# Patient Record
Sex: Male | Born: 1975
Health system: Southern US, Community
[De-identification: ages and names within clinical notes are randomized; demographics above are authoritative.]

## PROBLEM LIST (undated history)

## (undated) DIAGNOSIS — I2699 Other pulmonary embolism without acute cor pulmonale: Secondary | ICD-10-CM

## (undated) DIAGNOSIS — I82409 Acute embolism and thrombosis of unspecified deep veins of unspecified lower extremity: Secondary | ICD-10-CM

## (undated) DIAGNOSIS — M1990 Unspecified osteoarthritis, unspecified site: Secondary | ICD-10-CM

## (undated) HISTORY — PX: VENA CAVA FILTER PLACEMENT: SUR1032

## (undated) HISTORY — PX: FRACTURE SURGERY: SHX138

---

## 2013-08-06 ENCOUNTER — Emergency Department (HOSPITAL_BASED_OUTPATIENT_CLINIC_OR_DEPARTMENT_OTHER)
Admission: EM | Admit: 2013-08-06 | Discharge: 2013-08-06 | Disposition: A | Discharge status: Home / Self Care | Payer: Medicare HMO | Attending: Emergency Medicine | Admitting: Emergency Medicine

## 2013-08-06 ENCOUNTER — Emergency Department (HOSPITAL_BASED_OUTPATIENT_CLINIC_OR_DEPARTMENT_OTHER): Payer: Medicare HMO

## 2013-08-06 ENCOUNTER — Encounter (HOSPITAL_BASED_OUTPATIENT_CLINIC_OR_DEPARTMENT_OTHER): Payer: Self-pay | Admitting: Emergency Medicine

## 2013-08-06 DIAGNOSIS — W230XXA Caught, crushed, jammed, or pinched between moving objects, initial encounter: Secondary | ICD-10-CM | POA: Insufficient documentation

## 2013-08-06 DIAGNOSIS — Z86718 Personal history of other venous thrombosis and embolism: Secondary | ICD-10-CM | POA: Insufficient documentation

## 2013-08-06 DIAGNOSIS — Z7901 Long term (current) use of anticoagulants: Secondary | ICD-10-CM | POA: Insufficient documentation

## 2013-08-06 DIAGNOSIS — Z86711 Personal history of pulmonary embolism: Secondary | ICD-10-CM | POA: Insufficient documentation

## 2013-08-06 DIAGNOSIS — Y9389 Activity, other specified: Secondary | ICD-10-CM | POA: Insufficient documentation

## 2013-08-06 DIAGNOSIS — S8392XA Sprain of unspecified site of left knee, initial encounter: Secondary | ICD-10-CM

## 2013-08-06 DIAGNOSIS — F17200 Nicotine dependence, unspecified, uncomplicated: Secondary | ICD-10-CM | POA: Insufficient documentation

## 2013-08-06 DIAGNOSIS — Y929 Unspecified place or not applicable: Secondary | ICD-10-CM | POA: Insufficient documentation

## 2013-08-06 DIAGNOSIS — IMO0002 Reserved for concepts with insufficient information to code with codable children: Secondary | ICD-10-CM | POA: Insufficient documentation

## 2013-08-06 DIAGNOSIS — Z79899 Other long term (current) drug therapy: Secondary | ICD-10-CM | POA: Insufficient documentation

## 2013-08-06 HISTORY — DX: Acute embolism and thrombosis of unspecified deep veins of unspecified lower extremity: I82.409

## 2013-08-06 HISTORY — DX: Other pulmonary embolism without acute cor pulmonale: I26.99

## 2013-08-06 MED ORDER — TRAMADOL HCL 50 MG PO TABS: 50.0000 mg | ORAL_TABLET | Freq: Four times a day (QID) | ORAL | Status: DC | PRN

## 2013-08-06 NOTE — ED Notes (Signed)
Pt report getting "knee caught between a trailer and a car" "last Saturday". Here today due to "new pain"

## 2013-08-06 NOTE — ED Notes (Signed)
MD at bedside. 

## 2013-08-06 NOTE — ED Provider Notes (Signed)
CSN: [id number]     Arrival date & time 08/06/13  1825 History  This chart was scribed for [name], * by Leone Payor, ED Scribe. This patient was seen in room MH09/MH09 and the patient's care was started 7:09 PM.    Chief Complaint  Patient presents with  . Knee Injury    Left    The history is provided by the patient. No language interpreter was used.    HPI Comments: [name] is a 37 y.o. male who presents to the Emergency Department complaining of a left knee injury that occurred about 10 days ago. Pt states his knee was caught between a trailer and a car. He reports hearing a "popping" sound when the injury initially occurred. He had associated swelling to the area that subsided 3-4 days after the injury. Pt states he is able to walk, but now hears a clicking sound when he does so. He denies numbness or weakness.   Past Medical History  Diagnosis Date  . DVT (deep venous thrombosis)   . PE (pulmonary embolism)    Past Surgical History  Procedure Laterality Date  . Fracture surgery      plate on R forearm with external fixation  . Vena cava filter placement     History reviewed. No pertinent family history. History  Substance Use Topics  . Smoking status: Current Every Day Smoker -- 1.00 packs/day    Types: Cigarettes  . Smokeless tobacco: Not on file  . Alcohol Use: No    Review of Systems  Musculoskeletal: Positive for arthralgias (left lateral knee pain). Negative for joint swelling.  All other systems reviewed and are negative.    Allergies  Review of patient's allergies indicates no known allergies.  Home Medications   Current Outpatient Rx  Name  Route  Sig  Dispense  Refill  . gabapentin (NEURONTIN) 600 MG tablet   Oral   Take 600 mg by mouth 3 (three) times daily.         . methadone (DOLOPHINE) 10 MG tablet   Oral   Take 10 mg by mouth 3 (three) times daily.         Marland Kitchen warfarin (COUMADIN) 10 MG tablet   Oral   Take 10 mg by  mouth daily.          BP 145/91  Temp(Src) 99.1 F (37.3 C) (Oral)  Resp 18  Ht 6\' 3"  (1.905 m)  Wt 275 lb (124.739 kg)  BMI 34.37 kg/m2  SpO2 97% Physical Exam  Nursing note and vitals reviewed. Constitutional: He is oriented to person, place, and time. He appears well-developed and well-nourished. No distress.  HENT:  Head: Normocephalic and atraumatic.  Right Ear: Hearing normal.  Left Ear: Hearing normal.  Nose: Nose normal.  Mouth/Throat: Oropharynx is clear and moist and mucous membranes are normal.  Eyes: Conjunctivae and EOM are normal. Pupils are equal, round, and reactive to light.  Neck: Normal range of motion. Neck supple.  Cardiovascular: Regular rhythm, S1 normal and S2 normal.  Exam reveals no gallop and no friction rub.   No murmur heard. Pulmonary/Chest: Effort normal and breath sounds normal. No respiratory distress. He exhibits no tenderness.  Abdominal: Soft. Normal appearance and bowel sounds are normal. There is no hepatosplenomegaly. There is no tenderness. There is no rebound, no guarding, no tenderness at [name]'s point and negative Murphy's sign. No hernia.  Musculoskeletal: Normal range of motion. He exhibits tenderness (to the lateral aspect of the  left knee). He exhibits no edema.  No effusion, swelling, bruising, or ligamentous instability.   Neurological: He is alert and oriented to person, place, and time. He has normal strength. No cranial nerve deficit or sensory deficit. Coordination normal. GCS eye subscore is 4. GCS verbal subscore is 5. GCS motor subscore is 6.  Skin: Skin is warm, dry and intact. No rash noted. No cyanosis.  Psychiatric: He has a normal mood and affect. His speech is normal and behavior is normal. Thought content normal.    ED Course  Procedures   DIAGNOSTIC STUDIES: Oxygen Saturation is 97% on RA, normal by my interpretation.    COORDINATION OF CARE: 7:08 PM Will order XRAY of the left knee. Discussed treatment plan  with pt at bedside and pt agreed to plan.    Labs Review Labs Reviewed - No data to display Imaging Review No results found.  MDM  Diagnosis: Knee sprain, possible ligamentous injury  Patient presents to the year for evaluation of left knee pain. Patient injured his knee 10 days ago trying to push a car. He reports a large amount of swelling initially, but there is no swelling currently. Examination does not show any evidence of effusion there is no ligamentous instability. Patient will require knee immobilizer crutches, followup with orthopedics to rule out internal derangement.  I personally performed the services described in this documentation, which was scribed in my presence. The recorded information has been reviewed and is accurate.    [name] Crease, MD 08/06/13 Jerene Bears

## 2013-09-21 ENCOUNTER — Encounter (HOSPITAL_COMMUNITY): Payer: Self-pay | Admitting: Emergency Medicine

## 2013-09-21 ENCOUNTER — Emergency Department (HOSPITAL_COMMUNITY)
Admission: EM | Admit: 2013-09-21 | Discharge: 2013-09-22 | Disposition: A | Discharge status: Home / Self Care | Payer: Medicare HMO | Attending: Emergency Medicine | Admitting: Emergency Medicine

## 2013-09-21 DIAGNOSIS — Z86718 Personal history of other venous thrombosis and embolism: Secondary | ICD-10-CM | POA: Insufficient documentation

## 2013-09-21 DIAGNOSIS — I809 Phlebitis and thrombophlebitis of unspecified site: Secondary | ICD-10-CM

## 2013-09-21 DIAGNOSIS — I808 Phlebitis and thrombophlebitis of other sites: Secondary | ICD-10-CM | POA: Insufficient documentation

## 2013-09-21 DIAGNOSIS — Z86711 Personal history of pulmonary embolism: Secondary | ICD-10-CM | POA: Insufficient documentation

## 2013-09-21 DIAGNOSIS — Z79899 Other long term (current) drug therapy: Secondary | ICD-10-CM | POA: Insufficient documentation

## 2013-09-21 DIAGNOSIS — Z7901 Long term (current) use of anticoagulants: Secondary | ICD-10-CM | POA: Insufficient documentation

## 2013-09-21 DIAGNOSIS — F17200 Nicotine dependence, unspecified, uncomplicated: Secondary | ICD-10-CM | POA: Insufficient documentation

## 2013-09-21 DIAGNOSIS — Z8739 Personal history of other diseases of the musculoskeletal system and connective tissue: Secondary | ICD-10-CM | POA: Insufficient documentation

## 2013-09-21 HISTORY — DX: Unspecified osteoarthritis, unspecified site: M19.90

## 2013-09-21 MED ORDER — HYDROMORPHONE HCL PF 1 MG/ML IJ SOLN
0.5000 mg | Freq: Once | INTRAMUSCULAR | Status: AC
  Administered 2013-09-21: 0.5 mg via INTRAVENOUS
  Filled 2013-09-21: qty 1

## 2013-09-21 MED ORDER — ONDANSETRON HCL 4 MG/2ML IJ SOLN
4.0000 mg | Freq: Once | INTRAMUSCULAR | Status: AC
  Administered 2013-09-22: 4 mg via INTRAVENOUS
  Filled 2013-09-21: qty 2

## 2013-09-21 NOTE — ED Provider Notes (Signed)
CSN: [id number]     Arrival date & time 09/21/13  2142 History   First MD Initiated Contact with Patient 09/21/13 2244     Chief Complaint  Patient presents with  . Abdominal Pain  . Groin Pain   (Consider location/radiation/quality/duration/timing/severity/associated sxs/prior Treatment) HPI Comments: Mohsin Crum is a 37 y.o. male with a history of IVC (2005) due to DVT/PE who presents to the Emergency Department complaining of superficial veins that are enlarged and tender.  The veins are located in his left lower abdomen and Left groin. He reports he has had swelling to the veins for over 1 year but recently became more tender with pressure and reports erythema. Denies a recent history of trauma. He reports non compliance with his coumadin for 1 month due to insurance issues.  Denies fever or chills, nausea, or vomiting, No scrotal swelling, dysuria, hematuria.  Patient is a 37 y.o. male presenting with abdominal pain and groin pain. The history is provided by the patient. No language interpreter was used.  Abdominal Pain Pain location:  LLQ Pain quality: sharp   Pain radiates to:  Does not radiate Pain severity:  Moderate Onset quality:  Gradual Duration:  7 days Timing:  Constant Progression:  Worsening Chronicity:  Recurrent Worsened by:  Palpation and movement Associated symptoms: no chest pain, no chills, no cough and no shortness of breath   Groin Pain This is a new problem. The current episode started yesterday. The problem occurs constantly. The problem has been gradually worsening. Associated symptoms include abdominal pain. Pertinent negatives include no change in bowel habit, chest pain, chills, congestion, coughing or swollen glands. The symptoms are aggravated by walking and bending.    Past Medical History  Diagnosis Date  . DVT (deep venous thrombosis)   . PE (pulmonary embolism)   . Arthritis    Past Surgical History  Procedure Laterality Date  . Fracture  surgery      plate on R forearm with external fixation  . Vena cava filter placement     History reviewed. No pertinent family history. History  Substance Use Topics  . Smoking status: Current Every Day Smoker -- 1.00 packs/day    Types: Cigarettes  . Smokeless tobacco: Not on file  . Alcohol Use: No    Review of Systems  Constitutional: Negative for chills.  HENT: Negative for congestion.   Respiratory: Negative for cough, chest tightness, shortness of breath and wheezing.   Cardiovascular: Negative for chest pain, palpitations and leg swelling.  Gastrointestinal: Positive for abdominal pain. Negative for change in bowel habit.  Genitourinary: Negative for urgency, discharge, penile swelling, scrotal swelling, genital sores, penile pain and testicular pain.  All other systems reviewed and are negative.    Allergies  Review of patient's allergies indicates no known allergies.  Home Medications   Current Outpatient Rx  Name  Route  Sig  Dispense  Refill  . gabapentin (NEURONTIN) 600 MG tablet   Oral   Take 600 mg by mouth 3 (three) times daily.         . methadone (DOLOPHINE) 10 MG tablet   Oral   Take 20 mg by mouth 3 (three) times daily.          Marland Kitchen warfarin (COUMADIN) 10 MG tablet   Oral   Take 10 mg by mouth daily.          BP 140/79  Temp(Src) 98.3 F (36.8 C) (Oral)  Resp 22  Ht 6\' 3"  (1.905 m)  Wt 275 lb (124.739 kg)  BMI 34.37 kg/m2  SpO2 96% Physical Exam  Nursing note and vitals reviewed. Constitutional: He is oriented to person, place, and time. He appears well-developed and well-nourished. No distress.  Appears uncomfortable  HENT:  Head: Normocephalic and atraumatic.  Eyes: EOM are normal. Pupils are equal, round, and reactive to light. No scleral icterus.  Neck: Neck supple.  Cardiovascular: Normal rate, regular rhythm and normal heart sounds.   No murmur heard. Pulmonary/Chest: Effort normal and breath sounds normal. No respiratory  distress. He has no wheezes. He has no rales.  Abdominal: Soft. Bowel sounds are normal. There is tenderness in the left lower quadrant. There is no rebound and no guarding. No hernia.    Multiple LLQ raised and indurated areas. Overlying erythema.   Musculoskeletal: Normal range of motion. He exhibits no edema.  Neurological: He is alert and oriented to person, place, and time.  Skin: Skin is warm and dry. No rash noted.  Psychiatric: He has a normal mood and affect.    ED Course  Procedures (including critical care time) Labs Review Labs Reviewed - No data to display Imaging Review No results found.  EKG Interpretation    Date/Time:  Sunday September 21 2013 22:39:33 EST Ventricular Rate:  93 PR Interval:  124 QRS Duration: 91 QT Interval:  373 QTC Calculation: 464 R Axis:   -10 Text Interpretation:  Sinus rhythm Normal ECG No previous ECGs available Confirmed by [facility]  MD, JOHN ([id number]) on 09/21/2013 10:43:20 PM            MDM   1. Superficial thrombophlebitis   Pt with a history of IVC, chronic venus statis, and non compliance with anticoagulation presents with LLQ varicosities tender to palpation with overlying erythema.  Appears to be thrombophlebitis. Discussed patient history and condition with Dr. Fonnie Jarvis, after his evaluation of the patient he agrees that this is a superficial thrombophlebitis.  Will give Keflex for possible infection and warfarin. Patient is stable for discharge at this time. Meds given in ED:  Medications  HYDROmorphone (DILAUDID) injection 0.5 mg (0.5 mg Intravenous Given 09/21/13 2359)  ondansetron (ZOFRAN) injection 4 mg (4 mg Intravenous Given 09/22/13 0000)  cephALEXin (KEFLEX) capsule 1,000 mg (1,000 mg Oral Given 09/22/13 0107)  warfarin (COUMADIN) tablet 10 mg (10 mg Oral Given 09/22/13 0107)    Discharge Medication List as of 09/22/2013 12:22 AM    START taking these medications   Details  cephALEXin (KEFLEX) 500 MG capsule  Take 1 capsule (500 mg total) by mouth 4 (four) times daily., Starting 09/22/2013, Until Discontinued, Print    !! warfarin (COUMADIN) 10 MG tablet Take 1 tablet (10 mg total) by mouth daily., Starting 09/22/2013, Until Discontinued, Print     !! - Potential duplicate medications found. Please discuss with provider.          Clabe Seal, PA-C 09/23/13 1118

## 2013-09-21 NOTE — ED Notes (Signed)
Pt reports superficial clots from previous hx to ab area along with hx of DVT and PE. Pt reports pain to the groin area and feeling raised veins to groin area that has been going on for 24hours and ab pain for one week. Denies n/v or SOB.

## 2013-09-22 ENCOUNTER — Emergency Department (HOSPITAL_COMMUNITY): Payer: Medicare HMO

## 2013-09-22 ENCOUNTER — Encounter (HOSPITAL_COMMUNITY): Payer: Self-pay | Admitting: Emergency Medicine

## 2013-09-22 ENCOUNTER — Emergency Department (HOSPITAL_COMMUNITY)
Admission: EM | Admit: 2013-09-22 | Discharge: 2013-09-22 | Disposition: A | Discharge status: Home / Self Care | Payer: Medicare HMO | Attending: Emergency Medicine | Admitting: Emergency Medicine

## 2013-09-22 DIAGNOSIS — Z8739 Personal history of other diseases of the musculoskeletal system and connective tissue: Secondary | ICD-10-CM | POA: Insufficient documentation

## 2013-09-22 DIAGNOSIS — Z86711 Personal history of pulmonary embolism: Secondary | ICD-10-CM | POA: Insufficient documentation

## 2013-09-22 DIAGNOSIS — Z792 Long term (current) use of antibiotics: Secondary | ICD-10-CM | POA: Insufficient documentation

## 2013-09-22 DIAGNOSIS — I82409 Acute embolism and thrombosis of unspecified deep veins of unspecified lower extremity: Secondary | ICD-10-CM | POA: Insufficient documentation

## 2013-09-22 DIAGNOSIS — I82220 Acute embolism and thrombosis of inferior vena cava: Secondary | ICD-10-CM | POA: Insufficient documentation

## 2013-09-22 DIAGNOSIS — Z7901 Long term (current) use of anticoagulants: Secondary | ICD-10-CM | POA: Insufficient documentation

## 2013-09-22 DIAGNOSIS — Z79899 Other long term (current) drug therapy: Secondary | ICD-10-CM | POA: Insufficient documentation

## 2013-09-22 DIAGNOSIS — I82401 Acute embolism and thrombosis of unspecified deep veins of right lower extremity: Secondary | ICD-10-CM

## 2013-09-22 DIAGNOSIS — M79609 Pain in unspecified limb: Secondary | ICD-10-CM

## 2013-09-22 DIAGNOSIS — F17200 Nicotine dependence, unspecified, uncomplicated: Secondary | ICD-10-CM | POA: Insufficient documentation

## 2013-09-22 LAB — CBC
HCT: 42.9 % (ref 39.0–52.0)
Hemoglobin: 15 g/dL (ref 13.0–17.0)
MCH: 29.4 pg (ref 26.0–34.0)
MCHC: 35 g/dL (ref 30.0–36.0)
MCV: 84 fL (ref 78.0–100.0)
RBC: 5.11 MIL/uL (ref 4.22–5.81)

## 2013-09-22 LAB — POCT I-STAT, CHEM 8
BUN: 9 mg/dL (ref 6–23)
Calcium, Ion: 1.22 mmol/L (ref 1.12–1.23)
Chloride: 104 mEq/L (ref 96–112)
Creatinine, Ser: 1.3 mg/dL (ref 0.50–1.35)
Sodium: 142 mEq/L (ref 135–145)
TCO2: 25 mmol/L (ref 0–100)

## 2013-09-22 MED ORDER — IOHEXOL 300 MG/ML  SOLN
100.0000 mL | Freq: Once | INTRAMUSCULAR | Status: AC | PRN
  Administered 2013-09-22: 100 mL via INTRAVENOUS

## 2013-09-22 MED ORDER — OXYCODONE-ACETAMINOPHEN 5-325 MG PO TABS
ORAL_TABLET | ORAL | Status: AC
  Administered 2013-09-22: 11:00:00
  Filled 2013-09-22: qty 2

## 2013-09-22 MED ORDER — OXYCODONE-ACETAMINOPHEN 5-325 MG PO TABS
2.0000 | ORAL_TABLET | Freq: Once | ORAL | Status: AC
  Administered 2013-09-22: 2 via ORAL
  Filled 2013-09-22: qty 2

## 2013-09-22 MED ORDER — CEPHALEXIN 500 MG PO CAPS
1000.0000 mg | ORAL_CAPSULE | Freq: Once | ORAL | Status: AC
  Administered 2013-09-22: 1000 mg via ORAL
  Filled 2013-09-22: qty 2

## 2013-09-22 MED ORDER — WARFARIN SODIUM 10 MG PO TABS: 10.0000 mg | ORAL_TABLET | Freq: Every day | ORAL | Status: DC

## 2013-09-22 MED ORDER — WARFARIN - PHYSICIAN DOSING INPATIENT
Freq: Every day | Status: DC
Start: 2013-09-22 — End: 2013-09-22

## 2013-09-22 MED ORDER — ENOXAPARIN SODIUM 150 MG/ML ~~LOC~~ SOLN
1.0000 mg/kg | Freq: Once | SUBCUTANEOUS | Status: AC
  Administered 2013-09-22: 130 mg via SUBCUTANEOUS
  Filled 2013-09-22: qty 2

## 2013-09-22 MED ORDER — MORPHINE SULFATE 4 MG/ML IJ SOLN
6.0000 mg | Freq: Once | INTRAMUSCULAR | Status: AC
  Administered 2013-09-22: 6 mg via INTRAVENOUS
  Filled 2013-09-22: qty 2

## 2013-09-22 MED ORDER — WARFARIN SODIUM 10 MG PO TABS
10.0000 mg | ORAL_TABLET | Freq: Once | ORAL | Status: AC
  Administered 2013-09-22: 10 mg via ORAL
  Filled 2013-09-22: qty 1

## 2013-09-22 MED ORDER — ENOXAPARIN SODIUM 150 MG/ML ~~LOC~~ SOLN: 130.0000 mg | Freq: Two times a day (BID) | SUBCUTANEOUS | Status: DC

## 2013-09-22 MED ORDER — CEPHALEXIN 500 MG PO CAPS: 500.0000 mg | ORAL_CAPSULE | Freq: Four times a day (QID) | ORAL | Status: DC

## 2013-09-22 NOTE — ED Provider Notes (Addendum)
CSN: [id number]     Arrival date & time 09/22/13  4098 History   First MD Initiated Contact with Patient 09/22/13 1018     Chief Complaint  Patient presents with  . Hip Pain   HPI Patient reports worsening lower extremity swelling of his right lower extremity.  He has a history of multiple DVTs and history of pulmonary embolus.  He has IVC filter in place.  He was seen last night in emergency department for some possible thrombosed varicose veins on the left side of his abdomen.  He was prescribed Keflex and was written a prescription for Coumadin.  He states is not being compliant with his Coumadin for the last several weeks secondary to relocation to [facility] and insurance issues.  He denies chest pain shortness of breath.  No fevers or chills.  He reports no weakness or numbness of his lower extremities.  He denies swelling of his left lower extremity.    Past Medical History  Diagnosis Date  . DVT (deep venous thrombosis)   . PE (pulmonary embolism)   . Arthritis    Past Surgical History  Procedure Laterality Date  . Fracture surgery      plate on R forearm with external fixation  . Vena cava filter placement     Family History  Problem Relation Age of Onset  . Diabetes Mother   . Diabetes Father   . Deep vein thrombosis Father   . Diabetes Sister   . Diabetes Brother   . Deep vein thrombosis Brother    History  Substance Use Topics  . Smoking status: Current Every Day Smoker -- 1.00 packs/day    Types: Cigarettes  . Smokeless tobacco: Not on file  . Alcohol Use: No    Review of Systems  All other systems reviewed and are negative.    Allergies  Review of patient's allergies indicates no known allergies.  Home Medications   Current Outpatient Rx  Name  Route  Sig  Dispense  Refill  . cephALEXin (KEFLEX) 500 MG capsule   Oral   Take 1 capsule (500 mg total) by mouth 4 (four) times daily.   20 capsule   0   . gabapentin (NEURONTIN) 600 MG tablet  Oral   Take 600 mg by mouth 3 (three) times daily.         . methadone (DOLOPHINE) 10 MG tablet   Oral   Take 20 mg by mouth 3 (three) times daily.          Marland Kitchen warfarin (COUMADIN) 10 MG tablet   Oral   Take 10 mg by mouth daily.          BP 127/84  Pulse 82  Temp(Src) 98.5 F (36.9 C) (Oral)  Resp 16  Ht 6\' 3"  (1.905 m)  Wt 285 lb (129.275 kg)  BMI 35.62 kg/m2  SpO2 96% Physical Exam  Nursing note and vitals reviewed. Constitutional: He is oriented to person, place, and time. He appears well-developed and well-nourished.  HENT:  Head: Normocephalic and atraumatic.  Eyes: EOM are normal.  Neck: Normal range of motion.  Cardiovascular: Normal rate, regular rhythm, normal heart sounds and intact distal pulses.   Pulmonary/Chest: Effort normal and breath sounds normal. No respiratory distress.  Abdominal: Soft. He exhibits no distension. There is no tenderness.  Small erythema and tenderness of his left lateral lower abdomen consistent with thrombosed varicose veins.  Significant swelling of his right lower extremity as compared to his left.  Normal PT and DP pulse in his right foot. Compartments of his right lower extremity are full but not tight at all at this time.   Musculoskeletal: Normal range of motion.  Neurological: He is alert and oriented to person, place, and time.  Skin: Skin is warm and dry.  Psychiatric: He has a normal mood and affect. Judgment normal.    ED Course  Procedures (including critical care time) Labs Review Labs Reviewed  CBC - Abnormal; Notable for the following:    Platelets 134 (*)    All other components within normal limits  POCT I-STAT, CHEM 8 - Abnormal; Notable for the following:    Glucose, Bld 125 (*)    All other components within normal limits   Imaging Review Ct Abdomen Pelvis W Contrast  09/22/2013   CLINICAL DATA:  Right thigh pain and swelling. History of DVT. Evaluate for IVC clot.  EXAM: CT ABDOMEN AND PELVIS WITH  CONTRAST  TECHNIQUE: Multidetector CT imaging of the abdomen and pelvis was performed using the standard protocol following bolus administration of intravenous contrast.  CONTRAST:  OMNIPAQUE IOHEXOL 300 MG/ML  SOLN  COMPARISON:  None.  FINDINGS: Imaging through the lung bases show subsegmental atelectasis in the lower lobes bilaterally. No focal abnormality is seen in the liver or spleen. The stomach, duodenum, pancreas, gallbladder, and adrenal glands have normal imaging features. Right kidney is normal in appearance. 8 mm low-density lesion in the lower pole left kidney is likely a cyst.  No abdominal aortic aneurysm. No lymphadenopathy in the abdomen. No abdominal free fluid.  The patient is noted to have vascular collateralization in the subcutaneous soft tissues of the lower chest and abdomen, left greater than right.  Infrarenal IVC filter is identified. The IVC at the level of the filter is markedly attenuated. Both common iliac veins are also markedly attenuated.  Imaging through the pelvis shows no intraperitoneal free fluid. There is no pelvic sidewall lymphadenopathy on the left. Along the right pelvic sidewall, there is edema and inflammation in the extraperitoneal fat. There appears to be a filling defect in the right common femoral vein. There is some nodularity along the right pelvic sidewall, but I think this is related to vascular collateralization Lequita Halt and lymphadenopathy.  No colonic diverticulitis. Terminal ileum is normal. The appendix is normal.  There is some edema and inflammation in the left inguinal region overlying some prominent vascular collaterals. This is of indeterminate significance/etiology and may be apparent clinically.  Bone windows reveal no worrisome lytic or sclerotic osseous lesions.  IMPRESSION: The IVC and both common iliac veins are markedly attenuated caudal to the IVC filter, suggesting chronic thrombosis and lack of flow. The patient has prominent vascular  collateralization in the subcutaneous tissues of the abdomen, left greater than right and also in the retroperitoneal space, features which would suggest IVC chronic occlusion.  On today's study, there is some edema and inflammation in the right pelvic sidewall along what appears to be an enlarged and ill-defined right external iliac and common femoral vein. These imaging features would suggest acute thrombosis of this venous anatomy. There is prominent nodularity along the pelvic sidewalls bilaterally and in the fat of the pelvic floor, but this is felt to related to venous collateralization rather than lymphadenopathy.   Electronically Signed   By: Kennith Center M.D.   On: 09/22/2013 15:00  I personally reviewed the imaging tests through PACS system I reviewed available ER/hospitalization records through the EMR   EKG  Interpretation   None       MDM   1. 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  He has new acute DVT of his right lower extremity.  Patient be given dose of Lovenox.  Interventional radiology was consulted and I appreciate their assistance.  It was determined that at this time the patient would not necessarily benefit from direct catheter lysis.  The patient is not interested in being admitted to the hospital at this time.  He understands to return the emergency department for new or worsening symptoms.  Given his financial difficulties at this time the patient will be given a dose of Lovenox in the emergency department and case management was involved to help him with his Lovenox.  He will fill his prescription for Coumadin and his Lovenox will be filled to the medication assistance foramina of [place].  He knows they need to followup with her primary care physician for followup of his INR.  He will followup with the local urgent cares or in our emergency department until he develops a relationship with her  primary care physician.  Please see interventional radiology consult note    Lyanne Co, MD 09/22/13 1617  Lyanne Co, MD 09/22/13 [phone]

## 2013-09-22 NOTE — Progress Notes (Signed)
                  Dear _________[name] __________________:  You have been approved to have your discharge prescriptions filled through our MATCH (Medication Assistance Through [place]) program. This program allows for a one-time (no refills) 34-day supply of selected medications for a low copay amount.  The copay is $3.00 per prescription. For instance, if you have one prescription, you will pay $3.00; for two prescriptions, you pay $6.00; for three prescriptions, you pay $9.00; and so on.  Only certain pharmacies are participating in this program with [place]. You will need to select one of the pharmacies from the attached list and take your prescriptions, this letter, and your photo ID to one of the participating pharmacies.   We are excited that you are able to use the MATCH program to get your medications. These prescriptions must be filled within 7 days of hospital discharge or they will no longer be valid for the MATCH program. Should you have any problems with your prescriptions please contact your case management team member at [phone].  Thank you,   [name]   Participating MATCH Pharmacies  [facility]   [facility] [address] [place], [place]   [facility] [address] [place], [place]   [facility] [place] [facility] [address] [place], [place]   [facility]   [address], [place], [place]   [address], [place], [place]   [address], [place], [place]   [address], [place], [place]   [address], [place], [place]   [address], [place], [place]   [address], [place], [place]   [address], [place], [place]   [address], [place], [place]   [address], [place], [place]  [facility]   [address], [place], [place]   [address] [place], [place]   [address], [place], [place]   [address], [place], [place]   [address], [place], [place]   [address], [place], [place] [facility]   [address], [place], [place]   [address] [place] [address], [place], [place]   [address], [place], [place]   [address] [place] [address], [place], [place]   [address], [place], [place]   [address], [place], [place]   [address], [place], [place]   [address], [place], [place]   [address], [place], [place]   [address], [place], [place]   [address], [place], [place]   [address], [place], [place]   [address], [place], [place]   [address], [place], [place]  [facility]   [facility] [address] [place], [place]   [facility] [address] [place],    [facility] [address] [place],    For continued medication needs, please contact the [facility] at  [phone].  

## 2013-09-22 NOTE — Progress Notes (Signed)
[facility] provided pt with a list of primary care resources. Patient stated that he was pending Medicaid.  °
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
## 2013-09-22 NOTE — ED Notes (Signed)
Patient reports that he has right hip pain due to clots and now states the pain is radiating into the right groin area. Patient was seen in the ED for the same. Patient reports that his right thigh is swollen now.

## 2013-09-22 NOTE — ED Provider Notes (Signed)
Medical screening examination/treatment/procedure(s) were conducted as a shared visit with non-physician practitioner(s) and myself.  I personally evaluated the patient during the encounter.  EKG Interpretation    Date/Time:  Sunday September 21 2013 22:39:33 EST Ventricular Rate:  93 PR Interval:  124 QRS Duration: 91 QT Interval:  373 QTC Calculation: 464 R Axis:   -10 Text Interpretation:  Sinus rhythm Normal ECG No previous ECGs available Confirmed by [facility]  MD, Jonny Ruiz [phone]) on 09/21/2013 10:43:20 PM           Chronic superficial varicose veins left lower abdominal wall, ran out of Coumadin a month ago, would like to restart 10 mg daily Coumadin, does not want Lovenox, has several days of new redness warmth tenderness with examination suggestive of superficial thrombophlebitis with possible localized mild cellulitis without fluctuance, purulent drainage, or suspected abscess. has IVC filter, is supposed to be on lifetime anticoagulation.  Hurman Horn, MD 09/24/13 [phone]

## 2013-09-22 NOTE — Progress Notes (Signed)
   CARE MANAGEMENT ED NOTE 09/22/2013  Patient:  [name], [name]   Account Number:  [id number]  Date Initiated:  09/22/2013  Documentation initiated by:  Edd Arbour  Subjective/Objective Assessment:   37 yr old self pay pt from [place] recently moved to [facility] with 3 ED visits since 07/2013 Pt states he will receive medicare/medicaid  coverage effective 10/2013 reports having medicaid previously     Subjective/Objective Assessment Detail:     Action/Plan:   cm spoke with EDP, Campos about CM consult for lovenox assistance through [facility] program Reviewed EPIC notes see match letter in EPIc   Action/Plan Detail:   Anticipated DC Date:  09/22/2013     Status Recommendation to Physician:   Result of Recommendation:    Other ED Services  Consult Working Plan    DC Planning Services  Other  PCP issues  GCCN / P4HM (established/new)  CM consult  Other  Outpatient Services - Pt will follow up  Medication Assistance  MATCH Program    Choice offered to / List presented to:            Status of service:  Completed, signed off  ED Comments:  Cristy Friedlander  Signed  Progress Notes Service date: 09/22/2013 4:08 PM [facility] CL provided pt with a list of primary care resources. Patient stated that he was pending Medicaid.   ED Comments Detail:  CM reviewed EPIC notes and chart review information CM spoke with the pt about [facility] MATCH program ($3 co pay for each Rx through [facility] program, does not include refills, 7 day expiration of MATCH letter and choice of pharmacies) Pt agreed to receive assistance from program CM spoke with pt who confirms self pay [facility] resident with no pcp. CM discussed and provided written information for self pay pcps, importance of pcp for f/u care, www.needymeds.org, discounted pharmacies and other [place] resources such as financial assistance, DSS and  health department  Reviewed resources for [employer] self pay pcps like [employer], family medicine at [address], [facility] family practice, general medical clinics, [facility] urgent care plus others, [facility], housing, and other resources in [employer]. Pt voiced understanding and appreciation of resources provided Pt is eligible for [facility] MATCH program (unable to find pt listed in PDMI per cardholder name inquiry) PDMI information entered. MATCH letter completed and provided to pt. CM updated EDP and ED RN

## 2013-09-22 NOTE — Progress Notes (Signed)
*  PRELIMINARY RESULTS* Vascular Ultrasound Lower extremity venous duplex has been completed.  Preliminary findings:  RIGHT= Acute occlusive DVT involving the common femoral, femoral, profunda femoral, popliteal, posterior tibial, and peroneal veins. No venous flow noted.  LEFT = no evidence of DVT. There is a small area of mixed echoes in the popliteal fossa, this is most likely superficial thrombus of varicosities vs atypical baker's cyst.   Farrel Demark, RDMS, RVT  09/22/2013, 11:43 AM

## 2013-09-26 ENCOUNTER — Emergency Department (HOSPITAL_COMMUNITY)
Admission: EM | Admit: 2013-09-26 | Discharge: 2013-09-26 | Disposition: A | Discharge status: Home / Self Care | Payer: Medicare HMO | Attending: Emergency Medicine | Admitting: Emergency Medicine

## 2013-09-26 ENCOUNTER — Encounter (HOSPITAL_COMMUNITY): Payer: Self-pay | Admitting: Emergency Medicine

## 2013-09-26 DIAGNOSIS — I82401 Acute embolism and thrombosis of unspecified deep veins of right lower extremity: Secondary | ICD-10-CM

## 2013-09-26 DIAGNOSIS — I82409 Acute embolism and thrombosis of unspecified deep veins of unspecified lower extremity: Secondary | ICD-10-CM | POA: Insufficient documentation

## 2013-09-26 DIAGNOSIS — R109 Unspecified abdominal pain: Secondary | ICD-10-CM | POA: Insufficient documentation

## 2013-09-26 DIAGNOSIS — Z7901 Long term (current) use of anticoagulants: Secondary | ICD-10-CM | POA: Insufficient documentation

## 2013-09-26 DIAGNOSIS — M129 Arthropathy, unspecified: Secondary | ICD-10-CM | POA: Insufficient documentation

## 2013-09-26 DIAGNOSIS — R079 Chest pain, unspecified: Secondary | ICD-10-CM | POA: Insufficient documentation

## 2013-09-26 DIAGNOSIS — Z86711 Personal history of pulmonary embolism: Secondary | ICD-10-CM | POA: Insufficient documentation

## 2013-09-26 DIAGNOSIS — F17200 Nicotine dependence, unspecified, uncomplicated: Secondary | ICD-10-CM | POA: Insufficient documentation

## 2013-09-26 DIAGNOSIS — Z79899 Other long term (current) drug therapy: Secondary | ICD-10-CM | POA: Insufficient documentation

## 2013-09-26 LAB — COMPREHENSIVE METABOLIC PANEL
ALT: 42 U/L (ref 0–53)
AST: 39 U/L — ABNORMAL HIGH (ref 0–37)
Albumin: 3.8 g/dL (ref 3.5–5.2)
CO2: 25 mEq/L (ref 19–32)
Calcium: 9.3 mg/dL (ref 8.4–10.5)
Chloride: 101 mEq/L (ref 96–112)
Creatinine, Ser: 0.96 mg/dL (ref 0.50–1.35)
GFR calc non Af Amer: >90 mL/min (ref >90)
Glucose, Bld: 143 mg/dL — ABNORMAL HIGH (ref 70–99)
Sodium: 137 mEq/L (ref 135–145)

## 2013-09-26 LAB — CBC WITH DIFFERENTIAL/PLATELET
Basophils Absolute: 0 10*3/uL (ref 0.0–0.1)
Basophils Relative: 1 % (ref 0–1)
Eosinophils Relative: 3 % (ref 0–5)
HCT: 44.9 % (ref 39.0–52.0)
Hemoglobin: 16 g/dL (ref 13.0–17.0)
Lymphocytes Relative: 18 % (ref 12–46)
MCHC: 35.6 g/dL (ref 30.0–36.0)
Monocytes Absolute: 0.4 10*3/uL (ref 0.1–1.0)
Neutro Abs: 4.3 10*3/uL (ref 1.7–7.7)
Platelets: 153 10*3/uL (ref 150–400)
RDW: 12.2 % (ref 11.5–15.5)
WBC: 6.1 10*3/uL (ref 4.0–10.5)

## 2013-09-26 LAB — PROTIME-INR: INR: 1.32 (ref 0.00–1.49)

## 2013-09-26 MED ORDER — MORPHINE SULFATE 4 MG/ML IJ SOLN: 4.0000 mg | Freq: Once | INTRAMUSCULAR | Status: DC

## 2013-09-26 MED ORDER — MORPHINE SULFATE 4 MG/ML IJ SOLN
8.0000 mg | Freq: Once | INTRAMUSCULAR | Status: AC
  Administered 2013-09-26: 8 mg via INTRAVENOUS
  Filled 2013-09-26: qty 2

## 2013-09-26 MED ORDER — OXYCODONE-ACETAMINOPHEN 5-325 MG PO TABS: 1.0000 | ORAL_TABLET | ORAL | Status: DC | PRN

## 2013-09-26 MED ORDER — ENOXAPARIN SODIUM 150 MG/ML ~~LOC~~ SOLN: 130.0000 mg | Freq: Two times a day (BID) | SUBCUTANEOUS | Status: DC

## 2013-09-26 MED ORDER — WARFARIN SODIUM 10 MG PO TABS: 15.0000 mg | ORAL_TABLET | Freq: Every day | ORAL | Status: DC

## 2013-09-26 NOTE — ED Provider Notes (Signed)
CSN: [id number]     Arrival date & time 09/26/13  1129 History   First MD Initiated Contact with Patient 09/26/13 1215     Chief Complaint  Patient presents with  . Chest Pain  . Leg Swelling   (Consider location/radiation/quality/duration/timing/severity/associated sxs/prior Treatment) Patient is a 37 y.o. male presenting with chest pain.  Chest Pain Associated symptoms: abdominal pain   Associated symptoms: no diaphoresis, no dizziness, no fatigue, no fever, no headache, no nausea, no numbness, no palpitations and not vomiting     [name] is a 37 y.o. man who presents with worsening right leg swelling and pain. He has been seen twice in teh last 10 days in the ED. Initially he was seen for phlebitis of the left abdominal wall. Next he was seen on Monday for right leg swelling. He was diagnsoed with an acute right leg DVT. IR evaluated for potential admission for directed lytic therapy. Further imaging revealed a chronically occluded IVC filter. At that point the patient elected to be treated as outpatient with Lovenox and coumadin. He was instructed to return if symptoms worsen. His right leg has increased swelling and pain. The pain has extended up to his hip. He denies SOB and chest pain.    Past Medical History  Diagnosis Date  . DVT (deep venous thrombosis)   . PE (pulmonary embolism)   . Arthritis    Past Surgical History  Procedure Laterality Date  . Fracture surgery      plate on R forearm with external fixation  . Vena cava filter placement     Family History  Problem Relation Age of Onset  . Diabetes Mother   . Diabetes Father   . Deep vein thrombosis Father   . Diabetes Sister   . Diabetes Brother   . Deep vein thrombosis Brother    History  Substance Use Topics  . Smoking status: Current Every Day Smoker -- 1.00 packs/day    Types: Cigarettes  . Smokeless tobacco: Not on file  . Alcohol Use: No    Review of Systems  Constitutional: Negative for  fever, diaphoresis and fatigue.  Cardiovascular: Positive for leg swelling. Negative for chest pain and palpitations.  Gastrointestinal: Positive for abdominal pain. Negative for nausea, vomiting, diarrhea, constipation and abdominal distention.  Genitourinary: Negative for dysuria, frequency, hematuria and flank pain.  Neurological: Negative for dizziness, light-headedness, numbness and headaches.    Allergies  Review of patient's allergies indicates no known allergies.  Home Medications   Current Outpatient Rx  Name  Route  Sig  Dispense  Refill  . cephALEXin (KEFLEX) 500 MG capsule   Oral   Take 1 capsule (500 mg total) by mouth 4 (four) times daily.   20 capsule   0   . enoxaparin (LOVENOX) 150 MG/ML injection   Subcutaneous   Inject 0.87 mLs (130 mg total) into the skin every 12 (twelve) hours.   12 Syringe   0   . gabapentin (NEURONTIN) 600 MG tablet   Oral   Take 600 mg by mouth 3 (three) times daily.         . methadone (DOLOPHINE) 10 MG tablet   Oral   Take 20 mg by mouth 3 (three) times daily.          Marland Kitchen warfarin (COUMADIN) 10 MG tablet   Oral   Take 10 mg by mouth daily.          BP 138/91  Pulse 106  Temp(Src) 98.3  F (36.8 C) (Oral)  Resp 14  SpO2 99% Physical Exam  Constitutional: He is oriented to person, place, and time. He appears well-developed and well-nourished. No distress.  Pulmonary/Chest: No respiratory distress. He has no wheezes. He has no rales. He exhibits no tenderness.  Abdominal: Soft. Bowel sounds are normal. He exhibits no distension. There is no tenderness. There is no rebound and no guarding.  Musculoskeletal: He exhibits edema and tenderness.  Increased swelling of right leg to hip. Intact to sensation in bilateral legs. DP pulse is 2+ in right leg. Cap refill is normal. Major muscles of the LE are 5/5.  Neurological: He is alert and oriented to person, place, and time.  Skin: He is not diaphoretic.  Improved rash on left  side of abdomen.  Psychiatric: He has a normal mood and affect. His behavior is normal.    ED Course  Procedures (including critical care time) Labs Review Labs Reviewed  PROTIME-INR - Abnormal; Notable for the following:    Prothrombin Time 16.1 (*)    All other components within normal limits  COMPREHENSIVE METABOLIC PANEL - Abnormal; Notable for the following:    Glucose, Bld 143 (*)    AST 39 (*)    All other components within normal limits  CBC WITH DIFFERENTIAL   Imaging Review No results found.  EKG Interpretation   None       MDM   1. DVT (deep venous thrombosis), right     1. LE DVT (Acute right)  The patient has continued pain and discomfort in his right LE. It appears stable from previous. Patient responded to IV morphine with good pain relief. The ED team consulted IR regarding possible directed lytics. Due to the presence of a chronically occluded IVC filter, the IR team recommended against admission for directed lytics. The patient was informed of this and agreed with the plan for continued treatment with coumadin. As the patient was sub therapeutic on coumadin, I spoke with the on call pharmacist who recommended increasing his coumadin for 3 days (from 10 mg qd to 15 mg qd). The patient was instructed to have INR checked on Monday Sep 29, 2013.  Pleas Koch, MD 09/26/13 1450  Pleas Koch, MD 09/26/13 1451

## 2013-09-26 NOTE — ED Provider Notes (Signed)
I saw and evaluated the patient, reviewed the resident's note and I agree with the findings and plan.  EKG Interpretation   None      Pt returns as directed by Rads since right leg not improving, known DVT acute right leg, recent left lower abdomen superficial thrombophlebitis erythema resolved, right leg DP pulse intact, CR<2secs all toes, normal LT, good movement, tender calf and thigh, mild edema, IRads notified.  D/w IR who states Pt not candidate for intervention. D/w Pharm and Care Mgmt for med dosing.  Hurman Horn, MD 09/26/13 2011

## 2013-09-26 NOTE — Progress Notes (Signed)
                  Dear _________[name] __________________:  Bonita Quin have been approved to have your discharge prescriptions filled through our [facility] (Medication Assistance Through [facility]) program. This program allows for a one-time (no refills) 34-day supply of selected medications for a low copay amount.  The copay is $3.00 per prescription. For instance, if you have one prescription, you will pay $3.00; for two prescriptions, you pay $6.00; for three prescriptions, you pay $9.00; and so on.  Only certain pharmacies are participating in this program with [facility]. You will need to select one of the pharmacies from the attached list and take your prescriptions, this letter, and your photo ID to one of the participating pharmacies.   We are excited that you are able to use the [facility] program to get your medications. These prescriptions must be filled within 7 days of hospital discharge or they will no longer be valid for the [facility] program. Should you have any problems with your prescriptions please contact your case management team member at [phone].  Thank you,   [employer] [facility]   Participating [facility]  [facility]   [facility] [address] [place], [place]   [facility] [address] [place], [place]   [facility] [address] [place], [place]   [facility]   [address] [place], [place]   [address], [place], [place]   [address], [place], [place]   [address], [place], [place]   [address], [place], [place]   [address], [place], [place]   [address], [place], [place]   [address] [place], [place]   [address], [place], [place]   [address] [place], [place], [place]  [facility]   [address], [place], [place]   [address] [place] [place], [place]   [address] [place], [address], [place]   [address], [place], [place]   [address] [place], [place]   [address] [place] [address] [place], [place], [place] [facility]   [address] [place], [place]   [address], [place], [place]   [address], [place], [place]   [address], [place], [place]   [address], [place], [place]   [address] [place], [place]   [address] [place], [place]   [address], [place], [place]   [address], [place], [place]   [address], [place], [place]   [address], [place], [place]   [address] [place], [place]   [address] [place], [place]   [address] [place], [place], [place]  Independent Pharmacies   [facility] [address] [place], [address] [place], [place]   [facility] [address] [place], [place]   [facility] [address] [place], [place]   For continued medication needs, please contact the [facility] Department at  [phone].

## 2013-09-26 NOTE — ED Notes (Addendum)
Pt presents with right leg swelling. Pt was recently seen in ED for DVT, which the patient reports has worsened since being seen. Pt reports the discharge plan was to monitor symptoms while taking blood thinners. Pt reports he was to report back if the swelling and pain did not improve. Pt has a history of PE and multiple DVT since 2005. Pt also reports shortness of breath and intermittent left sided chest pain. Pt reports having a IVC filter in place since 2005.

## 2013-09-26 NOTE — Progress Notes (Signed)
   CARE MANAGEMENT ED NOTE 09/26/2013  Patient:  [name],[name]   Account Number:  [id number]  Date Initiated:  09/26/2013  Documentation initiated by:  Edd Arbour  Subjective/Objective Assessment:   37 year old self pay [facility] resident with PMH IVC filter Pt with worsening swelling of legs  Pt confirms he still has not obtained a pcp but will be having INR/PT check on "Wednesday" at "an "urgent care"     Subjective/Objective Assessment Detail:   EDP states lovenox dosages to be extended until INR check completed then pt will remain on coumadin if labs appropriate     Action/Plan:   CM spoke with ED resident/EDP-Bednar, CM MATCH staff members x 2 Kennon Rounds, Arline Asp), spoke with pt   Action/Plan Detail:   Anticipated DC Date:  09/26/2013     Status Recommendation to Physician:   Result of Recommendation:    Other ED Services  Consult Working Plan    DC Planning Services  Other  Medication Assistance  MATCH Program  Outpatient Services - Pt will follow up    Choice offered to / List presented to:            Status of service:  Completed, signed off  ED Comments:   ED Comments Detail:  ED CM consulted by EDP, Bednar and resident, Pleas Koch for medication assistance CM reviewed EPIC notes and chart review information CM spoke with the pt about [facility] MATCH program ($3 co pay for each Rx through [facility] program, does not include refills, 7 day expiration of MATCH letter and choice of pharmacies) Pt agreed to receive assistance from program CM reviewed information for self pay pcps, importance of pcp for f/u care, www.needymeds.org, discounted pharmacies and other [employer] such as financial assistance, DSS and  health department  Reviewed resources for [employer] self pay pcps like [facility], family medicine at [address], [facility] family practice, general medical clinics, [facility] urgent care plus others, [facility] out [facility], housing, and other  resources in [employer]. Pt voiced understanding and appreciation of resources provided Pt states he will have coverage in 2015 (pending medicaid change from [place])  PDMI information updated. MATCH letter completed and provided to pt. Encouraged pt to contact CM office number listed on [facility] letter

## 2014-03-06 ENCOUNTER — Emergency Department (HOSPITAL_COMMUNITY)
Admission: EM | Admit: 2014-03-06 | Discharge: 2014-03-06 | Disposition: A | Discharge status: Home / Self Care | Payer: Medicare HMO | Attending: Emergency Medicine | Admitting: Emergency Medicine

## 2014-03-06 ENCOUNTER — Encounter (HOSPITAL_COMMUNITY): Payer: Self-pay | Admitting: Emergency Medicine

## 2014-03-06 DIAGNOSIS — Z792 Long term (current) use of antibiotics: Secondary | ICD-10-CM | POA: Insufficient documentation

## 2014-03-06 DIAGNOSIS — M129 Arthropathy, unspecified: Secondary | ICD-10-CM | POA: Insufficient documentation

## 2014-03-06 DIAGNOSIS — I82401 Acute embolism and thrombosis of unspecified deep veins of right lower extremity: Secondary | ICD-10-CM

## 2014-03-06 DIAGNOSIS — Z7901 Long term (current) use of anticoagulants: Secondary | ICD-10-CM | POA: Insufficient documentation

## 2014-03-06 DIAGNOSIS — F17200 Nicotine dependence, unspecified, uncomplicated: Secondary | ICD-10-CM | POA: Insufficient documentation

## 2014-03-06 DIAGNOSIS — Z86711 Personal history of pulmonary embolism: Secondary | ICD-10-CM | POA: Insufficient documentation

## 2014-03-06 DIAGNOSIS — Z79899 Other long term (current) drug therapy: Secondary | ICD-10-CM | POA: Insufficient documentation

## 2014-03-06 DIAGNOSIS — I824Y9 Acute embolism and thrombosis of unspecified deep veins of unspecified proximal lower extremity: Secondary | ICD-10-CM | POA: Insufficient documentation

## 2014-03-06 DIAGNOSIS — M79609 Pain in unspecified limb: Secondary | ICD-10-CM

## 2014-03-06 DIAGNOSIS — M7989 Other specified soft tissue disorders: Secondary | ICD-10-CM

## 2014-03-06 NOTE — Discharge Instructions (Signed)
1. Medications: usual home medications 2. Treatment: rest, drink plenty of fluids, warm compresses as needed 3. Follow Up: Please followup with Dr. Jacolyn [facility]edyMarston at [facility] this week for further evaluation of your new DVT and discussion of your anticoagulant   Deep Vein Thrombosis A deep vein thrombosis (DVT) is a blood clot that develops in the deep, larger veins of the leg, arm, or pelvis. These are more dangerous than clots that might form in veins near the surface of the body. A DVT can lead to complications if the clot breaks off and travels in the bloodstream to the lungs.  A DVT can damage the valves in your leg veins, so that instead of flowing upward, the blood pools in the lower leg. This is called post-thrombotic syndrome, and it can result in pain, swelling, discoloration, and sores on the leg. CAUSES Usually, several things contribute to blood clots forming. Contributing factors include:  The flow of blood slows down.  The inside of the vein is damaged in some way.  You have a condition that makes blood clot more easily. RISK FACTORS Some people are more likely than others to develop blood clots. Risk factors include:   Older age, especially over 39 years of age.  Having a family history of blood clots or if you have already had a blot clot.  Having major or lengthy surgery. This is especially true for surgery on the hip, knee, or belly (abdomen). Hip surgery is particularly high risk.  Breaking a hip or leg.  Sitting or lying still for a long time. This includes long-distance travel, paralysis, or recovery from an illness or surgery.  Having cancer or cancer treatment.  Having a long, thin tube (catheter) placed inside a vein during a medical procedure.  Being overweight (obese).  Pregnancy and childbirth.  Hormone changes make the blood clot more easily during pregnancy.  The fetus puts pressure on the veins of the pelvis.  There is a risk of injury to veins during  delivery or a caesarean. The risk is highest just after childbirth.  Medicines with the male hormone estrogen. This includes birth control pills and hormone replacement therapy.  Smoking.  Other circulation or heart problems.  SIGNS AND SYMPTOMS When a clot forms, it can either partially or totally block the blood flow in that vein. Symptoms of a DVT can include:  Swelling of the leg or arm, especially if one side is much worse.  Warmth and redness of the leg or arm, especially if one side is much worse.  Pain in an arm or leg. If the clot is in the leg, symptoms may be more noticeable or worse when standing or walking. The symptoms of a DVT that has traveled to the lungs (pulmonary embolism, PE) usually start suddenly and include:  Shortness of breath.  Coughing.  Coughing up blood or blood-tinged phlegm.  Chest pain. The chest pain is often worse with deep breaths.  Rapid heartbeat. Anyone with these symptoms should get emergency medical treatment right away. Call your local emergency services (911 in the [place] if you have these symptoms. DIAGNOSIS If a DVT is suspected, your health care provider will take a full medical history and perform a physical exam. Tests that also may be required include:  Blood tests, including studies of the clotting properties of the blood.  Ultrasonography to see if you have clots in your legs or lungs.  X-rays to show the flow of blood when dye is injected into the veins (  venography).  Studies of your lungs if you have any chest symptoms. PREVENTION  Exercise the legs regularly. Take a brisk 30-minute walk every day.  Maintain a weight that is appropriate for your height.  Avoid sitting or lying in bed for long periods of time without moving your legs.  Women, particularly those over the age of 35 years, should consider the risks and benefits of taking estrogen medicines, including birth control pills.  Do not smoke, especially if you  take estrogen medicines.  Long-distance travel can increase your risk of DVT. You should exercise your legs by walking or pumping the muscles every hour.  In-hospital prevention:  Many of the risk factors above relate to situations that exist with hospitalization, either for illness, injury, or elective surgery.  Your health care provider will assess you for the need for venous thromboembolism prophylaxis when you are admitted to the hospital. If you are having surgery, your surgeon will assess you the day of or day after surgery.  Prevention may include medical and nonmedical measures. TREATMENT Once identified, a DVT can be treated. It can also be prevented in some circumstances. Once you have had a DVT, you may be at increased risk for a DVT in the future. The most common treatment for DVT is blood thinning (anticoagulant) medicine, which reduces the blood's tendency to clot. Anticoagulants can stop new blood clots from forming and stop old ones from growing. They cannot dissolve existing clots. Your body does this by itself over time. Anticoagulants can be given by mouth, by IV access, or by injection. Your health care provider will determine the best program for you. Other medicines or treatments that may be used are:  Heparin or related medicines (low molecular weight heparin) are usually the first treatment for a blood clot. They act quickly. However, they cannot be taken orally.  Heparin can cause a fall in a component of blood that stops bleeding and forms blood clots (platelets). You will be monitored with blood tests to be sure this does not occur.  Warfarin is an anticoagulant that can be swallowed. It takes a few days to start working, so usually heparin or related medicines are used in combination. Once warfarin is working, heparin is usually stopped.  Less commonly, clot dissolving drugs (thrombolytics) are used to dissolve a DVT. They carry a high risk of bleeding, so they are  used mainly in severe cases, where your life or a limb is threatened.  Very rarely, a blood clot in the leg needs to be removed surgically.  If you are unable to take anticoagulants, your health care provider may arrange for you to have a filter placed in a main vein in your abdomen. This filter prevents clots from traveling to your lungs. HOME CARE INSTRUCTIONS  Take all medicines prescribed by your health care provider. Only take over-the-counter or prescription medicines for pain, fever, or discomfort as directed by your health care provider.  Warfarin. Most people will continue taking warfarin after hospital discharge. Your health care provider will advise you on the length of treatment (usually 3 6 months, sometimes lifelong).  Too much and too little warfarin are both dangerous. Too much warfarin increases the risk of bleeding. Too little warfarin continues to allow the risk for blood clots. While taking warfarin, you will need to have regular blood tests to measure your blood clotting time. These blood tests usually include both the prothrombin time (PT) and international normalized ratio (INR) tests. The PT and INR results  allow your health care provider to adjust your dose of warfarin. The dose can change for many reasons. It is critically important that you take warfarin exactly as prescribed, and that you have your PT and INR levels drawn exactly as directed.  Many foods, especially foods high in vitamin K, can interfere with warfarin and affect the PT and INR results. Foods high in vitamin K include spinach, kale, broccoli, cabbage, collard and turnip greens, brussel sprouts, peas, cauliflower, seaweed, and parsley as well as beef and pork liver, green tea, and soybean oil. You should eat a consistent amount of foods high in vitamin K. Avoid major changes in your diet, or notify your health care provider before changing your diet. Arrange a visit with a dietitian to answer your  questions.  Many medicines can interfere with warfarin and affect the PT and INR results. You must tell your health care provider about any and all medicines you take. This includes all vitamins and supplements. Be especially cautious with aspirin and anti-inflammatory medicines. Ask your health care provider before taking these. Do not take or discontinue any prescribed or over-the-counter medicine except on the advice of your health care provider or pharmacist.  Warfarin can have side effects, primarily excessive bruising or bleeding. You will need to hold pressure over cuts for longer than usual. Your health care provider or pharmacist will discuss other potential side effects.  Alcohol can change the body's ability to handle warfarin. It is best to avoid alcoholic drinks or consume only very small amounts while taking warfarin. Notify your health care provider if you change your alcohol intake.  Notify your dentist or other health care providers before procedures.  Activity. Ask your health care provider how soon you can go back to normal activities. It is important to stay active to prevent blood clots. If you are on anticoagulant medicine, avoid contact sports.  Exercise. It is very important to exercise. This is especially important while traveling, sitting, or standing for long periods of time. Exercise your legs by walking or by pumping the muscles frequently. Take frequent walks.  Compression stockings. These are tight elastic stockings that apply pressure to the lower legs. This pressure can help keep the blood in the legs from clotting. You may need to wear compression stockings at home to help prevent a DVT.  Do not smoke. If you smoke, quit. Ask your health care provider for help with quitting smoking.  Learn as much as you can about DVT. Knowing more about the condition should help you keep it from coming back.  Wear a medical alert bracelet or carry a medical alert card. SEEK  MEDICAL CARE IF:  You notice a rapid heartbeat.  You feel weaker or more tired than usual.  You feel faint.  You notice increased bruising.  You feel your symptoms are not getting better in the time expected.  You believe you are having side effects of medicine. SEEK IMMEDIATE MEDICAL CARE IF:  You have chest pain.  You have trouble breathing.  You have new or increased swelling or pain in one leg.  You cough up blood.  You notice blood in vomit, in a bowel movement, or in urine. MAKE SURE YOU:  Understand these instructions.  Will watch your condition.  Will get help right away if you are not doing well or get worse. Document Released: 10/16/2005 Document Revised: 08/06/2013 Document Reviewed: 06/23/2013 [facility] Patient Information 2014 [place], [place].
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
## 2014-03-06 NOTE — ED Notes (Signed)
Venous duplex at bedside.

## 2014-03-06 NOTE — Progress Notes (Signed)
Right lower extremity venous duplex completed.  Right:  DVT noted still noted in the saphenofemoral junction, common femoral, profunda, femoral, and popliteal veins.  The DVT in the posterior tibial and peroneal veins noted 09-22-13 appear resolved.  No evidence of superficial thrombosis.  No Baker's cyst.  Left:  Negative for DVT in the common femoral vein.

## 2014-03-06 NOTE — ED Notes (Signed)
Pt states he has hx of DVT.  Pt states he believes he has DVT in his R hip based on how he feels.  Pt reports R hip, leg pain and swelling.

## 2014-03-06 NOTE — ED Notes (Signed)
Pt states he takes Xarelto currently.

## 2014-03-07 NOTE — ED Provider Notes (Signed)
Medical screening examination/treatment/procedure(s) were performed by non-physician practitioner and as supervising physician I was immediately available for consultation/collaboration.   EKG Interpretation None       Daveah Varone, MD 03/07/14 0051 

## 2014-04-16 ENCOUNTER — Ambulatory Visit
Admission: RE | Admit: 2014-04-16 | Discharge: 2014-04-16 | Disposition: A | Discharge status: Home / Self Care | Payer: Medicare HMO | Source: Ambulatory Visit | Attending: Physician Assistant | Admitting: Physician Assistant

## 2014-04-16 ENCOUNTER — Other Ambulatory Visit: Payer: Self-pay | Admitting: Physician Assistant

## 2014-04-16 DIAGNOSIS — M25521 Pain in right elbow: Secondary | ICD-10-CM

## 2014-04-16 DIAGNOSIS — M25561 Pain in right knee: Secondary | ICD-10-CM

## 2014-04-16 DIAGNOSIS — M25562 Pain in left knee: Principal | ICD-10-CM

## 2014-09-10 ENCOUNTER — Emergency Department (HOSPITAL_COMMUNITY)
Admission: EM | Admit: 2014-09-10 | Discharge: 2014-09-10 | Disposition: A | Discharge status: Home / Self Care | Payer: Medicare HMO | Attending: Emergency Medicine | Admitting: Emergency Medicine

## 2014-09-10 ENCOUNTER — Emergency Department (HOSPITAL_COMMUNITY): Payer: Medicare HMO

## 2014-09-10 ENCOUNTER — Encounter (HOSPITAL_COMMUNITY): Payer: Self-pay | Admitting: Emergency Medicine

## 2014-09-10 DIAGNOSIS — Z86711 Personal history of pulmonary embolism: Secondary | ICD-10-CM | POA: Diagnosis not present

## 2014-09-10 DIAGNOSIS — M1990 Unspecified osteoarthritis, unspecified site: Secondary | ICD-10-CM | POA: Diagnosis not present

## 2014-09-10 DIAGNOSIS — L97929 Non-pressure chronic ulcer of unspecified part of left lower leg with unspecified severity: Secondary | ICD-10-CM

## 2014-09-10 DIAGNOSIS — Z7901 Long term (current) use of anticoagulants: Secondary | ICD-10-CM | POA: Insufficient documentation

## 2014-09-10 DIAGNOSIS — Z79899 Other long term (current) drug therapy: Secondary | ICD-10-CM | POA: Insufficient documentation

## 2014-09-10 DIAGNOSIS — M79662 Pain in left lower leg: Secondary | ICD-10-CM | POA: Diagnosis present

## 2014-09-10 DIAGNOSIS — L97923 Non-pressure chronic ulcer of unspecified part of left lower leg with necrosis of muscle: Secondary | ICD-10-CM | POA: Insufficient documentation

## 2014-09-10 DIAGNOSIS — Z720 Tobacco use: Secondary | ICD-10-CM | POA: Insufficient documentation

## 2014-09-10 DIAGNOSIS — Z86718 Personal history of other venous thrombosis and embolism: Secondary | ICD-10-CM | POA: Diagnosis not present

## 2014-09-10 DIAGNOSIS — Z792 Long term (current) use of antibiotics: Secondary | ICD-10-CM | POA: Insufficient documentation

## 2014-09-10 DIAGNOSIS — M79606 Pain in leg, unspecified: Secondary | ICD-10-CM

## 2014-09-10 LAB — BASIC METABOLIC PANEL
ANION GAP: 11 (ref 5–15)
BUN: 15 mg/dL (ref 6–23)
CALCIUM: 9.3 mg/dL (ref 8.4–10.5)
CO2: 26 mEq/L (ref 19–32)
Chloride: 101 mEq/L (ref 96–112)
Creatinine, Ser: 1.09 mg/dL (ref 0.50–1.35)
GFR, EST NON AFRICAN AMERICAN: 84 mL/min — AB (ref >90)
Glucose, Bld: 131 mg/dL — ABNORMAL HIGH (ref 70–99)
Potassium: 4.2 mEq/L (ref 3.7–5.3)
Sodium: 138 mEq/L (ref 137–147)

## 2014-09-10 LAB — CBC WITH DIFFERENTIAL/PLATELET
BASOS ABS: 0 10*3/uL (ref 0.0–0.1)
BASOS PCT: 0 % (ref 0–1)
Eosinophils Absolute: 0.1 10*3/uL (ref 0.0–0.7)
Eosinophils Relative: 2 % (ref 0–5)
HCT: 43.4 % (ref 39.0–52.0)
Hemoglobin: 15.4 g/dL (ref 13.0–17.0)
Lymphocytes Relative: 15 % (ref 12–46)
Lymphs Abs: 1 10*3/uL (ref 0.7–4.0)
MCH: 29.6 pg (ref 26.0–34.0)
MCHC: 35.5 g/dL (ref 30.0–36.0)
MCV: 83.5 fL (ref 78.0–100.0)
Monocytes Absolute: 0.5 10*3/uL (ref 0.1–1.0)
Monocytes Relative: 7 % (ref 3–12)
NEUTROS ABS: 5.1 10*3/uL (ref 1.7–7.7)
Neutrophils Relative %: 76 % (ref 43–77)
PLATELETS: 135 10*3/uL — AB (ref 150–400)
RBC: 5.2 MIL/uL (ref 4.22–5.81)
RDW: 12.3 % (ref 11.5–15.5)
WBC: 6.7 10*3/uL (ref 4.0–10.5)

## 2014-09-10 MED ORDER — CEPHALEXIN 500 MG PO CAPS: 500.0000 mg | ORAL_CAPSULE | Freq: Three times a day (TID) | ORAL | Status: DC

## 2014-09-10 MED ORDER — OXYCODONE-ACETAMINOPHEN 5-325 MG PO TABS
1.0000 | ORAL_TABLET | Freq: Once | ORAL | Status: AC
  Administered 2014-09-10: 1 via ORAL
  Filled 2014-09-10: qty 1

## 2014-09-10 NOTE — ED Notes (Signed)
Pt transported to xray. Will draw labs and administer pain medication with pt return.

## 2014-09-10 NOTE — Discharge Instructions (Signed)
Please make it to your wound care clinic tomorrow (11/12) at 10:15 AM. Also take Kelfex 500 mg three times a day for the next 10 days.

## 2014-09-10 NOTE — ED Notes (Signed)
Pt reports worsening left lower leg pain over past week. Pt reports ulcer to left lower leg for two months. Left lower leg edematous. Pt moderate pedal pulses bilaterally.

## 2014-09-10 NOTE — ED Provider Notes (Signed)
CSN: [id number]     Arrival date & time 09/10/14  [id number] History   First MD Initiated Contact with Patient 09/10/14 0750     Chief Complaint  Patient presents with  . Leg Pain    HPI [name] is a 38yo man w/ PMHx of DVT/PE with IVC filter who presents to the ED for a left leg ulcer. Patient states he has had the ulcer for 2 months, but has become more red, edematous, and tender over the last 2 weeks. He initiially got the wound after bumping his leg at work. He notes he wears compression stockings for his left leg DVT. He reports the wound has been bleeding through and unable to heal well because every time he takes the stocking off it rips open the wound. He states he has not been wearing the stocking for the last 2 weeks in hopes to give the ulcer a chance to heal. He reports his lower left leg is very tender to touch and he has been having difficulty walking on it due to pain. He has not been seen at a wound clinic or taken antibiotics. He denies fever and chills. He reports this pain is different than his DVT pain and states he is still on Xarelto.   Past Medical History  Diagnosis Date  . DVT (deep venous thrombosis)   . PE (pulmonary embolism)   . Arthritis    Past Surgical History  Procedure Laterality Date  . Fracture surgery      plate on R forearm with external fixation  . Vena cava filter placement     Family History  Problem Relation Age of Onset  . Diabetes Mother   . Diabetes Father   . Deep vein thrombosis Father   . Diabetes Sister   . Diabetes Brother   . Deep vein thrombosis Brother    History  Substance Use Topics  . Smoking status: Current Every Day Smoker -- 1.00 packs/day    Types: Cigarettes  . Smokeless tobacco: Not on file  . Alcohol Use: No    Review of Systems General: Denies night sweats, changes in weight, changes in appetite HEENT: Denies headaches, ear pain, changes in vision, rhinorrhea, sore throat CV: Denies CP, palpitations, SOB,  orthopnea Pulm: Denies SOB, cough, wheezing GI: Denies abdominal pain, nausea, vomiting, diarrhea, constipation, melena, hematochezia GU: Denies dysuria, hematuria, frequency Msk: Denies muscle cramps, joint pains Neuro: Denies weakness, numbness, tingling Skin: See HPI   Allergies  Review of patient's allergies indicates no known allergies.  Home Medications   Prior to Admission medications   Medication Sig Start Date End Date Taking? Authorizing Provider  gabapentin (NEURONTIN) 800 MG tablet Take 800-1,600 mg by mouth 3 (three) times daily. Take 1600mg  in the morning, 800mg  in the afternoon, and 1600mg  at bedtime   Yes Historical Provider, MD  influenza vac recombinant HA trivalent (FLUBLOK) injection Inject 0.5 mLs into the muscle once.   Yes Historical Provider, MD  methadone (DOLOPHINE) 10 MG tablet Take 10 mg by mouth 3 (three) times daily.    Yes Historical Provider, MD  rivaroxaban (XARELTO) 20 MG TABS tablet Take 20 mg by mouth daily.   Yes Historical Provider, MD  cephALEXin (KEFLEX) 500 MG capsule Take 1 capsule (500 mg total) by mouth 4 (four) times daily. 09/22/13   [name] Parker, PA-C  enoxaparin (LOVENOX) 150 MG/ML injection Inject 0.87 mLs (130 mg total) into the skin every 12 (twelve) hours. 09/26/13   [name] Charlestonhristopher B Komanski, MD  gabapentin (NEURONTIN) 600 MG tablet Take 600 mg by mouth 3 (three) times daily.    Historical Provider, MD  oxyCODONE-acetaminophen (PERCOCET/ROXICET) 5-325 MG per tablet Take 1-2 tablets by mouth every 4 (four) hours as needed for severe pain. 09/26/13   [name] Charlestonhristopher B Komanski, MD  warfarin (COUMADIN) 10 MG tablet Take 1.5 tablets (15 mg total) by mouth daily. For three days. INR must be checked Monday December 1st for dose titration. 09/26/13   [name] Charlestonhristopher B Komanski, MD   BP 141/96 mmHg  Pulse 79  Temp(Src) 98.3 F (36.8 C) (Oral)  Resp 16  SpO2 99% Physical Exam General: alert, sitting up in bed, appears uncomfortable HEENT: [name],  EOMI, sclera anicteric, mucus membranes moist CV: RRR, normal S1/S2, no m/g/r Pulm: CTA bilaterally, breaths non-labored Abd: BS+, soft, non-tender Ext: Left anterior leg has an ulcer with a dark, black necrotic-looking center and surrounding erythema that extends to the ankle and to the upper 1/3 of the lower leg. Area is very tender to palpation and warm. Left ankle appears significantly more swollen than right. Distal pulses 1+.  Neuro: alert and oriented x 3, CNs II-XII intact, strength in lower extremities limited on exam due to pain   ED Course  Procedures (including critical care time) Labs Review Labs Reviewed  BASIC METABOLIC PANEL - Abnormal; Notable for the following:    Glucose, Bld 131 (*)    GFR calc non Af Amer 84 (*)    All other components within normal limits  CBC WITH DIFFERENTIAL - Abnormal; Notable for the following:    Platelets 135 (*)    All other components within normal limits    Imaging Review Dg Tibia/fibula Left  09/10/2014   CLINICAL DATA:  Left leg ulcer and pain. Nonhealing wound at the anterior, mid aspect of the tibia/fibula with redness and swelling. Original injury 2 months ago.  EXAM: LEFT TIBIA AND FIBULA - 2 VIEW  COMPARISON:  Left knee radiograph 04/06/2014  FINDINGS: There is evidence mild, diffuse soft tissue swelling involving the lower leg and ankle. No soft tissue emphysema or radiopaque foreign body is seen. The tibia and fibula appear intact without evidence of fracture or erosion. The knee and ankle are located.  IMPRESSION: Soft tissue swelling. No acute osseous abnormality or erosion identified.   Electronically Signed   By: [name]   On: 09/10/2014 08:23     EKG Interpretation None      MDM   Final diagnoses:  Leg pain  Leg ulcer, left   Patient presents with 2 month hx of left anterior leg wound that has been worsening over last 2 weeks. Wound appears to be infected, concern for osteomyelitis. Will get tib-fib x-ray of left  leg and check basic labs.   X-ray shows soft tissue swelling, no evidence of osteomyelitis. WBC count normal. No anion gap. Will refer patient to wound care clinic.   Patient [facility] tomorrow (11/12) at 10:15 AM. Will discharge with Keflex 500 mg TID for 10 days.   [name] Numberarly Rivet, MD 09/10/14 [phone]  [name] Shiobert L Beaton, MD 09/16/14 1300

## 2014-09-11 ENCOUNTER — Encounter (HOSPITAL_BASED_OUTPATIENT_CLINIC_OR_DEPARTMENT_OTHER): Payer: Medicare HMO | Attending: Internal Medicine

## 2014-09-11 DIAGNOSIS — L03116 Cellulitis of left lower limb: Secondary | ICD-10-CM | POA: Insufficient documentation

## 2014-09-11 DIAGNOSIS — I87312 Chronic venous hypertension (idiopathic) with ulcer of left lower extremity: Secondary | ICD-10-CM | POA: Diagnosis present

## 2014-09-11 DIAGNOSIS — L97929 Non-pressure chronic ulcer of unspecified part of left lower leg with unspecified severity: Secondary | ICD-10-CM | POA: Diagnosis not present

## 2014-09-11 NOTE — Progress Notes (Signed)
[facility]  NAMMarline [name]:  [name], [name]                ACCOUNT NO.:  [id number]  MEDICAL RECORD NO.:  [id number]      DATE OF BIRTH:  [birth year]  PHYSICIAN:  Maxwell CaulMichael G. Elisha Cooksey, M.D. VISIT DATE:  09/11/2014                                  OFFICE VISIT   FACILITY[facility]  [facility].  HISTORY OF PRESENT ILLNESS:  [name] is a 38 year old man who arrives for our review of a wound on his left anterior leg.  Have a note from September 10, 2014, at which time, he [facility].  The patient tells me that he traumatized his left leg at work 2 months ago. The wound opened up at that time.  He has a history of left leg DVT and apparently has an IVC filter and wears chronic compression stockings, although he has not been wearing the compression stockings for 2 weeks or so and attempt to allow this to heal.  He is here for our review of this wound.  PAST MEDICAL HISTORY:  DVT, pulmonary embolism, and arthritis.  PAST SURGICAL HISTORY:  Plate on the right forearm with external fixation, vena cava filter placement.  FAMILY HISTORY:  Positive for venous thrombosis apparently in a father and a brother.  CURRENT MEDICATIONS:  Neurontin 300 two tablets in the morning, one at noon and two at night; Xarelto 20 daily; methadone 10 t.i.d.  PHYSICAL EXAMINATION:  Temperature is 98.1, pulse 76, respiration 16, blood pressure 152/100.  Vascular assessment; ABI in this clinic calculated at 1.1.  His peripheral pulses are palpable.  Extremities: He has very significant stasis-mediated inflammation in both legs especially on the left leg anteriorly.  The wound is on the left anterior leg measuring 1.4 x 1.2 x 0.1.  This was covered and a tight surface eschar and blackened material.  The area around the wound has a dark discoloration.  His legs are very tender chronically (questioned the reason for Neurontin).  The area was anesthetized with topical lidocaine  and underwent a light surface debridement with a #15 scalpel.  He tolerated this marginally. This does clean up fairly nicely and appeared to be some granulation showing through, although I was not able to complete as much of a debridement as I might have liked.  IMPRESSION/PLAN:  Trauma in the setting of significant lower extremity chronic venous hypertension, inflammation.  The wound was debrided.  As noted that we applied Santyl with a foam covering and put him in an [employer] boot.  I was not completely happy with the look of this wound or the surrounding tissue.  If this does not progress towards healing, I consider a biopsy.  The patient did tell me that he felt his skin did not have lesion before his trauma that was worrisome, although the entire area looks somewhat suspicious to me.          ______________________________ Maxwell CaulMichael G. Bre Pecina, M.D.     MGR/MEDQ  D:  09/11/2014  T:  09/11/2014  Job:  [id number]

## 2014-09-18 DIAGNOSIS — I87312 Chronic venous hypertension (idiopathic) with ulcer of left lower extremity: Secondary | ICD-10-CM | POA: Diagnosis not present

## 2014-09-18 DIAGNOSIS — L03116 Cellulitis of left lower limb: Secondary | ICD-10-CM | POA: Diagnosis not present

## 2014-09-18 DIAGNOSIS — L97929 Non-pressure chronic ulcer of unspecified part of left lower leg with unspecified severity: Secondary | ICD-10-CM | POA: Diagnosis not present

## 2014-09-29 ENCOUNTER — Encounter (HOSPITAL_BASED_OUTPATIENT_CLINIC_OR_DEPARTMENT_OTHER): Payer: Medicare HMO | Attending: Internal Medicine

## 2014-09-29 DIAGNOSIS — L97921 Non-pressure chronic ulcer of unspecified part of left lower leg limited to breakdown of skin: Secondary | ICD-10-CM | POA: Diagnosis not present

## 2014-09-29 DIAGNOSIS — I87332 Chronic venous hypertension (idiopathic) with ulcer and inflammation of left lower extremity: Secondary | ICD-10-CM | POA: Diagnosis not present

## 2014-10-01 ENCOUNTER — Encounter (HOSPITAL_BASED_OUTPATIENT_CLINIC_OR_DEPARTMENT_OTHER): Payer: Medicare HMO

## 2014-10-08 DIAGNOSIS — L97921 Non-pressure chronic ulcer of unspecified part of left lower leg limited to breakdown of skin: Secondary | ICD-10-CM | POA: Diagnosis not present

## 2014-10-08 DIAGNOSIS — I87332 Chronic venous hypertension (idiopathic) with ulcer and inflammation of left lower extremity: Secondary | ICD-10-CM | POA: Diagnosis not present

## 2014-10-15 ENCOUNTER — Other Ambulatory Visit: Payer: Self-pay | Admitting: Internal Medicine

## 2014-10-15 DIAGNOSIS — L97921 Non-pressure chronic ulcer of unspecified part of left lower leg limited to breakdown of skin: Secondary | ICD-10-CM | POA: Diagnosis not present

## 2014-10-15 DIAGNOSIS — I87332 Chronic venous hypertension (idiopathic) with ulcer and inflammation of left lower extremity: Secondary | ICD-10-CM | POA: Diagnosis not present

## 2014-10-22 DIAGNOSIS — I87332 Chronic venous hypertension (idiopathic) with ulcer and inflammation of left lower extremity: Secondary | ICD-10-CM | POA: Diagnosis not present

## 2014-10-22 DIAGNOSIS — L97921 Non-pressure chronic ulcer of unspecified part of left lower leg limited to breakdown of skin: Secondary | ICD-10-CM | POA: Diagnosis not present

## 2014-11-05 ENCOUNTER — Encounter (HOSPITAL_BASED_OUTPATIENT_CLINIC_OR_DEPARTMENT_OTHER): Payer: Medicare HMO | Attending: Internal Medicine

## 2014-11-05 DIAGNOSIS — I878 Other specified disorders of veins: Secondary | ICD-10-CM | POA: Insufficient documentation

## 2014-11-05 DIAGNOSIS — L97921 Non-pressure chronic ulcer of unspecified part of left lower leg limited to breakdown of skin: Secondary | ICD-10-CM | POA: Insufficient documentation

## 2014-11-26 DIAGNOSIS — L97921 Non-pressure chronic ulcer of unspecified part of left lower leg limited to breakdown of skin: Secondary | ICD-10-CM | POA: Diagnosis not present

## 2014-11-26 DIAGNOSIS — I878 Other specified disorders of veins: Secondary | ICD-10-CM | POA: Diagnosis not present

## 2014-12-10 ENCOUNTER — Encounter (HOSPITAL_BASED_OUTPATIENT_CLINIC_OR_DEPARTMENT_OTHER): Payer: Medicare HMO | Attending: Internal Medicine

## 2014-12-10 DIAGNOSIS — I87332 Chronic venous hypertension (idiopathic) with ulcer and inflammation of left lower extremity: Secondary | ICD-10-CM | POA: Insufficient documentation

## 2014-12-10 DIAGNOSIS — L97822 Non-pressure chronic ulcer of other part of left lower leg with fat layer exposed: Secondary | ICD-10-CM | POA: Diagnosis not present

## 2014-12-24 DIAGNOSIS — L97822 Non-pressure chronic ulcer of other part of left lower leg with fat layer exposed: Secondary | ICD-10-CM | POA: Diagnosis not present

## 2014-12-24 DIAGNOSIS — I87332 Chronic venous hypertension (idiopathic) with ulcer and inflammation of left lower extremity: Secondary | ICD-10-CM | POA: Diagnosis not present

## 2015-01-07 ENCOUNTER — Encounter (HOSPITAL_BASED_OUTPATIENT_CLINIC_OR_DEPARTMENT_OTHER): Payer: Medicare HMO | Attending: Internal Medicine

## 2015-01-07 DIAGNOSIS — Z7901 Long term (current) use of anticoagulants: Secondary | ICD-10-CM | POA: Insufficient documentation

## 2015-01-07 DIAGNOSIS — L03116 Cellulitis of left lower limb: Secondary | ICD-10-CM | POA: Insufficient documentation

## 2015-01-07 DIAGNOSIS — Z86711 Personal history of pulmonary embolism: Secondary | ICD-10-CM | POA: Diagnosis not present

## 2015-01-07 DIAGNOSIS — L97921 Non-pressure chronic ulcer of unspecified part of left lower leg limited to breakdown of skin: Secondary | ICD-10-CM | POA: Insufficient documentation

## 2015-01-07 DIAGNOSIS — I83229 Varicose veins of left lower extremity with both ulcer of unspecified site and inflammation: Secondary | ICD-10-CM | POA: Insufficient documentation

## 2015-01-14 DIAGNOSIS — L97921 Non-pressure chronic ulcer of unspecified part of left lower leg limited to breakdown of skin: Secondary | ICD-10-CM | POA: Diagnosis not present

## 2015-01-14 DIAGNOSIS — Z86711 Personal history of pulmonary embolism: Secondary | ICD-10-CM | POA: Diagnosis not present

## 2015-01-14 DIAGNOSIS — L03116 Cellulitis of left lower limb: Secondary | ICD-10-CM | POA: Diagnosis not present

## 2015-01-14 DIAGNOSIS — I83229 Varicose veins of left lower extremity with both ulcer of unspecified site and inflammation: Secondary | ICD-10-CM | POA: Diagnosis not present

## 2015-01-28 DIAGNOSIS — Z86711 Personal history of pulmonary embolism: Secondary | ICD-10-CM | POA: Diagnosis not present

## 2015-01-28 DIAGNOSIS — L03116 Cellulitis of left lower limb: Secondary | ICD-10-CM | POA: Diagnosis not present

## 2015-01-28 DIAGNOSIS — L97921 Non-pressure chronic ulcer of unspecified part of left lower leg limited to breakdown of skin: Secondary | ICD-10-CM | POA: Diagnosis not present

## 2015-01-28 DIAGNOSIS — I83229 Varicose veins of left lower extremity with both ulcer of unspecified site and inflammation: Secondary | ICD-10-CM | POA: Diagnosis not present

## 2015-08-02 ENCOUNTER — Emergency Department (HOSPITAL_COMMUNITY): Admission: EM | Admit: 2015-08-02 | Discharge: 2015-08-02 | Payer: Self-pay

## 2015-08-02 DIAGNOSIS — M545 Low back pain: Secondary | ICD-10-CM | POA: Diagnosis not present

## 2015-08-03 DIAGNOSIS — M1288 Other specific arthropathies, not elsewhere classified, other specified site: Secondary | ICD-10-CM | POA: Diagnosis not present

## 2015-08-03 DIAGNOSIS — M47817 Spondylosis without myelopathy or radiculopathy, lumbosacral region: Secondary | ICD-10-CM | POA: Diagnosis not present

## 2015-08-03 DIAGNOSIS — E669 Obesity, unspecified: Secondary | ICD-10-CM | POA: Diagnosis not present

## 2015-08-03 DIAGNOSIS — Z79899 Other long term (current) drug therapy: Secondary | ICD-10-CM | POA: Diagnosis not present

## 2015-08-03 DIAGNOSIS — M545 Low back pain: Secondary | ICD-10-CM | POA: Diagnosis not present

## 2015-08-03 DIAGNOSIS — G894 Chronic pain syndrome: Secondary | ICD-10-CM | POA: Diagnosis not present

## 2015-08-04 DIAGNOSIS — M545 Low back pain: Secondary | ICD-10-CM | POA: Diagnosis not present

## 2015-09-17 ENCOUNTER — Other Ambulatory Visit: Payer: Self-pay | Admitting: Pain Medicine

## 2015-09-17 DIAGNOSIS — M47817 Spondylosis without myelopathy or radiculopathy, lumbosacral region: Secondary | ICD-10-CM | POA: Diagnosis not present

## 2015-09-17 DIAGNOSIS — E669 Obesity, unspecified: Secondary | ICD-10-CM | POA: Diagnosis not present

## 2015-09-17 DIAGNOSIS — G894 Chronic pain syndrome: Secondary | ICD-10-CM | POA: Diagnosis not present

## 2015-09-17 DIAGNOSIS — Z79899 Other long term (current) drug therapy: Secondary | ICD-10-CM | POA: Diagnosis not present

## 2015-09-17 DIAGNOSIS — M545 Low back pain: Secondary | ICD-10-CM

## 2015-09-17 DIAGNOSIS — M1288 Other specific arthropathies, not elsewhere classified, other specified site: Secondary | ICD-10-CM | POA: Diagnosis not present

## 2015-10-03 ENCOUNTER — Other Ambulatory Visit: Payer: Medicare HMO

## 2015-10-04 ENCOUNTER — Ambulatory Visit
Admission: RE | Admit: 2015-10-04 | Discharge: 2015-10-04 | Disposition: A | Discharge status: Home / Self Care | Payer: Medicare HMO | Source: Ambulatory Visit | Attending: Anesthesiology | Admitting: Anesthesiology

## 2015-10-04 ENCOUNTER — Ambulatory Visit
Admission: RE | Admit: 2015-10-04 | Discharge: 2015-10-04 | Disposition: A | Discharge status: Home / Self Care | Payer: Medicare HMO | Source: Ambulatory Visit | Attending: Pain Medicine | Admitting: Pain Medicine

## 2015-10-04 ENCOUNTER — Other Ambulatory Visit: Payer: Self-pay | Admitting: Anesthesiology

## 2015-10-04 DIAGNOSIS — M545 Low back pain: Secondary | ICD-10-CM | POA: Diagnosis not present

## 2015-10-04 DIAGNOSIS — M5126 Other intervertebral disc displacement, lumbar region: Secondary | ICD-10-CM | POA: Diagnosis not present

## 2015-10-14 DIAGNOSIS — R200 Anesthesia of skin: Secondary | ICD-10-CM | POA: Diagnosis not present

## 2015-10-14 DIAGNOSIS — M545 Low back pain: Secondary | ICD-10-CM | POA: Diagnosis not present

## 2015-10-15 DIAGNOSIS — Z79899 Other long term (current) drug therapy: Secondary | ICD-10-CM | POA: Diagnosis not present

## 2015-10-15 DIAGNOSIS — M545 Low back pain: Secondary | ICD-10-CM | POA: Diagnosis not present

## 2015-10-15 DIAGNOSIS — M1288 Other specific arthropathies, not elsewhere classified, other specified site: Secondary | ICD-10-CM | POA: Diagnosis not present

## 2015-10-15 DIAGNOSIS — E669 Obesity, unspecified: Secondary | ICD-10-CM | POA: Diagnosis not present

## 2015-10-15 DIAGNOSIS — M47817 Spondylosis without myelopathy or radiculopathy, lumbosacral region: Secondary | ICD-10-CM | POA: Diagnosis not present

## 2015-10-15 DIAGNOSIS — G894 Chronic pain syndrome: Secondary | ICD-10-CM | POA: Diagnosis not present

## 2015-10-28 DIAGNOSIS — R911 Solitary pulmonary nodule: Secondary | ICD-10-CM | POA: Diagnosis not present

## 2015-10-28 DIAGNOSIS — I871 Compression of vein: Secondary | ICD-10-CM | POA: Diagnosis not present

## 2015-10-28 DIAGNOSIS — F17210 Nicotine dependence, cigarettes, uncomplicated: Secondary | ICD-10-CM | POA: Diagnosis not present

## 2015-10-28 DIAGNOSIS — J9811 Atelectasis: Secondary | ICD-10-CM | POA: Diagnosis not present

## 2015-10-28 DIAGNOSIS — R69 Illness, unspecified: Secondary | ICD-10-CM | POA: Diagnosis not present

## 2015-10-28 DIAGNOSIS — R079 Chest pain, unspecified: Secondary | ICD-10-CM | POA: Diagnosis not present

## 2015-10-28 DIAGNOSIS — Z86711 Personal history of pulmonary embolism: Secondary | ICD-10-CM | POA: Diagnosis not present

## 2015-10-28 DIAGNOSIS — R0602 Shortness of breath: Secondary | ICD-10-CM | POA: Diagnosis not present

## 2015-10-28 DIAGNOSIS — Z79899 Other long term (current) drug therapy: Secondary | ICD-10-CM | POA: Diagnosis not present

## 2015-10-28 DIAGNOSIS — I82513 Chronic embolism and thrombosis of femoral vein, bilateral: Secondary | ICD-10-CM | POA: Diagnosis not present

## 2015-10-28 DIAGNOSIS — R161 Splenomegaly, not elsewhere classified: Secondary | ICD-10-CM | POA: Diagnosis not present

## 2015-10-28 DIAGNOSIS — D6859 Other primary thrombophilia: Secondary | ICD-10-CM | POA: Diagnosis not present

## 2015-10-28 DIAGNOSIS — Z7901 Long term (current) use of anticoagulants: Secondary | ICD-10-CM | POA: Diagnosis not present

## 2015-10-28 DIAGNOSIS — Z95820 Peripheral vascular angioplasty status with implants and grafts: Secondary | ICD-10-CM | POA: Diagnosis not present

## 2015-10-28 DIAGNOSIS — I82409 Acute embolism and thrombosis of unspecified deep veins of unspecified lower extremity: Secondary | ICD-10-CM | POA: Diagnosis not present

## 2015-11-12 DIAGNOSIS — M47817 Spondylosis without myelopathy or radiculopathy, lumbosacral region: Secondary | ICD-10-CM | POA: Diagnosis not present

## 2015-11-22 DIAGNOSIS — G8929 Other chronic pain: Secondary | ICD-10-CM | POA: Diagnosis not present

## 2015-11-22 DIAGNOSIS — E669 Obesity, unspecified: Secondary | ICD-10-CM | POA: Diagnosis not present

## 2015-11-22 DIAGNOSIS — I8310 Varicose veins of unspecified lower extremity with inflammation: Secondary | ICD-10-CM | POA: Diagnosis not present

## 2015-12-01 DIAGNOSIS — B078 Other viral warts: Secondary | ICD-10-CM | POA: Diagnosis not present

## 2015-12-01 DIAGNOSIS — L219 Seborrheic dermatitis, unspecified: Secondary | ICD-10-CM | POA: Diagnosis not present

## 2015-12-01 DIAGNOSIS — I890 Lymphedema, not elsewhere classified: Secondary | ICD-10-CM | POA: Diagnosis not present

## 2015-12-01 DIAGNOSIS — L821 Other seborrheic keratosis: Secondary | ICD-10-CM | POA: Diagnosis not present

## 2015-12-01 DIAGNOSIS — I872 Venous insufficiency (chronic) (peripheral): Secondary | ICD-10-CM | POA: Diagnosis not present

## 2015-12-10 DIAGNOSIS — M1288 Other specific arthropathies, not elsewhere classified, other specified site: Secondary | ICD-10-CM | POA: Diagnosis not present

## 2015-12-10 DIAGNOSIS — M47817 Spondylosis without myelopathy or radiculopathy, lumbosacral region: Secondary | ICD-10-CM | POA: Diagnosis not present

## 2015-12-10 DIAGNOSIS — G894 Chronic pain syndrome: Secondary | ICD-10-CM | POA: Diagnosis not present

## 2015-12-10 DIAGNOSIS — Z79899 Other long term (current) drug therapy: Secondary | ICD-10-CM | POA: Diagnosis not present

## 2015-12-14 ENCOUNTER — Other Ambulatory Visit: Payer: Self-pay | Admitting: Family Medicine

## 2015-12-24 DIAGNOSIS — M47817 Spondylosis without myelopathy or radiculopathy, lumbosacral region: Secondary | ICD-10-CM | POA: Diagnosis not present

## 2016-01-07 DIAGNOSIS — M1288 Other specific arthropathies, not elsewhere classified, other specified site: Secondary | ICD-10-CM | POA: Diagnosis not present

## 2016-01-07 DIAGNOSIS — G894 Chronic pain syndrome: Secondary | ICD-10-CM | POA: Diagnosis not present

## 2016-01-07 DIAGNOSIS — E669 Obesity, unspecified: Secondary | ICD-10-CM | POA: Diagnosis not present

## 2016-01-07 DIAGNOSIS — M47817 Spondylosis without myelopathy or radiculopathy, lumbosacral region: Secondary | ICD-10-CM | POA: Diagnosis not present

## 2016-01-07 DIAGNOSIS — M545 Low back pain: Secondary | ICD-10-CM | POA: Diagnosis not present

## 2016-01-11 DIAGNOSIS — G8929 Other chronic pain: Secondary | ICD-10-CM | POA: Diagnosis not present

## 2016-01-11 DIAGNOSIS — B351 Tinea unguium: Secondary | ICD-10-CM | POA: Diagnosis not present

## 2016-01-11 DIAGNOSIS — R69 Illness, unspecified: Secondary | ICD-10-CM | POA: Diagnosis not present

## 2016-01-21 DIAGNOSIS — M47817 Spondylosis without myelopathy or radiculopathy, lumbosacral region: Secondary | ICD-10-CM | POA: Diagnosis not present

## 2016-02-04 DIAGNOSIS — E669 Obesity, unspecified: Secondary | ICD-10-CM | POA: Diagnosis not present

## 2016-02-04 DIAGNOSIS — M47817 Spondylosis without myelopathy or radiculopathy, lumbosacral region: Secondary | ICD-10-CM | POA: Diagnosis not present

## 2016-02-04 DIAGNOSIS — M1288 Other specific arthropathies, not elsewhere classified, other specified site: Secondary | ICD-10-CM | POA: Diagnosis not present

## 2016-02-04 DIAGNOSIS — M545 Low back pain: Secondary | ICD-10-CM | POA: Diagnosis not present

## 2016-02-04 DIAGNOSIS — G894 Chronic pain syndrome: Secondary | ICD-10-CM | POA: Diagnosis not present

## 2016-03-03 DIAGNOSIS — M545 Low back pain: Secondary | ICD-10-CM | POA: Diagnosis not present

## 2016-03-03 DIAGNOSIS — G894 Chronic pain syndrome: Secondary | ICD-10-CM | POA: Diagnosis not present

## 2016-03-03 DIAGNOSIS — E669 Obesity, unspecified: Secondary | ICD-10-CM | POA: Diagnosis not present

## 2016-03-03 DIAGNOSIS — M1288 Other specific arthropathies, not elsewhere classified, other specified site: Secondary | ICD-10-CM | POA: Diagnosis not present

## 2016-03-03 DIAGNOSIS — M47817 Spondylosis without myelopathy or radiculopathy, lumbosacral region: Secondary | ICD-10-CM | POA: Diagnosis not present

## 2016-03-31 DIAGNOSIS — Z79899 Other long term (current) drug therapy: Secondary | ICD-10-CM | POA: Diagnosis not present

## 2016-03-31 DIAGNOSIS — M1288 Other specific arthropathies, not elsewhere classified, other specified site: Secondary | ICD-10-CM | POA: Diagnosis not present

## 2016-03-31 DIAGNOSIS — M47817 Spondylosis without myelopathy or radiculopathy, lumbosacral region: Secondary | ICD-10-CM | POA: Diagnosis not present

## 2016-03-31 DIAGNOSIS — G894 Chronic pain syndrome: Secondary | ICD-10-CM | POA: Diagnosis not present

## 2016-04-28 DIAGNOSIS — M1288 Other specific arthropathies, not elsewhere classified, other specified site: Secondary | ICD-10-CM | POA: Diagnosis not present

## 2016-04-28 DIAGNOSIS — G894 Chronic pain syndrome: Secondary | ICD-10-CM | POA: Diagnosis not present

## 2016-04-28 DIAGNOSIS — I80203 Phlebitis and thrombophlebitis of unspecified deep vessels of lower extremities, bilateral: Secondary | ICD-10-CM | POA: Diagnosis not present

## 2016-04-28 DIAGNOSIS — M47817 Spondylosis without myelopathy or radiculopathy, lumbosacral region: Secondary | ICD-10-CM | POA: Diagnosis not present

## 2016-04-28 DIAGNOSIS — M545 Low back pain: Secondary | ICD-10-CM | POA: Diagnosis not present

## 2016-05-01 ENCOUNTER — Other Ambulatory Visit (HOSPITAL_COMMUNITY): Payer: Self-pay | Admitting: Surgery

## 2016-05-10 ENCOUNTER — Ambulatory Visit (HOSPITAL_COMMUNITY)
Admission: RE | Admit: 2016-05-10 | Discharge: 2016-05-10 | Disposition: A | Discharge status: Home / Self Care | Payer: Medicare HMO | Source: Ambulatory Visit | Attending: Surgery | Admitting: Surgery

## 2016-05-10 ENCOUNTER — Other Ambulatory Visit: Payer: Self-pay

## 2016-05-10 DIAGNOSIS — Z87891 Personal history of nicotine dependence: Secondary | ICD-10-CM | POA: Diagnosis not present

## 2016-05-10 DIAGNOSIS — Z01818 Encounter for other preprocedural examination: Secondary | ICD-10-CM | POA: Diagnosis not present

## 2016-05-10 DIAGNOSIS — R918 Other nonspecific abnormal finding of lung field: Secondary | ICD-10-CM | POA: Insufficient documentation

## 2016-05-20 ENCOUNTER — Emergency Department (HOSPITAL_COMMUNITY)
Admission: EM | Admit: 2016-05-20 | Discharge: 2016-05-20 | Disposition: A | Discharge status: Home / Self Care | Payer: Medicare HMO | Attending: Emergency Medicine | Admitting: Emergency Medicine

## 2016-05-20 ENCOUNTER — Encounter (HOSPITAL_COMMUNITY): Payer: Self-pay | Admitting: Emergency Medicine

## 2016-05-20 ENCOUNTER — Emergency Department (HOSPITAL_COMMUNITY): Payer: Medicare HMO

## 2016-05-20 DIAGNOSIS — Z79899 Other long term (current) drug therapy: Secondary | ICD-10-CM | POA: Diagnosis not present

## 2016-05-20 DIAGNOSIS — F17210 Nicotine dependence, cigarettes, uncomplicated: Secondary | ICD-10-CM | POA: Diagnosis not present

## 2016-05-20 DIAGNOSIS — R0789 Other chest pain: Secondary | ICD-10-CM | POA: Diagnosis not present

## 2016-05-20 DIAGNOSIS — M7989 Other specified soft tissue disorders: Secondary | ICD-10-CM | POA: Diagnosis present

## 2016-05-20 DIAGNOSIS — R079 Chest pain, unspecified: Secondary | ICD-10-CM | POA: Diagnosis not present

## 2016-05-20 DIAGNOSIS — Z79891 Long term (current) use of opiate analgesic: Secondary | ICD-10-CM | POA: Diagnosis not present

## 2016-05-20 DIAGNOSIS — M1990 Unspecified osteoarthritis, unspecified site: Secondary | ICD-10-CM | POA: Diagnosis not present

## 2016-05-20 DIAGNOSIS — R69 Illness, unspecified: Secondary | ICD-10-CM | POA: Diagnosis not present

## 2016-05-20 LAB — COMPREHENSIVE METABOLIC PANEL
ALBUMIN: 4.4 g/dL (ref 3.5–5.0)
ALT: 32 U/L (ref 17–63)
AST: 25 U/L (ref 15–41)
Alkaline Phosphatase: 59 U/L (ref 38–126)
Anion gap: 7 (ref 5–15)
BUN: 16 mg/dL (ref 6–20)
CHLORIDE: 106 mmol/L (ref 101–111)
CO2: 27 mmol/L (ref 22–32)
CREATININE: 1.21 mg/dL (ref 0.61–1.24)
Calcium: 8.8 mg/dL — ABNORMAL LOW (ref 8.9–10.3)
GFR calc non Af Amer: >60 mL/min (ref >60)
GLUCOSE: 127 mg/dL — AB (ref 65–99)
Potassium: 4 mmol/L (ref 3.5–5.1)
SODIUM: 140 mmol/L (ref 135–145)
Total Bilirubin: 0.9 mg/dL (ref 0.3–1.2)
Total Protein: 7.6 g/dL (ref 6.5–8.1)

## 2016-05-20 LAB — CBC WITH DIFFERENTIAL/PLATELET
Basophils Absolute: 0 10*3/uL (ref 0.0–0.1)
Basophils Relative: 1 %
EOS ABS: 0.1 10*3/uL (ref 0.0–0.7)
Eosinophils Relative: 2 %
HEMATOCRIT: 43 % (ref 39.0–52.0)
HEMOGLOBIN: 14.7 g/dL (ref 13.0–17.0)
LYMPHS ABS: 1.2 10*3/uL (ref 0.7–4.0)
Lymphocytes Relative: 26 %
MCH: 29.9 pg (ref 26.0–34.0)
MCHC: 34.2 g/dL (ref 30.0–36.0)
MCV: 87.6 fL (ref 78.0–100.0)
MONOS PCT: 9 %
Monocytes Absolute: 0.4 10*3/uL (ref 0.1–1.0)
NEUTROS PCT: 62 %
Neutro Abs: 2.9 10*3/uL (ref 1.7–7.7)
Platelets: 133 10*3/uL — ABNORMAL LOW (ref 150–400)
RBC: 4.91 MIL/uL (ref 4.22–5.81)
RDW: 12.5 % (ref 11.5–15.5)
WBC: 4.7 10*3/uL (ref 4.0–10.5)

## 2016-05-20 LAB — D-DIMER, QUANTITATIVE: D-Dimer, Quant: <0.27 ug/mL-FEU (ref 0.00–0.50)

## 2016-05-20 LAB — TROPONIN I

## 2016-05-20 NOTE — ED Notes (Signed)
PT co chest pain. EKG have been completed

## 2016-05-20 NOTE — ED Provider Notes (Addendum)
CSN: [id number]     Arrival date & time 05/20/16  7829 History   First MD Initiated Contact with Patient 05/20/16 [phone]     Chief Complaint  Patient presents with  . Leg Swelling     (Consider location/radiation/quality/duration/timing/severity/associated sxs/prior Treatment) HPI   [name] is a 40 y.o. male who presents with dull, intermittent, crampy sensation in the mid to left anterior lower chest region. Discomfort ongoing for 2 weeks, both day and night. No known provocation, or measures that improve the discomfort. No fever, chills, cough, change in shortness of breath, or dizziness. He has intermittent ongoing chronic sensation of trouble breathing, which requires him to take a deep breath at times. He states that sometimes his oxygen level drops at night, when he is in the hospital. He has chronic recurrent venous thromboembolism, and is on xarelto for that. He has had an IVC filter placed and states that he has had it "cleaned out" twice.    Past Medical History  Diagnosis Date  . DVT (deep venous thrombosis) (HCC)   . PE (pulmonary embolism)   . Arthritis    Past Surgical History  Procedure Laterality Date  . Fracture surgery      plate on R forearm with external fixation  . Vena cava filter placement     Family History  Problem Relation Age of Onset  . Diabetes Mother   . Diabetes Father   . Deep vein thrombosis Father   . Diabetes Sister   . Diabetes Brother   . Deep vein thrombosis Brother    Social History  Substance Use Topics  . Smoking status: Current Every Day Smoker -- 1.00 packs/day    Types: Cigarettes  . Smokeless tobacco: None  . Alcohol Use: No    Review of Systems  All other systems reviewed and are negative.     Allergies  Review of patient's allergies indicates no known allergies.  Home Medications   Prior to Admission medications   Medication Sig Start Date End Date Taking? Authorizing Provider  buPROPion (WELLBUTRIN XL) 150 MG  24 hr tablet Take 150 mg by mouth daily. 03/26/16  Yes Historical Provider, MD  gabapentin (NEURONTIN) 800 MG tablet Take 800-1,600 mg by mouth 3 (three) times daily. Take  in the morning,  in the afternoon, and  at bedtime   Yes Historical Provider, MD  methadone (DOLOPHINE) 10 MG tablet Take 10 mg by mouth 3 (three) times daily.    Yes Historical Provider, MD  NUCYNTA 100 MG TABS Take 100 mg by mouth 3 (three) times daily. 04/28/16  Yes Historical Provider, MD  rivaroxaban (XARELTO) 20 MG TABS tablet Take 20 mg by mouth at bedtime.    Yes Historical Provider, MD  enoxaparin (LOVENOX) 150 MG/ML injection Inject 0.87 mLs (130 mg total) into the skin every 12 (twelve) hours. Patient not taking: Reported on 05/20/2016 09/26/13   Bobbye Charleston, MD  warfarin (COUMADIN) 10 MG tablet Take 1.5 tablets (15 mg total) by mouth daily. For three days. INR must be checked Monday December 1st for dose titration. Patient not taking: Reported on 05/20/2016 09/26/13   Bobbye Charleston, MD   BP 139/97 mmHg  Pulse 91  Temp(Src) 98.7 F (37.1 C) (Oral)  Resp 16  Ht  (1.88 m)  Wt 305 lb (138.347 kg)  BMI 39.14 kg/m2  SpO2 100% Physical Exam  Constitutional: He is oriented to person, place, and time. He appears well-developed. No distress.  Obese  HENT:  Head: Normocephalic and atraumatic.  Right Ear: External ear normal.  Left Ear: External ear normal.  Eyes: Conjunctivae and EOM are normal. Pupils are equal, round, and reactive to light.  Neck: Normal range of motion and phonation normal. Neck supple.  Cardiovascular: Normal rate, regular rhythm and normal heart sounds.   Pulmonary/Chest: Effort normal and breath sounds normal. He exhibits no bony tenderness.  Abdominal: Soft. There is no tenderness.  Musculoskeletal: Normal range of motion.  Neurological: He is alert and oriented to person, place, and time. No cranial nerve deficit or sensory deficit. He exhibits normal  muscle tone. Coordination normal.  Skin: Skin is warm, dry and intact.  Psychiatric: He has a normal mood and affect. His behavior is normal. Judgment and thought content normal.  Nursing note and vitals reviewed.   ED Course  Procedures (including critical care time)  Medications - No data to display  Patient Vitals for the past 24 hrs:  BP Temp Temp src Pulse Resp SpO2 Height Weight  05/20/16 0851 - - - - - 100 % - -  05/20/16 0844 139/97 mmHg 98.7 F (37.1 C) Oral 91 16 94 % 6\' 2"  (1.88 m) (!) 305 lb (138.347 kg)    10:52 AM Reevaluation with update and discussion. After initial assessment and treatment, an updated evaluation reveals He remains comfortable. Findings discussed with patient, and all questions were answered. [name]    Labs Review Labs Reviewed  COMPREHENSIVE METABOLIC PANEL - Abnormal; Notable for the following:    Glucose, Bld 127 (*)    Calcium 8.8 (*)    All other components within normal limits  CBC WITH DIFFERENTIAL/PLATELET - Abnormal; Notable for the following:    Platelets 133 (*)    All other components within normal limits  TROPONIN I  D-DIMER, QUANTITATIVE (NOT AT [facility])    Imaging Review Dg Chest 2 View  05/20/2016  CLINICAL DATA:  Chest pain for 2 weeks.  History of PE.  Smoker. EXAM: CHEST  2 VIEW COMPARISON:  Chest x-ray dated 05/10/2016. FINDINGS: Heart size is normal. Overall cardiomediastinal silhouette is stable in size and configuration. Mild scarring noted at the left lung base. Mild interstitial prominence again noted, likely indicating some degree of chronic interstitial lung disease. Lungs otherwise clear. No evidence of pneumonia. No pleural effusion or pneumothorax seen. Osseous structures about the chest are unremarkable. Stable kyphosis of the thoracic spine. IMPRESSION: No active cardiopulmonary disease. Suspect chronic interstitial lung disease. Electronically Signed   By: Bary Richard M.D.   On: 05/20/2016 09:59   I have  personally reviewed and evaluated these images and lab results as part of my medical decision-making.   EKG Interpretation   Date/Time:  Saturday May 20 2016 08:56:38 EDT Ventricular Rate:  85 PR Interval:    QRS Duration: 129 QT Interval:  406 QTC Calculation: 483 R Axis:   2 Text Interpretation:  Sinus rhythm Right bundle branch block since last  tracing no significant change Confirmed by Tyrease Vandeberg  MD, Prinston Kynard ([id number]) on  05/20/2016 9:12:23 AM      MDM   Final diagnoses:  Nonspecific chest pain    Nonspecific chest pain with reassuring evaluation. I doubt that he has a worsening thromboembolism disorder, with normal d-dimer, and normal vital signs. Possible GI disturbance, versus musculoskeletal disorder. Symptoms are subacute, and he is stable for discharge.  Nursing Notes Reviewed/ Care Coordinated Applicable Imaging Reviewed Interpretation of Laboratory Data incorporated into ED treatment  The patient appears  reasonably screened and/or stabilized for discharge and I doubt any other medical condition or other [facility] requiring further screening, evaluation, or treatment in the ED at this time prior to discharge.  Plan: Home Medications- recommended trial of Zantac twice a day, and continue her usual medications.; Home Treatments- rest; return here if the recommended treatment, does not improve the symptoms; Recommended follow up- PCP follow-up on Monday as scheduled     Mancel Bale, MD 05/20/16 1056  Mancel Bale, MD 05/31/16 [phone]

## 2016-05-20 NOTE — Discharge Instructions (Signed)
Nonspecific Chest Pain  °Chest pain can be caused by many different conditions. There is always a chance that your pain could be related to something serious, such as a heart attack or a blood clot in your lungs. Chest pain can also be caused by conditions that are not life-threatening. If you have chest pain, it is very important to follow up with your health care provider. °CAUSES  °Chest pain can be caused by: °· Heartburn. °· Pneumonia or bronchitis. °· Anxiety or stress. °· Inflammation around your heart (pericarditis) or lung (pleuritis or pleurisy). °· A blood clot in your lung. °· A collapsed lung (pneumothorax). It can develop suddenly on its own (spontaneous pneumothorax) or from trauma to the chest. °· Shingles infection (varicella-zoster virus). °· Heart attack. °· Damage to the bones, muscles, and cartilage that make up your chest wall. This can include: °¨ Bruised bones due to injury. °¨ Strained muscles or cartilage due to frequent or repeated coughing or overwork. °¨ Fracture to one or more ribs. °¨ Sore cartilage due to inflammation (costochondritis). °RISK FACTORS  °Risk factors for chest pain may include: °· Activities that increase your risk for trauma or injury to your chest. °· Respiratory infections or conditions that cause frequent coughing. °· Medical conditions or overeating that can cause heartburn. °· Heart disease or family history of heart disease. °· Conditions or health behaviors that increase your risk of developing a blood clot. °· Having had chicken pox (varicella zoster). °SIGNS AND SYMPTOMS °Chest pain can feel like: °· Burning or tingling on the surface of your chest or deep in your chest. °· Crushing, pressure, aching, or squeezing pain. °· Dull or sharp pain that is worse when you move, cough, or take a deep breath. °· Pain that is also felt in your back, neck, shoulder, or arm, or pain that spreads to any of these areas. °Your chest pain may come and go, or it may stay  constant. °DIAGNOSIS °Lab tests or other studies may be needed to find the cause of your pain. Your health care provider may have you take a test called an ambulatory ECG (electrocardiogram). An ECG records your heartbeat patterns at the time the test is performed. You may also have other tests, such as: °· Transthoracic echocardiogram (TTE). During echocardiography, sound waves are used to create a picture of all of the heart structures and to look at how blood flows through your heart. °· Transesophageal echocardiogram (TEE). This is a more advanced imaging test that obtains images from inside your body. It allows your health care provider to see your heart in finer detail. °· Cardiac monitoring. This allows your health care provider to monitor your heart rate and rhythm in real time. °· Holter monitor. This is a portable device that records your heartbeat and can help to diagnose abnormal heartbeats. It allows your health care provider to track your heart activity for several days, if needed. °· Stress tests. These can be done through exercise or by taking medicine that makes your heart beat more quickly. °· Blood tests. °· Imaging tests. °TREATMENT  °Your treatment depends on what is causing your chest pain. Treatment may include: °· Medicines. These may include: °¨ Acid blockers for heartburn. °¨ Anti-inflammatory medicine. °¨ Pain medicine for inflammatory conditions. °¨ Antibiotic medicine, if an infection is present. °¨ Medicines to dissolve blood clots. °¨ Medicines to treat coronary artery disease. °· Supportive care for conditions that do not require medicines. This may include: °¨ Resting. °¨ Applying heat   or cold packs to injured areas. °¨ Limiting activities until pain decreases. °HOME CARE INSTRUCTIONS °· If you were prescribed an antibiotic medicine, finish it all even if you start to feel better. °· Avoid any activities that bring on chest pain. °· Do not use any tobacco products, including  cigarettes, chewing tobacco, or electronic cigarettes. If you need help quitting, ask your health care provider. °· Do not drink alcohol. °· Take medicines only as directed by your health care provider. °· Keep all follow-up visits as directed by your health care provider. This is important. This includes any further testing if your chest pain does not go away. °· If heartburn is the cause for your chest pain, you may be told to keep your head raised (elevated) while sleeping. This reduces the chance that acid will go from your stomach into your esophagus. °· Make lifestyle changes as directed by your health care provider. These may include: °¨ Getting regular exercise. Ask your health care provider to suggest some activities that are safe for you. °¨ Eating a heart-healthy diet. A registered dietitian can help you to learn healthy eating options. °¨ Maintaining a healthy weight. °¨ Managing diabetes, if necessary. °¨ Reducing stress. °SEEK MEDICAL CARE IF: °· Your chest pain does not go away after treatment. °· You have a rash with blisters on your chest. °· You have a fever. °SEEK IMMEDIATE MEDICAL CARE IF:  °· Your chest pain is worse. °· You have an increasing cough, or you cough up blood. °· You have severe abdominal pain. °· You have severe weakness. °· You faint. °· You have chills. °· You have sudden, unexplained chest discomfort. °· You have sudden, unexplained discomfort in your arms, back, neck, or jaw. °· You have shortness of breath at any time. °· You suddenly start to sweat, or your skin gets clammy. °· You feel nauseous or you vomit. °· You suddenly feel light-headed or dizzy. °· Your heart begins to beat quickly, or it feels like it is skipping beats. °These symptoms may represent a serious problem that is an emergency. Do not wait to see if the symptoms will go away. Get medical help right away. Call your local emergency services (911 in the [place] Do not drive yourself to the hospital. °  °This  information is not intended to replace advice given to you by your health care provider. Make sure you discuss any questions you have with your health care provider. °  °Document Released: 07/26/2005 Document Revised: 11/06/2014 Document Reviewed: 05/22/2014 °Elsevier Interactive Patient Education ©2016 [employer] ° °

## 2016-05-20 NOTE — ED Notes (Signed)
Per patient, he has a history of blood clots in both legs.  Last clot was 18 months ago.  In the past 5 days, left leg has become swollen with increased pain.  He states he has chest pain for a couple of weeks.  Denies weakness/dizzyness.  Denies n/v.  Denies fever, chilis, falls or trauma.

## 2016-05-22 DIAGNOSIS — G8929 Other chronic pain: Secondary | ICD-10-CM | POA: Diagnosis not present

## 2016-05-22 DIAGNOSIS — R079 Chest pain, unspecified: Secondary | ICD-10-CM | POA: Diagnosis not present

## 2016-05-22 DIAGNOSIS — R69 Illness, unspecified: Secondary | ICD-10-CM | POA: Diagnosis not present

## 2016-05-24 ENCOUNTER — Ambulatory Visit: Payer: Medicare HMO | Admitting: Dietician

## 2016-05-26 DIAGNOSIS — G894 Chronic pain syndrome: Secondary | ICD-10-CM | POA: Diagnosis not present

## 2016-05-26 DIAGNOSIS — M47817 Spondylosis without myelopathy or radiculopathy, lumbosacral region: Secondary | ICD-10-CM | POA: Diagnosis not present

## 2016-05-26 DIAGNOSIS — M1288 Other specific arthropathies, not elsewhere classified, other specified site: Secondary | ICD-10-CM | POA: Diagnosis not present

## 2016-05-30 ENCOUNTER — Ambulatory Visit (INDEPENDENT_AMBULATORY_CARE_PROVIDER_SITE_OTHER): Payer: Medicare HMO | Admitting: Psychiatry

## 2016-06-08 ENCOUNTER — Ambulatory Visit (INDEPENDENT_AMBULATORY_CARE_PROVIDER_SITE_OTHER): Payer: Medicare HMO | Admitting: Psychiatry

## 2016-06-23 DIAGNOSIS — M545 Low back pain: Secondary | ICD-10-CM | POA: Diagnosis not present

## 2016-06-23 DIAGNOSIS — M47817 Spondylosis without myelopathy or radiculopathy, lumbosacral region: Secondary | ICD-10-CM | POA: Diagnosis not present

## 2016-06-23 DIAGNOSIS — M1288 Other specific arthropathies, not elsewhere classified, other specified site: Secondary | ICD-10-CM | POA: Diagnosis not present

## 2016-06-23 DIAGNOSIS — G894 Chronic pain syndrome: Secondary | ICD-10-CM | POA: Diagnosis not present

## 2016-07-12 DIAGNOSIS — J029 Acute pharyngitis, unspecified: Secondary | ICD-10-CM | POA: Diagnosis not present

## 2016-07-21 DIAGNOSIS — I87009 Postthrombotic syndrome without complications of unspecified extremity: Secondary | ICD-10-CM | POA: Diagnosis not present

## 2016-07-21 DIAGNOSIS — M47817 Spondylosis without myelopathy or radiculopathy, lumbosacral region: Secondary | ICD-10-CM | POA: Diagnosis not present

## 2016-07-21 DIAGNOSIS — M545 Low back pain: Secondary | ICD-10-CM | POA: Diagnosis not present

## 2016-07-21 DIAGNOSIS — G894 Chronic pain syndrome: Secondary | ICD-10-CM | POA: Diagnosis not present

## 2016-08-16 DIAGNOSIS — M47817 Spondylosis without myelopathy or radiculopathy, lumbosacral region: Secondary | ICD-10-CM | POA: Diagnosis not present

## 2016-08-16 DIAGNOSIS — G894 Chronic pain syndrome: Secondary | ICD-10-CM | POA: Diagnosis not present

## 2016-08-16 DIAGNOSIS — I87009 Postthrombotic syndrome without complications of unspecified extremity: Secondary | ICD-10-CM | POA: Diagnosis not present

## 2016-08-16 DIAGNOSIS — M1288 Other specific arthropathies, not elsewhere classified, other specified site: Secondary | ICD-10-CM | POA: Diagnosis not present

## 2016-08-22 DIAGNOSIS — J209 Acute bronchitis, unspecified: Secondary | ICD-10-CM | POA: Diagnosis not present

## 2016-09-15 DIAGNOSIS — G894 Chronic pain syndrome: Secondary | ICD-10-CM | POA: Diagnosis not present

## 2016-09-15 DIAGNOSIS — M47817 Spondylosis without myelopathy or radiculopathy, lumbosacral region: Secondary | ICD-10-CM | POA: Diagnosis not present

## 2016-09-15 DIAGNOSIS — M545 Low back pain: Secondary | ICD-10-CM | POA: Diagnosis not present

## 2016-09-15 DIAGNOSIS — I87009 Postthrombotic syndrome without complications of unspecified extremity: Secondary | ICD-10-CM | POA: Diagnosis not present

## 2016-10-13 DIAGNOSIS — G894 Chronic pain syndrome: Secondary | ICD-10-CM | POA: Diagnosis not present

## 2016-10-13 DIAGNOSIS — I87009 Postthrombotic syndrome without complications of unspecified extremity: Secondary | ICD-10-CM | POA: Diagnosis not present

## 2016-10-13 DIAGNOSIS — Z79899 Other long term (current) drug therapy: Secondary | ICD-10-CM | POA: Diagnosis not present

## 2016-10-13 DIAGNOSIS — M545 Low back pain: Secondary | ICD-10-CM | POA: Diagnosis not present

## 2016-10-13 DIAGNOSIS — Z79891 Long term (current) use of opiate analgesic: Secondary | ICD-10-CM | POA: Diagnosis not present

## 2016-10-13 DIAGNOSIS — M47817 Spondylosis without myelopathy or radiculopathy, lumbosacral region: Secondary | ICD-10-CM | POA: Diagnosis not present

## 2016-10-31 DIAGNOSIS — Z79899 Other long term (current) drug therapy: Secondary | ICD-10-CM | POA: Diagnosis not present

## 2016-10-31 DIAGNOSIS — Z79891 Long term (current) use of opiate analgesic: Secondary | ICD-10-CM | POA: Diagnosis not present

## 2016-10-31 DIAGNOSIS — G894 Chronic pain syndrome: Secondary | ICD-10-CM | POA: Diagnosis not present

## 2016-11-10 DIAGNOSIS — M47817 Spondylosis without myelopathy or radiculopathy, lumbosacral region: Secondary | ICD-10-CM | POA: Diagnosis not present

## 2016-11-10 DIAGNOSIS — I87009 Postthrombotic syndrome without complications of unspecified extremity: Secondary | ICD-10-CM | POA: Diagnosis not present

## 2016-11-10 DIAGNOSIS — Z79899 Other long term (current) drug therapy: Secondary | ICD-10-CM | POA: Diagnosis not present

## 2016-11-10 DIAGNOSIS — Z79891 Long term (current) use of opiate analgesic: Secondary | ICD-10-CM | POA: Diagnosis not present

## 2016-11-10 DIAGNOSIS — M545 Low back pain: Secondary | ICD-10-CM | POA: Diagnosis not present

## 2016-11-10 DIAGNOSIS — G894 Chronic pain syndrome: Secondary | ICD-10-CM | POA: Diagnosis not present

## 2016-12-08 DIAGNOSIS — M545 Low back pain: Secondary | ICD-10-CM | POA: Diagnosis not present

## 2016-12-08 DIAGNOSIS — Z79891 Long term (current) use of opiate analgesic: Secondary | ICD-10-CM | POA: Diagnosis not present

## 2016-12-08 DIAGNOSIS — I87009 Postthrombotic syndrome without complications of unspecified extremity: Secondary | ICD-10-CM | POA: Diagnosis not present

## 2016-12-08 DIAGNOSIS — M47817 Spondylosis without myelopathy or radiculopathy, lumbosacral region: Secondary | ICD-10-CM | POA: Diagnosis not present

## 2016-12-08 DIAGNOSIS — Z79899 Other long term (current) drug therapy: Secondary | ICD-10-CM | POA: Diagnosis not present

## 2016-12-08 DIAGNOSIS — G894 Chronic pain syndrome: Secondary | ICD-10-CM | POA: Diagnosis not present

## 2016-12-18 DIAGNOSIS — J111 Influenza due to unidentified influenza virus with other respiratory manifestations: Secondary | ICD-10-CM | POA: Diagnosis not present

## 2016-12-27 ENCOUNTER — Encounter: Payer: Self-pay | Admitting: Neurology

## 2016-12-27 ENCOUNTER — Ambulatory Visit (INDEPENDENT_AMBULATORY_CARE_PROVIDER_SITE_OTHER): Payer: Medicare HMO | Admitting: Neurology

## 2016-12-27 ENCOUNTER — Institutional Professional Consult (permissible substitution): Payer: Medicare HMO | Admitting: Neurology

## 2016-12-27 VITALS — BP 133/85 | HR 78 | Resp 20 | Ht 74.0 in | Wt 294.0 lb

## 2016-12-27 DIAGNOSIS — G4719 Other hypersomnia: Secondary | ICD-10-CM | POA: Diagnosis not present

## 2016-12-27 DIAGNOSIS — I82413 Acute embolism and thrombosis of femoral vein, bilateral: Secondary | ICD-10-CM | POA: Diagnosis not present

## 2016-12-27 DIAGNOSIS — R0683 Snoring: Secondary | ICD-10-CM | POA: Diagnosis not present

## 2016-12-27 DIAGNOSIS — G4731 Primary central sleep apnea: Secondary | ICD-10-CM | POA: Diagnosis not present

## 2016-12-27 DIAGNOSIS — R0689 Other abnormalities of breathing: Secondary | ICD-10-CM | POA: Diagnosis not present

## 2016-12-27 DIAGNOSIS — I2699 Other pulmonary embolism without acute cor pulmonale: Secondary | ICD-10-CM

## 2016-12-27 NOTE — Patient Instructions (Signed)
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
## 2017-01-05 DIAGNOSIS — G894 Chronic pain syndrome: Secondary | ICD-10-CM | POA: Diagnosis not present

## 2017-01-05 DIAGNOSIS — Z79899 Other long term (current) drug therapy: Secondary | ICD-10-CM | POA: Diagnosis not present

## 2017-01-05 DIAGNOSIS — M545 Low back pain: Secondary | ICD-10-CM | POA: Diagnosis not present

## 2017-01-05 DIAGNOSIS — M47817 Spondylosis without myelopathy or radiculopathy, lumbosacral region: Secondary | ICD-10-CM | POA: Diagnosis not present

## 2017-01-05 DIAGNOSIS — I87009 Postthrombotic syndrome without complications of unspecified extremity: Secondary | ICD-10-CM | POA: Diagnosis not present

## 2017-01-05 DIAGNOSIS — Z79891 Long term (current) use of opiate analgesic: Secondary | ICD-10-CM | POA: Diagnosis not present

## 2017-01-18 ENCOUNTER — Ambulatory Visit (INDEPENDENT_AMBULATORY_CARE_PROVIDER_SITE_OTHER): Payer: Medicare HMO | Admitting: Neurology

## 2017-01-18 DIAGNOSIS — G4719 Other hypersomnia: Secondary | ICD-10-CM

## 2017-01-18 DIAGNOSIS — I82413 Acute embolism and thrombosis of femoral vein, bilateral: Secondary | ICD-10-CM

## 2017-01-18 DIAGNOSIS — G4731 Primary central sleep apnea: Secondary | ICD-10-CM

## 2017-01-18 DIAGNOSIS — R0689 Other abnormalities of breathing: Secondary | ICD-10-CM

## 2017-01-18 DIAGNOSIS — I2699 Other pulmonary embolism without acute cor pulmonale: Secondary | ICD-10-CM

## 2017-01-18 DIAGNOSIS — G4733 Obstructive sleep apnea (adult) (pediatric): Secondary | ICD-10-CM | POA: Diagnosis not present

## 2017-01-18 DIAGNOSIS — R0683 Snoring: Secondary | ICD-10-CM

## 2017-01-23 NOTE — Addendum Note (Signed)
Addended by: Melvyn NovasHMEIER, Bertram Haddix on: 01/23/2017 05:35 PM   Modules accepted: Orders

## 2017-01-23 NOTE — Procedures (Signed)
PATIENT'S NAME:  [name], [name] DOB:      10/02/1976      MR#:    [id number]     DATE OF RECORDING: 01/18/2017 REFERRING M.D.:  Luretha Murphy, MD Study Performed:   Baseline Polysomnogram HISTORY:   $41 year old male [patient of Dr. Ermalene Searing, presenting with history of Arthritis, DVT, pulmonary embolism and  presenting with symptoms of severe daytime sleepiness.  Here for evaluation for possible hypoxemia, apnea and narcolepsy   The patient endorsed the Epworth Sleepiness Scale at 19/ 24 points.   The patient's weight 293 pounds with a height of 74 (inches), resulting in a BMI of 37.6 kg/m2. The patient's neck circumference measured 18 inches.  CURRENT MEDICATIONS: Wellbutrin, Neurontin, Dolophine, Xarelto   PROCEDURE:  This is a multichannel digital polysomnogram utilizing the Somnostar 11.2 system.  Electrodes and sensors were applied and monitored per AASM Specifications.   EEG, EOG, Chin and Limb EMG, were sampled at 200 Hz.  ECG, Snore and Nasal Pressure, Thermal Airflow, Respiratory Effort, CPAP Flow and Pressure, Oximetry was sampled at 50 Hz. Digital video and audio were recorded.      BASELINE STUDY :  Lights Out was at 22:49 and Lights On at 05:12.  Total recording time (TRT) was 382 minutes, with a total sleep time (TST) of 354.5 minutes.   The patient's sleep latency was 4.5 minutes.   REM latency was 60 minutes.  The sleep efficiency was 92.8 %.     SLEEP ARCHITECTURE: WASO (Wake after sleep onset) was 22.5 minutes.  There were 10.5 minutes in Stage N1, 130.5 minutes Stage N2, 157 minutes Stage N3 and 56.5 minutes in Stage REM.  The percentage of Stage N1 was 3.%, Stage N2 was 36.8%, Stage N3 was 44.3% and Stage R (REM sleep) was 15.9%.   RESPIRATORY ANALYSIS:  There were a total of 48 respiratory events:  3 obstructive apneas, 24 central apneas and 0 mixed apneas with a total of 27 apneas and an apnea index (AI) of 4.6 /hour. There were 21 hypopneas with a hypopnea index of 3.6 /hour.  The patient also had 0 respiratory event related arousals (RERAs).     The total APNEA/HYPOPNEA INDEX (AHI) was 8.1/hour and the total RESPIRATORY DISTURBANCE INDEX was 8.1 /hour.  11 events occurred in REM sleep and 49 events in NREM. The REM AHI was 10.6 /hour, versus a non-REM AHI of 7.7. The patient spent 185.5 minutes of total sleep time in the supine position and 169 minutes in non-supine. The supine AHI was 14.2 versus a non-supine AHI of 1.4.  OXYGEN SATURATION & C02:  The Wake baseline 02 saturation was 96%, with the lowest being 81%. Time spent below 89% saturation equaled 48 minutes.   PERIODIC LIMB MOVEMENTS:  The patient had a total of 16 Periodic Limb Movements.  The Periodic Limb Movement (PLM) index was 2.7 and the PLM Arousal index was 0/hour. The arousals were noted as: 14 were spontaneous, 0 were associated with PLMs, and 4 were associated with respiratory events. Audio and video analysis did not show any abnormal or unusual movements, behaviors, phonations or vocalizations.  The patient did not take bathroom breaks. Snoring was noted. EKG was in keeping with normal sinus rhythm (NSR).    IMPRESSION: 1.Mild Obstructive Sleep Apnea (OSA) , but associated with hypoxemia, and strong REM and supine dependence . 2. Excessive daytime sleepiness complaint may be related to this mild apnea.   RECOMMENDATIONS: CPAP titration is recommended. This  type  of sleep apnea that is REM dependent and associated with low oxygen saturation will not respond to a dental device.   Avoid supine sleep.  This patient's excessive daytime sleepiness needs to be re- evaluated after a minimum of 30 days of CPAP therapy. A follow up appointment [facility]. The referring provider will be notified of the results.     I certify that I have reviewed the entire raw data recording prior to the issuance of this report in accordance with the Standards of  Accreditation of the American Academy of Sleep Medicine (AASM)      Melvyn Novasarmen Leaira Fullam, MD   01-23-2017  Diplomat, American Board of Psychiatry and Neurology  Diplomat, American Board of Sleep Medicine Medical Director, [facility]

## 2017-01-25 ENCOUNTER — Telehealth: Payer: Self-pay

## 2017-01-25 NOTE — Telephone Encounter (Signed)
I called pt. I advised him that Dr. Vickey Hugerohmeier reviewed his sleep study and found that pt does have mild sleep apnea with hypoxemia with a strong REM and supine dependence. Dr. Vickey Hugerohmeier recommends a cpap titration study to treat the REM dependent apnea and hypoxemia. I advised pt to avoid supine sleep. Pt is agreeable to returning for a cpap titration study and knows that our sleep lab will call to get that study scheduled. Pt verbalized understanding of results. Pt had no questions at this time but was encouraged to call back if questions arise.

## 2017-01-25 NOTE — Telephone Encounter (Signed)
-----   Message from Melvyn Novasarmen Dohmeier, MD sent at 01/23/2017  5:35 PM EDT ----- 1.Mild Obstructive Sleep Apnea (OSA) , but associated with hypoxemia, and strong REM and supine dependence . 2. Excessive daytime sleepiness complaint may be related to this mild apnea.   RECOMMENDATIONS: CPAP titration is recommended. This  type of sleep apnea that is REM dependent and associated with low oxygen saturation will not respond to a dental device.   Avoid supine sleep.  This patient's excessive daytime sleepiness needs to be re- evaluated after a minimum of 30 days of CPAP therapy.

## 2017-01-30 DIAGNOSIS — K912 Postsurgical malabsorption, not elsewhere classified: Secondary | ICD-10-CM | POA: Diagnosis not present

## 2017-01-30 DIAGNOSIS — E119 Type 2 diabetes mellitus without complications: Secondary | ICD-10-CM | POA: Diagnosis not present

## 2017-02-02 ENCOUNTER — Other Ambulatory Visit: Payer: Self-pay | Admitting: Physician Assistant

## 2017-02-02 ENCOUNTER — Ambulatory Visit
Admission: RE | Admit: 2017-02-02 | Discharge: 2017-02-02 | Disposition: A | Discharge status: Home / Self Care | Payer: Medicare HMO | Source: Ambulatory Visit | Attending: Physician Assistant | Admitting: Physician Assistant

## 2017-02-02 DIAGNOSIS — G894 Chronic pain syndrome: Secondary | ICD-10-CM | POA: Diagnosis not present

## 2017-02-02 DIAGNOSIS — M25521 Pain in right elbow: Secondary | ICD-10-CM | POA: Diagnosis not present

## 2017-02-02 DIAGNOSIS — M545 Low back pain: Secondary | ICD-10-CM | POA: Diagnosis not present

## 2017-02-02 DIAGNOSIS — I87009 Postthrombotic syndrome without complications of unspecified extremity: Secondary | ICD-10-CM | POA: Diagnosis not present

## 2017-02-02 DIAGNOSIS — M47817 Spondylosis without myelopathy or radiculopathy, lumbosacral region: Secondary | ICD-10-CM | POA: Diagnosis not present

## 2017-02-02 DIAGNOSIS — Z79899 Other long term (current) drug therapy: Secondary | ICD-10-CM | POA: Diagnosis not present

## 2017-02-02 DIAGNOSIS — Z79891 Long term (current) use of opiate analgesic: Secondary | ICD-10-CM | POA: Diagnosis not present

## 2017-02-13 ENCOUNTER — Ambulatory Visit (INDEPENDENT_AMBULATORY_CARE_PROVIDER_SITE_OTHER): Payer: Medicare HMO | Admitting: Neurology

## 2017-02-13 DIAGNOSIS — G4719 Other hypersomnia: Secondary | ICD-10-CM

## 2017-02-13 DIAGNOSIS — G4739 Other sleep apnea: Secondary | ICD-10-CM

## 2017-02-13 DIAGNOSIS — I2699 Other pulmonary embolism without acute cor pulmonale: Secondary | ICD-10-CM

## 2017-02-13 DIAGNOSIS — G4731 Primary central sleep apnea: Secondary | ICD-10-CM | POA: Diagnosis not present

## 2017-02-26 DIAGNOSIS — G4731 Primary central sleep apnea: Principal | ICD-10-CM

## 2017-02-26 DIAGNOSIS — G4739 Other sleep apnea: Secondary | ICD-10-CM | POA: Insufficient documentation

## 2017-02-26 DIAGNOSIS — G4719 Other hypersomnia: Secondary | ICD-10-CM | POA: Insufficient documentation

## 2017-02-26 DIAGNOSIS — I2699 Other pulmonary embolism without acute cor pulmonale: Secondary | ICD-10-CM | POA: Insufficient documentation

## 2017-02-26 NOTE — Procedures (Signed)
PATIENT'S NAME:  [name], [name] DOB:      01-20-1976      MR#:    [id number]     DATE OF RECORDING: 02/13/2017 REFERRING M.D.:  Luretha Murphy, MD Study Performed:   CPAP  Titration HISTORY:  [name] had a baseline sleep study on 01/18/17, which showed the APNEA/HYPOPNEA INDEX (AHI) at 8.1/hour and REM AHI was 10.6 /hour, supine AHI was 14.2 .The Wake baseline 02 saturation was 96%, with the lowest being 81%. Time spent below 89% saturation equaled 48 minutes.  41 year old male patient of Dr. Ermalene Searing, presenting with Arthritis, DVT, pulmonary embolism and with symptoms of severe daytime sleepiness. Here for evaluation for possible hypoxemia, apnea and narcolepsy. The patient endorsed the Epworth Sleepiness Scale at 19/ 24 points.  The patient's weight 293 pounds with a height of 74 (inches), resulting in a BMI of 37.6 kg/m2.The patient's neck circumference measured 18 inches.  CURRENT MEDICATIONS: Wellbutrin, Neurontin, Dolophine, Xarelto   PROCEDURE:  This is a multichannel digital polysomnogram utilizing the SomnoStar 11.2 system.  Electrodes and sensors were applied and monitored per AASM Specifications.   EEG, EOG, Chin and Limb EMG, were sampled at 200 Hz.  ECG, Snore and Nasal Pressure, Thermal Airflow, Respiratory Effort, CPAP Flow and Pressure, Oximetry was sampled at 50 Hz. Digital video and audio were recorded.      CPAP was initiated at 5 cmH20 with heated humidity per AASM split night standards and pressure was advanced to 11 cm CPAP, still having a very high AHI of 65/ hr. higher than before treatment. Change to BiPAP at 11/7 cm and finally 12/8 cm water did not improve the AHI . At a PAP pressure of 8 cmH20, there was a reduction of the AHI to 9.8, the lowest AHI result in this treatment attempt.   Lights Out was at 21:38 and Lights On at 05:19. Total recording time (TRT) was 461.5 minutes, with a total sleep time (TST) of 434 minutes. The patient's sleep latency was 8 minutes with 0  minutes of wake time after sleep onset. REM latency was 58.5 minutes.  The sleep efficiency was 94. %.    SLEEP ARCHITECTURE: WASO (Wake after sleep onset) was 19.5 minutes.  There were 9.5 minutes in Stage N1, 225.5 minutes Stage N2, 135 minutes Stage N3 and 64 minutes in Stage REM.  The percentage of Stage N1 was 2.2%, Stage N2 was 52.%, Stage N3 was 31.1% and Stage R (REM sleep) was 14.7%.   RESPIRATORY ANALYSIS:  There was a total of 208 respiratory events: 63 obstructive apneas, 123 central apneas and 0 mixed apneas with a total of 186 apneas and an apnea index (AI) of 25.7 /hour. There were 22 hypopneas with a hypopnea index of 3.0/hr. The patient also had 0 respiratory event related arousals (RERAs).     The total APNEA/HYPOPNEA INDEX  (AHI) was 28.8 /hour and the total RESPIRATORY DISTURBANCE INDEX was 28.8 .hour  9 events occurred in REM sleep and 199 events in NREM. The REM AHI was 8.4 /hour versus a non-REM AHI of 32.3 /hour.  The patient spent 0 minutes of total sleep time in the supine position and 434 minutes in non-supine. The supine AHI was 0.0, versus a non-supine AHI of 28.7.  OXYGEN SATURATION & C02:  The baseline 02 saturation was 96%, with the lowest being 85%. Time spent below 89% saturation equaled 15 minutes.  The patient had a total of 0 Periodic Limb Movements. The arousals were  noted as: 38 were spontaneous, 0 were associated with PLMs and  27 were associated with respiratory events. Audio and video analysis did not show any abnormal or unusual movements, behaviors, phonations or vocalizations.   The patient did not take bathroom breaks. Snoring was noted. EKG was in keeping with normal sinus rhythm (NSR).  The patient was fitted with a ResMed Airfit P10 with medium pillows.  DIAGNOSIS 1. Complex Sleep Apnea , not responding to CPAP, BiPAP or BiPAP ST.  2. Treatment emergent central sleep apnea. 3. Hypoxemia improved under all PAP modalities of apnea treatment.     PLANS/RECOMMENDATIONS: 1. Patient should return for a repeat sleep study with BiPAP / ASV  titration. 2. Driving precautions:  The patient should be instructed by the primary care physician that the [place] prohibits persons from operating a motor vehicle if sleepy, and should also be instructed to avoid driving and to avoid hazardous machinery if sleepy. The patient should be further instructed by the primary care physician that, if the decision is made to continue driving, and that if the patient becomes sleepy while driving, that the patient should pull off the road for a nap, and not drive again until no longer sleepy. 3. Maintain lean body weight. 4. RV after 30 days of BiPAP or ASV use.    DISCUSSION: A follow up appointment will be scheduled in the Sleep Clinic at [facility].   Please call [phone] with any questions.      I certify that I have reviewed the entire raw data recording prior to the issuance of this report in accordance with the Standards of Accreditation of the American Academy of Sleep Medicine (AASM)      Melvyn Novas, M.D.                             26 February 2017  Diplomat, American Board of Psychiatry and Neurology  Diplomat, Biomedical engineer of Sleep Medicine

## 2017-02-26 NOTE — Addendum Note (Signed)
Addended by: Melvyn Novas on: 02/26/2017 09:38 AM   Modules accepted: Orders

## 2017-02-27 ENCOUNTER — Telehealth: Payer: Self-pay

## 2017-02-27 NOTE — Telephone Encounter (Signed)
I called pt. I advised pt that Dr. Vickey Huger reviewed their sleep study results and found that treatment emergent central apneas were noted during the cpap titration. Dr. Vickey Huger recommends that pt return for a repeat sleep study in order to properly titrate a bipap or ASV and ensure a good mask fit. Pt is agreeable to returning for a titration study. I advised pt that our sleep lab will file with pt's insurance and call pt to schedule the sleep study when we hear back from the pt's insurance regarding coverage of this sleep study. Pt is agreeable to arriving one hour earlier if needed, for extra treatment time. Pt verbalized understanding of results. Pt had no questions at this time but was encouraged to call back if questions arise.

## 2017-02-27 NOTE — Telephone Encounter (Signed)
-----   Message from Melvyn Novas, MD sent at 02/26/2017  9:38 AM EDT ----- Treatment emergent central apneas were noted. I will invite [name] to return for BiPAP ST STUDY -AND IF THIS FAILS AN ASV STUDY to be performed the same night.  Early arrival time will give one extra hour of sleep testing. CD

## 2017-03-08 ENCOUNTER — Ambulatory Visit (INDEPENDENT_AMBULATORY_CARE_PROVIDER_SITE_OTHER): Payer: Medicare HMO | Admitting: Neurology

## 2017-03-08 DIAGNOSIS — G4731 Primary central sleep apnea: Secondary | ICD-10-CM

## 2017-03-08 DIAGNOSIS — I2699 Other pulmonary embolism without acute cor pulmonale: Secondary | ICD-10-CM

## 2017-03-08 DIAGNOSIS — G4739 Other sleep apnea: Secondary | ICD-10-CM

## 2017-03-08 DIAGNOSIS — G4719 Other hypersomnia: Secondary | ICD-10-CM

## 2017-03-09 DIAGNOSIS — Z79899 Other long term (current) drug therapy: Secondary | ICD-10-CM | POA: Diagnosis not present

## 2017-03-09 DIAGNOSIS — M25559 Pain in unspecified hip: Secondary | ICD-10-CM | POA: Diagnosis not present

## 2017-03-09 DIAGNOSIS — M47817 Spondylosis without myelopathy or radiculopathy, lumbosacral region: Secondary | ICD-10-CM | POA: Diagnosis not present

## 2017-03-09 DIAGNOSIS — G894 Chronic pain syndrome: Secondary | ICD-10-CM | POA: Diagnosis not present

## 2017-03-09 DIAGNOSIS — I87009 Postthrombotic syndrome without complications of unspecified extremity: Secondary | ICD-10-CM | POA: Diagnosis not present

## 2017-03-09 DIAGNOSIS — Z79891 Long term (current) use of opiate analgesic: Secondary | ICD-10-CM | POA: Diagnosis not present

## 2017-03-16 NOTE — Addendum Note (Signed)
Addended by: Melvyn NovasHMEIER, Jeslie Lowe on: 03/16/2017 02:34 PM   Modules accepted: Orders

## 2017-03-16 NOTE — Procedures (Signed)
PATIENT'S NAME:  [name], [name] DOB:      [birth year]      MR#:    [id number]     DATE OF RECORDING: 03/08/2017 REFERRING M.D.:  [name] Murphy, MD Study Performed:   CPAP  Titration HISTORY:  [name] returned following a failed CPAP/ BiPAP non ST titration attempt ( treatment emergent central apneas ) for treatment with other PAP modalities  .  The patient endorsed the Epworth Sleepiness Scale at 19 points.   The patient's weight 293 pounds with a height of 74 (inches), resulting in a BMI of 37.6 kg/m2. The patient's neck circumference measured 18 inches.  CURRENT MEDICATIONS: Wellbutrin, Neurontin, Dolophine, Xarelto   PROCEDURE:  This is a multichannel digital polysomnogram utilizing the SomnoStar 11.2 system.  Electrodes and sensors were applied and monitored per AASM Specifications.   EEG, EOG, Chin and Limb EMG, were sampled at 200 Hz.  ECG, Snore and Nasal Pressure, Thermal Airflow, Respiratory Effort, CPAP Flow and Pressure, Oximetry was sampled at 50 Hz. Digital video and audio were recorded.       BiPAP was initiated at 9/5 with ST rate as needed - this pressure helped reduce the AHI but could not eliminate central apneas entirely. BiPAP at 10/6 cm water was still associated with an AHI of 40/hr. The technologist changed to ASV and immediately achieved a reduction in AHI.   ASV was used and somewhat better tolerated, but nasal patency issues continued.   At a PAP pressure of 15 maximal inspiratory pressure/ 5 cm water expiratory pressure  with 3 cmH20 pressure support , there was a reduction of the AHI to 0.0 with improvement of the above symptoms of obstructive sleep apnea. The patient slept 124 minutes under this treatment pressure.    Lights Out was at 21:15 and Lights On at 03:17. Total recording time (TRT) was 362.5 minutes, with a total sleep time (TST) of 304.5 minutes. The patient's sleep latency was 15 minutes with 19 minutes of wake time after sleep onset. REM latency was 18  minutes.  The sleep efficiency was 84. %.    SLEEP ARCHITECTURE: WASO (Wake after sleep onset)  was 32 minutes.  There were 16 minutes in Stage N1, 86 minutes Stage N2, 149.5 minutes Stage N3 and 53 minutes in Stage REM.  The percentage of Stage N1 was 5.3%, Stage N2 was 28.2%, Stage N3 was 49.1% and Stage R (REM sleep) was 17.4%.   RESPIRATORY ANALYSIS:  There was a total of 47 respiratory events: 0 obstructive apneas, 3 central apneas and 0 mixed apneas with a total of 3 apneas and an apnea index (AI) of .6 /hour. There were 44 hypopneas with a hypopnea index of 8.7/hour. The patient also had 0 respiratory event related arousals (RERAs).      The total APNEA/HYPOPNEA INDEX  (AHI) was 9.3 /hour and the total RESPIRATORY DISTURBANCE INDEX was 9.3 .hour  6 events occurred in REM sleep and 41 events in NREM. The REM AHI was 6.8 /hour versus a non-REM AHI of 9.8 /hour.  The patient spent 93.5 minutes of total sleep time in the supine position and 211 minutes in non-supine. The supine AHI was 14.8, versus a non-supine AHI of 6.9.  OXYGEN SATURATION & C02:  The baseline 02 saturation was 90%, with the lowest being 86%. Time spent below 89% saturation equaled 6 minutes.  PERIODIC LIMB MOVEMENTS:    The patient had a total of 0 Periodic Limb Movements. The arousals were noted as:  31 were spontaneous, 0 were associated with PLMs, and 8 were associated with respiratory events. Audio and video analysis did not show any abnormal or unusual movements, behaviors, phonations or vocalizations.  The patient took one bathroom break. Snoring was noted. EKG was in keeping with normal sinus rhythm (NSR). The patient indicated at 3;17 AM that the severe nasal congestion made continuing the study difficult for him  sleep was the same as usual.  The patient was fitted with a ResMed AirFit N20 nasal mask, medium size apparatus.  DIAGNOSIS COMPLEX sleep apnea , CPAP and BiPAP treatment let to emergent central apneas.  ASV  at 15 cm water / 5 cm water with 3 cm pressure support was successful in alleviating the apnea, but nasal congestion prevented the continuation of the study.   PLANS/RECOMMENDATIONS: 1. Weight reduction should be aggressively pursued. a. Improvement of nasal patency if indicated. b. Avoidance of driving if or when sleepy. 2. [] AVAP Recommendations:  a. 1.  15 cm over 5 cm and with 3 cm pressure support (cm H20 pressure) using a Airfit N 20 nasal mask in medium size with heated humidity in the BiPAP circuit.  [facility]   Please call [phone] with any questions.      I certify that I have reviewed the entire raw data recording prior to the issuance of this report in accordance with the Standards of Accreditation of the American Academy of Sleep Medicine (AASM)   [name], M.D.  03-16-2017  Diplomat, American Board of Psychiatry and Neurology  Diplomat, American Board of Sleep Medicine Medical Director, [facility]

## 2017-03-21 ENCOUNTER — Telehealth: Payer: Self-pay

## 2017-03-21 NOTE — Telephone Encounter (Signed)
-----   Message from Melvyn Novasarmen Dohmeier, MD sent at 03/16/2017  2:34 PM EDT ----- ASV was successfull !  15/ 5 with 3 cm pressure support , nasal N 20 in medium. See report. CD

## 2017-03-21 NOTE — Telephone Encounter (Signed)
I called pt. I advised pt that Dr. Vickey Hugerohmeier reviewed their sleep study results and found that pt had successful ASV titration. Dr. Vickey Hugerohmeier recommends that pt start an ASV at home. I reviewed PAP compliance expectations with the pt. Pt is agreeable to starting an ASV. I advised pt that an order will be sent to a DME, [facility], and [facility] will call the pt within about one week after they file with the pt's insurance. [facility] will show the pt how to use the machine, fit for masks, and troubleshoot the ASV if needed. A follow up appt was made for insurance purposes with Dr. Vickey Hugerohmeier on Tuesday, 07/10/2017 at 2:30pm. Pt verbalized understanding to arrive 15 minutes early and bring their ASV. Pt was advised to not drive or operate hazardous machinery when sleepy.  A letter with all of this information in it will be mailed to the pt as a reminder. I verified with the pt that the address we have on file is correct. Pt verbalized understanding of results. Pt had no questions at this time but was encouraged to call back if questions arise.

## 2017-03-30 ENCOUNTER — Other Ambulatory Visit: Payer: Self-pay | Admitting: Physician Assistant

## 2017-03-30 ENCOUNTER — Ambulatory Visit
Admission: RE | Admit: 2017-03-30 | Discharge: 2017-03-30 | Disposition: A | Discharge status: Home / Self Care | Payer: Medicare HMO | Source: Ambulatory Visit | Attending: Physician Assistant | Admitting: Physician Assistant

## 2017-03-30 DIAGNOSIS — M25551 Pain in right hip: Secondary | ICD-10-CM

## 2017-04-03 DIAGNOSIS — B351 Tinea unguium: Secondary | ICD-10-CM | POA: Diagnosis not present

## 2017-04-03 DIAGNOSIS — R69 Illness, unspecified: Secondary | ICD-10-CM | POA: Diagnosis not present

## 2017-04-03 DIAGNOSIS — I8310 Varicose veins of unspecified lower extremity with inflammation: Secondary | ICD-10-CM | POA: Diagnosis not present

## 2017-04-03 DIAGNOSIS — Z720 Tobacco use: Secondary | ICD-10-CM | POA: Diagnosis not present

## 2017-04-03 DIAGNOSIS — Z86718 Personal history of other venous thrombosis and embolism: Secondary | ICD-10-CM | POA: Diagnosis not present

## 2017-04-06 DIAGNOSIS — M47817 Spondylosis without myelopathy or radiculopathy, lumbosacral region: Secondary | ICD-10-CM | POA: Diagnosis not present

## 2017-04-06 DIAGNOSIS — G894 Chronic pain syndrome: Secondary | ICD-10-CM | POA: Diagnosis not present

## 2017-04-06 DIAGNOSIS — Z79899 Other long term (current) drug therapy: Secondary | ICD-10-CM | POA: Diagnosis not present

## 2017-04-06 DIAGNOSIS — Z79891 Long term (current) use of opiate analgesic: Secondary | ICD-10-CM | POA: Diagnosis not present

## 2017-04-06 DIAGNOSIS — M25559 Pain in unspecified hip: Secondary | ICD-10-CM | POA: Diagnosis not present

## 2017-04-06 DIAGNOSIS — I87009 Postthrombotic syndrome without complications of unspecified extremity: Secondary | ICD-10-CM | POA: Diagnosis not present

## 2017-04-09 ENCOUNTER — Ambulatory Visit: Payer: Self-pay | Admitting: Registered"

## 2017-04-10 ENCOUNTER — Other Ambulatory Visit: Payer: Self-pay | Admitting: Pain Medicine

## 2017-04-10 DIAGNOSIS — M545 Low back pain: Secondary | ICD-10-CM

## 2017-04-10 DIAGNOSIS — K760 Fatty (change of) liver, not elsewhere classified: Secondary | ICD-10-CM | POA: Diagnosis not present

## 2017-04-10 DIAGNOSIS — R600 Localized edema: Secondary | ICD-10-CM | POA: Diagnosis not present

## 2017-04-10 DIAGNOSIS — R110 Nausea: Secondary | ICD-10-CM | POA: Diagnosis not present

## 2017-04-10 DIAGNOSIS — I749 Embolism and thrombosis of unspecified artery: Secondary | ICD-10-CM | POA: Diagnosis not present

## 2017-04-10 DIAGNOSIS — Z7901 Long term (current) use of anticoagulants: Secondary | ICD-10-CM | POA: Diagnosis not present

## 2017-04-10 DIAGNOSIS — Z95828 Presence of other vascular implants and grafts: Secondary | ICD-10-CM | POA: Diagnosis not present

## 2017-04-10 DIAGNOSIS — Z86718 Personal history of other venous thrombosis and embolism: Secondary | ICD-10-CM | POA: Diagnosis not present

## 2017-04-10 DIAGNOSIS — R102 Pelvic and perineal pain: Secondary | ICD-10-CM | POA: Diagnosis not present

## 2017-04-10 DIAGNOSIS — I871 Compression of vein: Secondary | ICD-10-CM | POA: Diagnosis not present

## 2017-04-10 DIAGNOSIS — Z5321 Procedure and treatment not carried out due to patient leaving prior to being seen by health care provider: Secondary | ICD-10-CM | POA: Diagnosis not present

## 2017-04-10 DIAGNOSIS — M7989 Other specified soft tissue disorders: Secondary | ICD-10-CM | POA: Diagnosis not present

## 2017-04-11 DIAGNOSIS — M79605 Pain in left leg: Secondary | ICD-10-CM | POA: Diagnosis not present

## 2017-04-11 DIAGNOSIS — Z8249 Family history of ischemic heart disease and other diseases of the circulatory system: Secondary | ICD-10-CM | POA: Diagnosis not present

## 2017-04-11 DIAGNOSIS — M79604 Pain in right leg: Secondary | ICD-10-CM | POA: Diagnosis not present

## 2017-04-11 DIAGNOSIS — K760 Fatty (change of) liver, not elsewhere classified: Secondary | ICD-10-CM | POA: Diagnosis not present

## 2017-04-11 DIAGNOSIS — R110 Nausea: Secondary | ICD-10-CM | POA: Diagnosis not present

## 2017-04-11 DIAGNOSIS — I82521 Chronic embolism and thrombosis of right iliac vein: Secondary | ICD-10-CM | POA: Diagnosis not present

## 2017-04-11 DIAGNOSIS — R161 Splenomegaly, not elsewhere classified: Secondary | ICD-10-CM | POA: Diagnosis not present

## 2017-04-11 DIAGNOSIS — I871 Compression of vein: Secondary | ICD-10-CM | POA: Diagnosis not present

## 2017-04-11 DIAGNOSIS — T82867A Thrombosis of cardiac prosthetic devices, implants and grafts, initial encounter: Secondary | ICD-10-CM | POA: Diagnosis not present

## 2017-04-11 DIAGNOSIS — M7989 Other specified soft tissue disorders: Secondary | ICD-10-CM | POA: Diagnosis not present

## 2017-04-11 DIAGNOSIS — Z7901 Long term (current) use of anticoagulants: Secondary | ICD-10-CM | POA: Diagnosis not present

## 2017-04-11 DIAGNOSIS — Z86711 Personal history of pulmonary embolism: Secondary | ICD-10-CM | POA: Diagnosis not present

## 2017-04-11 DIAGNOSIS — Z720 Tobacco use: Secondary | ICD-10-CM | POA: Diagnosis not present

## 2017-04-11 DIAGNOSIS — I749 Embolism and thrombosis of unspecified artery: Secondary | ICD-10-CM | POA: Diagnosis not present

## 2017-04-11 DIAGNOSIS — R102 Pelvic and perineal pain: Secondary | ICD-10-CM | POA: Diagnosis not present

## 2017-04-11 DIAGNOSIS — I878 Other specified disorders of veins: Secondary | ICD-10-CM | POA: Diagnosis not present

## 2017-04-11 DIAGNOSIS — Z95828 Presence of other vascular implants and grafts: Secondary | ICD-10-CM | POA: Diagnosis not present

## 2017-04-11 DIAGNOSIS — T82868A Thrombosis of vascular prosthetic devices, implants and grafts, initial encounter: Secondary | ICD-10-CM | POA: Diagnosis not present

## 2017-04-11 DIAGNOSIS — R69 Illness, unspecified: Secondary | ICD-10-CM | POA: Diagnosis not present

## 2017-04-11 DIAGNOSIS — D6859 Other primary thrombophilia: Secondary | ICD-10-CM | POA: Diagnosis not present

## 2017-04-11 DIAGNOSIS — G8929 Other chronic pain: Secondary | ICD-10-CM | POA: Diagnosis not present

## 2017-04-11 DIAGNOSIS — R600 Localized edema: Secondary | ICD-10-CM | POA: Diagnosis not present

## 2017-04-11 DIAGNOSIS — Y831 Surgical operation with implant of artificial internal device as the cause of abnormal reaction of the patient, or of later complication, without mention of misadventure at the time of the procedure: Secondary | ICD-10-CM | POA: Diagnosis not present

## 2017-04-12 DIAGNOSIS — I82521 Chronic embolism and thrombosis of right iliac vein: Secondary | ICD-10-CM | POA: Diagnosis not present

## 2017-04-12 DIAGNOSIS — R600 Localized edema: Secondary | ICD-10-CM | POA: Diagnosis not present

## 2017-04-12 DIAGNOSIS — Y831 Surgical operation with implant of artificial internal device as the cause of abnormal reaction of the patient, or of later complication, without mention of misadventure at the time of the procedure: Secondary | ICD-10-CM | POA: Diagnosis not present

## 2017-04-12 DIAGNOSIS — G8929 Other chronic pain: Secondary | ICD-10-CM | POA: Diagnosis not present

## 2017-04-12 DIAGNOSIS — Z7901 Long term (current) use of anticoagulants: Secondary | ICD-10-CM | POA: Diagnosis not present

## 2017-04-12 DIAGNOSIS — I878 Other specified disorders of veins: Secondary | ICD-10-CM | POA: Diagnosis not present

## 2017-04-12 DIAGNOSIS — T82868A Thrombosis of vascular prosthetic devices, implants and grafts, initial encounter: Secondary | ICD-10-CM | POA: Diagnosis not present

## 2017-04-12 DIAGNOSIS — R69 Illness, unspecified: Secondary | ICD-10-CM | POA: Diagnosis not present

## 2017-04-12 DIAGNOSIS — Z86711 Personal history of pulmonary embolism: Secondary | ICD-10-CM | POA: Diagnosis not present

## 2017-04-12 DIAGNOSIS — D6859 Other primary thrombophilia: Secondary | ICD-10-CM | POA: Diagnosis not present

## 2017-04-13 DIAGNOSIS — Z7901 Long term (current) use of anticoagulants: Secondary | ICD-10-CM | POA: Diagnosis not present

## 2017-04-13 DIAGNOSIS — Y831 Surgical operation with implant of artificial internal device as the cause of abnormal reaction of the patient, or of later complication, without mention of misadventure at the time of the procedure: Secondary | ICD-10-CM | POA: Diagnosis not present

## 2017-04-13 DIAGNOSIS — I82521 Chronic embolism and thrombosis of right iliac vein: Secondary | ICD-10-CM | POA: Diagnosis not present

## 2017-04-13 DIAGNOSIS — Z86711 Personal history of pulmonary embolism: Secondary | ICD-10-CM | POA: Diagnosis not present

## 2017-04-13 DIAGNOSIS — I878 Other specified disorders of veins: Secondary | ICD-10-CM | POA: Diagnosis not present

## 2017-04-13 DIAGNOSIS — R69 Illness, unspecified: Secondary | ICD-10-CM | POA: Diagnosis not present

## 2017-04-13 DIAGNOSIS — T82868A Thrombosis of vascular prosthetic devices, implants and grafts, initial encounter: Secondary | ICD-10-CM | POA: Diagnosis not present

## 2017-04-13 DIAGNOSIS — G8929 Other chronic pain: Secondary | ICD-10-CM | POA: Diagnosis not present

## 2017-04-13 DIAGNOSIS — D6859 Other primary thrombophilia: Secondary | ICD-10-CM | POA: Diagnosis not present

## 2017-04-13 DIAGNOSIS — R600 Localized edema: Secondary | ICD-10-CM | POA: Diagnosis not present

## 2017-04-18 DIAGNOSIS — R7301 Impaired fasting glucose: Secondary | ICD-10-CM | POA: Diagnosis not present

## 2017-04-18 DIAGNOSIS — Z86718 Personal history of other venous thrombosis and embolism: Secondary | ICD-10-CM | POA: Diagnosis not present

## 2017-04-23 DIAGNOSIS — I871 Compression of vein: Secondary | ICD-10-CM | POA: Diagnosis not present

## 2017-04-23 DIAGNOSIS — E669 Obesity, unspecified: Secondary | ICD-10-CM | POA: Diagnosis not present

## 2017-04-23 DIAGNOSIS — I8290 Acute embolism and thrombosis of unspecified vein: Secondary | ICD-10-CM | POA: Diagnosis not present

## 2017-04-23 DIAGNOSIS — I82513 Chronic embolism and thrombosis of femoral vein, bilateral: Secondary | ICD-10-CM | POA: Diagnosis not present

## 2017-04-23 DIAGNOSIS — Z86718 Personal history of other venous thrombosis and embolism: Secondary | ICD-10-CM | POA: Diagnosis not present

## 2017-04-23 DIAGNOSIS — M7989 Other specified soft tissue disorders: Secondary | ICD-10-CM | POA: Diagnosis not present

## 2017-04-23 DIAGNOSIS — Z7901 Long term (current) use of anticoagulants: Secondary | ICD-10-CM | POA: Diagnosis not present

## 2017-05-04 DIAGNOSIS — G894 Chronic pain syndrome: Secondary | ICD-10-CM | POA: Diagnosis not present

## 2017-05-04 DIAGNOSIS — M47817 Spondylosis without myelopathy or radiculopathy, lumbosacral region: Secondary | ICD-10-CM | POA: Diagnosis not present

## 2017-05-04 DIAGNOSIS — M25559 Pain in unspecified hip: Secondary | ICD-10-CM | POA: Diagnosis not present

## 2017-05-04 DIAGNOSIS — M545 Low back pain: Secondary | ICD-10-CM | POA: Diagnosis not present

## 2017-05-07 ENCOUNTER — Ambulatory Visit
Admission: RE | Admit: 2017-05-07 | Discharge: 2017-05-07 | Disposition: A | Discharge status: Home / Self Care | Payer: Medicare HMO | Source: Ambulatory Visit | Attending: Pain Medicine | Admitting: Pain Medicine

## 2017-05-07 DIAGNOSIS — M48061 Spinal stenosis, lumbar region without neurogenic claudication: Secondary | ICD-10-CM | POA: Diagnosis not present

## 2017-05-07 DIAGNOSIS — M545 Low back pain: Secondary | ICD-10-CM

## 2017-05-09 DIAGNOSIS — G4731 Primary central sleep apnea: Secondary | ICD-10-CM | POA: Diagnosis not present

## 2017-05-20 IMAGING — CR DG ELBOW COMPLETE 3+V*R*
4 series · 4 of 4 positions shown · non-contrast
Comparison: Radiographs April 16, 2014.

CLINICAL DATA: Right elbow pain for month without new injury.

EXAM:
RIGHT ELBOW - COMPLETE 3+ VIEW

[x elbow ap right]
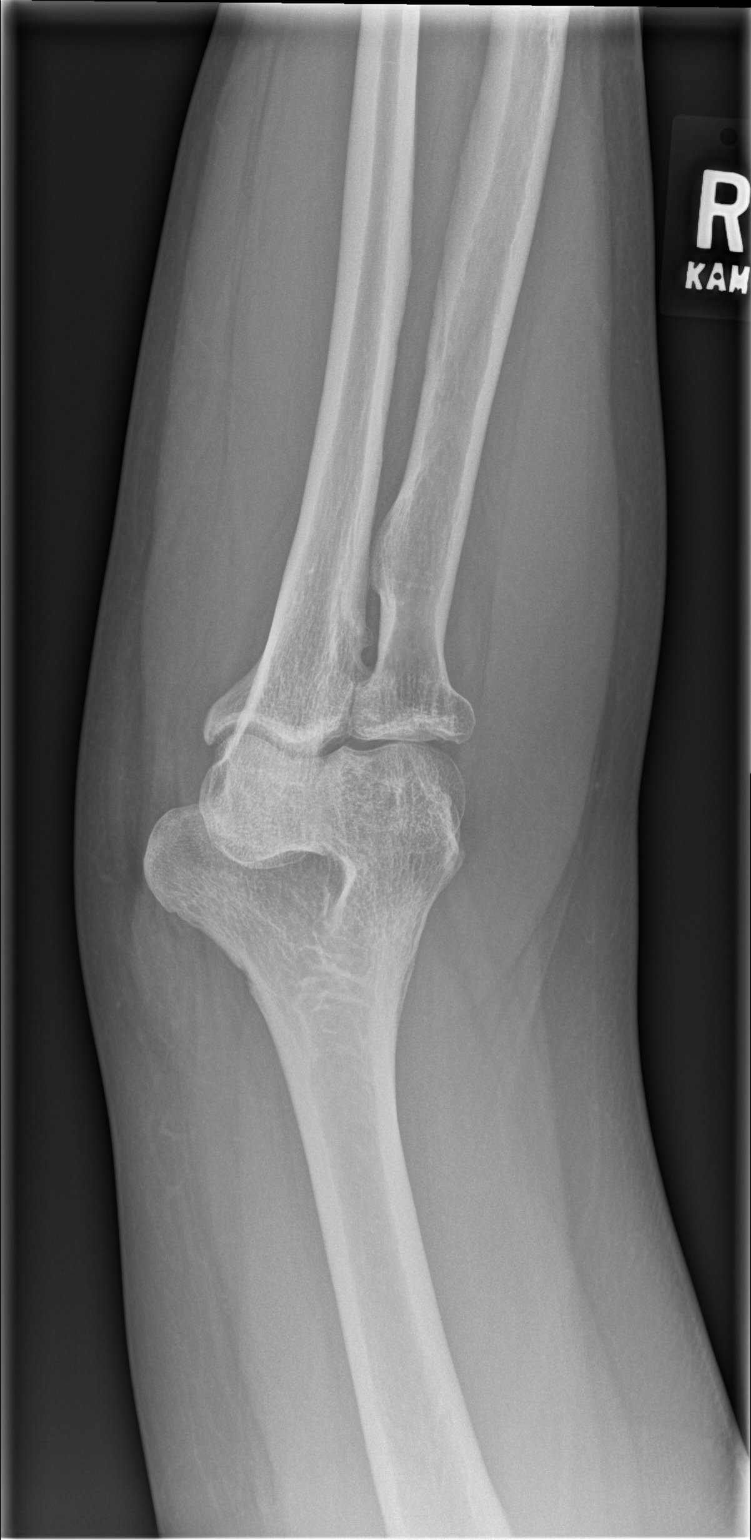

[x elbow obl right (1 of 2)]
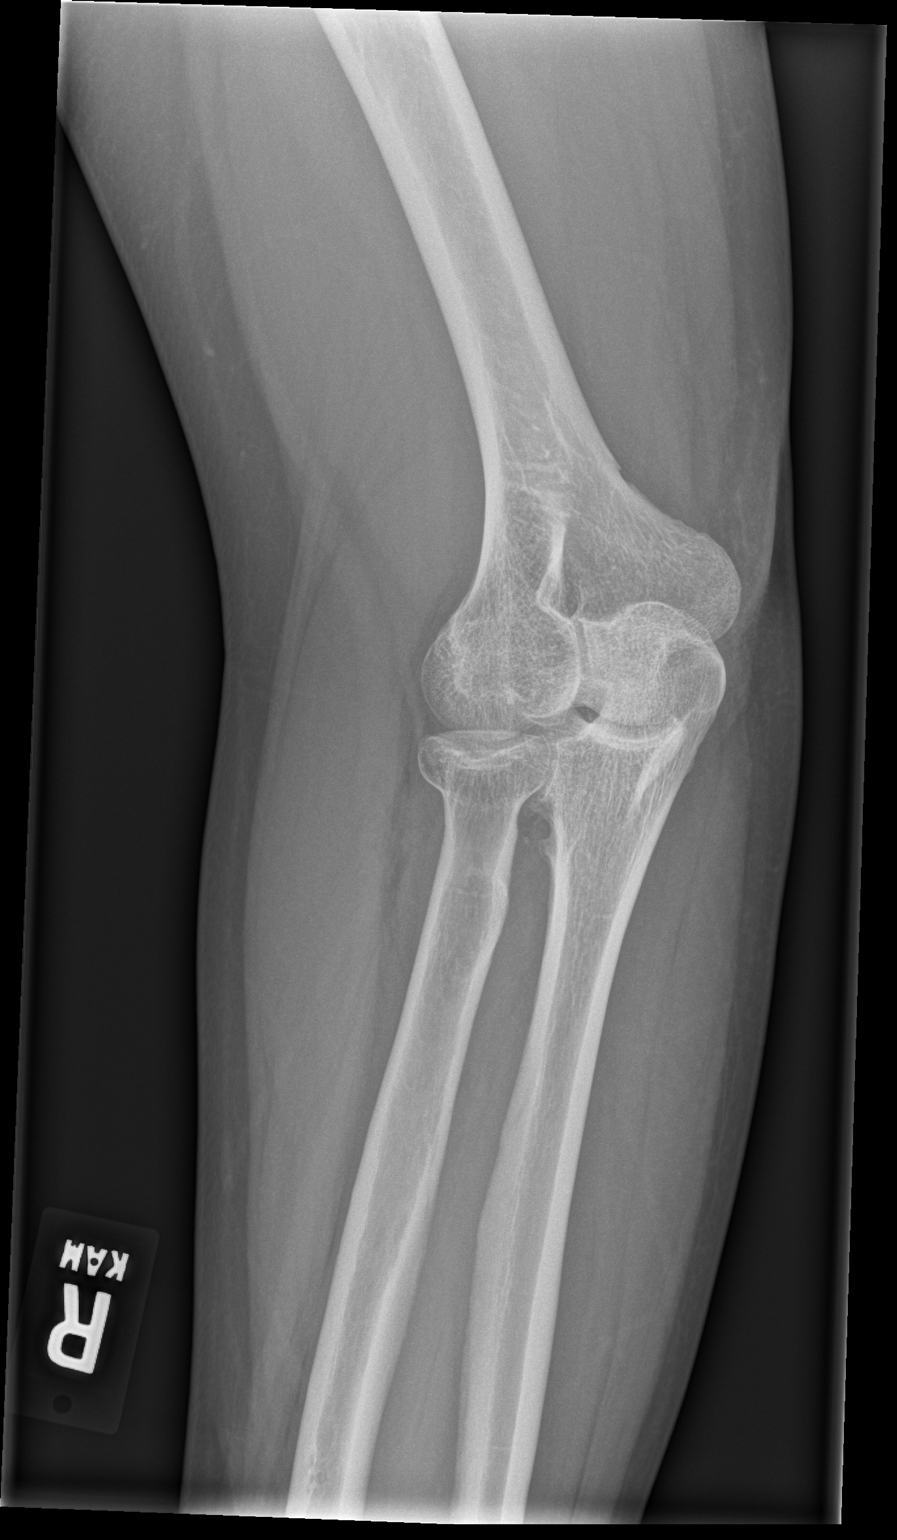

[x elbow obl right (2 of 2)]
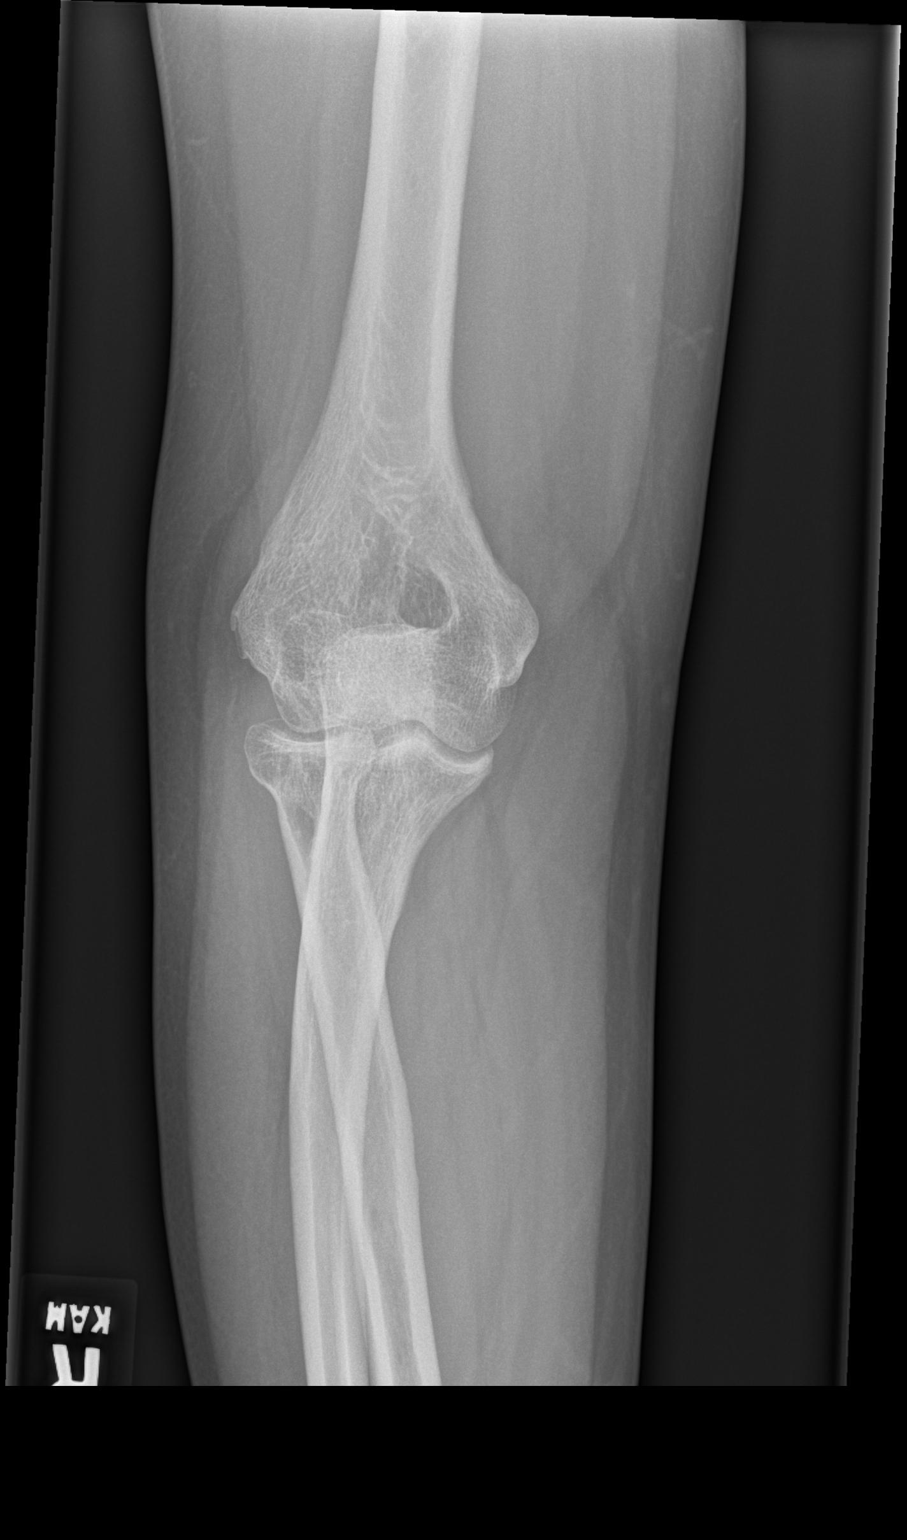

[x elbow lat right]
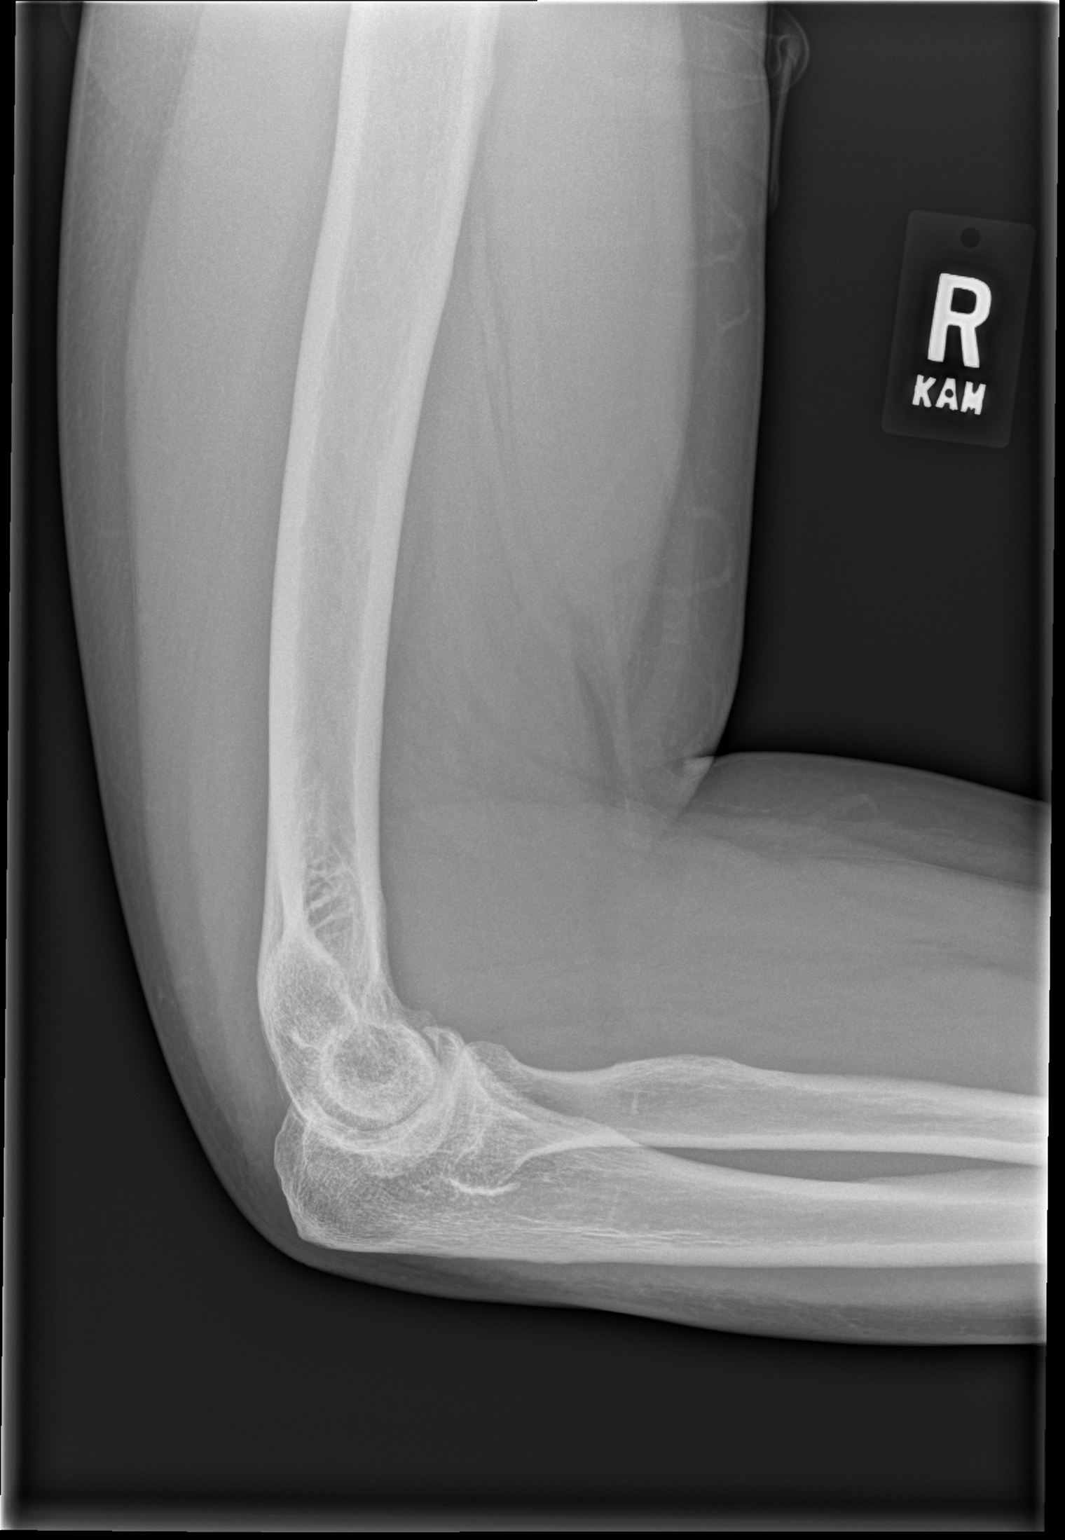

[4 of 4 positions shown; findings below may reference images not displayed]

FINDINGS: Stable deformity of right radial head consistent with old fracture.
No acute fracture or dislocation is noted. No joint effusion is
noted. No soft tissue abnormality is noted.
IMPRESSION: No acute abnormality seen in the right elbow.

## 2017-06-01 DIAGNOSIS — M25559 Pain in unspecified hip: Secondary | ICD-10-CM | POA: Diagnosis not present

## 2017-06-01 DIAGNOSIS — Z79891 Long term (current) use of opiate analgesic: Secondary | ICD-10-CM | POA: Diagnosis not present

## 2017-06-01 DIAGNOSIS — Z79899 Other long term (current) drug therapy: Secondary | ICD-10-CM | POA: Diagnosis not present

## 2017-06-01 DIAGNOSIS — G894 Chronic pain syndrome: Secondary | ICD-10-CM | POA: Diagnosis not present

## 2017-06-01 DIAGNOSIS — M545 Low back pain: Secondary | ICD-10-CM | POA: Diagnosis not present

## 2017-06-01 DIAGNOSIS — M47817 Spondylosis without myelopathy or radiculopathy, lumbosacral region: Secondary | ICD-10-CM | POA: Diagnosis not present

## 2017-06-09 DIAGNOSIS — G4731 Primary central sleep apnea: Secondary | ICD-10-CM | POA: Diagnosis not present

## 2017-07-09 DIAGNOSIS — G894 Chronic pain syndrome: Secondary | ICD-10-CM | POA: Diagnosis not present

## 2017-07-09 DIAGNOSIS — Z79899 Other long term (current) drug therapy: Secondary | ICD-10-CM | POA: Diagnosis not present

## 2017-07-09 DIAGNOSIS — Z79891 Long term (current) use of opiate analgesic: Secondary | ICD-10-CM | POA: Diagnosis not present

## 2017-07-09 DIAGNOSIS — M5136 Other intervertebral disc degeneration, lumbar region: Secondary | ICD-10-CM | POA: Diagnosis not present

## 2017-07-10 ENCOUNTER — Encounter: Payer: Self-pay | Admitting: Neurology

## 2017-07-10 ENCOUNTER — Ambulatory Visit (INDEPENDENT_AMBULATORY_CARE_PROVIDER_SITE_OTHER): Payer: Medicare HMO | Admitting: Neurology

## 2017-07-10 VITALS — BP 133/85 | HR 76 | Ht 75.0 in | Wt 307.0 lb

## 2017-07-10 DIAGNOSIS — G4719 Other hypersomnia: Secondary | ICD-10-CM

## 2017-07-10 DIAGNOSIS — F518 Other sleep disorders not due to a substance or known physiological condition: Secondary | ICD-10-CM | POA: Diagnosis not present

## 2017-07-10 DIAGNOSIS — G4753 Recurrent isolated sleep paralysis: Secondary | ICD-10-CM | POA: Diagnosis not present

## 2017-07-10 DIAGNOSIS — G5711 Meralgia paresthetica, right lower limb: Secondary | ICD-10-CM | POA: Diagnosis not present

## 2017-07-10 DIAGNOSIS — R69 Illness, unspecified: Secondary | ICD-10-CM | POA: Diagnosis not present

## 2017-07-10 DIAGNOSIS — R442 Other hallucinations: Secondary | ICD-10-CM | POA: Insufficient documentation

## 2017-07-10 DIAGNOSIS — G4731 Primary central sleep apnea: Secondary | ICD-10-CM | POA: Diagnosis not present

## 2017-07-10 NOTE — Patient Instructions (Addendum)
I believe you may have a condition called narcolepsy: This means, that you have a sleep disorder that manifests with at times severe excessive sleepiness during the day and often with problems with sleep at night. We may have to try different medications that may help you stay awake during the day. Not everything works with everybody the same way. Wake promoting agents include stimulants and non-stimulant type medications. The most common side effects with stimulants are weight loss, insomnia, nervousness, headaches, palpitations, rise in blood pressure, anxiety. Stimulants can be addictive and subject to abuse. Non-stimulant type wake promoting medications include Provigil and Nuvigil, most common side effects include headaches, nervousness, insomnia, hypertension. In addition there is a medication called Xyrem which has been proven to be very effective in patients with narcolepsy with or without cataplexy. Some patients with narcolepsy report episodes of weakness, such as jaw or facial weakness, legs giving out, feeling wobbly or like "Jell-o", etc. in situations of anxiety, stress, laughter, sudden sadness, surprise, etc., which is called cataplexy. You can also experience episodes of sleep paralysis during which you may feel unable to move upon awakening. Some people experience dreamlike sequences upon awakening or upon drifting off to sleep, called hypnopompic or hypnagogic hallucinations.  We will meet after PSG and MSLT ( narcolepsy test)   I also found your right femoral nerve branch is giving you sensory symptoms, this is the cutaneus lateral branch of the femoral nerve of your right lower extremity. Avoid tight fitting clothes , anything cutting into the groin line. Lower your blood sugar.
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
## 2017-07-10 NOTE — Addendum Note (Signed)
Addended by: Melvyn NovasHMEIER, Cherl Gorney on: 07/10/2017 03:00 PM   Modules accepted: Orders

## 2017-07-15 IMAGING — CR DG HIP (WITH OR WITHOUT PELVIS) 2-3V*R*
2 series · 2 of 2 positions shown · non-contrast
Comparison: None.

CLINICAL DATA: Chronic right hip pain without known injury.

EXAM:
DG HIP (WITH OR WITHOUT PELVIS) 2-3V RIGHT

[w hip ap right]
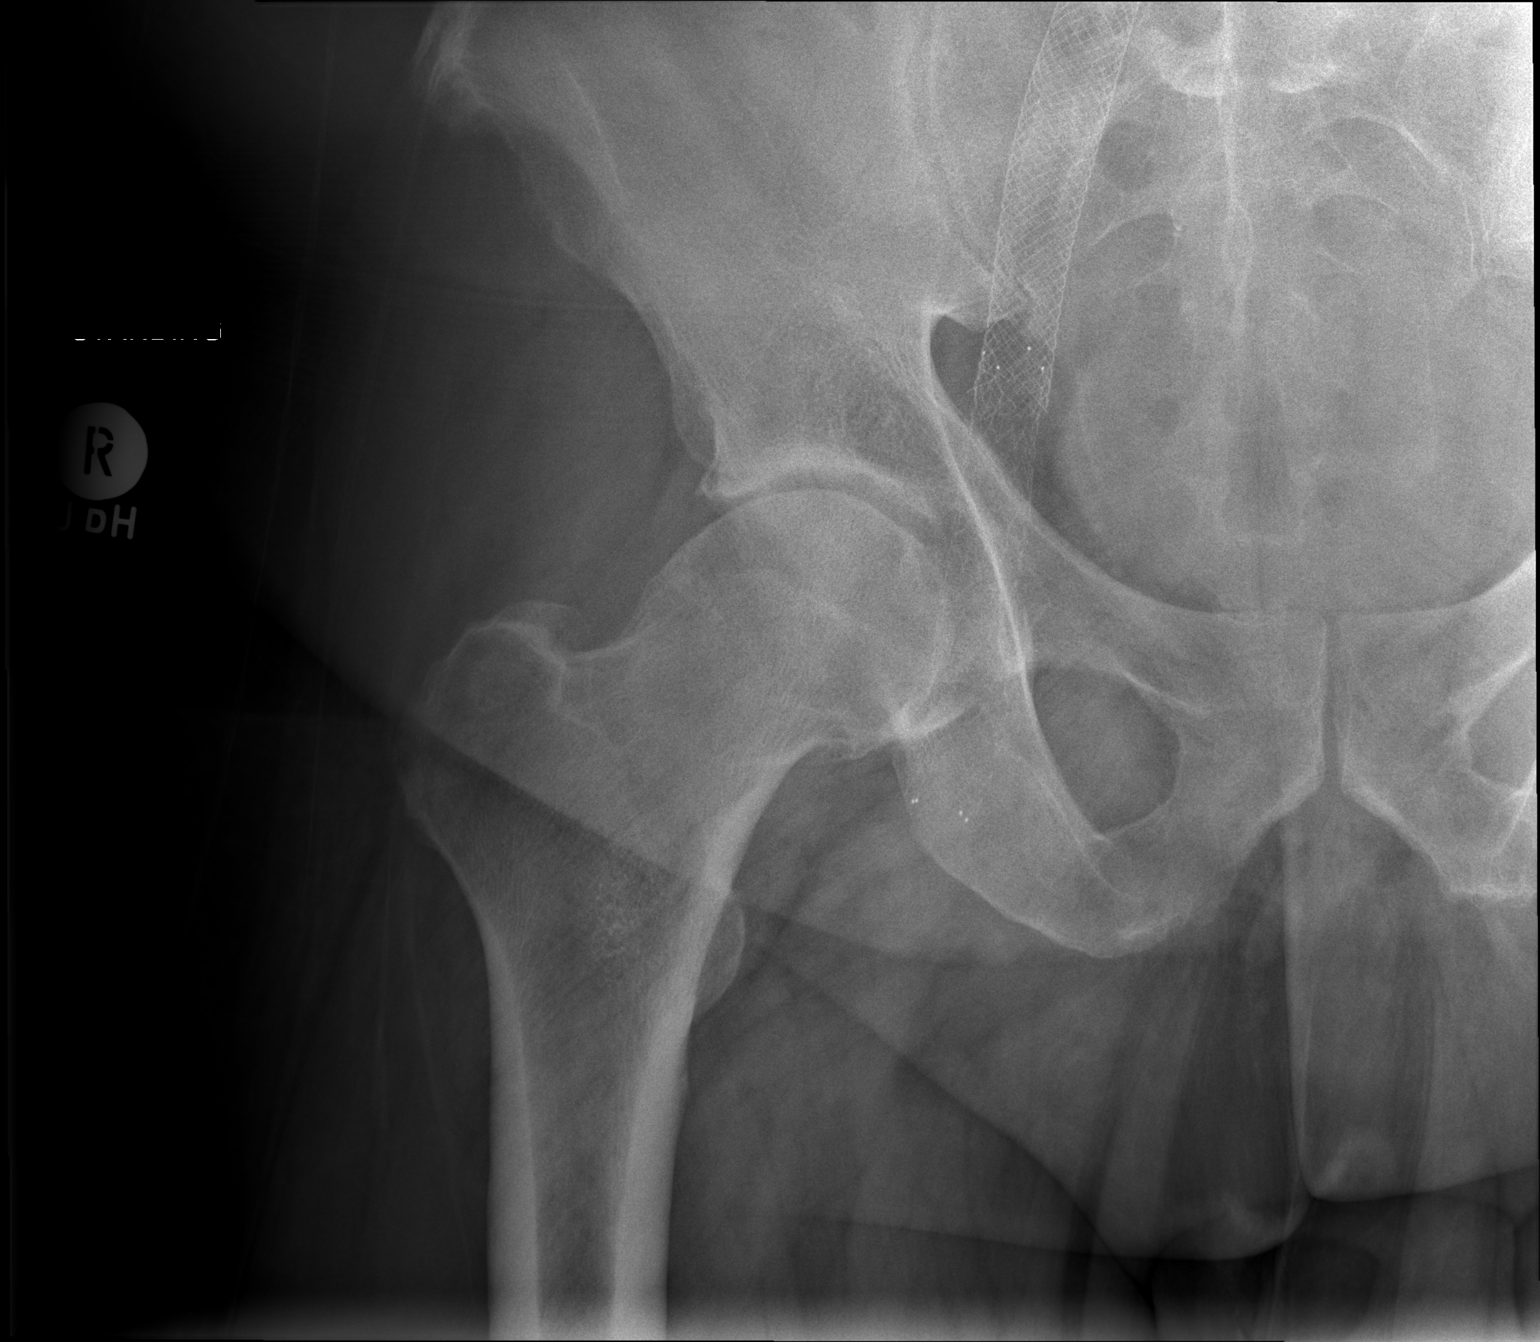

[w hip frog right]
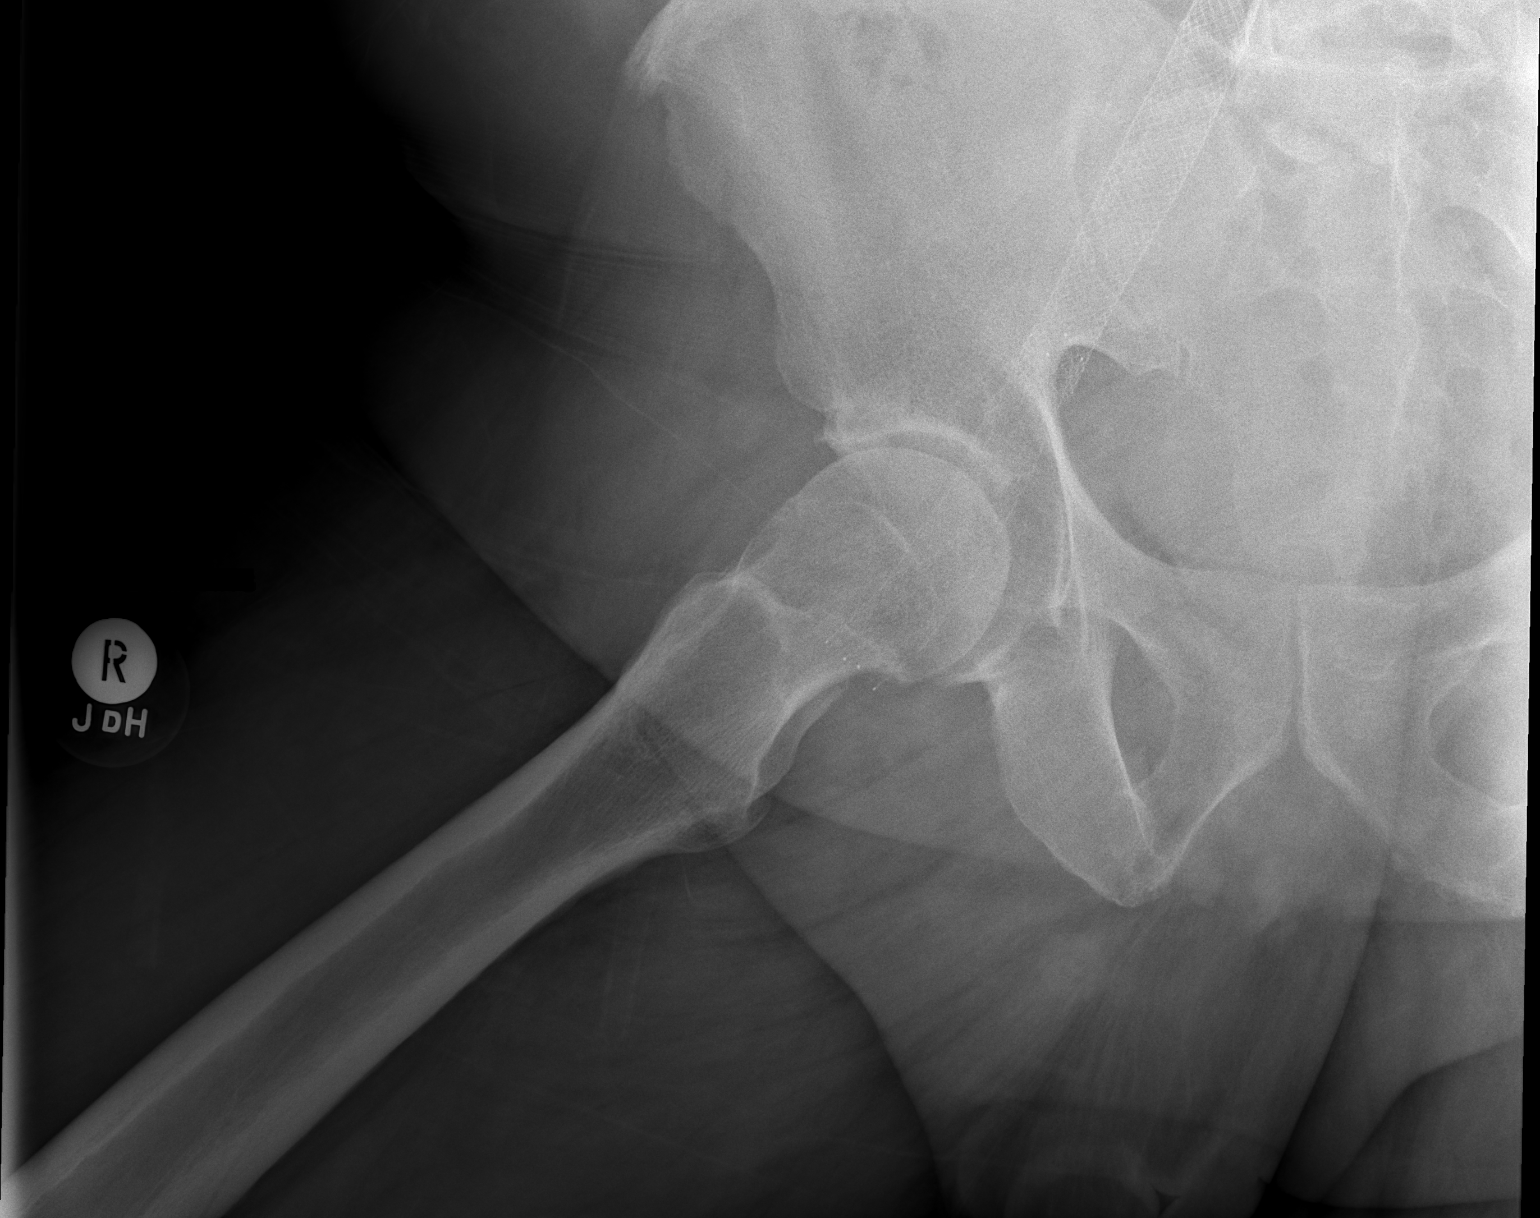

[2 of 2 positions shown; findings below may reference images not displayed]

FINDINGS: There is no evidence of hip fracture or dislocation. There is no
evidence of arthropathy or other focal bone abnormality.
IMPRESSION: Normal right hip.

## 2017-07-16 LAB — NARCOLEPSY EVALUATION
HLA-DQ ALPHA: NEGATIVE
HLA-DQ BETA: NEGATIVE

## 2017-07-17 ENCOUNTER — Telehealth: Payer: Self-pay | Admitting: Neurology

## 2017-07-17 NOTE — Telephone Encounter (Signed)
Called the patient to make him aware that the HLA narcolepsy lab test that we drew was negative. The patient will come in for the overnight and and nap test once the sleep lab reaches out to him to get that set up.

## 2017-07-17 NOTE — Telephone Encounter (Signed)
-----   Message from Melvyn Novas, MD sent at 07/16/2017  4:16 PM EDT ----- Negative- normal test for narcolepsy - CD

## 2017-08-06 DIAGNOSIS — M25559 Pain in unspecified hip: Secondary | ICD-10-CM | POA: Diagnosis not present

## 2017-08-06 DIAGNOSIS — Z79899 Other long term (current) drug therapy: Secondary | ICD-10-CM | POA: Diagnosis not present

## 2017-08-06 DIAGNOSIS — M47817 Spondylosis without myelopathy or radiculopathy, lumbosacral region: Secondary | ICD-10-CM | POA: Diagnosis not present

## 2017-08-06 DIAGNOSIS — G894 Chronic pain syndrome: Secondary | ICD-10-CM | POA: Diagnosis not present

## 2017-08-06 DIAGNOSIS — I87009 Postthrombotic syndrome without complications of unspecified extremity: Secondary | ICD-10-CM | POA: Diagnosis not present

## 2017-08-06 DIAGNOSIS — Z79891 Long term (current) use of opiate analgesic: Secondary | ICD-10-CM | POA: Diagnosis not present

## 2017-08-21 DIAGNOSIS — M25559 Pain in unspecified hip: Secondary | ICD-10-CM | POA: Diagnosis not present

## 2017-08-21 DIAGNOSIS — M545 Low back pain: Secondary | ICD-10-CM | POA: Diagnosis not present

## 2017-08-21 DIAGNOSIS — G894 Chronic pain syndrome: Secondary | ICD-10-CM | POA: Diagnosis not present

## 2017-08-21 DIAGNOSIS — M47817 Spondylosis without myelopathy or radiculopathy, lumbosacral region: Secondary | ICD-10-CM | POA: Diagnosis not present

## 2017-08-21 DIAGNOSIS — Z79891 Long term (current) use of opiate analgesic: Secondary | ICD-10-CM | POA: Diagnosis not present

## 2017-08-21 DIAGNOSIS — Z79899 Other long term (current) drug therapy: Secondary | ICD-10-CM | POA: Diagnosis not present

## 2017-08-23 ENCOUNTER — Telehealth: Payer: Self-pay | Admitting: Neurology

## 2017-08-23 NOTE — Telephone Encounter (Signed)
We have attempted to call the patient 2 times to schedule sleep study. Patient has been unavailable at the phone numbers we have on file and has not returned our calls. At this point we will send a letter asking pt to please contact the sleep lab to schedule their sleep study. If patient calls back we will schedule them for their sleep study. ° °

## 2017-09-03 DIAGNOSIS — M25559 Pain in unspecified hip: Secondary | ICD-10-CM | POA: Diagnosis not present

## 2017-09-03 DIAGNOSIS — M545 Low back pain: Secondary | ICD-10-CM | POA: Diagnosis not present

## 2017-09-03 DIAGNOSIS — G894 Chronic pain syndrome: Secondary | ICD-10-CM | POA: Diagnosis not present

## 2017-09-03 DIAGNOSIS — M47817 Spondylosis without myelopathy or radiculopathy, lumbosacral region: Secondary | ICD-10-CM | POA: Diagnosis not present

## 2017-09-03 DIAGNOSIS — Z79891 Long term (current) use of opiate analgesic: Secondary | ICD-10-CM | POA: Diagnosis not present

## 2017-09-03 DIAGNOSIS — Z79899 Other long term (current) drug therapy: Secondary | ICD-10-CM | POA: Diagnosis not present

## 2017-10-01 DIAGNOSIS — Z79899 Other long term (current) drug therapy: Secondary | ICD-10-CM | POA: Diagnosis not present

## 2017-10-01 DIAGNOSIS — G894 Chronic pain syndrome: Secondary | ICD-10-CM | POA: Diagnosis not present

## 2017-10-01 DIAGNOSIS — M25559 Pain in unspecified hip: Secondary | ICD-10-CM | POA: Diagnosis not present

## 2017-10-01 DIAGNOSIS — M545 Low back pain: Secondary | ICD-10-CM | POA: Diagnosis not present

## 2017-10-01 DIAGNOSIS — Z79891 Long term (current) use of opiate analgesic: Secondary | ICD-10-CM | POA: Diagnosis not present

## 2017-10-01 DIAGNOSIS — M47817 Spondylosis without myelopathy or radiculopathy, lumbosacral region: Secondary | ICD-10-CM | POA: Diagnosis not present

## 2017-10-31 DIAGNOSIS — Z79899 Other long term (current) drug therapy: Secondary | ICD-10-CM | POA: Diagnosis not present

## 2017-10-31 DIAGNOSIS — G894 Chronic pain syndrome: Secondary | ICD-10-CM | POA: Diagnosis not present

## 2017-10-31 DIAGNOSIS — M25559 Pain in unspecified hip: Secondary | ICD-10-CM | POA: Diagnosis not present

## 2017-10-31 DIAGNOSIS — M47817 Spondylosis without myelopathy or radiculopathy, lumbosacral region: Secondary | ICD-10-CM | POA: Diagnosis not present

## 2017-10-31 DIAGNOSIS — I87009 Postthrombotic syndrome without complications of unspecified extremity: Secondary | ICD-10-CM | POA: Diagnosis not present

## 2017-10-31 DIAGNOSIS — Z79891 Long term (current) use of opiate analgesic: Secondary | ICD-10-CM | POA: Diagnosis not present

## 2017-11-28 DIAGNOSIS — M25559 Pain in unspecified hip: Secondary | ICD-10-CM | POA: Diagnosis not present

## 2017-11-28 DIAGNOSIS — Z79891 Long term (current) use of opiate analgesic: Secondary | ICD-10-CM | POA: Diagnosis not present

## 2017-11-28 DIAGNOSIS — Z79899 Other long term (current) drug therapy: Secondary | ICD-10-CM | POA: Diagnosis not present

## 2017-11-28 DIAGNOSIS — M47817 Spondylosis without myelopathy or radiculopathy, lumbosacral region: Secondary | ICD-10-CM | POA: Diagnosis not present

## 2017-11-28 DIAGNOSIS — I87009 Postthrombotic syndrome without complications of unspecified extremity: Secondary | ICD-10-CM | POA: Diagnosis not present

## 2017-11-28 DIAGNOSIS — G894 Chronic pain syndrome: Secondary | ICD-10-CM | POA: Diagnosis not present

## 2017-12-12 ENCOUNTER — Encounter (HOSPITAL_BASED_OUTPATIENT_CLINIC_OR_DEPARTMENT_OTHER): Payer: Medicare HMO | Attending: Internal Medicine

## 2017-12-12 DIAGNOSIS — L97909 Non-pressure chronic ulcer of unspecified part of unspecified lower leg with unspecified severity: Secondary | ICD-10-CM | POA: Diagnosis not present

## 2017-12-12 DIAGNOSIS — I872 Venous insufficiency (chronic) (peripheral): Secondary | ICD-10-CM | POA: Diagnosis not present

## 2017-12-12 DIAGNOSIS — I83228 Varicose veins of left lower extremity with both ulcer of other part of lower extremity and inflammation: Secondary | ICD-10-CM | POA: Diagnosis not present

## 2017-12-12 DIAGNOSIS — L97822 Non-pressure chronic ulcer of other part of left lower leg with fat layer exposed: Secondary | ICD-10-CM | POA: Diagnosis not present

## 2017-12-19 DIAGNOSIS — I83228 Varicose veins of left lower extremity with both ulcer of other part of lower extremity and inflammation: Secondary | ICD-10-CM | POA: Diagnosis not present

## 2017-12-19 DIAGNOSIS — L97822 Non-pressure chronic ulcer of other part of left lower leg with fat layer exposed: Secondary | ICD-10-CM | POA: Diagnosis not present

## 2017-12-19 DIAGNOSIS — I872 Venous insufficiency (chronic) (peripheral): Secondary | ICD-10-CM | POA: Diagnosis not present

## 2017-12-26 DIAGNOSIS — M25559 Pain in unspecified hip: Secondary | ICD-10-CM | POA: Diagnosis not present

## 2017-12-26 DIAGNOSIS — L97909 Non-pressure chronic ulcer of unspecified part of unspecified lower leg with unspecified severity: Secondary | ICD-10-CM | POA: Diagnosis not present

## 2017-12-26 DIAGNOSIS — L97822 Non-pressure chronic ulcer of other part of left lower leg with fat layer exposed: Secondary | ICD-10-CM | POA: Diagnosis not present

## 2017-12-26 DIAGNOSIS — M47817 Spondylosis without myelopathy or radiculopathy, lumbosacral region: Secondary | ICD-10-CM | POA: Diagnosis not present

## 2017-12-26 DIAGNOSIS — I872 Venous insufficiency (chronic) (peripheral): Secondary | ICD-10-CM | POA: Diagnosis not present

## 2017-12-26 DIAGNOSIS — G894 Chronic pain syndrome: Secondary | ICD-10-CM | POA: Diagnosis not present

## 2017-12-26 DIAGNOSIS — I83228 Varicose veins of left lower extremity with both ulcer of other part of lower extremity and inflammation: Secondary | ICD-10-CM | POA: Diagnosis not present

## 2017-12-26 DIAGNOSIS — I87009 Postthrombotic syndrome without complications of unspecified extremity: Secondary | ICD-10-CM | POA: Diagnosis not present

## 2018-01-02 ENCOUNTER — Encounter (HOSPITAL_BASED_OUTPATIENT_CLINIC_OR_DEPARTMENT_OTHER): Payer: Medicare HMO | Attending: Physician Assistant

## 2018-01-02 DIAGNOSIS — I83228 Varicose veins of left lower extremity with both ulcer of other part of lower extremity and inflammation: Secondary | ICD-10-CM | POA: Diagnosis present

## 2018-01-02 DIAGNOSIS — L97822 Non-pressure chronic ulcer of other part of left lower leg with fat layer exposed: Secondary | ICD-10-CM | POA: Diagnosis not present

## 2018-01-02 DIAGNOSIS — Z87891 Personal history of nicotine dependence: Secondary | ICD-10-CM | POA: Diagnosis not present

## 2018-01-02 DIAGNOSIS — I872 Venous insufficiency (chronic) (peripheral): Secondary | ICD-10-CM | POA: Diagnosis not present

## 2018-01-09 DIAGNOSIS — Z87891 Personal history of nicotine dependence: Secondary | ICD-10-CM | POA: Diagnosis not present

## 2018-01-09 DIAGNOSIS — I872 Venous insufficiency (chronic) (peripheral): Secondary | ICD-10-CM | POA: Diagnosis not present

## 2018-01-09 DIAGNOSIS — L97822 Non-pressure chronic ulcer of other part of left lower leg with fat layer exposed: Secondary | ICD-10-CM | POA: Diagnosis not present

## 2018-01-09 DIAGNOSIS — I83228 Varicose veins of left lower extremity with both ulcer of other part of lower extremity and inflammation: Secondary | ICD-10-CM | POA: Diagnosis not present

## 2018-01-16 DIAGNOSIS — Z87891 Personal history of nicotine dependence: Secondary | ICD-10-CM | POA: Diagnosis not present

## 2018-01-16 DIAGNOSIS — I83228 Varicose veins of left lower extremity with both ulcer of other part of lower extremity and inflammation: Secondary | ICD-10-CM | POA: Diagnosis not present

## 2018-01-16 DIAGNOSIS — I872 Venous insufficiency (chronic) (peripheral): Secondary | ICD-10-CM | POA: Diagnosis not present

## 2018-01-16 DIAGNOSIS — L97822 Non-pressure chronic ulcer of other part of left lower leg with fat layer exposed: Secondary | ICD-10-CM | POA: Diagnosis not present

## 2018-01-23 DIAGNOSIS — L97822 Non-pressure chronic ulcer of other part of left lower leg with fat layer exposed: Secondary | ICD-10-CM | POA: Diagnosis not present

## 2018-01-23 DIAGNOSIS — I83228 Varicose veins of left lower extremity with both ulcer of other part of lower extremity and inflammation: Secondary | ICD-10-CM | POA: Diagnosis not present

## 2018-01-23 DIAGNOSIS — G894 Chronic pain syndrome: Secondary | ICD-10-CM | POA: Diagnosis not present

## 2018-01-23 DIAGNOSIS — M25559 Pain in unspecified hip: Secondary | ICD-10-CM | POA: Diagnosis not present

## 2018-01-23 DIAGNOSIS — Z79899 Other long term (current) drug therapy: Secondary | ICD-10-CM | POA: Diagnosis not present

## 2018-01-23 DIAGNOSIS — Z87891 Personal history of nicotine dependence: Secondary | ICD-10-CM | POA: Diagnosis not present

## 2018-01-23 DIAGNOSIS — L97909 Non-pressure chronic ulcer of unspecified part of unspecified lower leg with unspecified severity: Secondary | ICD-10-CM | POA: Diagnosis not present

## 2018-01-23 DIAGNOSIS — I872 Venous insufficiency (chronic) (peripheral): Secondary | ICD-10-CM | POA: Diagnosis not present

## 2018-01-23 DIAGNOSIS — Z79891 Long term (current) use of opiate analgesic: Secondary | ICD-10-CM | POA: Diagnosis not present

## 2018-01-28 DIAGNOSIS — R69 Illness, unspecified: Secondary | ICD-10-CM | POA: Diagnosis not present

## 2018-01-30 ENCOUNTER — Encounter (HOSPITAL_BASED_OUTPATIENT_CLINIC_OR_DEPARTMENT_OTHER): Payer: Medicare HMO | Attending: Physician Assistant

## 2018-01-30 DIAGNOSIS — L97822 Non-pressure chronic ulcer of other part of left lower leg with fat layer exposed: Secondary | ICD-10-CM | POA: Insufficient documentation

## 2018-01-30 DIAGNOSIS — I83228 Varicose veins of left lower extremity with both ulcer of other part of lower extremity and inflammation: Secondary | ICD-10-CM | POA: Diagnosis not present

## 2018-01-30 DIAGNOSIS — I872 Venous insufficiency (chronic) (peripheral): Secondary | ICD-10-CM | POA: Diagnosis not present

## 2018-01-30 DIAGNOSIS — Z87891 Personal history of nicotine dependence: Secondary | ICD-10-CM | POA: Diagnosis not present

## 2018-02-06 DIAGNOSIS — Z87891 Personal history of nicotine dependence: Secondary | ICD-10-CM | POA: Diagnosis not present

## 2018-02-06 DIAGNOSIS — I83228 Varicose veins of left lower extremity with both ulcer of other part of lower extremity and inflammation: Secondary | ICD-10-CM | POA: Diagnosis not present

## 2018-02-06 DIAGNOSIS — L97822 Non-pressure chronic ulcer of other part of left lower leg with fat layer exposed: Secondary | ICD-10-CM | POA: Diagnosis not present

## 2018-02-06 DIAGNOSIS — L97909 Non-pressure chronic ulcer of unspecified part of unspecified lower leg with unspecified severity: Secondary | ICD-10-CM | POA: Diagnosis not present

## 2018-02-08 DIAGNOSIS — R69 Illness, unspecified: Secondary | ICD-10-CM | POA: Diagnosis not present

## 2018-02-13 DIAGNOSIS — I872 Venous insufficiency (chronic) (peripheral): Secondary | ICD-10-CM | POA: Diagnosis not present

## 2018-02-13 DIAGNOSIS — I83228 Varicose veins of left lower extremity with both ulcer of other part of lower extremity and inflammation: Secondary | ICD-10-CM | POA: Diagnosis not present

## 2018-02-13 DIAGNOSIS — Z87891 Personal history of nicotine dependence: Secondary | ICD-10-CM | POA: Diagnosis not present

## 2018-02-13 DIAGNOSIS — L97822 Non-pressure chronic ulcer of other part of left lower leg with fat layer exposed: Secondary | ICD-10-CM | POA: Diagnosis not present

## 2018-02-20 DIAGNOSIS — Z79899 Other long term (current) drug therapy: Secondary | ICD-10-CM | POA: Diagnosis not present

## 2018-02-20 DIAGNOSIS — I83228 Varicose veins of left lower extremity with both ulcer of other part of lower extremity and inflammation: Secondary | ICD-10-CM | POA: Diagnosis not present

## 2018-02-20 DIAGNOSIS — L97822 Non-pressure chronic ulcer of other part of left lower leg with fat layer exposed: Secondary | ICD-10-CM | POA: Diagnosis not present

## 2018-02-20 DIAGNOSIS — L97909 Non-pressure chronic ulcer of unspecified part of unspecified lower leg with unspecified severity: Secondary | ICD-10-CM | POA: Diagnosis not present

## 2018-02-20 DIAGNOSIS — Z87891 Personal history of nicotine dependence: Secondary | ICD-10-CM | POA: Diagnosis not present

## 2018-02-20 DIAGNOSIS — G894 Chronic pain syndrome: Secondary | ICD-10-CM | POA: Diagnosis not present

## 2018-02-20 DIAGNOSIS — Z79891 Long term (current) use of opiate analgesic: Secondary | ICD-10-CM | POA: Diagnosis not present

## 2018-02-20 DIAGNOSIS — M47817 Spondylosis without myelopathy or radiculopathy, lumbosacral region: Secondary | ICD-10-CM | POA: Diagnosis not present

## 2018-02-20 DIAGNOSIS — M545 Low back pain: Secondary | ICD-10-CM | POA: Diagnosis not present

## 2018-02-20 DIAGNOSIS — I87009 Postthrombotic syndrome without complications of unspecified extremity: Secondary | ICD-10-CM | POA: Diagnosis not present

## 2018-02-25 DIAGNOSIS — R69 Illness, unspecified: Secondary | ICD-10-CM | POA: Diagnosis not present

## 2018-02-27 ENCOUNTER — Encounter (HOSPITAL_BASED_OUTPATIENT_CLINIC_OR_DEPARTMENT_OTHER): Payer: Medicare HMO | Attending: Physician Assistant

## 2018-02-27 DIAGNOSIS — Z87891 Personal history of nicotine dependence: Secondary | ICD-10-CM | POA: Diagnosis not present

## 2018-02-27 DIAGNOSIS — L97822 Non-pressure chronic ulcer of other part of left lower leg with fat layer exposed: Secondary | ICD-10-CM | POA: Diagnosis not present

## 2018-02-27 DIAGNOSIS — I83228 Varicose veins of left lower extremity with both ulcer of other part of lower extremity and inflammation: Secondary | ICD-10-CM | POA: Diagnosis not present

## 2018-03-19 DIAGNOSIS — R69 Illness, unspecified: Secondary | ICD-10-CM | POA: Diagnosis not present

## 2018-03-20 DIAGNOSIS — Z79891 Long term (current) use of opiate analgesic: Secondary | ICD-10-CM | POA: Diagnosis not present

## 2018-03-20 DIAGNOSIS — I87009 Postthrombotic syndrome without complications of unspecified extremity: Secondary | ICD-10-CM | POA: Diagnosis not present

## 2018-03-20 DIAGNOSIS — M545 Low back pain: Secondary | ICD-10-CM | POA: Diagnosis not present

## 2018-03-20 DIAGNOSIS — G894 Chronic pain syndrome: Secondary | ICD-10-CM | POA: Diagnosis not present

## 2018-03-20 DIAGNOSIS — M47817 Spondylosis without myelopathy or radiculopathy, lumbosacral region: Secondary | ICD-10-CM | POA: Diagnosis not present

## 2018-03-20 DIAGNOSIS — Z79899 Other long term (current) drug therapy: Secondary | ICD-10-CM | POA: Diagnosis not present

## 2018-04-02 DIAGNOSIS — R69 Illness, unspecified: Secondary | ICD-10-CM | POA: Diagnosis not present

## 2018-04-17 DIAGNOSIS — I87009 Postthrombotic syndrome without complications of unspecified extremity: Secondary | ICD-10-CM | POA: Diagnosis not present

## 2018-04-17 DIAGNOSIS — M47817 Spondylosis without myelopathy or radiculopathy, lumbosacral region: Secondary | ICD-10-CM | POA: Diagnosis not present

## 2018-04-17 DIAGNOSIS — G894 Chronic pain syndrome: Secondary | ICD-10-CM | POA: Diagnosis not present

## 2018-04-17 DIAGNOSIS — M25559 Pain in unspecified hip: Secondary | ICD-10-CM | POA: Diagnosis not present

## 2018-05-15 DIAGNOSIS — M79606 Pain in leg, unspecified: Secondary | ICD-10-CM | POA: Diagnosis not present

## 2018-05-15 DIAGNOSIS — Z79891 Long term (current) use of opiate analgesic: Secondary | ICD-10-CM | POA: Diagnosis not present

## 2018-05-15 DIAGNOSIS — G894 Chronic pain syndrome: Secondary | ICD-10-CM | POA: Diagnosis not present

## 2018-05-15 DIAGNOSIS — Z79899 Other long term (current) drug therapy: Secondary | ICD-10-CM | POA: Diagnosis not present

## 2018-05-21 DIAGNOSIS — D6859 Other primary thrombophilia: Secondary | ICD-10-CM | POA: Diagnosis not present

## 2018-05-21 DIAGNOSIS — S22049A Unspecified fracture of fourth thoracic vertebra, initial encounter for closed fracture: Secondary | ICD-10-CM | POA: Diagnosis not present

## 2018-05-21 DIAGNOSIS — G894 Chronic pain syndrome: Secondary | ICD-10-CM | POA: Diagnosis not present

## 2018-05-21 DIAGNOSIS — J80 Acute respiratory distress syndrome: Secondary | ICD-10-CM | POA: Diagnosis not present

## 2018-05-21 DIAGNOSIS — W19XXXA Unspecified fall, initial encounter: Secondary | ICD-10-CM | POA: Diagnosis not present

## 2018-05-21 DIAGNOSIS — M5489 Other dorsalgia: Secondary | ICD-10-CM | POA: Diagnosis not present

## 2018-05-21 DIAGNOSIS — Z7409 Other reduced mobility: Secondary | ICD-10-CM | POA: Diagnosis not present

## 2018-05-21 DIAGNOSIS — W109XXA Fall (on) (from) unspecified stairs and steps, initial encounter: Secondary | ICD-10-CM | POA: Diagnosis not present

## 2018-05-21 DIAGNOSIS — R269 Unspecified abnormalities of gait and mobility: Secondary | ICD-10-CM | POA: Diagnosis not present

## 2018-05-21 DIAGNOSIS — G4731 Primary central sleep apnea: Secondary | ICD-10-CM | POA: Diagnosis not present

## 2018-05-21 DIAGNOSIS — S32018A Other fracture of first lumbar vertebra, initial encounter for closed fracture: Secondary | ICD-10-CM | POA: Diagnosis not present

## 2018-05-21 DIAGNOSIS — S32038A Other fracture of third lumbar vertebra, initial encounter for closed fracture: Secondary | ICD-10-CM | POA: Diagnosis not present

## 2018-05-21 DIAGNOSIS — Y999 Unspecified external cause status: Secondary | ICD-10-CM | POA: Diagnosis not present

## 2018-05-21 DIAGNOSIS — M791 Myalgia, unspecified site: Secondary | ICD-10-CM | POA: Diagnosis not present

## 2018-05-21 DIAGNOSIS — R2681 Unsteadiness on feet: Secondary | ICD-10-CM | POA: Diagnosis not present

## 2018-05-21 DIAGNOSIS — M545 Low back pain: Secondary | ICD-10-CM | POA: Diagnosis not present

## 2018-05-21 DIAGNOSIS — Z7982 Long term (current) use of aspirin: Secondary | ICD-10-CM | POA: Diagnosis not present

## 2018-05-21 DIAGNOSIS — S32028A Other fracture of second lumbar vertebra, initial encounter for closed fracture: Secondary | ICD-10-CM | POA: Diagnosis not present

## 2018-05-21 DIAGNOSIS — S22088A Other fracture of T11-T12 vertebra, initial encounter for closed fracture: Secondary | ICD-10-CM | POA: Diagnosis not present

## 2018-05-21 DIAGNOSIS — W108XXA Fall (on) (from) other stairs and steps, initial encounter: Secondary | ICD-10-CM | POA: Diagnosis not present

## 2018-05-22 DIAGNOSIS — D6859 Other primary thrombophilia: Secondary | ICD-10-CM | POA: Diagnosis not present

## 2018-05-22 DIAGNOSIS — S22089A Unspecified fracture of T11-T12 vertebra, initial encounter for closed fracture: Secondary | ICD-10-CM | POA: Diagnosis not present

## 2018-05-22 DIAGNOSIS — G894 Chronic pain syndrome: Secondary | ICD-10-CM | POA: Diagnosis not present

## 2018-05-22 DIAGNOSIS — S22040A Wedge compression fracture of fourth thoracic vertebra, initial encounter for closed fracture: Secondary | ICD-10-CM | POA: Diagnosis not present

## 2018-05-22 DIAGNOSIS — S32029A Unspecified fracture of second lumbar vertebra, initial encounter for closed fracture: Secondary | ICD-10-CM | POA: Diagnosis not present

## 2018-05-22 DIAGNOSIS — S32019A Unspecified fracture of first lumbar vertebra, initial encounter for closed fracture: Secondary | ICD-10-CM | POA: Diagnosis not present

## 2018-05-22 DIAGNOSIS — S32039A Unspecified fracture of third lumbar vertebra, initial encounter for closed fracture: Secondary | ICD-10-CM | POA: Diagnosis not present

## 2018-05-22 DIAGNOSIS — S22049A Unspecified fracture of fourth thoracic vertebra, initial encounter for closed fracture: Secondary | ICD-10-CM | POA: Diagnosis not present

## 2018-05-22 DIAGNOSIS — G4731 Primary central sleep apnea: Secondary | ICD-10-CM | POA: Diagnosis not present

## 2018-05-22 DIAGNOSIS — W19XXXA Unspecified fall, initial encounter: Secondary | ICD-10-CM | POA: Diagnosis not present

## 2018-05-23 DIAGNOSIS — G4731 Primary central sleep apnea: Secondary | ICD-10-CM | POA: Diagnosis not present

## 2018-05-23 DIAGNOSIS — G894 Chronic pain syndrome: Secondary | ICD-10-CM | POA: Diagnosis not present

## 2018-05-23 DIAGNOSIS — Z7409 Other reduced mobility: Secondary | ICD-10-CM | POA: Diagnosis not present

## 2018-05-23 DIAGNOSIS — M545 Low back pain: Secondary | ICD-10-CM | POA: Diagnosis not present

## 2018-05-23 DIAGNOSIS — S22040A Wedge compression fracture of fourth thoracic vertebra, initial encounter for closed fracture: Secondary | ICD-10-CM | POA: Diagnosis not present

## 2018-05-23 DIAGNOSIS — S22049A Unspecified fracture of fourth thoracic vertebra, initial encounter for closed fracture: Secondary | ICD-10-CM | POA: Diagnosis not present

## 2018-05-23 DIAGNOSIS — D6859 Other primary thrombophilia: Secondary | ICD-10-CM | POA: Diagnosis not present

## 2018-05-24 DIAGNOSIS — G894 Chronic pain syndrome: Secondary | ICD-10-CM | POA: Diagnosis not present

## 2018-05-24 DIAGNOSIS — D6859 Other primary thrombophilia: Secondary | ICD-10-CM | POA: Diagnosis not present

## 2018-05-24 DIAGNOSIS — S22049A Unspecified fracture of fourth thoracic vertebra, initial encounter for closed fracture: Secondary | ICD-10-CM | POA: Diagnosis not present

## 2018-05-24 DIAGNOSIS — G4731 Primary central sleep apnea: Secondary | ICD-10-CM | POA: Diagnosis not present

## 2018-05-24 DIAGNOSIS — Z7409 Other reduced mobility: Secondary | ICD-10-CM | POA: Diagnosis not present

## 2018-05-25 DIAGNOSIS — Z7409 Other reduced mobility: Secondary | ICD-10-CM | POA: Diagnosis not present

## 2018-05-25 DIAGNOSIS — S22009A Unspecified fracture of unspecified thoracic vertebra, initial encounter for closed fracture: Secondary | ICD-10-CM | POA: Diagnosis not present

## 2018-05-25 DIAGNOSIS — G4731 Primary central sleep apnea: Secondary | ICD-10-CM | POA: Diagnosis not present

## 2018-05-25 DIAGNOSIS — D6859 Other primary thrombophilia: Secondary | ICD-10-CM | POA: Diagnosis not present

## 2018-05-25 DIAGNOSIS — S22049A Unspecified fracture of fourth thoracic vertebra, initial encounter for closed fracture: Secondary | ICD-10-CM | POA: Diagnosis not present

## 2018-05-25 DIAGNOSIS — G894 Chronic pain syndrome: Secondary | ICD-10-CM | POA: Diagnosis not present

## 2018-05-26 DIAGNOSIS — G894 Chronic pain syndrome: Secondary | ICD-10-CM | POA: Diagnosis not present

## 2018-05-26 DIAGNOSIS — D6859 Other primary thrombophilia: Secondary | ICD-10-CM | POA: Diagnosis not present

## 2018-05-26 DIAGNOSIS — S22049A Unspecified fracture of fourth thoracic vertebra, initial encounter for closed fracture: Secondary | ICD-10-CM | POA: Diagnosis not present

## 2018-05-26 DIAGNOSIS — Z7409 Other reduced mobility: Secondary | ICD-10-CM | POA: Diagnosis not present

## 2018-05-26 DIAGNOSIS — G4731 Primary central sleep apnea: Secondary | ICD-10-CM | POA: Diagnosis not present

## 2018-05-27 DIAGNOSIS — W19XXXA Unspecified fall, initial encounter: Secondary | ICD-10-CM | POA: Diagnosis not present

## 2018-05-27 DIAGNOSIS — G894 Chronic pain syndrome: Secondary | ICD-10-CM | POA: Diagnosis not present

## 2018-05-27 DIAGNOSIS — S22049A Unspecified fracture of fourth thoracic vertebra, initial encounter for closed fracture: Secondary | ICD-10-CM | POA: Diagnosis not present

## 2018-05-27 DIAGNOSIS — A009 Cholera, unspecified: Secondary | ICD-10-CM | POA: Diagnosis not present

## 2018-05-27 DIAGNOSIS — Z7409 Other reduced mobility: Secondary | ICD-10-CM | POA: Diagnosis not present

## 2018-05-27 DIAGNOSIS — G4737 Central sleep apnea in conditions classified elsewhere: Secondary | ICD-10-CM | POA: Diagnosis not present

## 2018-05-27 DIAGNOSIS — D6859 Other primary thrombophilia: Secondary | ICD-10-CM | POA: Diagnosis not present

## 2018-05-27 DIAGNOSIS — S22040A Wedge compression fracture of fourth thoracic vertebra, initial encounter for closed fracture: Secondary | ICD-10-CM | POA: Diagnosis not present

## 2018-05-29 DIAGNOSIS — S22040D Wedge compression fracture of fourth thoracic vertebra, subsequent encounter for fracture with routine healing: Secondary | ICD-10-CM | POA: Diagnosis not present

## 2018-05-29 DIAGNOSIS — M8580 Other specified disorders of bone density and structure, unspecified site: Secondary | ICD-10-CM | POA: Diagnosis not present

## 2018-05-29 DIAGNOSIS — M438X4 Other specified deforming dorsopathies, thoracic region: Secondary | ICD-10-CM | POA: Diagnosis not present

## 2018-05-29 DIAGNOSIS — G894 Chronic pain syndrome: Secondary | ICD-10-CM | POA: Diagnosis not present

## 2018-05-29 DIAGNOSIS — M40204 Unspecified kyphosis, thoracic region: Secondary | ICD-10-CM | POA: Diagnosis not present

## 2018-05-29 DIAGNOSIS — S32018D Other fracture of first lumbar vertebra, subsequent encounter for fracture with routine healing: Secondary | ICD-10-CM | POA: Diagnosis not present

## 2018-05-29 DIAGNOSIS — W109XXD Fall (on) (from) unspecified stairs and steps, subsequent encounter: Secondary | ICD-10-CM | POA: Diagnosis not present

## 2018-05-29 DIAGNOSIS — S32038D Other fracture of third lumbar vertebra, subsequent encounter for fracture with routine healing: Secondary | ICD-10-CM | POA: Diagnosis not present

## 2018-05-29 DIAGNOSIS — S22088D Other fracture of T11-T12 vertebra, subsequent encounter for fracture with routine healing: Secondary | ICD-10-CM | POA: Diagnosis not present

## 2018-05-29 DIAGNOSIS — S32028D Other fracture of second lumbar vertebra, subsequent encounter for fracture with routine healing: Secondary | ICD-10-CM | POA: Diagnosis not present

## 2018-06-07 DIAGNOSIS — S22088D Other fracture of T11-T12 vertebra, subsequent encounter for fracture with routine healing: Secondary | ICD-10-CM | POA: Diagnosis not present

## 2018-06-07 DIAGNOSIS — S32038D Other fracture of third lumbar vertebra, subsequent encounter for fracture with routine healing: Secondary | ICD-10-CM | POA: Diagnosis not present

## 2018-06-07 DIAGNOSIS — S32028D Other fracture of second lumbar vertebra, subsequent encounter for fracture with routine healing: Secondary | ICD-10-CM | POA: Diagnosis not present

## 2018-06-07 DIAGNOSIS — G894 Chronic pain syndrome: Secondary | ICD-10-CM | POA: Diagnosis not present

## 2018-06-07 DIAGNOSIS — W109XXD Fall (on) (from) unspecified stairs and steps, subsequent encounter: Secondary | ICD-10-CM | POA: Diagnosis not present

## 2018-06-07 DIAGNOSIS — S22040D Wedge compression fracture of fourth thoracic vertebra, subsequent encounter for fracture with routine healing: Secondary | ICD-10-CM | POA: Diagnosis not present

## 2018-06-07 DIAGNOSIS — M8580 Other specified disorders of bone density and structure, unspecified site: Secondary | ICD-10-CM | POA: Diagnosis not present

## 2018-06-07 DIAGNOSIS — S32018D Other fracture of first lumbar vertebra, subsequent encounter for fracture with routine healing: Secondary | ICD-10-CM | POA: Diagnosis not present

## 2018-06-07 DIAGNOSIS — M438X4 Other specified deforming dorsopathies, thoracic region: Secondary | ICD-10-CM | POA: Diagnosis not present

## 2018-06-07 DIAGNOSIS — M40204 Unspecified kyphosis, thoracic region: Secondary | ICD-10-CM | POA: Diagnosis not present

## 2018-06-11 DIAGNOSIS — W19XXXD Unspecified fall, subsequent encounter: Secondary | ICD-10-CM | POA: Diagnosis not present

## 2018-06-11 DIAGNOSIS — Z6841 Body Mass Index (BMI) 40.0 and over, adult: Secondary | ICD-10-CM | POA: Diagnosis not present

## 2018-06-11 DIAGNOSIS — M8588 Other specified disorders of bone density and structure, other site: Secondary | ICD-10-CM | POA: Diagnosis not present

## 2018-06-11 DIAGNOSIS — S22040D Wedge compression fracture of fourth thoracic vertebra, subsequent encounter for fracture with routine healing: Secondary | ICD-10-CM | POA: Diagnosis not present

## 2018-06-11 DIAGNOSIS — R69 Illness, unspecified: Secondary | ICD-10-CM | POA: Diagnosis not present

## 2018-06-11 DIAGNOSIS — Z716 Tobacco abuse counseling: Secondary | ICD-10-CM | POA: Diagnosis not present

## 2018-06-11 DIAGNOSIS — M5144 Schmorl's nodes, thoracic region: Secondary | ICD-10-CM | POA: Diagnosis not present

## 2018-06-12 DIAGNOSIS — G894 Chronic pain syndrome: Secondary | ICD-10-CM | POA: Diagnosis not present

## 2018-06-12 DIAGNOSIS — M25559 Pain in unspecified hip: Secondary | ICD-10-CM | POA: Diagnosis not present

## 2018-06-12 DIAGNOSIS — M546 Pain in thoracic spine: Secondary | ICD-10-CM | POA: Diagnosis not present

## 2018-06-12 DIAGNOSIS — M47817 Spondylosis without myelopathy or radiculopathy, lumbosacral region: Secondary | ICD-10-CM | POA: Diagnosis not present

## 2018-06-21 DIAGNOSIS — G894 Chronic pain syndrome: Secondary | ICD-10-CM | POA: Diagnosis not present

## 2018-06-21 DIAGNOSIS — M8580 Other specified disorders of bone density and structure, unspecified site: Secondary | ICD-10-CM | POA: Diagnosis not present

## 2018-06-21 DIAGNOSIS — M40204 Unspecified kyphosis, thoracic region: Secondary | ICD-10-CM | POA: Diagnosis not present

## 2018-06-21 DIAGNOSIS — S32038D Other fracture of third lumbar vertebra, subsequent encounter for fracture with routine healing: Secondary | ICD-10-CM | POA: Diagnosis not present

## 2018-06-21 DIAGNOSIS — M438X4 Other specified deforming dorsopathies, thoracic region: Secondary | ICD-10-CM | POA: Diagnosis not present

## 2018-06-21 DIAGNOSIS — W109XXD Fall (on) (from) unspecified stairs and steps, subsequent encounter: Secondary | ICD-10-CM | POA: Diagnosis not present

## 2018-06-21 DIAGNOSIS — S32018D Other fracture of first lumbar vertebra, subsequent encounter for fracture with routine healing: Secondary | ICD-10-CM | POA: Diagnosis not present

## 2018-06-21 DIAGNOSIS — S22040D Wedge compression fracture of fourth thoracic vertebra, subsequent encounter for fracture with routine healing: Secondary | ICD-10-CM | POA: Diagnosis not present

## 2018-06-21 DIAGNOSIS — S22088D Other fracture of T11-T12 vertebra, subsequent encounter for fracture with routine healing: Secondary | ICD-10-CM | POA: Diagnosis not present

## 2018-06-21 DIAGNOSIS — S32028D Other fracture of second lumbar vertebra, subsequent encounter for fracture with routine healing: Secondary | ICD-10-CM | POA: Diagnosis not present

## 2018-06-21 DIAGNOSIS — R69 Illness, unspecified: Secondary | ICD-10-CM | POA: Diagnosis not present

## 2018-07-10 DIAGNOSIS — M25559 Pain in unspecified hip: Secondary | ICD-10-CM | POA: Diagnosis not present

## 2018-07-10 DIAGNOSIS — Z79891 Long term (current) use of opiate analgesic: Secondary | ICD-10-CM | POA: Diagnosis not present

## 2018-07-10 DIAGNOSIS — Z79899 Other long term (current) drug therapy: Secondary | ICD-10-CM | POA: Diagnosis not present

## 2018-07-10 DIAGNOSIS — M546 Pain in thoracic spine: Secondary | ICD-10-CM | POA: Diagnosis not present

## 2018-07-10 DIAGNOSIS — G894 Chronic pain syndrome: Secondary | ICD-10-CM | POA: Diagnosis not present

## 2018-07-10 DIAGNOSIS — I87009 Postthrombotic syndrome without complications of unspecified extremity: Secondary | ICD-10-CM | POA: Diagnosis not present

## 2018-07-12 DIAGNOSIS — M8588 Other specified disorders of bone density and structure, other site: Secondary | ICD-10-CM | POA: Diagnosis not present

## 2018-07-12 DIAGNOSIS — M5144 Schmorl's nodes, thoracic region: Secondary | ICD-10-CM | POA: Diagnosis not present

## 2018-07-12 DIAGNOSIS — X58XXXD Exposure to other specified factors, subsequent encounter: Secondary | ICD-10-CM | POA: Diagnosis not present

## 2018-07-12 DIAGNOSIS — S22040D Wedge compression fracture of fourth thoracic vertebra, subsequent encounter for fracture with routine healing: Secondary | ICD-10-CM | POA: Diagnosis not present

## 2018-07-12 DIAGNOSIS — M47814 Spondylosis without myelopathy or radiculopathy, thoracic region: Secondary | ICD-10-CM | POA: Diagnosis not present

## 2018-08-07 DIAGNOSIS — G894 Chronic pain syndrome: Secondary | ICD-10-CM | POA: Diagnosis not present

## 2018-08-07 DIAGNOSIS — M546 Pain in thoracic spine: Secondary | ICD-10-CM | POA: Diagnosis not present

## 2018-08-09 DIAGNOSIS — M5134 Other intervertebral disc degeneration, thoracic region: Secondary | ICD-10-CM | POA: Diagnosis not present

## 2018-08-09 DIAGNOSIS — S22028D Other fracture of second thoracic vertebra, subsequent encounter for fracture with routine healing: Secondary | ICD-10-CM | POA: Diagnosis not present

## 2018-08-09 DIAGNOSIS — S22040D Wedge compression fracture of fourth thoracic vertebra, subsequent encounter for fracture with routine healing: Secondary | ICD-10-CM | POA: Diagnosis not present

## 2018-08-09 DIAGNOSIS — S22038D Other fracture of third thoracic vertebra, subsequent encounter for fracture with routine healing: Secondary | ICD-10-CM | POA: Diagnosis not present

## 2018-08-09 DIAGNOSIS — S22058D Other fracture of T5-T6 vertebra, subsequent encounter for fracture with routine healing: Secondary | ICD-10-CM | POA: Diagnosis not present

## 2018-08-09 DIAGNOSIS — S22089D Unspecified fracture of T11-T12 vertebra, subsequent encounter for fracture with routine healing: Secondary | ICD-10-CM | POA: Diagnosis not present

## 2018-08-09 DIAGNOSIS — M438X4 Other specified deforming dorsopathies, thoracic region: Secondary | ICD-10-CM | POA: Diagnosis not present

## 2018-08-09 DIAGNOSIS — S22048D Other fracture of fourth thoracic vertebra, subsequent encounter for fracture with routine healing: Secondary | ICD-10-CM | POA: Diagnosis not present

## 2018-08-09 DIAGNOSIS — W19XXXD Unspecified fall, subsequent encounter: Secondary | ICD-10-CM | POA: Diagnosis not present

## 2018-08-09 DIAGNOSIS — M8588 Other specified disorders of bone density and structure, other site: Secondary | ICD-10-CM | POA: Diagnosis not present

## 2018-08-12 DIAGNOSIS — I8310 Varicose veins of unspecified lower extremity with inflammation: Secondary | ICD-10-CM | POA: Diagnosis not present

## 2018-08-12 DIAGNOSIS — Z23 Encounter for immunization: Secondary | ICD-10-CM | POA: Diagnosis not present

## 2018-08-12 DIAGNOSIS — Z131 Encounter for screening for diabetes mellitus: Secondary | ICD-10-CM | POA: Diagnosis not present

## 2018-08-12 DIAGNOSIS — Z13220 Encounter for screening for lipoid disorders: Secondary | ICD-10-CM | POA: Diagnosis not present

## 2018-08-12 DIAGNOSIS — R69 Illness, unspecified: Secondary | ICD-10-CM | POA: Diagnosis not present

## 2018-08-12 DIAGNOSIS — B351 Tinea unguium: Secondary | ICD-10-CM | POA: Diagnosis not present

## 2018-08-27 DIAGNOSIS — B351 Tinea unguium: Secondary | ICD-10-CM | POA: Diagnosis not present

## 2018-09-04 DIAGNOSIS — Z79899 Other long term (current) drug therapy: Secondary | ICD-10-CM | POA: Diagnosis not present

## 2018-09-04 DIAGNOSIS — M25559 Pain in unspecified hip: Secondary | ICD-10-CM | POA: Diagnosis not present

## 2018-09-04 DIAGNOSIS — G894 Chronic pain syndrome: Secondary | ICD-10-CM | POA: Diagnosis not present

## 2018-09-04 DIAGNOSIS — M546 Pain in thoracic spine: Secondary | ICD-10-CM | POA: Diagnosis not present

## 2018-09-04 DIAGNOSIS — Z79891 Long term (current) use of opiate analgesic: Secondary | ICD-10-CM | POA: Diagnosis not present

## 2018-09-04 DIAGNOSIS — M47817 Spondylosis without myelopathy or radiculopathy, lumbosacral region: Secondary | ICD-10-CM | POA: Diagnosis not present

## 2018-10-04 DIAGNOSIS — M79606 Pain in leg, unspecified: Secondary | ICD-10-CM | POA: Diagnosis not present

## 2018-10-04 DIAGNOSIS — M25559 Pain in unspecified hip: Secondary | ICD-10-CM | POA: Diagnosis not present

## 2018-10-04 DIAGNOSIS — M546 Pain in thoracic spine: Secondary | ICD-10-CM | POA: Diagnosis not present

## 2018-10-04 DIAGNOSIS — G894 Chronic pain syndrome: Secondary | ICD-10-CM | POA: Diagnosis not present

## 2018-10-12 ENCOUNTER — Emergency Department (HOSPITAL_COMMUNITY)
Admission: EM | Admit: 2018-10-12 | Discharge: 2018-10-13 | Disposition: A | Discharge status: Home / Self Care | Payer: Medicare HMO | Attending: Emergency Medicine | Admitting: Emergency Medicine

## 2018-10-12 ENCOUNTER — Other Ambulatory Visit: Payer: Self-pay

## 2018-10-12 ENCOUNTER — Emergency Department (HOSPITAL_COMMUNITY): Payer: Medicare HMO

## 2018-10-12 ENCOUNTER — Encounter (HOSPITAL_COMMUNITY): Payer: Self-pay | Admitting: Emergency Medicine

## 2018-10-12 DIAGNOSIS — L97929 Non-pressure chronic ulcer of unspecified part of left lower leg with unspecified severity: Secondary | ICD-10-CM | POA: Insufficient documentation

## 2018-10-12 DIAGNOSIS — R000 Tachycardia, unspecified: Secondary | ICD-10-CM | POA: Insufficient documentation

## 2018-10-12 DIAGNOSIS — F17210 Nicotine dependence, cigarettes, uncomplicated: Secondary | ICD-10-CM | POA: Diagnosis not present

## 2018-10-12 DIAGNOSIS — Z86711 Personal history of pulmonary embolism: Secondary | ICD-10-CM | POA: Insufficient documentation

## 2018-10-12 DIAGNOSIS — L97829 Non-pressure chronic ulcer of other part of left lower leg with unspecified severity: Secondary | ICD-10-CM | POA: Diagnosis not present

## 2018-10-12 DIAGNOSIS — Z79899 Other long term (current) drug therapy: Secondary | ICD-10-CM | POA: Insufficient documentation

## 2018-10-12 DIAGNOSIS — L988 Other specified disorders of the skin and subcutaneous tissue: Secondary | ICD-10-CM | POA: Diagnosis present

## 2018-10-12 DIAGNOSIS — Z7901 Long term (current) use of anticoagulants: Secondary | ICD-10-CM | POA: Insufficient documentation

## 2018-10-12 DIAGNOSIS — R69 Illness, unspecified: Secondary | ICD-10-CM | POA: Diagnosis not present

## 2018-10-12 DIAGNOSIS — R600 Localized edema: Secondary | ICD-10-CM | POA: Diagnosis not present

## 2018-10-12 LAB — BASIC METABOLIC PANEL
ANION GAP: 9 (ref 5–15)
BUN: 13 mg/dL (ref 6–20)
CALCIUM: 8.5 mg/dL — AB (ref 8.9–10.3)
CO2: 27 mmol/L (ref 22–32)
Chloride: 104 mmol/L (ref 98–111)
Creatinine, Ser: 1.14 mg/dL (ref 0.61–1.24)
Glucose, Bld: 188 mg/dL — ABNORMAL HIGH (ref 70–99)
POTASSIUM: 4.2 mmol/L (ref 3.5–5.1)
SODIUM: 140 mmol/L (ref 135–145)

## 2018-10-12 LAB — CBC WITH DIFFERENTIAL/PLATELET
ABS IMMATURE GRANULOCYTES: 0.03 10*3/uL (ref 0.00–0.07)
BASOS ABS: 0 10*3/uL (ref 0.0–0.1)
Basophils Relative: 0 %
Eosinophils Absolute: 0.2 10*3/uL (ref 0.0–0.5)
Eosinophils Relative: 2 %
HCT: 44.7 % (ref 39.0–52.0)
Hemoglobin: 14.8 g/dL (ref 13.0–17.0)
IMMATURE GRANULOCYTES: 0 %
Lymphocytes Relative: 25 %
Lymphs Abs: 1.8 10*3/uL (ref 0.7–4.0)
MCH: 27.8 pg (ref 26.0–34.0)
MCHC: 33.1 g/dL (ref 30.0–36.0)
MCV: 83.9 fL (ref 80.0–100.0)
MONOS PCT: 6 %
Monocytes Absolute: 0.4 10*3/uL (ref 0.1–1.0)
NEUTROS ABS: 4.6 10*3/uL (ref 1.7–7.7)
NEUTROS PCT: 67 %
NRBC: 0 % (ref 0.0–0.2)
PLATELETS: 175 10*3/uL (ref 150–400)
RBC: 5.33 MIL/uL (ref 4.22–5.81)
RDW: 13 % (ref 11.5–15.5)
WBC: 7 10*3/uL (ref 4.0–10.5)

## 2018-10-12 LAB — I-STAT CG4 LACTIC ACID, ED
LACTIC ACID, VENOUS: 2.13 mmol/L — AB (ref 0.5–1.9)
LACTIC ACID, VENOUS: 0.93 mmol/L (ref 0.5–1.9)

## 2018-10-12 MED ORDER — SODIUM CHLORIDE 0.9 % IV SOLN
2.0000 g | INTRAVENOUS | Status: DC
  Administered 2018-10-12: 2 g via INTRAVENOUS
  Filled 2018-10-12: qty 20

## 2018-10-12 MED ORDER — MORPHINE SULFATE (PF) 4 MG/ML IV SOLN
4.0000 mg | Freq: Once | INTRAVENOUS | Status: AC
  Administered 2018-10-12: 4 mg via INTRAVENOUS
  Filled 2018-10-12: qty 1

## 2018-10-12 MED ORDER — HYDROMORPHONE HCL 1 MG/ML IJ SOLN
1.0000 mg | Freq: Once | INTRAMUSCULAR | Status: AC
  Administered 2018-10-12: 1 mg via INTRAVENOUS
  Filled 2018-10-12: qty 1

## 2018-10-12 MED ORDER — DOXYCYCLINE HYCLATE 100 MG PO CAPS: 100.0000 mg | ORAL_CAPSULE | Freq: Two times a day (BID) | ORAL | 0 refills | Status: AC

## 2018-10-12 MED ORDER — VANCOMYCIN HCL 10 G IV SOLR
2000.0000 mg | Freq: Once | INTRAVENOUS | Status: AC
  Administered 2018-10-12: 2000 mg via INTRAVENOUS
  Filled 2018-10-12: qty 2000

## 2018-10-12 MED ORDER — VANCOMYCIN HCL 10 G IV SOLR: 1250.0000 mg | Freq: Two times a day (BID) | INTRAVENOUS | Status: DC

## 2018-10-12 NOTE — ED Provider Notes (Signed)
[facility]-EMERGENCY DEPT Provider Note   CSN: [id number] Arrival date & time: 10/12/18  1859     History   Chief Complaint Chief Complaint  Patient presents with  . leg ulcer    LLE    HPI [name] is a 42 y.o. male.  HPI   Patient is a 42 year old male with a history of DVT, PE, who presents emergency department today for evaluation of multiple left lower extremity ulcers.  He states the ulcers have been present for several months he has been caring for him at home however for the last several days they have become more painful.  He has had some drainage from the wounds.  States that drainage is bloody and clear/white.  Denies any significant purulent drainage.  Has had no fevers at home or other systemic symptoms.  Has been managing his pain with methadone.  He denies a history of IV drug use.  States he used to follow with wound care but has not followed up with them in a very long time.  Past Medical History:  Diagnosis Date  . Arthritis   . DVT (deep venous thrombosis) (HCC)   . PE (pulmonary embolism)     Patient Active Problem List   Diagnosis Date Noted  . Abnormal dreams 07/10/2017  . Hypnagogic hallucinations 07/10/2017  . Sleep paralysis, recurrent isolated 07/10/2017  . Meralgia paresthetica of right side 07/10/2017  . Treatment-emergent central sleep apnea 02/26/2017  . Excessive daytime sleepiness 02/26/2017  . Other pulmonary embolism without acute cor pulmonale (HCC) 02/26/2017    Past Surgical History:  Procedure Laterality Date  . FRACTURE SURGERY     plate on R forearm with external fixation  . VENA CAVA FILTER PLACEMENT          Home Medications    Prior to Admission medications   Medication Sig Start Date End Date Taking? Authorizing Provider  buPROPion (WELLBUTRIN XL) 150 MG 24 hr tablet Take 150 mg by mouth daily. 03/26/16   [provider]  doxycycline (VIBRAMYCIN) 100 MG capsule Take 1 capsule (100 mg  total) by mouth 2 (two) times daily for 7 days. 10/12/18 10/19/18  Chadwick Reiswig S, PA-C  gabapentin (NEURONTIN) 800 MG tablet Take 800-1,600 mg by mouth 3 (three) times daily. Take 1600mg  in the morning, 800mg  in the afternoon, and 1600mg  at bedtime    [provider]  methadone (DOLOPHINE) 10 MG tablet Take 10 mg by mouth 3 (three) times daily.     [provider]  NUCYNTA 50 MG tablet  07/09/17   [provider]  rivaroxaban (XARELTO) 20 MG TABS tablet Take 20 mg by mouth at bedtime.     [provider]    Family History Family History  Problem Relation Age of Onset  . Diabetes Mother   . Diabetes Father   . Deep vein thrombosis Father   . Diabetes Sister   . Diabetes Brother   . Deep vein thrombosis Brother     Social History Social History   Tobacco Use  . Smoking status: Current Every Day Smoker    Packs/day: 1.00    Types: Cigarettes  . Smokeless tobacco: Never Used  Substance Use Topics  . Alcohol use: No  . Drug use: No     Allergies   Patient has no known allergies.   Review of Systems Review of Systems  Constitutional: Negative for fever.  HENT: Negative for congestion.   Eyes: Negative for visual disturbance.  Respiratory: Negative for shortness of breath.   Cardiovascular: Negative for chest pain.  Gastrointestinal: Negative for abdominal pain, constipation, diarrhea, nausea and vomiting.  Endocrine: Negative for polyuria.  Genitourinary: Negative for flank pain.  Musculoskeletal:       LLE pain  Skin: Positive for color change (chronic) and wound.  Neurological: Negative for headaches.     Physical Exam Updated Vital Signs BP 118/76   Pulse 75   Temp 99.2 F (37.3 C) (Rectal)   Resp 18   Ht 6\' 2"  (1.88 m)   Wt (!) 149.7 kg   SpO2 95%   BMI 42.37 kg/m   Physical Exam Vitals signs and nursing note reviewed.  Constitutional:      Appearance: He is well-developed. He is not ill-appearing or  toxic-appearing.  HENT:     Head: Normocephalic and atraumatic.  Eyes:     Conjunctiva/sclera: Conjunctivae normal.  Neck:     Musculoskeletal: Neck supple.  Cardiovascular:     Rate and Rhythm: Regular rhythm. Tachycardia present.     Heart sounds: Normal heart sounds. No murmur.  Pulmonary:     Effort: Pulmonary effort is normal. No respiratory distress.     Breath sounds: Normal breath sounds.  Abdominal:     Palpations: Abdomen is soft.     Tenderness: There is no abdominal tenderness.  Musculoskeletal:     Comments: Left lower extremity edema and skin is darkened consistent with chronic venous stasis changes.  2 large ulcers noted to the left lower extremity that are tender to palpation.  There is no palpable crepitance.  There is some mild warmth.  No obvious large areas of fluctuance.  DP pulses 2+ and symmetric.  Brisk cap refill distally.  No calf tenderness.  Skin:    General: Skin is warm and dry.  Neurological:     Mental Status: He is alert.            ED Treatments / Results  Labs (all labs ordered are listed, but only abnormal results are displayed) Labs Reviewed  BASIC METABOLIC PANEL - Abnormal; Notable for the following components:      Result Value   Glucose, Bld 188 (*)    Calcium 8.5 (*)    All other components within normal limits  I-STAT CG4 LACTIC ACID, ED - Abnormal; Notable for the following components:   Lactic Acid, Venous 2.13 (*)    All other components within normal limits  CULTURE, BLOOD (ROUTINE X 2)  CULTURE, BLOOD (ROUTINE X 2)  CBC WITH DIFFERENTIAL/PLATELET  I-STAT CG4 LACTIC ACID, ED  I-STAT CG4 LACTIC ACID, ED    EKG None  Radiology Dg Tibia/fibula Left  Result Date: 10/12/2018 CLINICAL DATA:  Draining skin ulcerations on the left lower leg. No known injury. EXAM: LEFT TIBIA AND FIBULA - 2 VIEW COMPARISON:  Plain films left lower leg 09/10/2014. FINDINGS: No bony abnormality is identified. No soft tissue gas or  radiopaque foreign body is seen. Subcutaneous tissues appear edematous. IMPRESSION: Lower extremity edema.  Left lower leg edema.  Otherwise negative. Electronically Signed   By: Drusilla Kannerhomas  Dalessio M.D.   On: 10/12/2018 20:31    Procedures Procedures (including critical care time)  Medications Ordered in ED Medications  cefTRIAXone (ROCEPHIN) 2 g in sodium chloride 0.9 % 100 mL IVPB (0 g Intravenous Stopped 10/12/18 2128)  vancomycin (VANCOCIN) 1,250 mg in sodium chloride 0.9 % 250 mL IVPB (has no administration in time range)  vancomycin (VANCOCIN) 2,000 mg in sodium  chloride 0.9 % 500 mL IVPB (0 mg Intravenous Stopped 10/12/18 2300)  morphine 4 MG/ML injection 4 mg (4 mg Intravenous Given 10/12/18 2058)  HYDROmorphone (DILAUDID) injection 1 mg (1 mg Intravenous Given 10/12/18 2346)     Initial Impression / Assessment and Plan / ED Course    I have reviewed the triage vital signs and the nursing notes.  Pertinent labs & imaging results that were available during my care of the patient were reviewed by me and considered in my medical decision making (see chart for details).  Pt seen in conjunction with Dr. Lynelle Doctor who evaluated pt and is in agreement with plan.  Final Clinical Impressions(s) / ED Diagnoses   Final diagnoses:  Ulcer of left lower extremity, unspecified ulcer stage (HCC)   Patient presenting with chronic left lower extremity wounds that have been present for several weeks but pain seem to worsen a few days ago.  No fevers at home.  No fevers in the ED.  He is initially tachycardic.  No hypotension noted.  No purulent drainage noted on exam but does have some warmth and chronic skin color changes to the left lower extremity.  Lab work shows no leukocytosis.  Remainder of labs are grossly reassuring.  He did initially have a slightly elevated lactic acid which normalized after administration of fluids and IV antibiotics.  X-ray of the left lower extremity was obtained and  show any x-ray evidence of osteomyelitis or soft tissue gas.  He also has no crepitus on exam therefore doubt necrotizing skin infection.  Will treat with antibiotics and have him follow-up with wound care.  He states he has an appointment with his wound care specialist on 12/23.  Advised him to call the office to schedule an earlier appointment if possible.  He agrees to do so and agrees to return if new or worsening symptoms develop.  All questions answered and patient stable for discharge.  ED Discharge Orders         Ordered    doxycycline (VIBRAMYCIN) 100 MG capsule  2 times daily     10/12/18 2350           Karrie Meres, PA-C 10/13/18 0004    Linwood Dibbles, MD 10/13/18 [phone]

## 2018-10-12 NOTE — Discharge Instructions (Addendum)
You were given a prescription for antibiotics. Please take the antibiotic prescription fully.   Please call your wound care doctor to make an appointment for follow up. You were also given information for the [facility]. You may call this office to make an appointment for follow up if you are unable to get an earlier appointment with your wound care specialist.   Please return to the emergency room immediately if you experience any new or worsening symptoms or any symptoms that indicate worsening infection such as fevers, increased redness/swelling/pain, warmth, or drainage from the affected area.

## 2018-10-12 NOTE — Progress Notes (Signed)
Pharmacy Antibiotic Note  [name] SellMatthew [name] is a 42 y.o. male admitted on 10/12/2018 with cellulitis.  Pharmacy has been consulted for vancomycin dosing.  Plan: Vancomycin 2000 mg loading dose then 1250 mg iv q12h. AUC 445, IBW/ABW.  F/U renal function, culture results  Height: 6\' 2"  (188 cm) Weight: (!) 330 lb (149.7 kg) IBW/kg (Calculated) : 82.2  Temp (24hrs), Avg:99 F (37.2 C), Min:98.8 F (37.1 C), Max:99.2 F (37.3 C)  Recent Labs  Lab 10/12/18 1955 10/12/18 2011  WBC 7.0  --   LATICACIDVEN  --  2.13*    CrCl cannot be calculated (Patient's most recent lab result is older than the maximum 21 days allowed.).    No Known Allergies  Antimicrobials this admission: 12/14 CTX >>  12/14 vancomycin >>   Dose adjustments this admission:   Microbiology results: 12/14 BCx:   Thank you for allowing pharmacy to be a part of this patient's care.  [name] HartChristy, [name] D 10/12/2018 8:26 PM

## 2018-10-12 NOTE — ED Triage Notes (Signed)
Pt c/o LLE ulcer, states ulcer has become extremely painful and drainage from site. Pt with hx of ulcers to lower extremity. Denies fevers. Pt has been caring for wound at home and cleaning and dressing wound by self.

## 2018-10-13 DIAGNOSIS — L97929 Non-pressure chronic ulcer of unspecified part of left lower leg with unspecified severity: Secondary | ICD-10-CM | POA: Diagnosis not present

## 2018-10-13 DIAGNOSIS — R69 Illness, unspecified: Secondary | ICD-10-CM | POA: Diagnosis not present

## 2018-10-13 DIAGNOSIS — Z86711 Personal history of pulmonary embolism: Secondary | ICD-10-CM | POA: Diagnosis not present

## 2018-10-13 DIAGNOSIS — Z79899 Other long term (current) drug therapy: Secondary | ICD-10-CM | POA: Diagnosis not present

## 2018-10-13 DIAGNOSIS — R000 Tachycardia, unspecified: Secondary | ICD-10-CM | POA: Diagnosis not present

## 2018-10-13 DIAGNOSIS — Z7901 Long term (current) use of anticoagulants: Secondary | ICD-10-CM | POA: Diagnosis not present

## 2018-10-13 MED ORDER — BACITRACIN ZINC 500 UNIT/GM EX OINT
TOPICAL_OINTMENT | CUTANEOUS | Status: AC
  Administered 2018-10-13: 1
  Filled 2018-10-13: qty 2.7

## 2018-10-13 MED ORDER — HYDROGEN PEROXIDE 3 % EX SOLN
CUTANEOUS | Status: AC
  Administered 2018-10-13
  Filled 2018-10-13: qty 473

## 2018-10-17 LAB — CULTURE, BLOOD (ROUTINE X 2)
Culture: NO GROWTH
Culture: NO GROWTH
Special Requests: ADEQUATE

## 2018-10-21 ENCOUNTER — Encounter (HOSPITAL_BASED_OUTPATIENT_CLINIC_OR_DEPARTMENT_OTHER): Payer: Medicare HMO | Attending: Internal Medicine

## 2018-10-21 DIAGNOSIS — Z86711 Personal history of pulmonary embolism: Secondary | ICD-10-CM | POA: Insufficient documentation

## 2018-10-21 DIAGNOSIS — L97822 Non-pressure chronic ulcer of other part of left lower leg with fat layer exposed: Secondary | ICD-10-CM | POA: Diagnosis not present

## 2018-10-21 DIAGNOSIS — F17210 Nicotine dependence, cigarettes, uncomplicated: Secondary | ICD-10-CM | POA: Diagnosis not present

## 2018-10-21 DIAGNOSIS — Z86718 Personal history of other venous thrombosis and embolism: Secondary | ICD-10-CM | POA: Diagnosis not present

## 2018-10-21 DIAGNOSIS — I87332 Chronic venous hypertension (idiopathic) with ulcer and inflammation of left lower extremity: Secondary | ICD-10-CM | POA: Diagnosis not present

## 2018-10-21 DIAGNOSIS — L97222 Non-pressure chronic ulcer of left calf with fat layer exposed: Secondary | ICD-10-CM | POA: Diagnosis not present

## 2018-10-21 DIAGNOSIS — R69 Illness, unspecified: Secondary | ICD-10-CM | POA: Diagnosis not present

## 2018-10-21 DIAGNOSIS — L97221 Non-pressure chronic ulcer of left calf limited to breakdown of skin: Secondary | ICD-10-CM | POA: Diagnosis not present

## 2018-10-28 DIAGNOSIS — L97229 Non-pressure chronic ulcer of left calf with unspecified severity: Secondary | ICD-10-CM | POA: Diagnosis not present

## 2018-10-28 DIAGNOSIS — L97221 Non-pressure chronic ulcer of left calf limited to breakdown of skin: Secondary | ICD-10-CM | POA: Diagnosis not present

## 2018-10-28 DIAGNOSIS — L97829 Non-pressure chronic ulcer of other part of left lower leg with unspecified severity: Secondary | ICD-10-CM | POA: Diagnosis not present

## 2018-10-28 DIAGNOSIS — Z86718 Personal history of other venous thrombosis and embolism: Secondary | ICD-10-CM | POA: Diagnosis not present

## 2018-10-28 DIAGNOSIS — I87332 Chronic venous hypertension (idiopathic) with ulcer and inflammation of left lower extremity: Secondary | ICD-10-CM | POA: Diagnosis not present

## 2018-10-28 DIAGNOSIS — R69 Illness, unspecified: Secondary | ICD-10-CM | POA: Diagnosis not present

## 2018-10-28 DIAGNOSIS — Z86711 Personal history of pulmonary embolism: Secondary | ICD-10-CM | POA: Diagnosis not present

## 2018-11-05 ENCOUNTER — Encounter (HOSPITAL_BASED_OUTPATIENT_CLINIC_OR_DEPARTMENT_OTHER): Payer: Self-pay

## 2018-11-05 ENCOUNTER — Encounter (HOSPITAL_BASED_OUTPATIENT_CLINIC_OR_DEPARTMENT_OTHER): Payer: Medicare HMO | Attending: Internal Medicine

## 2018-11-05 DIAGNOSIS — L97822 Non-pressure chronic ulcer of other part of left lower leg with fat layer exposed: Secondary | ICD-10-CM | POA: Diagnosis not present

## 2018-11-05 DIAGNOSIS — L97222 Non-pressure chronic ulcer of left calf with fat layer exposed: Secondary | ICD-10-CM | POA: Diagnosis not present

## 2018-11-05 DIAGNOSIS — I87332 Chronic venous hypertension (idiopathic) with ulcer and inflammation of left lower extremity: Secondary | ICD-10-CM | POA: Diagnosis not present

## 2018-11-05 DIAGNOSIS — Z86718 Personal history of other venous thrombosis and embolism: Secondary | ICD-10-CM | POA: Insufficient documentation

## 2018-11-05 DIAGNOSIS — G4730 Sleep apnea, unspecified: Secondary | ICD-10-CM | POA: Diagnosis not present

## 2018-11-12 DIAGNOSIS — I87332 Chronic venous hypertension (idiopathic) with ulcer and inflammation of left lower extremity: Secondary | ICD-10-CM | POA: Diagnosis not present

## 2018-11-26 ENCOUNTER — Ambulatory Visit
Admission: RE | Admit: 2018-11-26 | Discharge: 2018-11-26 | Disposition: A | Discharge status: Home / Self Care | Payer: Medicare HMO | Source: Ambulatory Visit | Attending: Physician Assistant | Admitting: Physician Assistant

## 2018-11-26 ENCOUNTER — Other Ambulatory Visit: Payer: Self-pay | Admitting: Physician Assistant

## 2018-11-26 DIAGNOSIS — I87332 Chronic venous hypertension (idiopathic) with ulcer and inflammation of left lower extremity: Secondary | ICD-10-CM | POA: Diagnosis not present

## 2018-11-26 DIAGNOSIS — M25551 Pain in right hip: Secondary | ICD-10-CM

## 2018-12-10 ENCOUNTER — Encounter (HOSPITAL_BASED_OUTPATIENT_CLINIC_OR_DEPARTMENT_OTHER): Payer: Medicare HMO | Attending: Internal Medicine

## 2018-12-10 DIAGNOSIS — Z86718 Personal history of other venous thrombosis and embolism: Secondary | ICD-10-CM | POA: Diagnosis not present

## 2018-12-10 DIAGNOSIS — I87332 Chronic venous hypertension (idiopathic) with ulcer and inflammation of left lower extremity: Secondary | ICD-10-CM | POA: Insufficient documentation

## 2018-12-10 DIAGNOSIS — G4730 Sleep apnea, unspecified: Secondary | ICD-10-CM | POA: Diagnosis not present

## 2018-12-10 DIAGNOSIS — L97822 Non-pressure chronic ulcer of other part of left lower leg with fat layer exposed: Secondary | ICD-10-CM | POA: Diagnosis not present

## 2018-12-12 ENCOUNTER — Ambulatory Visit (INDEPENDENT_AMBULATORY_CARE_PROVIDER_SITE_OTHER): Payer: Medicare HMO | Admitting: Orthopaedic Surgery

## 2018-12-17 DIAGNOSIS — I87332 Chronic venous hypertension (idiopathic) with ulcer and inflammation of left lower extremity: Secondary | ICD-10-CM | POA: Diagnosis not present

## 2018-12-24 DIAGNOSIS — I87332 Chronic venous hypertension (idiopathic) with ulcer and inflammation of left lower extremity: Secondary | ICD-10-CM | POA: Diagnosis not present

## 2018-12-31 ENCOUNTER — Encounter (HOSPITAL_BASED_OUTPATIENT_CLINIC_OR_DEPARTMENT_OTHER): Payer: Medicare HMO | Attending: Internal Medicine

## 2018-12-31 DIAGNOSIS — L97821 Non-pressure chronic ulcer of other part of left lower leg limited to breakdown of skin: Secondary | ICD-10-CM | POA: Insufficient documentation

## 2018-12-31 DIAGNOSIS — G629 Polyneuropathy, unspecified: Secondary | ICD-10-CM | POA: Diagnosis not present

## 2018-12-31 DIAGNOSIS — I87332 Chronic venous hypertension (idiopathic) with ulcer and inflammation of left lower extremity: Secondary | ICD-10-CM | POA: Insufficient documentation

## 2018-12-31 DIAGNOSIS — Z86718 Personal history of other venous thrombosis and embolism: Secondary | ICD-10-CM | POA: Diagnosis not present

## 2018-12-31 DIAGNOSIS — G4730 Sleep apnea, unspecified: Secondary | ICD-10-CM | POA: Insufficient documentation

## 2019-02-03 ENCOUNTER — Encounter (HOSPITAL_BASED_OUTPATIENT_CLINIC_OR_DEPARTMENT_OTHER): Payer: Medicare HMO | Attending: Internal Medicine

## 2019-02-03 DIAGNOSIS — L97521 Non-pressure chronic ulcer of other part of left foot limited to breakdown of skin: Secondary | ICD-10-CM | POA: Insufficient documentation

## 2019-02-03 DIAGNOSIS — G4730 Sleep apnea, unspecified: Secondary | ICD-10-CM | POA: Insufficient documentation

## 2019-02-03 DIAGNOSIS — L97221 Non-pressure chronic ulcer of left calf limited to breakdown of skin: Secondary | ICD-10-CM | POA: Diagnosis not present

## 2019-02-03 DIAGNOSIS — Z7901 Long term (current) use of anticoagulants: Secondary | ICD-10-CM | POA: Diagnosis not present

## 2019-02-03 DIAGNOSIS — F17210 Nicotine dependence, cigarettes, uncomplicated: Secondary | ICD-10-CM | POA: Diagnosis not present

## 2019-02-03 DIAGNOSIS — I87332 Chronic venous hypertension (idiopathic) with ulcer and inflammation of left lower extremity: Secondary | ICD-10-CM | POA: Diagnosis not present

## 2019-02-03 DIAGNOSIS — L03116 Cellulitis of left lower limb: Secondary | ICD-10-CM | POA: Insufficient documentation

## 2019-02-03 DIAGNOSIS — Z86718 Personal history of other venous thrombosis and embolism: Secondary | ICD-10-CM | POA: Insufficient documentation

## 2019-02-03 DIAGNOSIS — G629 Polyneuropathy, unspecified: Secondary | ICD-10-CM | POA: Insufficient documentation

## 2019-02-10 DIAGNOSIS — I87332 Chronic venous hypertension (idiopathic) with ulcer and inflammation of left lower extremity: Secondary | ICD-10-CM | POA: Diagnosis not present

## 2019-02-17 DIAGNOSIS — I87332 Chronic venous hypertension (idiopathic) with ulcer and inflammation of left lower extremity: Secondary | ICD-10-CM | POA: Diagnosis not present

## 2019-02-24 DIAGNOSIS — I87332 Chronic venous hypertension (idiopathic) with ulcer and inflammation of left lower extremity: Secondary | ICD-10-CM | POA: Diagnosis not present

## 2019-02-25 ENCOUNTER — Other Ambulatory Visit: Payer: Self-pay

## 2019-03-03 ENCOUNTER — Encounter (HOSPITAL_BASED_OUTPATIENT_CLINIC_OR_DEPARTMENT_OTHER): Payer: Medicare HMO | Attending: Internal Medicine

## 2019-03-03 ENCOUNTER — Other Ambulatory Visit (HOSPITAL_COMMUNITY)
Admission: RE | Admit: 2019-03-03 | Discharge: 2019-03-03 | Disposition: A | Discharge status: Home / Self Care | Payer: Medicare HMO | Source: Other Acute Inpatient Hospital | Attending: Internal Medicine | Admitting: Internal Medicine

## 2019-03-03 DIAGNOSIS — G4730 Sleep apnea, unspecified: Secondary | ICD-10-CM | POA: Diagnosis not present

## 2019-03-03 DIAGNOSIS — B9561 Methicillin susceptible Staphylococcus aureus infection as the cause of diseases classified elsewhere: Secondary | ICD-10-CM | POA: Insufficient documentation

## 2019-03-03 DIAGNOSIS — L97521 Non-pressure chronic ulcer of other part of left foot limited to breakdown of skin: Secondary | ICD-10-CM | POA: Diagnosis present

## 2019-03-03 DIAGNOSIS — L97822 Non-pressure chronic ulcer of other part of left lower leg with fat layer exposed: Secondary | ICD-10-CM | POA: Diagnosis present

## 2019-03-03 DIAGNOSIS — Z86718 Personal history of other venous thrombosis and embolism: Secondary | ICD-10-CM | POA: Diagnosis not present

## 2019-03-03 DIAGNOSIS — L97522 Non-pressure chronic ulcer of other part of left foot with fat layer exposed: Secondary | ICD-10-CM | POA: Insufficient documentation

## 2019-03-03 DIAGNOSIS — I87332 Chronic venous hypertension (idiopathic) with ulcer and inflammation of left lower extremity: Secondary | ICD-10-CM | POA: Insufficient documentation

## 2019-03-06 LAB — AEROBIC CULTURE W GRAM STAIN (SUPERFICIAL SPECIMEN)

## 2019-03-10 DIAGNOSIS — I87332 Chronic venous hypertension (idiopathic) with ulcer and inflammation of left lower extremity: Secondary | ICD-10-CM | POA: Diagnosis not present

## 2019-03-13 IMAGING — CR DG HIP (WITH OR WITHOUT PELVIS) 2-3V*R*
2 series · 2 of 2 positions shown · non-contrast
Comparison: 03/30/2017

CLINICAL DATA: Chronic right hip pain, remote history of trauma

EXAM:
DG HIP (WITH OR WITHOUT PELVIS) 2-3V RIGHT

[w hip ap right]
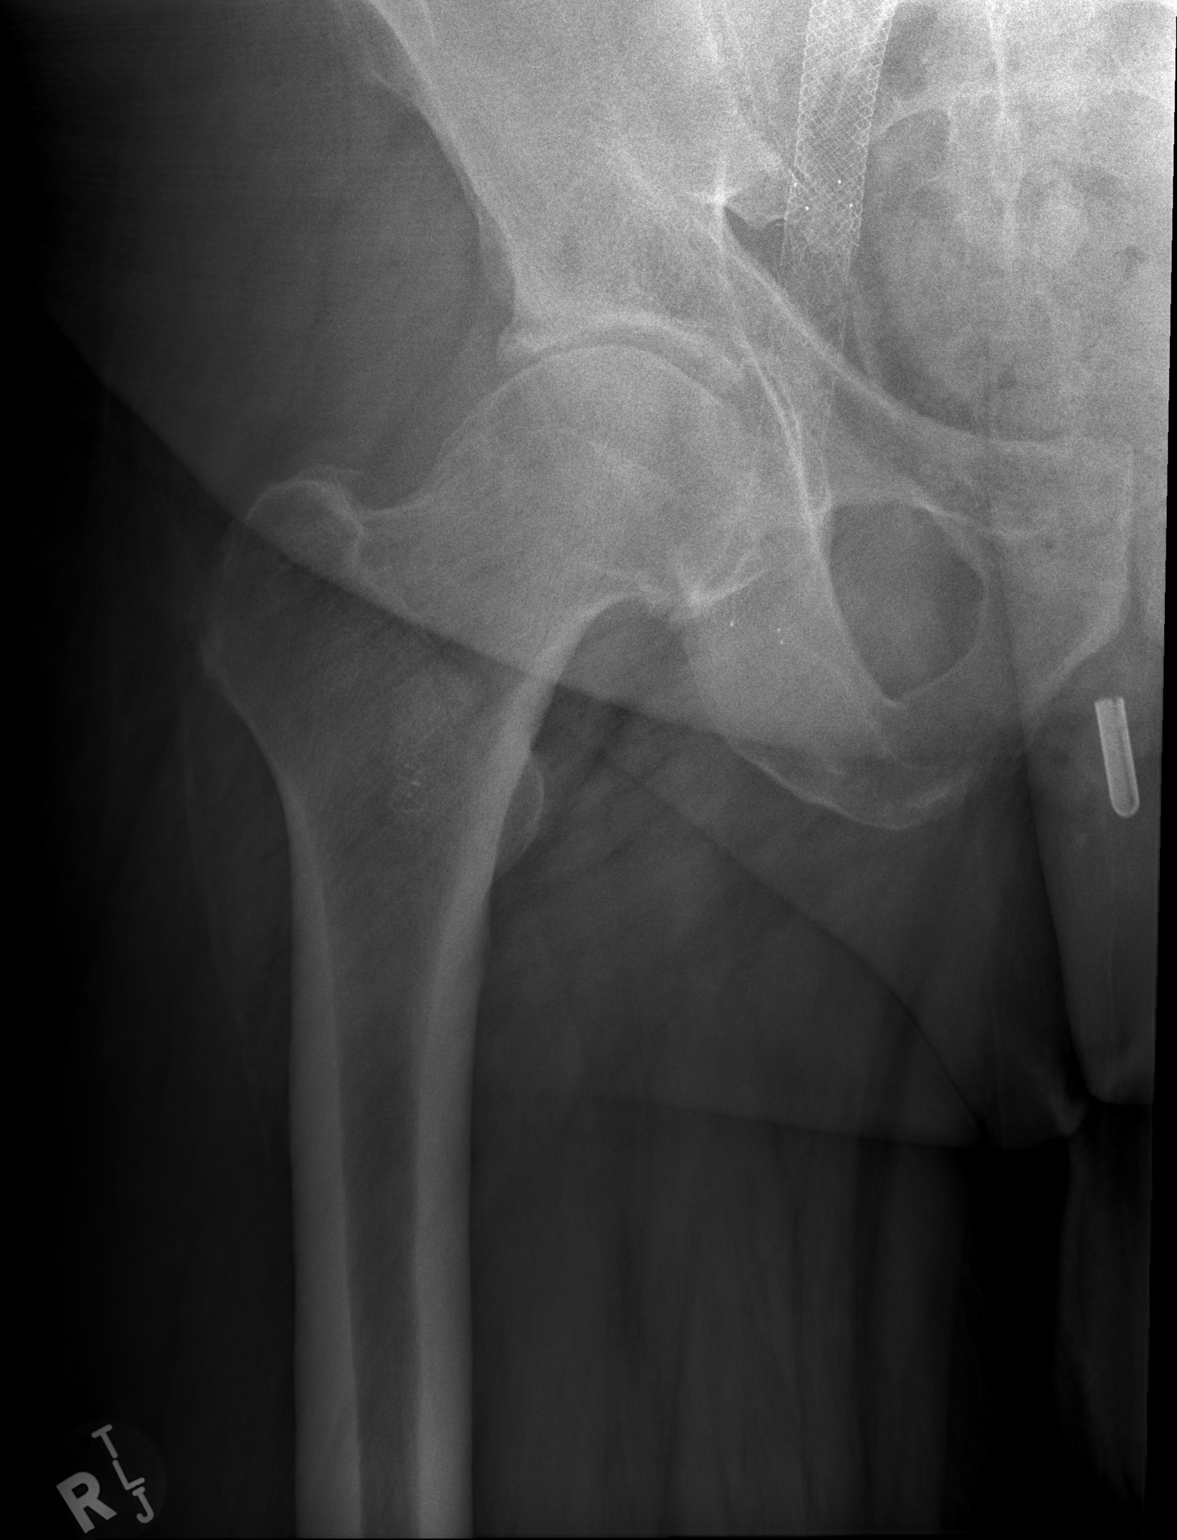

[w hip frog right]
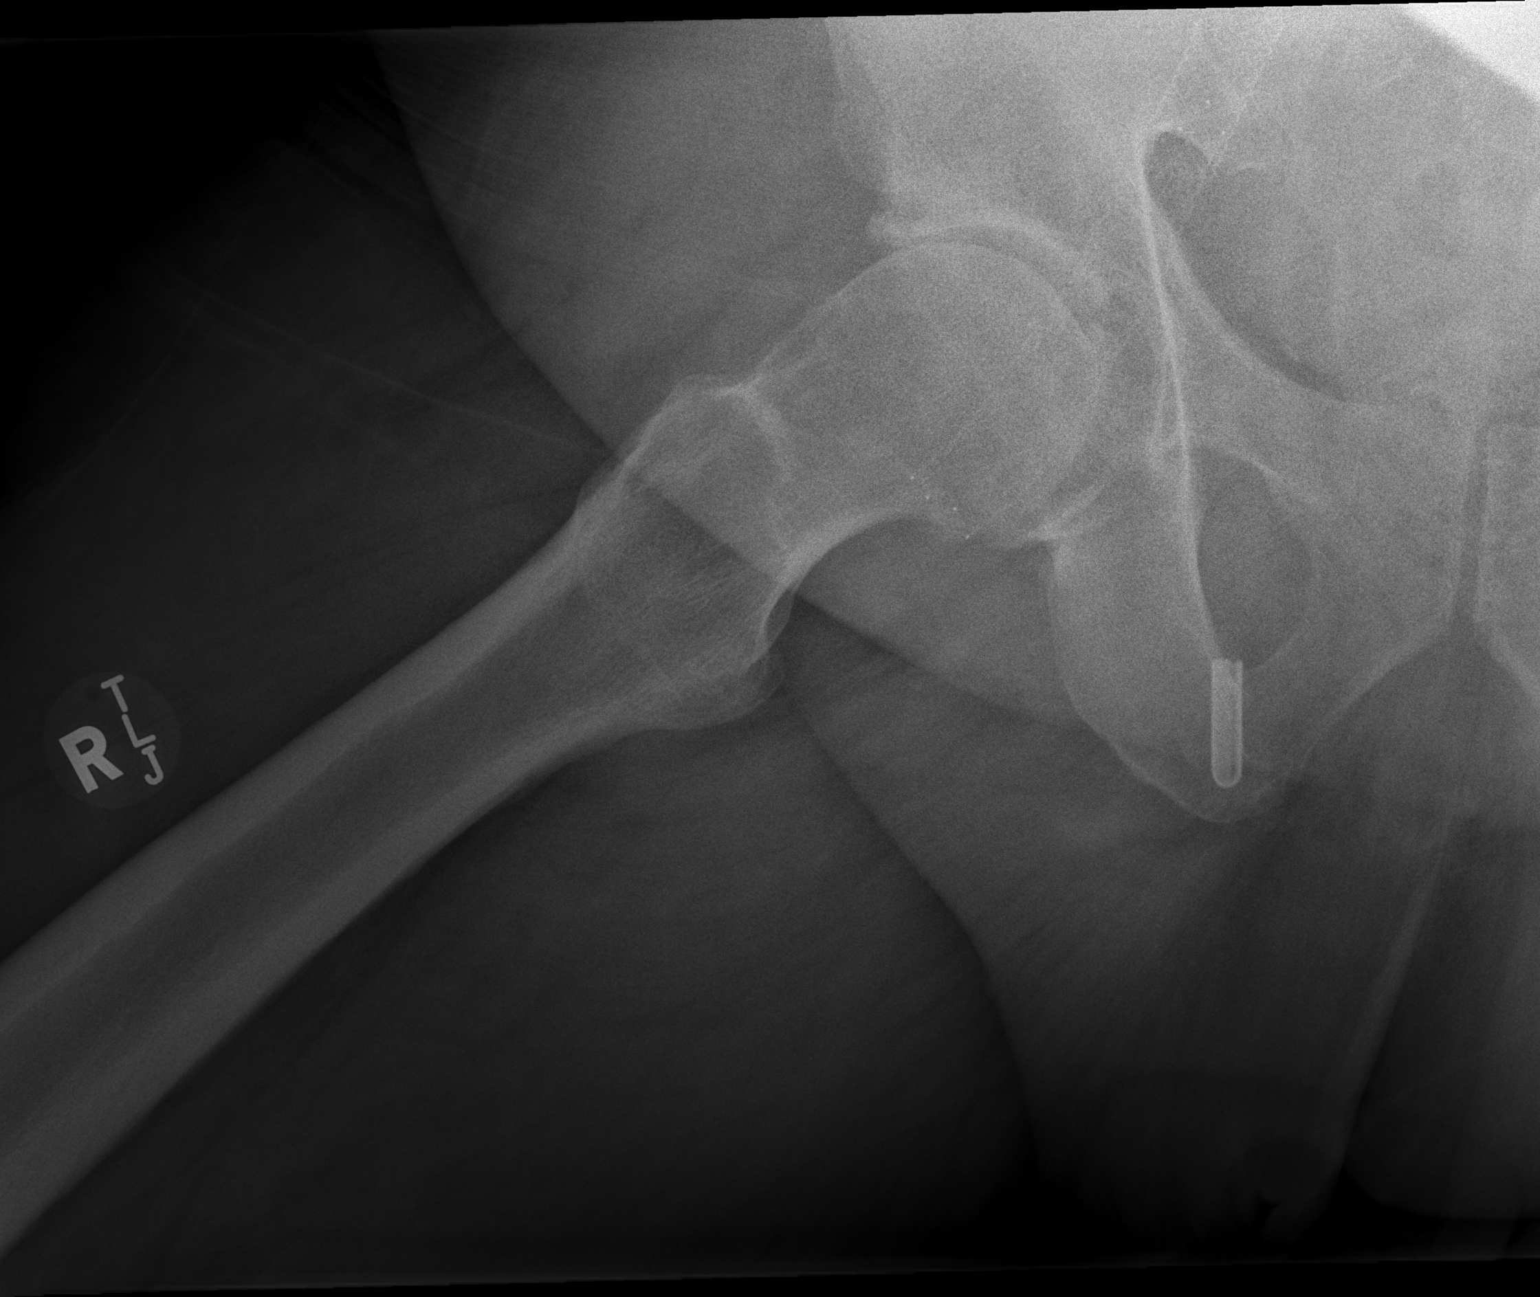

[2 of 2 positions shown; findings below may reference images not displayed]

FINDINGS: There is no evidence of hip fracture or dislocation. Mild superior
joint space height loss and osteophytosis of the right hip joint,
slightly progressed compared to prior examination dated 3405. There
may be asphericity of the right femoral head neck junction. There is
a partially imaged stent or bypass graft over the right iliac
vessels.
IMPRESSION: Mild superior joint space height loss and osteophytosis of the right
hip joint, slightly progressed compared to prior examination dated
3405. There may be asphericity of the right femoral head neck
junction.

## 2019-03-17 DIAGNOSIS — I87332 Chronic venous hypertension (idiopathic) with ulcer and inflammation of left lower extremity: Secondary | ICD-10-CM | POA: Diagnosis not present

## 2019-04-21 ENCOUNTER — Encounter: Payer: Self-pay | Admitting: Physician Assistant

## 2019-04-21 ENCOUNTER — Ambulatory Visit: Payer: Medicare HMO | Admitting: Physician Assistant

## 2019-04-21 ENCOUNTER — Other Ambulatory Visit: Payer: Self-pay

## 2019-04-21 DIAGNOSIS — F4310 Post-traumatic stress disorder, unspecified: Secondary | ICD-10-CM | POA: Diagnosis not present

## 2019-04-21 DIAGNOSIS — F331 Major depressive disorder, recurrent, moderate: Secondary | ICD-10-CM

## 2019-04-21 MED ORDER — SERTRALINE HCL 50 MG PO TABS: ORAL_TABLET | ORAL | 0 refills | Status: DC
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
## 2019-04-24 ENCOUNTER — Encounter (HOSPITAL_BASED_OUTPATIENT_CLINIC_OR_DEPARTMENT_OTHER): Payer: Medicare HMO | Attending: Internal Medicine

## 2019-04-24 DIAGNOSIS — F17210 Nicotine dependence, cigarettes, uncomplicated: Secondary | ICD-10-CM | POA: Insufficient documentation

## 2019-04-24 DIAGNOSIS — I890 Lymphedema, not elsewhere classified: Secondary | ICD-10-CM | POA: Insufficient documentation

## 2019-04-24 DIAGNOSIS — Z86711 Personal history of pulmonary embolism: Secondary | ICD-10-CM | POA: Diagnosis not present

## 2019-04-24 DIAGNOSIS — Z86718 Personal history of other venous thrombosis and embolism: Secondary | ICD-10-CM | POA: Insufficient documentation

## 2019-04-24 DIAGNOSIS — L97822 Non-pressure chronic ulcer of other part of left lower leg with fat layer exposed: Secondary | ICD-10-CM | POA: Diagnosis not present

## 2019-04-24 DIAGNOSIS — L97322 Non-pressure chronic ulcer of left ankle with fat layer exposed: Secondary | ICD-10-CM | POA: Diagnosis not present

## 2019-04-24 DIAGNOSIS — G4730 Sleep apnea, unspecified: Secondary | ICD-10-CM | POA: Insufficient documentation

## 2019-04-24 DIAGNOSIS — I87332 Chronic venous hypertension (idiopathic) with ulcer and inflammation of left lower extremity: Secondary | ICD-10-CM | POA: Insufficient documentation

## 2019-04-24 DIAGNOSIS — I87322 Chronic venous hypertension (idiopathic) with inflammation of left lower extremity: Secondary | ICD-10-CM | POA: Diagnosis present

## 2019-05-01 ENCOUNTER — Encounter (HOSPITAL_BASED_OUTPATIENT_CLINIC_OR_DEPARTMENT_OTHER): Payer: Medicare HMO | Attending: Internal Medicine

## 2019-05-01 DIAGNOSIS — L97822 Non-pressure chronic ulcer of other part of left lower leg with fat layer exposed: Secondary | ICD-10-CM | POA: Insufficient documentation

## 2019-05-01 DIAGNOSIS — Z7901 Long term (current) use of anticoagulants: Secondary | ICD-10-CM | POA: Diagnosis not present

## 2019-05-01 DIAGNOSIS — I890 Lymphedema, not elsewhere classified: Secondary | ICD-10-CM | POA: Insufficient documentation

## 2019-05-01 DIAGNOSIS — Z86718 Personal history of other venous thrombosis and embolism: Secondary | ICD-10-CM | POA: Diagnosis not present

## 2019-05-01 DIAGNOSIS — G629 Polyneuropathy, unspecified: Secondary | ICD-10-CM | POA: Insufficient documentation

## 2019-05-01 DIAGNOSIS — I87331 Chronic venous hypertension (idiopathic) with ulcer and inflammation of right lower extremity: Secondary | ICD-10-CM | POA: Diagnosis not present

## 2019-05-01 DIAGNOSIS — G4730 Sleep apnea, unspecified: Secondary | ICD-10-CM | POA: Diagnosis not present

## 2019-05-08 DIAGNOSIS — I87331 Chronic venous hypertension (idiopathic) with ulcer and inflammation of right lower extremity: Secondary | ICD-10-CM | POA: Diagnosis not present

## 2019-05-15 DIAGNOSIS — I87331 Chronic venous hypertension (idiopathic) with ulcer and inflammation of right lower extremity: Secondary | ICD-10-CM | POA: Diagnosis not present

## 2019-05-20 ENCOUNTER — Other Ambulatory Visit: Payer: Self-pay

## 2019-05-20 ENCOUNTER — Ambulatory Visit: Payer: Medicare HMO | Admitting: Physician Assistant

## 2019-05-22 DIAGNOSIS — I87331 Chronic venous hypertension (idiopathic) with ulcer and inflammation of right lower extremity: Secondary | ICD-10-CM | POA: Diagnosis not present

## 2019-05-28 ENCOUNTER — Telehealth: Payer: Self-pay | Admitting: Physician Assistant

## 2019-05-28 ENCOUNTER — Other Ambulatory Visit: Payer: Self-pay

## 2019-05-28 ENCOUNTER — Ambulatory Visit: Payer: Medicare HMO | Admitting: Physician Assistant

## 2019-05-28 MED ORDER — SERTRALINE HCL 50 MG PO TABS: ORAL_TABLET | ORAL | 1 refills | Status: DC

## 2019-05-28 NOTE — Telephone Encounter (Signed)
Patient need refill on Sertraline to be sent to [facility], had to rs provider out

## 2019-05-28 NOTE — Telephone Encounter (Signed)
Refills submitted until follow up appt

## 2019-05-29 DIAGNOSIS — I87331 Chronic venous hypertension (idiopathic) with ulcer and inflammation of right lower extremity: Secondary | ICD-10-CM | POA: Diagnosis not present

## 2019-06-05 ENCOUNTER — Other Ambulatory Visit: Payer: Self-pay

## 2019-06-05 ENCOUNTER — Encounter (HOSPITAL_BASED_OUTPATIENT_CLINIC_OR_DEPARTMENT_OTHER): Payer: Medicare HMO | Attending: Internal Medicine

## 2019-06-05 DIAGNOSIS — L97822 Non-pressure chronic ulcer of other part of left lower leg with fat layer exposed: Secondary | ICD-10-CM | POA: Insufficient documentation

## 2019-06-05 DIAGNOSIS — I890 Lymphedema, not elsewhere classified: Secondary | ICD-10-CM | POA: Insufficient documentation

## 2019-06-05 DIAGNOSIS — I87332 Chronic venous hypertension (idiopathic) with ulcer and inflammation of left lower extremity: Secondary | ICD-10-CM | POA: Insufficient documentation

## 2019-06-12 DIAGNOSIS — I87332 Chronic venous hypertension (idiopathic) with ulcer and inflammation of left lower extremity: Secondary | ICD-10-CM | POA: Diagnosis not present

## 2019-06-19 DIAGNOSIS — I87332 Chronic venous hypertension (idiopathic) with ulcer and inflammation of left lower extremity: Secondary | ICD-10-CM | POA: Diagnosis not present

## 2019-06-26 DIAGNOSIS — I87332 Chronic venous hypertension (idiopathic) with ulcer and inflammation of left lower extremity: Secondary | ICD-10-CM | POA: Diagnosis not present

## 2019-07-02 ENCOUNTER — Other Ambulatory Visit: Payer: Self-pay

## 2019-07-02 ENCOUNTER — Ambulatory Visit (INDEPENDENT_AMBULATORY_CARE_PROVIDER_SITE_OTHER): Payer: Medicare HMO | Admitting: Physician Assistant

## 2019-07-02 ENCOUNTER — Encounter: Payer: Self-pay | Admitting: Physician Assistant

## 2019-07-02 DIAGNOSIS — F331 Major depressive disorder, recurrent, moderate: Secondary | ICD-10-CM | POA: Diagnosis not present

## 2019-07-02 DIAGNOSIS — F4310 Post-traumatic stress disorder, unspecified: Secondary | ICD-10-CM | POA: Diagnosis not present

## 2019-07-02 MED ORDER — BUPROPION HCL ER (XL) 300 MG PO TB24: 300.0000 mg | ORAL_TABLET | Freq: Every day | ORAL | 1 refills | Status: DC

## 2019-07-02 NOTE — Progress Notes (Signed)
[facility]  Patient ID: [name] SellMatthew [name],  MRN: [id number]  PCP: [name], [name], MD  Date of Evaluation: 07/02/2019 Time spent:15 minutes  Chief Complaint:  Chief Complaint    Follow-up     Virtual Visit via Telephone Note  I connected with patient by a video enabled telemedicine application or telephone, with their informed consent, and verified patient privacy and that I am speaking with the correct person using two identifiers.  I am private, in my home and the patient is in his car.   I discussed the limitations, risks, security and privacy concerns of performing an evaluation and management service by telephone and the availability of in person appointments. I also discussed with the patient that there may be a patient responsible charge related to this service. The patient expressed understanding and agreed to proceed.   I discussed the assessment and treatment plan with the patient. The patient was provided an opportunity to ask questions and all were answered. The patient agreed with the plan and demonstrated an understanding of the instructions.   The patient was advised to call back or seek an in-person evaluation if the symptoms worsen or if the condition fails to improve as anticipated.  I provided 15 minutes of non-face-to-face time during this encounter.  HISTORY/CURRENT STATUS: HPI For routine med check.  We added Zoloft at LOV 6 wks ago, and he hasn't really noticed any improvement.  For couple of weeks after he started it, he felt like a zombie.  That subsided and then the dose was increased and he felt that way again.  He felt better for a week or 2 emotionally and then he feels the same way now as he did prior to starting the Zoloft.  He is also had sexual dysfunction since starting the Zoloft.  He does not feel good about the medicine.  He still has trouble enjoying things.  Complains of low energy and motivation.  All he wants to do is sit or lay on the  couch all day.  He is disabled.  He does report isolating more even on top of the mandatory isolation from coronavirus pandemic.  He would rather stay home then go out and do anything with his wife.  He denies suicidal or homicidal thoughts.  He sleeps okay.  Denies dizziness, syncope, seizures, numbness, tingling, tremor, tics, unsteady gait, slurred speech, confusion.  He does have chronic pain.  Individual Medical History/ Review of Systems: Changes? :No    Past medications for mental health diagnoses include: Cymbalta, Zoloft, Wellbutrin  Allergies: Patient has no known allergies.  Current Medications:  Current Outpatient Medications:  .  gabapentin (NEURONTIN) 800 MG tablet, Take 800 mg by mouth 4 (four) times daily. Take 1600mg  in the morning, 800mg  in the afternoon, and 1600mg  at bedtime , Disp: , Rfl:  .  methadone (DOLOPHINE) 10 MG tablet, Take 10 mg by mouth 3 (three) times daily. , Disp: , Rfl:  .  NUCYNTA 50 MG tablet, , Disp: , Rfl:  .  rivaroxaban (XARELTO) 20 MG TABS tablet, Take 20 mg by mouth at bedtime. , Disp: , Rfl:  .  buPROPion (WELLBUTRIN XL) 300 MG 24 hr tablet, Take 1 tablet (300 mg total) by mouth daily., Disp: 30 tablet, Rfl: 1 Medication Side Effects: none  Family Medical/ Social History: Changes? No  MENTAL HEALTH EXAM:  There were no vitals taken for this visit.There is no height or weight on file to calculate BMI.  General Appearance: unable to  assess  Eye Contact:  unable to assess  Speech:  Clear and Coherent  Volume:  Normal  Mood:  Euthymic  Affect:  unable to assess  Thought Process:  Goal Directed  Orientation:  Full (Time, Place, and Person)  Thought Content: Logical   Suicidal Thoughts:  No  Homicidal Thoughts:  No  Memory:  WNL  Judgement:  Good  Insight:  Good  Psychomotor Activity:  unable to assess  Concentration:  Concentration: Good  Recall:  Good  Fund of Knowledge: Good  Language: Good  Assets:  Desire for Improvement  ADL's:   Intact  Cognition: WNL  Prognosis:  Good    DIAGNOSES:    ICD-10-CM   1. Major depressive disorder, recurrent episode, moderate (HCC)  F33.1   2. PTSD (post-traumatic stress disorder)  F43.10     Receiving Psychotherapy: No   RECOMMENDATIONS:  Wean off Zoloft 50 mg, 1 p.o. daily for 1 week, then one half p.o. daily for 1 week and then stop. Increase Wellbutrin XL to 300 mg daily. Return in 6 weeks.  [name] Moat, PA-C   This record has been created using [employer].  Chart creation errors have been sought, but may not always have been located and corrected. Such creation errors do not reflect on the standard of medical care.

## 2019-07-03 ENCOUNTER — Encounter (HOSPITAL_BASED_OUTPATIENT_CLINIC_OR_DEPARTMENT_OTHER): Payer: Medicare HMO | Attending: Internal Medicine

## 2019-07-03 ENCOUNTER — Other Ambulatory Visit: Payer: Self-pay

## 2019-07-03 DIAGNOSIS — L97822 Non-pressure chronic ulcer of other part of left lower leg with fat layer exposed: Secondary | ICD-10-CM | POA: Diagnosis not present

## 2019-07-03 DIAGNOSIS — I87322 Chronic venous hypertension (idiopathic) with inflammation of left lower extremity: Secondary | ICD-10-CM | POA: Insufficient documentation

## 2019-07-03 DIAGNOSIS — I890 Lymphedema, not elsewhere classified: Secondary | ICD-10-CM | POA: Insufficient documentation

## 2019-07-10 DIAGNOSIS — I87322 Chronic venous hypertension (idiopathic) with inflammation of left lower extremity: Secondary | ICD-10-CM | POA: Diagnosis not present

## 2019-07-21 ENCOUNTER — Encounter (HOSPITAL_COMMUNITY): Payer: Self-pay

## 2019-07-21 ENCOUNTER — Emergency Department (HOSPITAL_COMMUNITY)
Admission: EM | Admit: 2019-07-21 | Discharge: 2019-07-21 | Disposition: A | Discharge status: Home / Self Care | Payer: Medicare HMO | Attending: Emergency Medicine | Admitting: Emergency Medicine

## 2019-07-21 ENCOUNTER — Other Ambulatory Visit: Payer: Self-pay

## 2019-07-21 ENCOUNTER — Emergency Department (HOSPITAL_BASED_OUTPATIENT_CLINIC_OR_DEPARTMENT_OTHER): Payer: Medicare HMO

## 2019-07-21 DIAGNOSIS — M7989 Other specified soft tissue disorders: Secondary | ICD-10-CM

## 2019-07-21 DIAGNOSIS — R2242 Localized swelling, mass and lump, left lower limb: Secondary | ICD-10-CM | POA: Diagnosis present

## 2019-07-21 DIAGNOSIS — L0390 Cellulitis, unspecified: Secondary | ICD-10-CM | POA: Diagnosis not present

## 2019-07-21 DIAGNOSIS — R61 Generalized hyperhidrosis: Secondary | ICD-10-CM | POA: Diagnosis not present

## 2019-07-21 DIAGNOSIS — R000 Tachycardia, unspecified: Secondary | ICD-10-CM | POA: Diagnosis not present

## 2019-07-21 DIAGNOSIS — L538 Other specified erythematous conditions: Secondary | ICD-10-CM | POA: Diagnosis not present

## 2019-07-21 DIAGNOSIS — F17210 Nicotine dependence, cigarettes, uncomplicated: Secondary | ICD-10-CM | POA: Insufficient documentation

## 2019-07-21 DIAGNOSIS — Z7901 Long term (current) use of anticoagulants: Secondary | ICD-10-CM | POA: Diagnosis not present

## 2019-07-21 DIAGNOSIS — Z79899 Other long term (current) drug therapy: Secondary | ICD-10-CM | POA: Diagnosis not present

## 2019-07-21 DIAGNOSIS — M79609 Pain in unspecified limb: Secondary | ICD-10-CM

## 2019-07-21 DIAGNOSIS — L03116 Cellulitis of left lower limb: Secondary | ICD-10-CM | POA: Diagnosis not present

## 2019-07-21 LAB — CBC WITH DIFFERENTIAL/PLATELET
Abs Immature Granulocytes: 0.02 10*3/uL (ref 0.00–0.07)
Basophils Absolute: 0 10*3/uL (ref 0.0–0.1)
Basophils Relative: 0 %
Eosinophils Absolute: 0.2 10*3/uL (ref 0.0–0.5)
Eosinophils Relative: 2 %
HCT: 44.5 % (ref 39.0–52.0)
Hemoglobin: 14.5 g/dL (ref 13.0–17.0)
Immature Granulocytes: 0 %
Lymphocytes Relative: 18 %
Lymphs Abs: 1.3 10*3/uL (ref 0.7–4.0)
MCH: 27.9 pg (ref 26.0–34.0)
MCHC: 32.6 g/dL (ref 30.0–36.0)
MCV: 85.6 fL (ref 80.0–100.0)
Monocytes Absolute: 0.6 10*3/uL (ref 0.1–1.0)
Monocytes Relative: 8 %
Neutro Abs: 5.3 10*3/uL (ref 1.7–7.7)
Neutrophils Relative %: 72 %
Platelets: 158 10*3/uL (ref 150–400)
RBC: 5.2 MIL/uL (ref 4.22–5.81)
RDW: 13.3 % (ref 11.5–15.5)
WBC: 7.4 10*3/uL (ref 4.0–10.5)
nRBC: 0 % (ref 0.0–0.2)

## 2019-07-21 LAB — BASIC METABOLIC PANEL
Anion gap: 8 (ref 5–15)
BUN: 13 mg/dL (ref 6–20)
CO2: 27 mmol/L (ref 22–32)
Calcium: 8.4 mg/dL — ABNORMAL LOW (ref 8.9–10.3)
Chloride: 102 mmol/L (ref 98–111)
Creatinine, Ser: 1.13 mg/dL (ref 0.61–1.24)
GFR calc Af Amer: >60 mL/min (ref >60)
GFR calc non Af Amer: >60 mL/min (ref >60)
Glucose, Bld: 180 mg/dL — ABNORMAL HIGH (ref 70–99)
Potassium: 5.4 mmol/L — ABNORMAL HIGH (ref 3.5–5.1)
Sodium: 137 mmol/L (ref 135–145)

## 2019-07-21 MED ORDER — CLINDAMYCIN PHOSPHATE 900 MG/50ML IV SOLN
900.0000 mg | Freq: Once | INTRAVENOUS | Status: AC
  Administered 2019-07-21: 900 mg via INTRAVENOUS
  Filled 2019-07-21: qty 50

## 2019-07-21 MED ORDER — MORPHINE SULFATE (PF) 4 MG/ML IV SOLN
4.0000 mg | Freq: Once | INTRAVENOUS | Status: AC
  Administered 2019-07-21: 4 mg via INTRAVENOUS
  Filled 2019-07-21: qty 1

## 2019-07-21 MED ORDER — CLINDAMYCIN HCL 150 MG PO CAPS: 300.0000 mg | ORAL_CAPSULE | Freq: Three times a day (TID) | ORAL | 0 refills | Status: AC

## 2019-07-21 MED ORDER — MORPHINE SULFATE (PF) 4 MG/ML IV SOLN
4.0000 mg | Freq: Once | INTRAVENOUS | Status: AC
  Administered 2019-07-21: 13:00:00 4 mg via INTRAVENOUS
  Filled 2019-07-21: qty 1

## 2019-07-21 NOTE — ED Provider Notes (Signed)
[facility]-EMERGENCY DEPT Provider Note   CSN: [id number] Arrival date & time: 07/21/19  7353     History   Chief Complaint Chief Complaint  Patient presents with  . Leg Swelling    HPI [name] is a 43 y.o. male.     HPI Patient with a history of coagulopathy, chronic leg wounds presents with concern of worsening pain, swelling, erythema about his left lower leg. Patient has ongoing therapy with our wound care center. He notes that at his last visit about 1 week ago, his left leg was approximately 80% the size of his right leg. In the interval he has developed increasing left lower leg swelling, new erythema around an anterior open wound. He has had concurrent increase in his baseline pain, not improved with home narcotics. No fever, no nausea, no vomiting, no dyspnea, no chest pain. He notes that he has been compliant with his Xarelto, though he has had prior thrombosis in spite of this medication. Past Medical History:  Diagnosis Date  . Arthritis   . DVT (deep venous thrombosis) (HCC)   . PE (pulmonary embolism)     Patient Active Problem List   Diagnosis Date Noted  . Abnormal dreams 07/10/2017  . Hypnagogic hallucinations 07/10/2017  . Sleep paralysis, recurrent isolated 07/10/2017  . Meralgia paresthetica of right side 07/10/2017  . Treatment-emergent central sleep apnea 02/26/2017  . Excessive daytime sleepiness 02/26/2017  . Other pulmonary embolism without acute cor pulmonale (HCC) 02/26/2017    Past Surgical History:  Procedure Laterality Date  . FRACTURE SURGERY     plate on R forearm with external fixation  . VENA CAVA FILTER PLACEMENT          Home Medications    Prior to Admission medications   Medication Sig Start Date End Date Taking? Authorizing Provider  buPROPion (WELLBUTRIN XL) 300 MG 24 hr tablet Take 1 tablet (300 mg total) by mouth daily. 07/02/19  Yes Hurst, Rosey Bath T, PA-C  gabapentin (NEURONTIN) 800 MG  tablet Take 800-1,600 mg by mouth 3 (three) times daily. Take 800mg  in the morning, 800mg  in the afternoon, and 1600mg  at bedtime   Yes [provider]  methadone (DOLOPHINE) 10 MG tablet Take 10 mg by mouth 3 (three) times daily.    Yes [provider]  NUCYNTA 50 MG tablet Take 50 mg by mouth 3 (three) times daily.  07/09/17  Yes [provider]  rivaroxaban (XARELTO) 20 MG TABS tablet Take 20 mg by mouth at bedtime.    Yes [provider]    Family History Family History  Problem Relation Age of Onset  . Diabetes Mother   . Diabetes Father   . Deep vein thrombosis Father   . Diabetes Sister   . Diabetes Brother   . Deep vein thrombosis Brother     Social History Social History   Tobacco Use  . Smoking status: Current Every Day Smoker    Packs/day: 1.00    Types: Cigarettes  . Smokeless tobacco: Never Used  Substance Use Topics  . Alcohol use: No    Comment: 1-2 per month  . Drug use: No     Allergies   Patient has no known allergies.   Review of Systems Review of Systems  Constitutional:       Per HPI, otherwise negative  HENT:       Per HPI, otherwise negative  Respiratory:       Per HPI, otherwise negative  Cardiovascular:       Per HPI, otherwise negative  Gastrointestinal: Negative for vomiting.  Endocrine:       Negative aside from HPI  Genitourinary:       Neg aside from HPI   Musculoskeletal:       Per HPI, otherwise negative  Skin: Positive for color change and wound.  Neurological: Negative for syncope.  Hematological:       Per HPI, on Xarelto     Physical Exam Updated Vital Signs BP 119/88   Pulse 78   Temp 98.7 F (37.1 C) (Oral)   Resp 15   Ht 6\' 2"  (1.88 m)   Wt (!) 147.4 kg   SpO2 96%   BMI 41.73 kg/m   Physical Exam Vitals signs and nursing note reviewed.  Constitutional:      Appearance: He is well-developed. He is obese. He is diaphoretic.  HENT:     Head: Normocephalic and  atraumatic.  Eyes:     Conjunctiva/sclera: Conjunctivae normal.  Cardiovascular:     Rate and Rhythm: Regular rhythm. Tachycardia present.  Pulmonary:     Effort: Pulmonary effort is normal. No respiratory distress.     Breath sounds: No stridor.  Abdominal:     General: There is no distension.  Musculoskeletal:       Legs:  Skin:    General: Skin is warm.  Neurological:     Mental Status: He is alert and oriented to person, place, and time.      ED Treatments / Results  Labs (all labs ordered are listed, but only abnormal results are displayed) Labs Reviewed  BASIC METABOLIC PANEL - Abnormal; Notable for the following components:      Result Value   Potassium 5.4 (*)    Glucose, Bld 180 (*)    Calcium 8.4 (*)    All other components within normal limits  CBC WITH DIFFERENTIAL/PLATELET    EKG None  Radiology No results found.  Procedures Procedures (including critical care time)  Medications Ordered in ED Medications  morphine 4 MG/ML injection 4 mg (4 mg Intravenous Given 07/21/19 1000)  clindamycin (CLEOCIN) IVPB 900 mg (0 mg Intravenous Stopped 07/21/19 1134)     Initial Impression / Assessment and Plan / ED Course  I have reviewed the triage vital signs and the nursing notes.  Pertinent labs & imaging results that were available during my care of the patient were reviewed by me and considered in my medical decision making (see chart for details).    On repeat exam the patient is substantially better, having received analgesia, via morphine.  Update: I discussed the patient's case with our sonographer, no evidence for new DVT, patient does have persistent evidence for chronic thrombosis.  Update:, Patient in similar condition.    12:19 PM Pain remains well controlled, patient is not hypotensive, febrile, with no leukocytosis, there is no evidence for bacteremia, low suspicion for sepsis. With no evidence for acute new DVT, no respiratory complaints,  little suspicion for new thrombosis in spite of his appropriate therapy. Patient has received initial IV antibiotics for cellulitis, which was tolerated without adverse reaction, and now with appropriate pain management, the patient will be discharged to follow-up with his wound care center tomorrow.  Final Clinical Impressions(s) / ED Diagnoses   Final diagnoses:  Cellulitis of left lower extremity    ED Discharge Orders         Ordered    clindamycin (CLEOCIN) 150 MG capsule  3 times daily     07/21/19 1222           Carmin Muskrat, MD 07/21/19 1222

## 2019-07-21 NOTE — Discharge Instructions (Addendum)
As discussed, it is important that you maintain your follow-up appointment tomorrow with the wound care center. If you develop new, or concerning changes in your condition, return here for further evaluation.

## 2019-07-21 NOTE — ED Notes (Signed)
US at bedside

## 2019-07-21 NOTE — ED Triage Notes (Addendum)
Pt presents with c/o concern for a DVT in his left lower leg. Pt has significant left leg swelling, redness to his left lower leg and the leg is warm to the touch. Pt is in wound care for a wound on his left lower leg. Pt has had both DVT's and PE's in the past.

## 2019-07-22 DIAGNOSIS — I87322 Chronic venous hypertension (idiopathic) with inflammation of left lower extremity: Secondary | ICD-10-CM | POA: Diagnosis not present

## 2019-07-29 DIAGNOSIS — I87322 Chronic venous hypertension (idiopathic) with inflammation of left lower extremity: Secondary | ICD-10-CM | POA: Diagnosis not present

## 2019-08-05 ENCOUNTER — Encounter (HOSPITAL_BASED_OUTPATIENT_CLINIC_OR_DEPARTMENT_OTHER): Payer: Medicare HMO | Attending: Internal Medicine | Admitting: Internal Medicine

## 2019-08-05 ENCOUNTER — Other Ambulatory Visit: Payer: Self-pay

## 2019-08-05 DIAGNOSIS — L97822 Non-pressure chronic ulcer of other part of left lower leg with fat layer exposed: Secondary | ICD-10-CM | POA: Insufficient documentation

## 2019-08-05 DIAGNOSIS — G629 Polyneuropathy, unspecified: Secondary | ICD-10-CM | POA: Insufficient documentation

## 2019-08-05 DIAGNOSIS — Z86718 Personal history of other venous thrombosis and embolism: Secondary | ICD-10-CM | POA: Diagnosis not present

## 2019-08-05 DIAGNOSIS — I87332 Chronic venous hypertension (idiopathic) with ulcer and inflammation of left lower extremity: Secondary | ICD-10-CM | POA: Insufficient documentation

## 2019-08-05 DIAGNOSIS — I890 Lymphedema, not elsewhere classified: Secondary | ICD-10-CM | POA: Insufficient documentation

## 2019-08-05 DIAGNOSIS — D6859 Other primary thrombophilia: Secondary | ICD-10-CM | POA: Insufficient documentation

## 2019-08-05 DIAGNOSIS — G4730 Sleep apnea, unspecified: Secondary | ICD-10-CM | POA: Diagnosis not present
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
## 2019-08-12 ENCOUNTER — Encounter (HOSPITAL_BASED_OUTPATIENT_CLINIC_OR_DEPARTMENT_OTHER): Payer: Medicare HMO | Attending: Internal Medicine | Admitting: Internal Medicine

## 2019-08-12 DIAGNOSIS — Z86718 Personal history of other venous thrombosis and embolism: Secondary | ICD-10-CM | POA: Diagnosis not present

## 2019-08-12 DIAGNOSIS — D6869 Other thrombophilia: Secondary | ICD-10-CM | POA: Diagnosis not present

## 2019-08-12 DIAGNOSIS — G4730 Sleep apnea, unspecified: Secondary | ICD-10-CM | POA: Diagnosis not present

## 2019-08-12 DIAGNOSIS — I890 Lymphedema, not elsewhere classified: Secondary | ICD-10-CM | POA: Insufficient documentation

## 2019-08-12 DIAGNOSIS — I87322 Chronic venous hypertension (idiopathic) with inflammation of left lower extremity: Secondary | ICD-10-CM | POA: Diagnosis not present

## 2019-08-12 DIAGNOSIS — L97822 Non-pressure chronic ulcer of other part of left lower leg with fat layer exposed: Secondary | ICD-10-CM | POA: Diagnosis present

## 2019-08-19 ENCOUNTER — Encounter (HOSPITAL_BASED_OUTPATIENT_CLINIC_OR_DEPARTMENT_OTHER): Payer: Medicare HMO | Admitting: Internal Medicine

## 2019-08-19 ENCOUNTER — Other Ambulatory Visit: Payer: Self-pay

## 2019-08-19 DIAGNOSIS — I87332 Chronic venous hypertension (idiopathic) with ulcer and inflammation of left lower extremity: Secondary | ICD-10-CM | POA: Diagnosis not present

## 2019-08-19 NOTE — Progress Notes (Signed)
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 swelling or drainage he may have to contact Dr. Latanya Presser Electronic Signature(s) Signed: 08/19/2019 6:17:56 PM By: Baltazar Najjar MD Entered By: Baltazar Najjar on 08/19/2019 08:35:59 -------------------------------------------------------------------------------- SuperBill Details Patient Name: Date of Service: [name], [name] 08/19/2019 Medical Record  [id number] Patient Account Number: [id number] Date of Birth/Sex: Treating RN: 05-01-1976 (43 y.o. [name] Primary Care Provider: [name], [name] Other Clinician: Referring Provider: Treating Provider/Extender:Latora Quarry, [name], [name] in Treatment: 16 Diagnosis Coding ICD-10 Codes Code Description I87.322 Chronic venous hypertension (idiopathic) with inflammation of left lower extremity L97.821 Non-pressure chronic ulcer of other part of left lower leg limited to breakdown of skin I89.0 Lymphedema, not elsewhere classified D68.69 Other thrombophilia L03.116 Cellulitis of left lower limb Facility Procedures CPT4 Code Description: [id number] (Facility Use Only) 29581LT - APPLY MULTLAY COMPRS LWR LT LEG Modifier: Quantity: 1 Physician Procedures CPT4 Code Description: 2841324 99213 - WC PHYS LEVEL 3 - EST PT ICD-10 Diagnosis Description L97.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

## 2019-08-26 ENCOUNTER — Other Ambulatory Visit: Payer: Self-pay

## 2019-08-26 ENCOUNTER — Encounter (HOSPITAL_BASED_OUTPATIENT_CLINIC_OR_DEPARTMENT_OTHER): Payer: Medicare HMO | Admitting: Internal Medicine

## 2019-08-26 DIAGNOSIS — I87332 Chronic venous hypertension (idiopathic) with ulcer and inflammation of left lower extremity: Secondary | ICD-10-CM | POA: Diagnosis not present

## 2019-09-02 ENCOUNTER — Other Ambulatory Visit: Payer: Self-pay

## 2019-09-02 ENCOUNTER — Encounter (HOSPITAL_BASED_OUTPATIENT_CLINIC_OR_DEPARTMENT_OTHER): Payer: Medicare HMO | Attending: Internal Medicine | Admitting: Internal Medicine

## 2019-09-02 DIAGNOSIS — L97821 Non-pressure chronic ulcer of other part of left lower leg limited to breakdown of skin: Secondary | ICD-10-CM | POA: Diagnosis not present

## 2019-09-02 DIAGNOSIS — L97528 Non-pressure chronic ulcer of other part of left foot with other specified severity: Secondary | ICD-10-CM | POA: Insufficient documentation

## 2019-09-02 DIAGNOSIS — I890 Lymphedema, not elsewhere classified: Secondary | ICD-10-CM | POA: Insufficient documentation

## 2019-09-02 DIAGNOSIS — I87332 Chronic venous hypertension (idiopathic) with ulcer and inflammation of left lower extremity: Secondary | ICD-10-CM | POA: Insufficient documentation

## 2019-09-02 DIAGNOSIS — D6869 Other thrombophilia: Secondary | ICD-10-CM | POA: Diagnosis not present

## 2019-09-08 ENCOUNTER — Other Ambulatory Visit: Payer: Self-pay | Admitting: Physician Assistant

## 2019-09-09 ENCOUNTER — Other Ambulatory Visit: Payer: Self-pay

## 2019-09-09 ENCOUNTER — Encounter (HOSPITAL_BASED_OUTPATIENT_CLINIC_OR_DEPARTMENT_OTHER): Payer: Medicare HMO | Admitting: Internal Medicine

## 2019-09-09 DIAGNOSIS — I87332 Chronic venous hypertension (idiopathic) with ulcer and inflammation of left lower extremity: Secondary | ICD-10-CM | POA: Diagnosis not present

## 2019-09-16 ENCOUNTER — Encounter (HOSPITAL_BASED_OUTPATIENT_CLINIC_OR_DEPARTMENT_OTHER): Payer: Medicare HMO | Admitting: Internal Medicine

## 2019-09-16 ENCOUNTER — Other Ambulatory Visit: Payer: Self-pay

## 2019-09-16 DIAGNOSIS — I87332 Chronic venous hypertension (idiopathic) with ulcer and inflammation of left lower extremity: Secondary | ICD-10-CM | POA: Diagnosis not present

## 2019-09-23 ENCOUNTER — Other Ambulatory Visit: Payer: Self-pay

## 2019-09-23 ENCOUNTER — Encounter (HOSPITAL_BASED_OUTPATIENT_CLINIC_OR_DEPARTMENT_OTHER): Payer: Medicare HMO

## 2019-09-23 DIAGNOSIS — I87332 Chronic venous hypertension (idiopathic) with ulcer and inflammation of left lower extremity: Secondary | ICD-10-CM | POA: Diagnosis not present

## 2019-09-23 NOTE — Progress Notes (Signed)
BRAEDEN, KENNAN ([id number]) Visit Report for 09/23/2019 SuperBill Details Patient Name: Date of Service: JEANLUC, WEGMAN 09/23/2019 Medical Record [id number] Patient Account Number: [id number] Date of Birth/Sex: Treating RN: 1976-01-18 (43 y.o. [name] Primary Care Provider: [name], [name] Other Clinician: Referring Provider: Treating Provider/Extender:Stone III, Sharen Counter, [name] in Treatment: 21 Diagnosis Coding ICD-10 Codes Code Description [phone] Non-pressure chronic ulcer of other part of left lower leg limited to breakdown of skin I87.322 Chronic venous hypertension (idiopathic) with inflammation of left lower extremity L97.528 Non-pressure chronic ulcer of other part of left foot with other specified severity I89.0 Lymphedema, not elsewhere classified D68.69 Other thrombophilia Facility Procedures CPT4 Code Description Modifier Quantity [id number] (Facility Use Only) [phone] - APPLY MULTLAY COMPRS LWR LT LEG 1 Electronic Signature(s) Signed: 09/23/2019 1:35:50 PM By: Baruch Gouty RN, BSN Signed: 09/23/2019 10:29:27 PM By: Worthy Keeler PA-C Entered By: Baruch Gouty on 09/23/2019 12:16:08

## 2019-09-23 NOTE — Progress Notes (Signed)
BAKARI, NIKOLAI ([id number]) Visit Report for 09/23/2019 Arrival Information Details Patient Name: Date of Service: CLYDE, ZARRELLA 09/23/2019 12:30 PM Medical Record [id number] Patient Account Number: [id number] Date of Birth/Sex: Treating RN: February 21, 1976 (43 y.o. [name] Primary Care Zaylia Riolo: [name], [name] Other Clinician: Referring Jaz Laningham: Treating Alphonsine Minium/Extender:Stone III, Lizabeth Leyden, [name] in Treatment: 21 Visit Information History Since Last Visit Added or deleted any medications: No Patient Arrived: Ambulatory Any new allergies or adverse reactions: No Arrival Time: 11:47 Had a fall or experienced change in No Accompanied By: self activities of daily living that may affect Transfer Assistance: None risk of falls: Patient Identification Verified: Yes Signs or symptoms of abuse/neglect since last No Secondary Verification Process Yes visito Completed: Hospitalized since last visit: No Patient Requires Transmission-Based No Implantable device outside of the clinic excluding No Precautions: cellular tissue based products placed in the center Patient Has Alerts: No since last visit: Has Dressing in Place as Prescribed: Yes Has Compression in Place as Prescribed: Yes Pain Present Now: Yes Electronic Signature(s) Signed: 09/23/2019 1:35:50 PM By: [name] RN, BSN Entered By: [name] on 09/23/2019 11:47:27 -------------------------------------------------------------------------------- Compression Therapy Details Patient Name: Date of Service: DERYK, BOZMAN 09/23/2019 12:30 PM Medical Record [id number] Patient Account Number: [id number] Date of Birth/Sex: Treating RN: 1976/03/12 (43 y.o. [name] Primary Care Bertel Venard: [name], [name] Other Clinician: Referring Divonte Senger: Treating Ariyan Brisendine/Extender:Stone III, Lizabeth Leyden, [name] in Treatment: 21 Compression Therapy Performed for Wound Wound #9RR  Left,Anterior Lower Leg Assessment: Performed By: Clinician [name], RN Compression Type: Four Layer Electronic Signature(s) Signed: 09/23/2019 1:35:50 PM By: [name] RN, BSN Entered By: [name] on 09/23/2019 12:12:47 -------------------------------------------------------------------------------- Encounter Discharge Information Details Patient Name: Date of Service: JEREMAINE, MARAJ 09/23/2019 12:30 PM Medical Record [id number] Patient Account Number: [id number] Date of Birth/Sex: Treating RN: 28-Oct-1976 (43 y.o. [name] Primary Care Zarian Colpitts: [name], [name] Other Clinician: Referring Salvator Seppala: Treating Alechia Lezama/Extender:Stone III, Lizabeth Leyden, [name] in Treatment: 21 Encounter Discharge Information Items Discharge Condition: Stable Ambulatory Status: Ambulatory Discharge Destination: Home Transportation: Private Auto Accompanied By: self Schedule Follow-up Appointment: Yes Clinical Summary of Care: Patient Declined Electronic Signature(s) Signed: 09/23/2019 1:35:50 PM By: [name] RN, BSN Entered By: [name] on 09/23/2019 12:15:57 -------------------------------------------------------------------------------- Patient/Caregiver Education Details Patient Name: [name] 11/24/2020andnbsp12:30 Date of Service: PM Medical Record [id number] Number: Patient Account Number: [id number] Treating RN: Date of Birth/Gender: 1975-12-25 (43 y.o. [name]) Other Clinician: Primary Care Physician:Koirala, [name] Treating Lenda Kelp Referring Physician: Physician/Extender: Darrow Bussing Weeks in Treatment: 21 Education Assessment Education Provided To: Patient Education Topics Provided Venous: Methods: Explain/Verbal Responses: Reinforcements needed, State content correctly Electronic Signature(s) Signed: 09/23/2019 1:35:50 PM By: [name] RN, BSN Entered By: [name] on 09/23/2019  12:15:44 -------------------------------------------------------------------------------- Wound Assessment Details Patient Name: Date of Service: ADMIRAL, MARCUCCI 09/23/2019 12:30 PM Medical Record [id number] Patient Account Number: [id number] Date of Birth/Sex: Treating RN: [birth year] (43 y.o. [name] Primary Care Zniya Cottone: [name], [name] Other Clinician: Referring Jaequan Propes: Treating Jauna Raczynski/Extender:Stone III, Lizabeth Leyden, [name] in Treatment: 21 Wound Status Wound Number: 12 Primary Venous Leg Ulcer Etiology: Wound Location: Left Foot - Lateral Wound Open Wounding Event: Gradually Appeared Status: Date Acquired: 09/09/2019 Comorbid Asthma, Sleep Apnea, Deep Vein Weeks Of Treatment: 2 History: Thrombosis, Phlebitis, Osteoarthritis, Clustered Wound: Yes Neuropathy Wound Measurements Length: (cm) 0.3 % Reduc Width: (cm) 2.2 % Reduc Depth: (cm) 0.1 Epithel Clustered Quantity: 2 Tunneli Area: (cm) 0.518 Underm Volume: (cm) 0.052 Wound Description Classification: Full Thickness Without Exposed  Support Structures Wound Flat and Intact Margin: Exudate Small Amount: Exudate Serosanguineous Type: Exudate red, brown Color: Wound Bed Granulation Amount: Small (1-33%) Granulation Quality: Pink Necrotic Amount: None Present (0%) Foul Odor After Cleansing: No Slough/Fibrino Yes Exposed Structure Fascia Exposed: No Fat Layer (Subcutaneous Tissue) Exposed: Yes Tendon Exposed: No Muscle Exposed: No Joint Exposed: No Bone Exposed: No tion in Area: -1002.1% tion in Volume: -940% ialization: Large (67-100%) ng: No ining: No Treatment Notes Wound #12 (Left, Lateral Foot) 2. Periwound Care Moisturizing lotion TCA Cream 3. Primary Dressing Applied Iodoflex 4. Secondary Dressing Foam 6. Support Layer Applied 4 layer compression Water quality scientist) Signed: 09/23/2019 1:35:50 PM By: Baruch Gouty RN, BSN Entered By: Baruch Gouty on 09/23/2019 12:11:57 -------------------------------------------------------------------------------- Wound Assessment Details Patient Name: Date of Service: KEITA, VALLEY 09/23/2019 12:30 PM Medical Record [id number] Patient Account Number: [id number] Date of Birth/Sex: Treating RN: 1976-08-10 (43 y.o. [name] Primary Care Ismar Yabut: [name], [name] Other Clinician: Referring Ryenn Howeth: Treating Tashia Leiterman/Extender:Stone III, Sharen Counter, [name] in Treatment: 21 Wound Status Wound Number: 9RR Primary Venous Leg Ulcer Etiology: Wound Location: Left Lower Leg - Anterior Wound Open Wounding Event: Gradually Appeared Status: Date Acquired: 03/31/2019 Comorbid Asthma, Sleep Apnea, Deep Vein Weeks Of Treatment: 21 History: Thrombosis, Phlebitis, Osteoarthritis, Clustered Wound: No Neuropathy Wound Measurements Length: (cm) 2.7 Width: (cm) 0.9 Depth: (cm) 0.2 Area: (cm) 1.909 Volume: (cm) 0.382 Wound Description Classification: Full Thickness Without Exposed Suppo Structures Wound Flat and Intact Margin: Exudate Large Amount: Exudate Serosanguineous Type: Exudate red, brown Color: Wound Bed Granulation Amount: Large (67-100%) Granulation Quality: Red Necrotic Amount: None Present (0%) dor After Cleansing: No /Fibrino No Exposed Structure posed: No (Subcutaneous Tissue) Exposed: Yes posed: No posed: No osed: No sed: No % Reduction in Area: 85.5% % Reduction in Volume: 71% Epithelialization: Small (1-33%) Tunneling: No Undermining: No rt Foul O Slough Fascia Ex Fat Layer Tendon Ex Muscle Ex Joint Exp Bone Expo Treatment Notes Wound #9RR (Left, Anterior Lower Leg) 2. Periwound Care Barrier cream Moisturizing lotion TCA Cream 3. Primary Dressing Applied Other primary dressing (specifiy in notes) 4. Secondary Dressing ABD Pad Dry Gauze 6. Support Layer Applied 4 layer compression wrap Notes sorbact swab Electronic  Signature(s) Signed: 09/23/2019 1:35:50 PM By: Baruch Gouty RN, BSN Entered By: Baruch Gouty on 09/23/2019 12:12:27 -------------------------------------------------------------------------------- [place] Details Patient Name: Date of Service: ANASTASIO, WOGAN 09/23/2019 12:30 PM Medical Record [id number] Patient Account Number: [id number] Date of Birth/Sex: Treating RN: 06/08/1976 (43 y.o. [name] Primary Care Kambrie Eddleman: [name], [name] Other Clinician: Referring Zakar Brosch: Treating Breon Rehm/Extender:Stone III, Sharen Counter, [name] in Treatment: 21 Vital Signs Time Taken: 11:47 Temperature (F): 98.4 Height (in): 74 Pulse (bpm): 80 Source: Stated Respiratory Rate (breaths/min): 18 Weight (lbs): 325 Blood Pressure (mmHg): 137/80 Source: Stated Reference Range: 80 - 120 mg / dl Body Mass Index (BMI): 41.7 Electronic Signature(s) Signed: 09/23/2019 1:35:50 PM By: Baruch Gouty RN, BSN Entered By: Baruch Gouty on 09/23/2019 11:47:54

## 2019-09-30 ENCOUNTER — Other Ambulatory Visit: Payer: Self-pay

## 2019-09-30 ENCOUNTER — Encounter (HOSPITAL_BASED_OUTPATIENT_CLINIC_OR_DEPARTMENT_OTHER): Payer: Medicare HMO | Attending: Internal Medicine | Admitting: Internal Medicine

## 2019-09-30 DIAGNOSIS — G629 Polyneuropathy, unspecified: Secondary | ICD-10-CM | POA: Diagnosis not present

## 2019-09-30 DIAGNOSIS — L97528 Non-pressure chronic ulcer of other part of left foot with other specified severity: Secondary | ICD-10-CM | POA: Insufficient documentation

## 2019-09-30 DIAGNOSIS — D6869 Other thrombophilia: Secondary | ICD-10-CM | POA: Insufficient documentation

## 2019-09-30 DIAGNOSIS — M1990 Unspecified osteoarthritis, unspecified site: Secondary | ICD-10-CM | POA: Diagnosis not present

## 2019-09-30 DIAGNOSIS — Z86718 Personal history of other venous thrombosis and embolism: Secondary | ICD-10-CM | POA: Insufficient documentation

## 2019-09-30 DIAGNOSIS — L97821 Non-pressure chronic ulcer of other part of left lower leg limited to breakdown of skin: Secondary | ICD-10-CM | POA: Diagnosis not present

## 2019-09-30 DIAGNOSIS — G4730 Sleep apnea, unspecified: Secondary | ICD-10-CM | POA: Insufficient documentation

## 2019-09-30 DIAGNOSIS — J45909 Unspecified asthma, uncomplicated: Secondary | ICD-10-CM | POA: Insufficient documentation

## 2019-09-30 DIAGNOSIS — I890 Lymphedema, not elsewhere classified: Secondary | ICD-10-CM | POA: Insufficient documentation

## 2019-09-30 DIAGNOSIS — L97521 Non-pressure chronic ulcer of other part of left foot limited to breakdown of skin: Secondary | ICD-10-CM | POA: Insufficient documentation

## 2019-10-07 ENCOUNTER — Encounter (HOSPITAL_BASED_OUTPATIENT_CLINIC_OR_DEPARTMENT_OTHER): Payer: Medicare HMO | Admitting: Internal Medicine

## 2019-10-07 NOTE — Progress Notes (Signed)
RASTUS, BORTON ([id number]) Visit Report for 07/03/2019 Arrival Information Details Patient Name: Date of Service: [name], [name] 07/03/2019 7:30 AM Medical Record [id number] Patient Account Number: [id number] Date of Birth/Sex: Treating RN: December 19, 1975 (43 y.o. [name]) Carlene Coria Primary Care Elohim Brune: [name], [name] Other Clinician: Referring Jerre Diguglielmo: Treating Pablo Mathurin/Extender:[name], [name], [name] in Treatment: 10 Visit Information History Since Last Visit All ordered tests and consults were completed: No Patient Arrived: Ambulatory Added or deleted any medications: No Arrival Time: 08:00 Any new allergies or adverse reactions: No Accompanied By: self Had a fall or experienced change in No Transfer Assistance: None activities of daily living that may affect Patient Identification Verified: Yes risk of falls: Secondary Verification Process Completed: Yes Signs or symptoms of abuse/neglect since last No Patient Requires Transmission-Based No visito Precautions: Hospitalized since last visit: No Patient Has Alerts: Yes Implantable device outside of the clinic excluding No Patient Alerts: ABI L 1.07 cellular tissue based products placed in the center since last visit: Pain Present Now: No Electronic Signature(s) Signed: 10/07/2019 3:04:22 PM By: Carlene Coria RN Entered By: Carlene Coria on 07/03/2019 08:01:26 -------------------------------------------------------------------------------- Compression Therapy Details Patient Name: Date of Service: [name], [name] 07/03/2019 7:30 AM Medical Record [id number] Patient Account Number: [id number] Date of Birth/Sex: Treating RN: [birth year] (43 y.o. [name] Primary Care Waleed Dettman: [name], [name] Other Clinician: Referring Dewayne Severe: Treating Eulan Heyward/Extender:[name], [name], [name] in Treatment: 10 Compression Therapy Performed for Wound Wound #9R Left,Anterior Lower  Leg Assessment: Performed By: Clinician Carlene Coria, RN Compression Type: Four Layer Pre Treatment ABI: 1.1 Post Procedure Diagnosis Same as Pre-procedure Electronic Signature(s) Signed: 07/03/2019 5:44:03 PM By: Deon Pilling Entered By: Deon Pilling on 07/03/2019 08:21:48 -------------------------------------------------------------------------------- Encounter Discharge Information Details Patient Name: Date of Service: [name], [name] 07/03/2019 7:30 AM Medical Record [id number] Patient Account Number: [id number] Date of Birth/Sex: Treating RN: Jan 10, 1976 (43 y.o. [name] Primary Care Elven Laboy: [name], [name] Other Clinician: Referring Shir Bergman: Treating Jerell Demery/Extender:[name], [name], [name] in Treatment: 10 Encounter Discharge Information Items Discharge Condition: Stable Ambulatory Status: Ambulatory Discharge Destination: Home Transportation: Private Auto Accompanied By: alone Schedule Follow-up Appointment: Yes Clinical Summary of Care: Patient Declined Electronic Signature(s) Signed: 07/09/2019 6:55:12 PM By: [name] RN, BSN Entered By: [name] on 07/03/2019 08:41:48 -------------------------------------------------------------------------------- Lower Extremity Assessment Details Patient Name: Date of Service: [name], [name] 07/03/2019 7:30 AM Medical Record [id number] Patient Account Number: [id number] Date of Birth/Sex: Treating RN: 05/24/1976 (43 y.o. [name]) Carlene Coria Primary Care Deundre Thong: [name], [name] Other Clinician: Referring Effrey Davidow: Treating Malini Flemings/Extender:[name], [name], [name] in Treatment: 10 Edema Assessment Assessed: [Left: No] [Right: No] Edema: [Left: Ye] [Right: s] Calf Left: Right: Point of Measurement: 40 cm From Medial Instep 43 cm cm Ankle Left: Right: Point of Measurement: 12 cm From Medial Instep 25 cm cm Electronic Signature(s) Signed: 10/07/2019 3:04:22 PM By: Carlene Coria RN Entered By: Carlene Coria on 07/03/2019 08:05:27 -------------------------------------------------------------------------------- Multi Wound Chart Details Patient Name: Date of Service: [name], [name] 07/03/2019 7:30 AM Medical Record [id number] Patient Account Number: [id number] Date of Birth/Sex: Treating RN: 07-06-1976 (43 y.o. [name] Primary Care Camaryn Lumbert: [name], [name] Other Clinician: Referring Aidynn Polendo: Treating Zyriah Mask/Extender:[name], [name], [name] in Treatment: 10 Vital Signs Height(in): 74 Pulse(bpm): 91 Weight(lbs): 325 Blood Pressure(mmHg): 146/91 Body Mass Index(BMI): 42 Temperature(F): 98.3 Respiratory 18 Rate(breaths/min): Photos: [9R:No Photos] [N/A:N/A] Wound Location: [9R:Left Lower Leg - Anterior] [N/A:N/A] Wounding Event: [9R:Gradually Appeared] [N/A:N/A] Primary Etiology: [9R:Venous Leg Ulcer] [N/A:N/A] Comorbid History: [9R:Asthma, Sleep Apnea, DeepN/A Vein Thrombosis, Phlebitis, Osteoarthritis, Neuropathy]  Date Acquired: [9R:03/31/2019] [N/A:N/A] Weeks of Treatment: [9R:10] [N/A:N/A] Wound Status: [9R:Open] [N/A:N/A] Wound Recurrence: [9R:Yes] [N/A:N/A] Measurements L x W x D 0.9x0.4x0.1 [N/A:N/A] (cm) Area (cm) : [9R:0.283] [N/A:N/A] Volume (cm) : [9R:0.028] [N/A:N/A] % Reduction in Area: [9R:97.90%] [N/A:N/A] % Reduction in Volume: 97.90% [N/A:N/A] Classification: [9R:Full Thickness Without Exposed Support Structures] [N/A:N/A] Exudate Amount: [9R:Small] [N/A:N/A] Exudate Type: [9R:Serous] [N/A:N/A] Exudate Color: [9R:amber] [N/A:N/A] Wound Margin: [9R:Flat and Intact] [N/A:N/A] Granulation Amount: [9R:None Present (0%)] [N/A:N/A] Necrotic Amount: [9R:Large (67-100%)] [N/A:N/A] Necrotic Tissue: [9R:Eschar] [N/A:N/A] Exposed Structures: [9R:Fascia: No Fat Layer (Subcutaneous Tissue) Exposed: No Tendon: No Muscle: No Joint: No Bone: No] [N/A:N/A] Epithelialization: [9R:Large (67-100%) Compression  Therapy] [N/A:N/A N/A] Treatment Notes Electronic Signature(s) Signed: 07/03/2019 5:10:59 PM By: Linton Ham MD Signed: 07/03/2019 5:44:03 PM By: Deon Pilling Entered By: Linton Ham on 07/03/2019 08:39:16 -------------------------------------------------------------------------------- Multi-Disciplinary Care Plan Details Patient Name: Date of Service: [name], [name] 07/03/2019 7:30 AM Medical Record [id number] Patient Account Number: [id number] Date of Birth/Sex: Treating RN: Apr 14, 1976 (43 y.o. [name] Primary Care Amorina Doerr: [name], [name] Other Clinician: Referring Jaydee Conran: Treating Lavontay Kirk/Extender:[name], [name], [name] in Treatment: 10 Active Inactive Wound/Skin Impairment Nursing Diagnoses: Knowledge deficit related to ulceration/compromised skin integrity Goals: Patient/caregiver will verbalize understanding of skin care regimen Date Initiated: 04/24/2019 Date Inactivated: 05/15/2019 Target Resolution Date: 05/23/2019 Goal Status: Met Ulcer/skin breakdown will heal within 14 weeks Date Initiated: 04/24/2019 Target Resolution Date: 08/29/2019 Goal Status: Active Interventions: Assess patient/caregiver ability to obtain necessary supplies Assess patient/caregiver ability to perform ulcer/skin care regimen upon admission and as needed Provide education on ulcer and skin care Treatment Activities: Skin care regimen initiated : 04/24/2019 Topical wound management initiated : 04/24/2019 Notes: Electronic Signature(s) Signed: 07/03/2019 5:44:03 PM By: Deon Pilling Entered By: Deon Pilling on 07/03/2019 07:58:12 -------------------------------------------------------------------------------- Pain Assessment Details Patient Name: Date of Service: [name], [name] 07/03/2019 7:30 AM Medical Record [id number] Patient Account Number: [id number] Date of Birth/Sex: Treating RN: 12/19/1975 (43 y.o. [name]) Carlene Coria Primary Care Amere Iott: [name],  [name] Other Clinician: Referring Anush Wiedeman: Treating Ezma Rehm/Extender:[name], [name], [name] in Treatment: 10 Active Problems Location of Pain Severity and Description of Pain Patient Has Paino No Site Locations Pain Management and Medication Current Pain Management: Electronic Signature(s) Signed: 10/07/2019 3:04:22 PM By: Carlene Coria RN Entered By: Carlene Coria on 07/03/2019 08:02:05 -------------------------------------------------------------------------------- Patient/Caregiver Education Details Patient Name: Date of Service: [name] [name] 9/3/2020andnbsp7:30 AM Medical Record [id number] Patient Account Number: [id number] Date of Birth/Gender: 11-30-1975 (43 y.o. M) Treating RN: Deon Pilling Primary Care Physician: [name], [name] Other Clinician: Referring Physician: Treating Physician/Extender:[name], [name], [name] in Treatment: 10 Education Assessment Education Provided To: Patient Education Topics Provided Wound/Skin Impairment: Handouts: Skin Care Do's and Dont's Methods: Explain/Verbal Responses: Reinforcements needed Electronic Signature(s) Signed: 07/03/2019 5:44:03 PM By: Deon Pilling Entered By: Deon Pilling on 07/03/2019 07:58:23 -------------------------------------------------------------------------------- Wound Assessment Details Patient Name: Date of Service: King, Pinzon 07/03/2019 7:30 AM Medical Record [id number] Patient Account Number: [id number] Date of Birth/Sex: Treating RN: 06/23/1976 (43 y.o. [name]) Carlene Coria Primary Care Alee Gressman: [name], [name] Other Clinician: Referring Santanna Olenik: Treating Jilene Spohr/Extender:[name], [name], [name] in Treatment: 10 Wound Status Wound Number: 9R Primary Venous Leg Ulcer Etiology: Wound Location: Left Lower Leg - Anterior Wound Open Wounding Event: Gradually Appeared Status: Date Acquired: 03/31/2019 Comorbid Asthma, Sleep Apnea, Deep Vein  Thrombosis, Weeks Of Treatment: 10 History: Phlebitis, Osteoarthritis, Neuropathy Clustered Wound: No Photos Wound Measurements Length: (cm) 0.9 % Reduct Width: (cm) 0.4 % Reduct Depth: (cm) 0.1 Epitheli Area: (cm) 0.283 Tunneli Volume: (  cm) 0.028 Undermi Wound Description Classification: Full Thickness Without Exposed Support Foul Odo Structures Slough/F Wound Flat and Intact Margin: Exudate Small Amount: Exudate Serous Type: Exudate amber Color: Wound Bed Granulation Amount: None Present (0%) Necrotic Amount: Large (67-100%) Fascia E Necrotic Quality: Eschar Fat Laye Tendon E Muscle E Joint Ex Bone Exp r After Cleansing: No ibrino Yes Exposed Structure xposed: No r (Subcutaneous Tissue) Exposed: No xposed: No xposed: No posed: No osed: No ion in Area: 97.9% ion in Volume: 97.9% alization: Large (67-100%) ng: No ning: No Electronic Signature(s) Signed: 07/04/2019 4:32:29 PM By: Mikeal Hawthorne EMT/HBOT Signed: 10/07/2019 3:04:22 PM By: Carlene Coria RN Entered By: Mikeal Hawthorne on 07/04/2019 08:34:14 -------------------------------------------------------------------------------- Vitals Details Patient Name: Date of Service: Mesiah, Manzo 07/03/2019 7:30 AM Medical Record [id number] Patient Account Number: [id number] Date of Birth/Sex: Treating RN: [birth year] (43 y.o. Oval Linsey Primary Care Monserat Prestigiacomo: [name], [name] Other Clinician: Referring Kashawna Manzer: Treating Eithen Castiglia/Extender:[name], [name], [name] in Treatment: 10 Vital Signs Time Taken: 08:01 Temperature (F): 98.3 Height (in): 74 Pulse (bpm): 91 Weight (lbs): 325 Respiratory Rate (breaths/min): 18 Body Mass Index (BMI): 41.7 Blood Pressure (mmHg): 146/91 Reference Range: 80 - 120 mg / dl Electronic Signature(s) Signed: 10/07/2019 3:04:22 PM By: Carlene Coria RN Entered By: Carlene Coria on 07/03/2019 08:01:57

## 2019-10-07 NOTE — Progress Notes (Signed)
[name], [name] ([id number]) Visit Report for 07/10/2019 Arrival Information Details Patient Name: Date of Service: [name], [name] 07/10/2019 7:30 AM Medical Record [id number] Patient Account Number: [id number] Date of Birth/Sex: Treating RN: 1976/07/10 (43 y.o. [name] PetitM) [name] PaxEpps, [name] Primary Care [name]: [name] ChuckKoirala, [name] Other Clinician: Referring [name]: Treating [name]/Extender:Madduri, [name] RichardsMurthy [name], [name] Weeks in Treatment: 11 Visit Information History Since Last Visit All ordered tests and consults were completed: No Patient Arrived: Ambulatory Added or deleted any medications: No Arrival Time: 07:52 Any new allergies or adverse reactions: No Accompanied By: self Had a fall or experienced change in No Transfer Assistance: None activities of daily living that may affect Patient Identification Verified: Yes risk of falls: Secondary Verification Process Yes Signs or symptoms of abuse/neglect since last No Completed: visito Patient Requires Transmission-Based No Hospitalized since last visit: No Precautions: Implantable device outside of the clinic excluding No Patient Has Alerts: Yes cellular tissue based products placed in the center Patient Alerts: ABI L 1.07 since last visit: Has Dressing in Place as Prescribed: Yes Pain Present Now: No Electronic Signature(s) Signed: 10/07/2019 3:04:22 PM By: [name] PaxEpps, Carrie RN Entered By: [name] PaxEpps, [name] on 07/10/2019 07:53:35 -------------------------------------------------------------------------------- Clinic Level of Care Assessment Details Patient Name: Date of Service: [name], [name] 07/10/2019 7:30 AM Medical Record [id number] Patient Account Number: [id number] Date of Birth/Sex: Treating RN: 1976/07/10 (43 y.o. [name] SoursM) [name] Primary Care Lailah Marcelli: [name] ChuckKoirala, [name] Other Clinician: Referring Dashawn Golda: Treating [name]/Extender:Madduri, [name] RichardsMurthy [name], [name] Weeks in Treatment: 11 Clinic Level of Care Assessment  Items TOOL 4 Quantity Score X - Use when only an EandM is performed on FOLLOW-UP visit 1 0 ASSESSMENTS - Nursing Assessment / Reassessment X - Reassessment of Co-morbidities (includes updates in patient status) 1 10 X - Reassessment of Adherence to Treatment Plan 1 5 ASSESSMENTS - Wound and Skin Assessment / Reassessment X - Simple Wound Assessment / Reassessment - one wound 1 5 []  - Complex Wound Assessment / Reassessment - multiple wounds 0 X - Dermatologic / Skin Assessment (not related to wound area) 1 10 ASSESSMENTS - Focused Assessment X - Circumferential Edema Measurements - multi extremities 1 5 X - Nutritional Assessment / Counseling / Intervention 1 10 []  - Lower Extremity Assessment (monofilament, tuning fork, pulses) 0 []  - Peripheral Arterial Disease Assessment (using hand held doppler) 0 ASSESSMENTS - Ostomy and/or Continence Assessment and Care []  - Incontinence Assessment and Management 0 []  - Ostomy Care Assessment and Management (repouching, etc.) 0 PROCESS - Coordination of Care X - Simple Patient / Family Education for ongoing care 1 15 []  - Complex (extensive) Patient / Family Education for ongoing care 0 X - Staff obtains ChiropractorConsents, Records, Test Results / Process Orders 1 10 X - Staff telephones HHA, Nursing Homes / Clarify orders / etc 1 10 []  - Routine Transfer to another Facility (non-emergent condition) 0 []  - Routine Hospital Admission (non-emergent condition) 0 []  - New Admissions / Manufacturing engineernsurance Authorizations / Ordering NPWT, Apligraf, etc. 0 []  - Emergency Hospital Admission (emergent condition) 0 X - Simple Discharge Coordination 1 10 []  - Complex (extensive) Discharge Coordination 0 PROCESS - Special Needs []  - Pediatric / Minor Patient Management 0 []  - Isolation Patient Management 0 []  - Hearing / Language / Visual special needs 0 []  - Assessment of Community assistance (transportation, D/C planning, etc.) 0 []  - Additional assistance / Altered  mentation 0 []  - Support Surface(s) Assessment (bed, cushion, seat, etc.) 0 INTERVENTIONS - Wound Cleansing / Measurement X - Simple Wound Cleansing - one wound 1 5 []  -  Complex Wound Cleansing - multiple wounds 0 X - Wound Imaging (photographs - any number of wounds) 1 5 []  - Wound Tracing (instead of photographs) 0 X - Simple Wound Measurement - one wound 1 5 []  - Complex Wound Measurement - multiple wounds 0 INTERVENTIONS - Wound Dressings X - Small Wound Dressing one or multiple wounds 1 10 []  - Medium Wound Dressing one or multiple wounds 0 []  - Large Wound Dressing one or multiple wounds 0 []  - Application of Medications - topical 0 []  - Application of Medications - injection 0 INTERVENTIONS - Miscellaneous []  - External ear exam 0 []  - Specimen Collection (cultures, biopsies, blood, body fluids, etc.) 0 []  - Specimen(s) / Culture(s) sent or taken to Lab for analysis 0 []  - Patient Transfer (multiple staff / / Similar devices) 0 []  - Simple Staple / Suture removal (25 or less) 0 []  - Complex Staple / Suture removal (26 or more) 0 []  - Hypo / Hyperglycemic Management (close monitor of Blood Glucose) 0 []  - Ankle / Brachial Index (ABI) - do not check if billed separately 0 X - Vital Signs 1 5 Has the patient been seen at the hospital within the last three years: Yes Total Score: 120 Level Of Care: New/Established - Level 4 Electronic Signature(s) Signed: 07/10/2019 6:15:18 PM By: Entered By: on 07/10/2019 08:23:43 -------------------------------------------------------------------------------- Encounter Discharge Information Details Patient Name: Date of Service: [name], [name] 07/10/2019 7:30 AM Medical Record Patient Account Number: Date of Birth/Sex: Treating RN: Mar 01, 1976 (43 y.o. Primary Care Katy Brickell: , [name] Other Clinician: Referring Dandre Sisler: Treating  Juliyah Mergen/Extender:Madduri, , [name] Weeks in Treatment: 11 Encounter Discharge Information Items Discharge Condition: Stable Ambulatory Status: Ambulatory Discharge Destination: Home Transportation: Private Auto Accompanied By: self Schedule Follow-up Appointment: Yes Clinical Summary of Care: Notes foam bordered for padding and protection to left anterior lower leg. patient applied new dual layer compression stockings. Electronic Signature(s) Signed: 07/10/2019 6:15:18 PM By: 09/09/2019 Entered By: Shawn Stall on 07/10/2019 09:24:29 -------------------------------------------------------------------------------- Lower Extremity Assessment Details Patient Name: Date of Service: Willman, [name] 07/10/2019 7:30 AM Medical Record 09/09/2019 Patient Account Number: [id number] Date of Birth/Sex: Treating RN: Feb 16, 1976 (43 y.o. 58) [name] Primary Care Dhanvin Szeto: [name], [name] Other Clinician: Referring Daaiel Starlin: Treating Kyelle Urbas/Extender:Madduri, [name] Richards, [name] Weeks in Treatment: 11 Edema Assessment Assessed: [Left: No] [Right: No] Edema: [Left: Ye] [Right: s] Calf Left: Right: Point of Measurement: 40 cm From Medial Instep 43 cm cm Ankle Left: Right: Point of Measurement: 12 cm From Medial Instep 25 cm cm Electronic Signature(s) Signed: 10/07/2019 3:04:22 PM By: Shawn Stall RN Entered By: Shawn Stall on 07/10/2019 07:57:05 -------------------------------------------------------------------------------- Multi-Disciplinary Care Plan Details Patient Name: Date of Service: Chord, [name] 07/10/2019 7:30 AM Medical Record [id number] Patient Account Number: [id number] Date of Birth/Sex: Treating RN: 08-18-1976 (43 y.o. [name] Primary Care Donaven Criswell: [name] Pax, [name] Other Clinician: Referring Erza Mothershead: Treating Lataya Varnell/Extender:Madduri, [name], [name] Weeks in Treatment: 11 Active Inactive Electronic Signature(s) Signed:  07/10/2019 6:15:18 PM By: 14/05/2019 Entered By: [name] Pax on 07/10/2019 08:19:40 -------------------------------------------------------------------------------- Pain Assessment Details Patient Name: Date of Service: Blayden, Conwell 07/10/2019 7:30 AM Medical Record 09/09/2019 Patient Account Number: [id number] Date of Birth/Sex: Treating RN: May 31, 1976 (43 y.o. 58) [name] Primary Care Akshat Minehart: [name], [name] Other Clinician: Referring Keisa Blow: Treating Taimur Fier/Extender:Madduri, [name] Richards, [name] Weeks in Treatment: 11 Active Problems Location of Pain Severity and Description of Pain Patient Has Paino No Site Locations Pain Management and Medication  Current Pain Management: Electronic Signature(s) Signed: 10/07/2019 3:04:22 PM By: Carlene Coria RN Entered By: Carlene Coria on 07/10/2019 07:55:05 -------------------------------------------------------------------------------- Patient/Caregiver Education Details Patient Name: Date of Service: [name], [name] 9/10/2020andnbsp7:30 AM Medical Record [id number] Patient Account Number: [id number] Date of Birth/Gender: May 20, 1976 (43 y.o. M) Treating RN: Deon Pilling Primary Care Physician: [name], [name] Other Clinician: Referring Physician: Treating Physician/Extender:Madduri, Gladis Riffle, [name] Weeks in Treatment: 11 Education Assessment Education Provided To: Patient Education Topics Provided Wound/Skin Impairment: Handouts: Caring for Your Ulcer Methods: Explain/Verbal Responses: Reinforcements needed Electronic Signature(s) Signed: 07/10/2019 6:15:18 PM By: Deon Pilling Entered By: Deon Pilling on 07/10/2019 08:19:59 -------------------------------------------------------------------------------- Wound Assessment Details Patient Name: Date of Service: [place], [name] 07/10/2019 7:30 AM Medical Record [id number] Patient Account Number: [id number] Date of Birth/Sex: Treating RN: [birth year]  (43 y.o. [name]) Carlene Coria Primary Care Halen Mossbarger: [name], [name] Other Clinician: Referring Gracy Ehly: Treating Joseluis Alessio/Extender:Madduri, Gladis Riffle, [name] Weeks in Treatment: 11 Wound Status Wound Number: [id number] Primary Venous Leg Ulcer Etiology: Wound Location: Left Lower Leg - Anterior Wound Open Wounding Event: Gradually Appeared Status: Date Acquired: 03/31/2019 Comorbid Asthma, Sleep Apnea, Deep Vein Weeks Of Treatment: 11 History: Thrombosis, Phlebitis, Osteoarthritis, Clustered Wound: No Neuropathy Wound Measurements Length: (cm) 0 % Reduct Width: (cm) 0 % Reduct Depth: (cm) 0 Epitheli Area: (cm) 0 Tunneli Volume: (cm) 0 Undermi Wound Description Full Thickness Without Exposed Support Foul Odo Classification: Structures Slough/F Wound Flat and Intact Margin: Exudate Small Amount: Exudate Serous Type: Exudate amber Color: Wound Bed Granulation Amount: None Present (0%) Necrotic Amount: None Present (0%) Fascia E Fat Laye Tendon E Muscle E Joint Ex Bone Exp r After Cleansing: No ibrino No Exposed Structure xposed: No r (Subcutaneous Tissue) Exposed: No xposed: No xposed: No posed: No osed: No ion in Area: 100% ion in Volume: 100% alization: Large (67-100%) ng: No ning: No Electronic Signature(s) Signed: 10/07/2019 3:04:22 PM By: Carlene Coria RN Entered By: Carlene Coria on 07/10/2019 07:58:29 -------------------------------------------------------------------------------- Vitals Details Patient Name: Date of Service: Dreux, Mcgroarty 07/10/2019 7:30 AM Medical Record [id number] Patient Account Number: [id number] Date of Birth/Sex: Treating RN: Jan 01, 1976 (43 y.o. [name]) Carlene Coria Primary Care Nixon Kolton: [name], [name] Other Clinician: Referring Horatio Bertz: Treating Erilyn Pearman/Extender:Madduri, Gladis Riffle, [name] Weeks in Treatment: 11 Vital Signs Time Taken: 07:53 Temperature (F): 98.2 Height (in): 74 Pulse (bpm): 80 Weight (lbs):  325 Respiratory Rate (breaths/min): 18 Body Mass Index (BMI): 41.7 Blood Pressure (mmHg): 167/90 Reference Range: 80 - 120 mg / dl Electronic Signature(s) Signed: 10/07/2019 3:04:22 PM By: Carlene Coria RN Entered By: Carlene Coria on 07/10/2019 07:54:43

## 2019-10-07 NOTE — Progress Notes (Signed)
[name], Orlin ([id number]) Visit Report for 08/12/2019 Arrival Information Details Patient Name: Date of Service: [name], [name] 08/12/2019 7:30 AM Medical Record [id number] Patient Account Number: [id number] Date of Birth/Sex: Treating RN: 02-11-1976 (43 y.o. [name] RightM) Dwiggins, Shannon Primary Care Eulamae Greenstein: Docia ChuckKoirala, [name] Other Clinician: Referring Bengie Kaucher: Treating Rayna Brenner/Extender:[name], Effie ShyMichael Koirala, [name] in Treatment: 15 Visit Information History Since Last Visit Added or deleted any medications: No Patient Arrived: Ambulatory Any new allergies or adverse reactions: No Arrival Time: 07:55 Had a fall or experienced change in No Accompanied By: self activities of daily living that may affect Transfer Assistance: None risk of falls: Patient Identification Verified: Yes Signs or symptoms of abuse/neglect since last No Secondary Verification Process Completed: Yes visito Patient Requires Transmission-Based No Hospitalized since last visit: No Precautions: Implantable device outside of the clinic excluding No Patient Has Alerts: No cellular tissue based products placed in the center since last visit: Has Dressing in Place as Prescribed: Yes Has Compression in Place as Prescribed: Yes Pain Present Now: No Electronic Signature(s) Signed: 08/13/2019 5:37:20 PM By: Cherylin Mylarwiggins, Shannon Entered By: Cherylin Mylarwiggins, Shannon on 08/12/2019 07:55:41 -------------------------------------------------------------------------------- Compression Therapy Details Patient Name: Date of Service: [name], [name] 08/12/2019 7:30 AM Medical Record [id number] Patient Account Number: [id number] Date of Birth/Sex: Treating RN: 02-11-1976 (43 y.o. Melonie FloridaM) Epps, Carrie Primary Care Brody Bonneau: Docia ChuckKoirala, [name] Other Clinician: Referring Yoshimi Sarr: Treating Dimitria Ketchum/Extender:[name], Effie ShyMichael Koirala, [name] in Treatment: 15 Compression Therapy Performed for Wound Wound #11R  Left,Proximal,Anterior Lower Leg Assessment: Performed By: Little Ishikawalinician Epps, Carrie, RN Compression Type: Four Layer Post Procedure Diagnosis Same as Pre-procedure Electronic Signature(s) Signed: 10/07/2019 3:01:15 PM By: Yevonne PaxEpps, Carrie RN Entered By: Yevonne PaxEpps, Carrie on 08/12/2019 08:35:45 -------------------------------------------------------------------------------- Compression Therapy Details Patient Name: Date of Service: [name], [name] 08/12/2019 7:30 AM Medical Record [id number] Patient Account Number: [id number] Date of Birth/Sex: Treating RN: 02-11-1976 (43 y.o. Melonie FloridaM) Epps, Carrie Primary Care Guled Gahan: Docia ChuckKoirala, [name] Other Clinician: Referring Richel Millspaugh: Treating Chaunice Obie/Extender:[name], Effie ShyMichael Koirala, [name] in Treatment: 15 Compression Therapy Performed for Wound Wound #9RR Left,Anterior Lower Leg Assessment: Performed By: Little Ishikawalinician Epps, Carrie, RN Compression Type: Four Layer Post Procedure Diagnosis Same as Pre-procedure Electronic Signature(s) Signed: 10/07/2019 3:01:15 PM By: Yevonne PaxEpps, Carrie RN Entered By: Yevonne PaxEpps, Carrie on 08/12/2019 08:35:45 -------------------------------------------------------------------------------- Encounter Discharge Information Details Patient Name: Date of Service: [name], [name] 08/12/2019 7:30 AM Medical Record [id number] Patient Account Number: [id number] Date of Birth/Sex: Treating RN: 02-11-1976 (43 y.o. Elizebeth KollerM) Lynch, Shatara Primary Care Lucius Wise: Docia ChuckKoirala, [name] Other Clinician: Referring Allia Wiltsey: Treating Teanna Elem/Extender:[name], Effie ShyMichael Koirala, [name] in Treatment: 15 Encounter Discharge Information Items Discharge Condition: Stable Ambulatory Status: Ambulatory Discharge Destination: Home Transportation: Private Auto Accompanied By: Self Schedule Follow-up Appointment: Yes Clinical Summary of Care: Electronic Signature(s) Signed: 08/12/2019 5:52:09 PM By: Zandra AbtsLynch, Shatara RN, BSN Entered By: Zandra AbtsLynch, Shatara on  08/12/2019 09:12:02 -------------------------------------------------------------------------------- Lower Extremity Assessment Details Patient Name: Date of Service: [name], [name] 08/12/2019 7:30 AM Medical Record [id number] Patient Account Number: [id number] Date of Birth/Sex: Treating RN: 02-11-1976 (43 y.o. [name] RightM) Dwiggins, Shannon Primary Care Quanisha Drewry: Docia ChuckKoirala, [name] Other Clinician: Referring Aleighya Mcanelly: Treating Damari Hiltz/Extender:[name], Effie ShyMichael Koirala, [name] in Treatment: 15 Edema Assessment Assessed: [Left: No] [Right: No] Edema: [Left: Ye] [Right: s] Calf Left: Right: Point of Measurement: 35 cm From Medial Instep 44 cm cm Ankle Left: Right: Point of Measurement: 11.5 cm From Medial Instep 25 cm cm Vascular Assessment Pulses: Dorsalis Pedis Palpable: [Left:Yes] Electronic Signature(s) Signed: 08/13/2019 5:37:20 PM By: Cherylin Mylarwiggins, Shannon Entered By: Cherylin Mylarwiggins, Shannon on 08/12/2019 08:03:13 -------------------------------------------------------------------------------- Multi Wound Chart Details Patient Name: Date of Service:  [name], [name] 08/12/2019 7:30 AM Medical Record [id number] Patient Account Number: [id number] Date of Birth/Sex: Treating RN: [birth year] (43 y.o. [name]) Carlene Coria Primary Care Aracelly Tencza: [name], [name] Other Clinician: Referring Tylee Newby: Treating Hiawatha Dressel/Extender:[name], [name], [name] in Treatment: 15 Vital Signs Height(in): 74 Pulse(bpm): 86 Weight(lbs): 325 Blood Pressure(mmHg): 173/85 Body Mass Index(BMI): 42 Temperature(F): 98.7 Respiratory 18 Rate(breaths/min): Photos: [11R:No Photos] [9RR:No Photos] [N/A:N/A] Wound Location: [11R:Left Lower Leg - Anterior, Left Lower Leg - Anterior Proximal] [N/A:N/A] Wounding Event: [11R:Gradually Appeared] [9RR:Gradually Appeared] [N/A:N/A] Primary Etiology: [11R:Venous Leg Ulcer] [9RR:Venous Leg Ulcer] [N/A:N/A] Comorbid History: [11R:Asthma, Sleep Apnea,  DeepAsthma, Sleep Apnea, DeepN/A Vein Thrombosis, Phlebitis, Vein Thrombosis, Phlebitis, Osteoarthritis, Neuropathy Osteoarthritis, Neuropathy] Date Acquired: [11R:05/22/2019] [9RR:03/31/2019] [N/A:N/A] Weeks of Treatment: [11R:11] [9RR:15] [N/A:N/A] Wound Status: [11R:Open] [9RR:Open] [N/A:N/A] Wound Recurrence: [11R:Yes] [9RR:Yes] [N/A:N/A] Clustered Wound: [11R:Yes] [9RR:No] [N/A:N/A] Clustered Quantity: [11R:4] [9RR:N/A] [N/A:N/A] Measurements L x W x D 0.5x3x0.1 [9RR:3.3x1.8x0.1] [N/A:N/A] (cm) Area (cm) : [11R:1.178] [2ZH:0.865] [N/A:N/A] Volume (cm) : [11R:0.118] [9RR:0.467] [N/A:N/A] % Reduction in Area: [11R:-501.00%] [9RR:64.60%] [N/A:N/A] % Reduction in Volume: -490.00% [9RR:64.60%] [N/A:N/A] Classification: [11R:Full Thickness Without Exposed Support Structures Exposed Support Structures] [9RR:Full Thickness Without] [N/A:N/A] Exudate Amount: [11R:Medium] [9RR:Medium] [N/A:N/A] Exudate Type: [11R:Serosanguineous] [9RR:Serosanguineous] [N/A:N/A] Exudate Color: [11R:red, brown] [9RR:red, brown] [N/A:N/A] Wound Margin: [11R:Flat and Intact] [9RR:Flat and Intact] [N/A:N/A] Granulation Amount: [11R:None Present (0%)] [9RR:Large (67-100%)] [N/A:N/A] Granulation Quality: [11R:N/A] [9RR:Red, Pink] [N/A:N/A] Necrotic Amount: [11R:Small (1-33%)] [9RR:Small (1-33%)] [N/A:N/A] Necrotic Tissue: [11R:Eschar] [9RR:Adherent Slough] [N/A:N/A] Exposed Structures: [11R:Fascia: No Fat Layer (Subcutaneous Tissue) Exposed: Yes Tissue) Exposed: No Tendon: No Muscle: No Joint: No Bone: No] [9RR:Fat Layer (Subcutaneous N/A Fascia: No Tendon: No Muscle: No Joint: No Bone: No] Epithelialization: [11R:Large (67-100%)] [9RR:Small (1-33%) Compression Therapy] [N/A:N/A N/A] Treatment Notes Electronic Signature(s) Signed: 08/12/2019 5:42:33 PM By: Linton Ham MD Signed: 10/07/2019 3:01:15 PM By: Carlene Coria RN Entered By: Linton Ham on 08/12/2019  08:58:09 -------------------------------------------------------------------------------- Multi-Disciplinary Care Plan Details Patient Name: Date of Service: [name], [name] 08/12/2019 7:30 AM Medical Record [id number] Patient Account Number: [id number] Date of Birth/Sex: Treating RN: 1976-08-10 (43 y.o. Oval Linsey Primary Care Kimyah Frein: [name], [name] Other Clinician: Referring Ambyr Qadri: Treating Helma Argyle/Extender:[name], [name], [name] in Treatment: 15 Active Inactive Wound/Skin Impairment Nursing Diagnoses: Knowledge deficit related to ulceration/compromised skin integrity Goals: Patient/caregiver will verbalize understanding of skin care regimen Date Initiated: 07/22/2019 Target Resolution Date: 08/22/2019 Goal Status: Active Ulcer/skin breakdown will have a volume reduction of 30% by week 4 Date Initiated: 07/22/2019 Target Resolution Date: 08/22/2019 Goal Status: Active Interventions: Assess patient/caregiver ability to obtain necessary supplies Assess patient/caregiver ability to perform ulcer/skin care regimen upon admission and as needed Assess ulceration(s) every visit Notes: Electronic Signature(s) Signed: 10/07/2019 3:01:15 PM By: Carlene Coria RN Entered By: Carlene Coria on 08/12/2019 08:09:28 -------------------------------------------------------------------------------- Pain Assessment Details Patient Name: Date of Service: [name], [name] 08/12/2019 7:30 AM Medical Record [id number] Patient Account Number: [id number] Date of Birth/Sex: Treating RN: 03-Mar-1976 (43 y.o. [name] Primary Care Thaddus Mcdowell: [name], [name] Other Clinician: Referring Zakiyyah Savannah: Treating Mohamadou Maciver/Extender:[name], [name], [name] in Treatment: 15 Active Problems Location of Pain Severity and Description of Pain Patient Has Paino No Site Locations Pain Management and Medication Current Pain Management: Electronic  Signature(s) Signed: 08/13/2019 5:37:20 PM By: Kela Millin Entered By: Kela Millin on 08/12/2019 08:02:44 -------------------------------------------------------------------------------- Patient/Caregiver Education Details Patient Name: Date of Service: [name] 10/13/2020andnbsp7:30 AM Medical Record [id number] Patient Account Number: [id number] Date of Birth/Gender: May 27, 1976 (43 y.o. M) Treating RN: Carlene Coria Primary Care Physician: [name], [name] Other Clinician: Referring  Physician: Treating Physician/Extender:[name], [name], [name] in Treatment: 15 Education Assessment Education Provided To: Patient Education Topics Provided Wound/Skin Impairment: Methods: Explain/Verbal Responses: See progress note Electronic Signature(s) Signed: 10/07/2019 3:01:15 PM By: [name] RN Entered By: [name] on 08/12/2019 08:09:57 -------------------------------------------------------------------------------- Wound Assessment Details Patient Name: Date of Service: [name], [name] 08/12/2019 7:30 AM Medical Record [id number] Patient Account Number: [id number] Date of Birth/Sex: Treating RN: 04-03-1976 (43 y.o. [name] Primary Care Crisanto Nied: [name], [name] Other Clinician: Referring Quanna Wittke: Treating Lekesha Claw/Extender:[name], [name], [name] in Treatment: 15 Wound Status Wound Number: [id number] Primary Venous Leg Ulcer Etiology: Wound Location: Left Lower Leg - Anterior, Proximal Wound Open Wounding Event: Gradually Appeared Status: Date Acquired: 05/22/2019 Comorbid Asthma, Sleep Apnea, Deep Vein Weeks Of Treatment: 11 History: Thrombosis, Phlebitis, Osteoarthritis, Clustered Wound: Yes Neuropathy Photos Wound Measurements Length: (cm) 0.5 % Reduc Width: (cm) 3 % Reduc Depth: (cm) 0.1 Epithel Clustered Quantity: 4 Tunneli Area: (cm) 1.178 Underm Volume: (cm) 0.118 Wound Description Classification:  Full Thickness Without Exposed Support Structures Wound Flat and Intact Margin: Exudate Medium Amount: Exudate Serosanguineous Type: Exudate red, brown Color: Wound Bed Granulation Amount: None Present (0%) Necrotic Amount: Small (1-33%) Necrotic Quality: Eschar Foul Odor After Cleansing: No Slough/Fibrino Yes Exposed Structure Fascia Exposed: No Fat Layer (Subcutaneous Tissue) Exposed: No Tendon Exposed: No Muscle Exposed: No Joint Exposed: No Bone Exposed: No tion in Area: -501% tion in Volume: -490% ialization: Large (67-100%) ng: No ining: No Electronic Signature(s) Signed: 08/13/2019 5:37:20 PM By: [name] Signed: 09/03/2019 4:01:42 PM By: Benjaman Kindler EMT/HBOT Entered By: Benjaman Kindler on 08/13/2019 08:24:38 -------------------------------------------------------------------------------- Wound Assessment Details Patient Name: Date of Service: [name], [name] 08/12/2019 7:30 AM Medical Record [id number] Patient Account Number: [id number] Date of Birth/Sex: Treating RN: 23-Jul-1976 (43 y.o. [name] Primary Care Raylan Hanton: [name], [name] Other Clinician: Referring Crystalle Popwell: Treating Yarissa Reining/Extender:[name], [name], [name] in Treatment: 15 Wound Status Wound Number: [id number] Primary Venous Leg Ulcer Etiology: Wound Location: Left Lower Leg - Anterior Wound Open Wounding Event: Gradually Appeared Status: Date Acquired: 03/31/2019 Comorbid Asthma, Sleep Apnea, Deep Vein Thrombosis, Weeks Of Treatment: 15 History: Phlebitis, Osteoarthritis, Neuropathy Clustered Wound: No Photos Wound Measurements Length: (cm) 3.3 % Reduct Width: (cm) 1.8 % Reduct Depth: (cm) 0.1 Epitheli Area: (cm) 4.665 Tunneli Volume: (cm) 0.467 Undermi Wound Description Classification: Full Thickness Without Exposed Support Foul Od Structures Slough/ Wound Flat and Intact Margin: Exudate Medium Amount: Exudate  Serosanguineous Type: Exudate red, brown Color: Wound Bed Granulation Amount: Large (67-100%) Granulation Quality: Red, Pink Fascia Exp Necrotic Amount: Small (1-33%) Fat Layer Necrotic Quality: Adherent Slough Tendon Exp Muscle Exp Joint Expo Bone Expos or After Cleansing: No Fibrino Yes Exposed Structure osed: No (Subcutaneous Tissue) Exposed: Yes osed: No osed: No sed: No ed: No ion in Area: 64.6% ion in Volume: 64.6% alization: Small (1-33%) ng: No ning: No Electronic Signature(s) Signed: 08/13/2019 5:37:20 PM By: [name] Signed: 09/03/2019 4:01:42 PM By: Benjaman Kindler EMT/HBOT Entered By: Benjaman Kindler on 08/13/2019 08:24:21 -------------------------------------------------------------------------------- Vitals Details Patient Name: Date of Service: [name], [name] 08/12/2019 7:30 AM Medical Record [id number] Patient Account Number: [id number] Date of Birth/Sex: Treating RN: Sep 02, 1976 (43 y.o. [name] Primary Care Prateek Knipple: [name], [name] Other Clinician: Referring Umberto Pavek: Treating Johndavid Geralds/Extender:[name], [name], [name] in Treatment: 15 Vital Signs Time Taken: 07:57 Temperature (F): 98.7 Height (in): 74 Pulse (bpm): 86 Weight (lbs): 325 Respiratory Rate (breaths/min): 18 Body Mass Index (BMI): 41.7 Blood Pressure (mmHg): 173/85 Reference Range: 80 - 120 mg /  dl Electronic Signature(s) Signed: 08/13/2019 5:37:20 PM By: [name] Entered By: [name] on 08/12/2019 07:58:34

## 2019-10-07 NOTE — Progress Notes (Signed)
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 length x 0.2cm width x 0.1cm depth; 0.047cm^2 area and 0.005cm^3 volume. There is Fat Layer (Subcutaneous Tissue) Exposed exposed. There is no tunneling or undermining noted. There is a small amount of serosanguineous drainage noted. The wound margin is distinct with the outline attached to the wound base. There is large (67-100%) pink granulation within the wound bed. There is no necrotic tissue within the wound bed. Wound #9RR status is Open. Original cause of wound was Gradually Appeared. The wound is located  on the Left,Anterior Lower Leg. The wound measures 2.6cm length x 0.7cm width x 0.2cm depth; 1.429cm^2 area and 0.286cm^3 volume. There is Fat Layer (Subcutaneous Tissue) Exposed exposed. There is no tunneling or undermining noted. There is a medium amount of serous drainage noted. The wound margin is flat and intact. There is large (67-100%) red, pink granulation within the wound bed. There is no necrotic tissue within the wound bed. Assessment Active Problems ICD-10 Non-pressure chronic ulcer of other part  of left lower leg limited to breakdown of skin Chronic venous hypertension (idiopathic) with inflammation of left lower extremity Non-pressure chronic ulcer of other part of left foot with other specified severity Lymphedema, not elsewhere classified Other thrombophilia Procedures Wound #9RR Pre-procedure diagnosis of Wound #9RR is a Venous Leg Ulcer located on the Left,Anterior Lower Leg .Severity of Tissue Pre Debridement is: Fat layer exposed. There was a Excisional Skin/Subcutaneous Tissue Debridement with a total area of 1.82 sq cm performed by Maxwell Caul., MD. With the following instrument(s): Curette to remove Viable and Non-Viable tissue/material. Material removed includes Subcutaneous Tissue and Slough and after achieving pain control using Lidocaine 5% topical ointment. No specimens were taken. A time out was conducted at 10:03, prior to the start of the procedure. A Moderate amount of bleeding was controlled with Pressure. The procedure was tolerated well with a pain level of 4 throughout and a pain level of 0 following the procedure. Post Debridement Measurements: 2.6cm length x 0.7cm width x 0.2cm depth; 0.286cm^3 volume. Character of Wound/Ulcer Post Debridement is improved. Severity of Tissue Post Debridement is: Fat layer exposed. Post procedure Diagnosis Wound #9RR: Same as Pre-Procedure Pre-procedure diagnosis of Wound #9RR is a Venous Leg Ulcer located on the  Left,Anterior Lower Leg . There was a Four Layer Compression Therapy Procedure by [name], RN. Post procedure Diagnosis Wound #9RR: Same as Pre-Procedure Wound #12 Pre-procedure diagnosis of Wound #12 is a Venous Leg Ulcer located on the Left,Lateral Foot . There was a Four Layer Compression Therapy Procedure by [name], RN. Post procedure Diagnosis Wound #12: Same as Pre-Procedure Plan Dressing Change Frequency: Wound #9RR Left,Anterior Lower Leg: Do not change entire dressing for one week. Skin Barriers/Peri-Wound Care: TCA Cream or Ointment Wound Cleansing: May shower with protection. Primary Wound Dressing: Wound #12 Left,Lateral Foot: Iodoflex - pad area with foam Wound #9RR Left,Anterior Lower Leg: Other: - sorbact Secondary Dressing: Dry Gauze ABD pad Wound #12 Left,Lateral Foot: Foam Edema Control: 4 layer compression: Left lower extremity Avoid standing for long periods of time Elevate legs to the level of the heart or above for 30 minutes daily and/or when sitting, a frequency of: Support Garment 30-40 mm/Hg pressure to: - right leg : apply compression stockings dual layer in the am and remove at night. Segmental Compressive Device. - once insurance approves use lymphedema pumps an hour each day. 1. I change the primary dressing to the major wound in the mid tibia to Sorbact hydrogel. Again this appears to be a surface issue 2. I put Iodoflex Iodosorb ointment on the small areas on the left lateral foot. These are top tiny small open areas you cannot even see the bottom of 3. I wonder if Dr. Trula Ore will be able to do anything about the venous obstruction. If not I am not optimistic about maintaining skin integrity Electronic Signature(s) Signed: 09/09/2019 5:17:49 PM By: Baltazar Najjar MD Entered By: Baltazar Najjar on 09/09/2019 11:07:02 -------------------------------------------------------------------------------- SuperBill Details Patient  Name: Date of Service: [name], [name] 09/09/2019 Medical Record [id number] Patient Account Number: [id number] Date of Birth/Sex: Treating RN: [birth year] (43 y.o. [name] Primary Care Provider: [name], [name] Other Clinician: Referring Provider: Treating Provider/Extender:[name], [name], [name] in Treatment: 19 Diagnosis Coding ICD-10 Codes Code Description L97.821 Non-pressure chronic ulcer of other part of left lower leg limited to breakdown of skin I87.322 Chronic venous hypertension (idiopathic) with inflammation of left lower extremity L97.528 Non-pressure chronic ulcer of other part of left foot with other specified  severity I89.0 Lymphedema, not elsewhere classified D68.69 Other thrombophilia Facility Procedures CPT4 Code Description: [id number] 11042 - DEB SUBQ TISSUE 20 SQ CM/< ICD-10 Diagnosis Description L97.821 Non-pressure chronic ulcer of other part of left lower leg skin Modifier: limited to breakdo Quantity: 1 wn of Physician Procedures CPT4 Code Description: [id number] 11042 - WC PHYS SUBQ TISS 20 SQ CM ICD-10 Diagnosis Description L97.821 Non-pressure chronic ulcer of other part of left lower leg skin Modifier: limited to breakdo Quantity: 1 wn of Electronic Signature(s) Signed: 09/09/2019 5:17:49 PM By: Baltazar Najjar MD Entered By: Baltazar Najjar on 09/09/2019 11:08:20
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
## 2019-10-07 NOTE — Progress Notes (Signed)
[name], [name] ([id number]) Visit Report for 08/19/2019 Arrival Information Details Patient Name: Date of Service: [name], [name] 08/19/2019 7:30 AM Medical Record [id number] Patient Account Number: [id number] Date of Birth/Sex: Treating RN: 06-14-1976 (43 y.o. [name] Primary Care [name]: [name], [name] Other Clinician: Referring Koya Hunger: Treating Imir Brumbach/Extender:[name], [name], [name] in Treatment: 16 Visit Information History Since Last Visit Added or deleted any medications: No Patient Arrived: Ambulatory Any new allergies or adverse reactions: No Arrival Time: 07:42 Had a fall or experienced change in No Accompanied By: self activities of daily living that may affect Transfer Assistance: None risk of falls: Patient Requires Transmission-Based No Signs or symptoms of abuse/neglect since last No Precautions: visito Patient Has Alerts: No Hospitalized since last visit: No Implantable device outside of the clinic excluding No cellular tissue based products placed in the center since last visit: Has Dressing in Place as Prescribed: Yes Has Compression in Place as Prescribed: Yes Pain Present Now: No Electronic Signature(s) Signed: 08/19/2019 6:16:57 PM By: [name] Entered By: [name] on 08/19/2019 07:44:13 -------------------------------------------------------------------------------- Compression Therapy Details Patient Name: Date of Service: [name], [name] 08/19/2019 7:30 AM Medical Record [id number] Patient Account Number: [id number] Date of Birth/Sex: Treating RN: Jun 10, 1976 (43 y.o. [name]) [name] Primary Care Alizza Sacra: [name], [name] Other Clinician: Referring Trip Cavanagh: Treating Malikah Lakey/Extender:[name], [name], [name] in Treatment: 16 Compression Therapy Performed for Wound Wound #11R Left,Proximal,Anterior Lower Leg Assessment: Performed By: [name] Ishikawa, RN Compression  Type: Four Layer Post Procedure Diagnosis Same as Pre-procedure Electronic Signature(s) Signed: 10/07/2019 3:01:15 PM By: [name] Pax RN Entered By: [name] on 08/19/2019 08:16:12 -------------------------------------------------------------------------------- Compression Therapy Details Patient Name: Date of Service: [name], [name] 08/19/2019 7:30 AM Medical Record [id number] Patient Account Number: [id number] Date of Birth/Sex: Treating RN: May 18, 1976 (43 y.o. [name] Primary Care Artrice Kraker: [name], [name] Other Clinician: Referring Jolayne Branson: Treating Coren Sagan/Extender:[name], [name], [name] in Treatment: 16 Compression Therapy Performed for Wound Wound #9RR Left,Anterior Lower Leg Assessment: Performed By: [name] Ishikawa, RN Compression Type: Four Layer Post Procedure Diagnosis Same as Pre-procedure Electronic Signature(s) Signed: 10/07/2019 3:01:15 PM By: [name] Pax RN Entered By: [name] on 08/19/2019 08:16:12 -------------------------------------------------------------------------------- Encounter Discharge Information Details Patient Name: Date of Service: [name], [name] 08/19/2019 7:30 AM Medical Record [id number] Patient Account Number: [id number] Date of Birth/Sex: Treating RN: [birth year] (42 y.o. [name] Primary Care Keisha Amer: [name], [name] Other Clinician: Referring Haylen Bellotti: Treating Audreana Hancox/Extender:[name], [name], [name] in Treatment: 16 Encounter Discharge Information Items Discharge Condition: Stable Ambulatory Status: Ambulatory Discharge Destination: Home Transportation: Private Auto Accompanied By: alone Schedule Follow-up Appointment: Yes Clinical Summary of Care: Patient Declined Electronic Signature(s) Signed: 08/20/2019 6:50:57 PM By: Zandra Abts RN, BSN Entered By: Zandra Abts on 08/19/2019  12:04:09 -------------------------------------------------------------------------------- Lower Extremity Assessment Details Patient Name: Date of Service: [name], [name] 08/19/2019 7:30 AM Medical Record [id number] Patient Account Number: [id number] Date of Birth/Sex: Treating RN: September 17, 1976 (43 y.o. [name] Primary Care Sol Englert: [name], [name] Other Clinician: Referring Sianne Tejada: Treating Krystian Younglove/Extender:[name], [name], [name] in Treatment: 16 Edema Assessment Assessed: [Left: No] [[name]: No] Edema: [Left: Ye] [[name]: s] Calf Left: [name]: Point of Measurement: 35 cm From Medial Instep 39 cm cm Ankle Left: [name]: Point of Measurement: 11.5 cm From Medial Instep 22 cm cm Vascular Assessment Pulses: Dorsalis Pedis Palpable: [Left:Yes] Electronic Signature(s) Signed: 08/19/2019 6:16:57 PM By: [name] Entered By: [name] on 08/19/2019 07:55:14 -------------------------------------------------------------------------------- Multi Wound Chart Details Patient Name: Date of Service: [name], [name] 08/19/2019 7:30 AM Medical Record  [id number] Patient Account Number: [id number] Date of Birth/Sex: Treating RN: 1976-07-04 (43 y.o. [name]) [name] Primary Care Cyndy Braver: [name], [name] Other Clinician: Referring Jessica Seidman: Treating Chisom Aust/Extender:[name], [name], [name] in Treatment: 16 Vital Signs Height(in): 74 Pulse(bpm): 89 Weight(lbs): 325 Blood Pressure(mmHg): 135/85 Body Mass Index(BMI): 42 Temperature(F): 98.6 Respiratory 18 Rate(breaths/min): Photos: [11R:No Photos] [9RR:No Photos] [N/A:N/A] Wound Location: [11R:Left Lower Leg - Anterior, Left Lower Leg - Anterior Proximal] [N/A:N/A] Wounding Event: [11R:Gradually Appeared] [9RR:Gradually Appeared] [N/A:N/A] Primary Etiology: [11R:Venous Leg Ulcer] [9RR:Venous Leg Ulcer] [N/A:N/A] Comorbid History: [11R:Asthma, Sleep Apnea, DeepAsthma, Sleep  Apnea, DeepN/A Vein Thrombosis, Phlebitis, Vein Thrombosis, Phlebitis, Osteoarthritis, Neuropathy Osteoarthritis, Neuropathy] Date Acquired: [11R:05/22/2019] [9RR:03/31/2019] [N/A:N/A] Weeks of Treatment: [11R:12] [9RR:16] [N/A:N/A] Wound Status: [11R:Open] [9RR:Open] [N/A:N/A] Wound Recurrence: [11R:Yes] [9RR:Yes] [N/A:N/A] Clustered Wound: [11R:Yes] [9RR:No] [N/A:N/A] Clustered Quantity: [11R:4] [9RR:N/A] [N/A:N/A] Measurements L x W x D 0.5x3x0.1 [9RR:3.1x1x0.2] [N/A:N/A] (cm) Area (cm) : [11R:1.178] [9RR:2.435] [N/A:N/A] Volume (cm) : [11R:0.118] [9RR:0.487] [N/A:N/A] % Reduction in Area: [11R:-501.00%] [9RR:81.50%] [N/A:N/A] % Reduction in Volume: -490.00% [9RR:63.10%] [N/A:N/A] Classification: [11R:Full Thickness Without Exposed Support Structures Exposed Support Structures] [9RR:Full Thickness Without] [N/A:N/A] Exudate Amount: [11R:None Present] [9RR:Medium] [N/A:N/A] Exudate Type: [11R:N/A] [9RR:Serosanguineous] [N/A:N/A] Exudate Color: [11R:N/A] [9RR:red, brown] [N/A:N/A] Wound Margin: [11R:Flat and Intact] [9RR:Flat and Intact] [N/A:N/A] Granulation Amount: [11R:None Present (0%)] [9RR:Large (67-100%)] [N/A:N/A] Granulation Quality: [11R:N/A] [9RR:Red, Pink] [N/A:N/A] Necrotic Amount: [11R:Small (1-33%)] [9RR:None Present (0%)] [N/A:N/A] Necrotic Tissue: [11R:Eschar] [9RR:N/A] [N/A:N/A] Exposed Structures: [11R:Fascia: No Fat Layer (Subcutaneous Tissue) Exposed: Yes Tissue) Exposed: No Tendon: No Muscle: No Joint: No Bone: No] [9RR:Fat Layer (Subcutaneous N/A Fascia: No Tendon: No Muscle: No Joint: No Bone: No] Epithelialization: [11R:Large (67-100%)] [9RR:Small (1-33%) Compression Therapy] [N/A:N/A N/A] Treatment Notes Electronic Signature(s) Signed: 08/19/2019 6:17:56 PM By: Baltazar Najjar MD Signed: 10/07/2019 3:01:15 PM By: [name] Pax RN Entered By: Baltazar Najjar on 08/19/2019  08:32:33 -------------------------------------------------------------------------------- Multi-Disciplinary Care Plan Details Patient Name: Date of Service: [name], [name] 08/19/2019 7:30 AM Medical Record [id number] Patient Account Number: [id number] Date of Birth/Sex: Treating RN: September 28, 1976 (43 y.o. [name] Primary Care Rollande Thursby: [name], [name] Other Clinician: Referring Azeneth Carbonell: Treating Khayman Kirsch/Extender:[name], [name], [name] in Treatment: 16 Active Inactive Wound/Skin Impairment Nursing Diagnoses: Knowledge deficit related to ulceration/compromised skin integrity Goals: Patient/caregiver will verbalize understanding of skin care regimen Date Initiated: 07/22/2019 Target Resolution Date: 08/22/2019 Goal Status: Active Ulcer/skin breakdown will have a volume reduction of 30% by week 4 Date Initiated: 07/22/2019 Target Resolution Date: 08/22/2019 Goal Status: Active Interventions: Assess patient/caregiver ability to obtain necessary supplies Assess patient/caregiver ability to perform ulcer/skin care regimen upon admission and as needed Assess ulceration(s) every visit Notes: Electronic Signature(s) Signed: 10/07/2019 3:01:15 PM By: [name] Pax RN Entered By: [name] on 08/19/2019 08:09:27 -------------------------------------------------------------------------------- Pain Assessment Details Patient Name: Date of Service: [name], [name] 08/19/2019 7:30 AM Medical Record [id number] Patient Account Number: [id number] Date of Birth/Sex: Treating RN: [birth year] (43 y.o. [name] Primary Care Phuong Hillary: [name], [name] Other Clinician: Referring Brion Hedges: Treating Paysley Poplar/Extender:[name], [name], [name] in Treatment: 16 Active Problems Location of Pain Severity and Description of Pain Patient Has Paino No Site Locations Pain Management and Medication Current Pain Management: Electronic  Signature(s) Signed: 08/19/2019 6:16:57 PM By: [name] Entered By: [name] on 08/19/2019 07:49:55 -------------------------------------------------------------------------------- Patient/Caregiver Education Details Patient Name: Date of Service: [name] 10/20/2020andnbsp7:30 AM Medical Record [id number] Patient Account Number: [id number] Date of Birth/Gender: [birth year] (43 y.o. M) Treating RN: [name] Primary Care Physician: [name], [name] Other Clinician: Referring Physician: Treating Physician/Extender:[name], [name], [name] Tania Ade  in Treatment: 16 Education Assessment Education Provided To: Patient Education Topics Provided Wound/Skin Impairment: Methods: Explain/Verbal Responses: State content correctly Electronic Signature(s) Signed: 10/07/2019 3:01:15 PM By: Carlene Coria RN Entered By: Carlene Coria on 08/19/2019 08:09:42 -------------------------------------------------------------------------------- Wound Assessment Details Patient Name: Date of Service: [name], [name] 08/19/2019 7:30 AM Medical Record [id number] Patient Account Number: [id number] Date of Birth/Sex: Treating RN: 1976/07/16 (43 y.o. [name] Primary Care Latashia Koch: [name], [name] Other Clinician: Referring Bodhi Moradi: Treating Alaura Schippers/Extender:[name], [name], [name] in Treatment: 16 Wound Status Wound Number: [id number] Primary Venous Leg Ulcer Etiology: Wound Location: Left Lower Leg - Anterior, Proximal Wound Open Wounding Event: Gradually Appeared Status: Date Acquired: 05/22/2019 Comorbid Asthma, Sleep Apnea, Deep Vein Weeks Of Treatment: 12 History: Thrombosis, Phlebitis, Osteoarthritis, Clustered Wound: Yes Neuropathy Photos Wound Measurements Length: (cm) 0.5 % Reduc Width: (cm) 3 % Reduc Depth: (cm) 0.1 Epithel Clustered Quantity: 4 Tunneli Area: (cm) 1.178 Underm Volume: (cm) 0.118 Wound  Description Classification: Full Thickness Without Exposed Support Foul O Structures Slough Wound Flat and Intact Margin: Exudate None Present Amount: Wound Bed Granulation Amount: None Present (0%) Necrotic Amount: Small (1-33%) Fascia Necrotic Quality: Eschar Fat La Tendon Muscle Joint Bone E Electronic Signature(s) Signed: 08/21/2019 4:25:07 PM By: Mikeal Hawthorne EMT/HBOT Signed: 08/25/2019 5:21:50 PM By: Kela Millin Previous Signature: 08/19/2019 6:16:57 PM Version By: Michel Harrow Entered By: Mikeal Hawthorne on 10/21 dor After Cleansing: No Lynita Lombard Yes Exposed Structure Exposed: No yer (Subcutaneous Tissue) Exposed: No Exposed: No Exposed: No Exposed: No xposed: No hannon /2020 08:16:39 tion in Area: -501% tion in Volume: -490% ialization: Large (67-100%) ng: No ining: No -------------------------------------------------------------------------------- Wound Assessment Details Patient Name: Date of Service: [name], [name] 08/19/2019 7:30 AM Medical Record [id number] Patient Account Number: [id number] Date of Birth/Sex: Treating RN: Mar 29, 1976 (43 y.o. [name] Primary Care Gomer France: [name], [name] Other Clinician: Referring Rand Boller: Treating Nancy Arvin/Extender:[name], [name], [name] in Treatment: 16 Wound Status Wound Number: [id number] Primary Venous Leg Ulcer Etiology: Wound Location: Left Lower Leg - Anterior Wound Open Wounding Event: Gradually Appeared Status: Date Acquired: 03/31/2019 Comorbid Asthma, Sleep Apnea, Deep Vein Thrombosis, Weeks Of Treatment: 16 History: Phlebitis, Osteoarthritis, Neuropathy Clustered Wound: No Photos Wound Measurements Length: (cm) 3.1 % Reduct Width: (cm) 1 % Reduct Depth: (cm) 0.2 Epitheli Area: (cm) 2.435 Tunneli Volume: (cm) 0.487 Undermi Wound Description Full Thickness Without Exposed Support Foul Od Classification: Structures Slough/ Wound Flat and  Intact Margin: Exudate Medium Amount: Exudate Serosanguineous Type: Exudate red, brown Color: Wound Bed Granulation Amount: Large (67-100%) Granulation Quality: Red, Pink Fascia Exp Necrotic Amount: None Present (0%) Fat Layer Tendon Exp Muscle Exp Joint Expo Bone Expos or After Cleansing: No Fibrino No Exposed Structure osed: No (Subcutaneous Tissue) Exposed: Yes osed: No osed: No sed: No ed: No ion in Area: 81.5% ion in Volume: 63.1% alization: Small (1-33%) ng: No ning: No Electronic Signature(s) Signed: 08/21/2019 4:25:07 PM By: Mikeal Hawthorne EMT/HBOT Signed: 08/25/2019 5:21:50 PM By: Kela Millin Previous Signature: 08/19/2019 6:16:57 PM Version By: Kela Millin Entered By: Mikeal Hawthorne on 08/20/2019 08:16:59 -------------------------------------------------------------------------------- Vitals Details Patient Name: Date of Service: [name], [name] 08/19/2019 7:30 AM Medical Record [id number] Patient Account Number: [id number] Date of Birth/Sex: Treating RN: 02/05/1976 (43 y.o. [name] Primary Care Tana Trefry: [name], [name] Other Clinician: Referring Cristalle Rohm: Treating Brylei Pedley/Extender:[name], [name], [name] in Treatment: 16 Vital Signs Time Taken: 07:44 Temperature (F): 98.6 Height (in): 74 Pulse (bpm): 89 Weight (lbs): 325 Respiratory Rate (breaths/min): 18 Body Mass Index (BMI): 41.7 Blood Pressure (  mmHg): 135/85 Reference Range: 80 - 120 mg / dl Electronic Signature(s) Signed: 08/19/2019 6:16:57 PM By: Cherylin Mylarwiggins, Shannon Entered By: Cherylin Mylarwiggins, Shannon on 08/19/2019 07:49:48
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
## 2019-10-07 NOTE — Progress Notes (Signed)
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 Pulse regular and within target range for patient.Marland Kitchen Respirations regular, non-labored and within target range.. Temperature is normal and within the target range for the patient.Marland Kitchen Appears in no distress. Vitals Time Taken: 8:38 AM, Height: 74 in, Weight: 325 lbs, BMI: 41.7, Temperature: 98.3 F, Pulse: 87 bpm, Respiratory Rate: 18 breaths/min, Blood Pressure: 147/90 mmHg. Eyes Conjunctivae clear. No discharge.no icterus. Respiratory work of breathing is normal. Cardiovascular Pedal pulses palpable and strong bilaterally.. He have decent edema control. General Notes: Wound exam; superficial in the mid tibia area. I see absolutely no difference in this. Debrided with Anasept and gauze he did not require mechanical debridement. He has 2 areas on  the left lateral foot  which look cleaner and healthier this week I think these may have been secondary to small venule hypertension Integumentary (Hair, Skin) No evidence of infection around the wound. Some irritation at the top of the wrap. We have always been using TCA no evidence of infection. Wound #12 status is Open. Original cause of wound was Gradually Appeared. The wound is located on the Left,Lateral Foot. The wound measures 0.5cm length x 2.2cm width x 0.1cm depth; 0.864cm^2 area and 0.086cm^3 volume. There is Fat Layer (Subcutaneous Tissue) Exposed exposed. There is no tunneling or undermining noted. There is a small amount of serosanguineous drainage noted. The wound margin is distinct with the outline attached to the wound base. There is large (67-100%) pink granulation within the wound bed. There is a small (1-33%) amount of necrotic tissue within the wound bed including Adherent Slough. Wound #9RR status is Open. Original cause of wound was Gradually Appeared. The wound is located on the Left,Anterior Lower Leg. The wound measures 2.7cm length x 0.9cm width x 0.2cm depth; 1.909cm^2 area and 0.382cm^3 volume. There is Fat Layer (Subcutaneous Tissue) Exposed exposed. There is no tunneling or undermining noted. There is a medium amount of serous drainage noted. The wound margin is well defined and not attached to the wound base. There is large (67-100%) red, pink, friable granulation within the wound bed. There is no necrotic tissue within the wound bed. Assessment Active Problems ICD-10 Non-pressure chronic ulcer of other part of left lower leg limited to breakdown of skin Chronic venous hypertension (idiopathic) with inflammation of left lower extremity Non-pressure chronic ulcer of other part of left foot with other specified severity Lymphedema, not elsewhere classified Other thrombophilia Procedures Wound #12 Pre-procedure diagnosis of Wound #12 is a Venous Leg Ulcer  located on the Left,Lateral Foot . There was a Four Layer Compression Therapy Procedure by Carlene Coria, RN. Post procedure Diagnosis Wound #12: Same as Pre-Procedure Wound #9RR Pre-procedure diagnosis of Wound #9RR is a Venous Leg Ulcer located on the Left,Anterior Lower Leg . There was a Four Layer Compression Therapy Procedure by Carlene Coria, RN. Post procedure Diagnosis Wound #9RR: Same as Pre-Procedure Plan Follow-up Appointments: Return Appointment in 2 weeks. Nurse Visit: - next week Dressing Change Frequency: Wound #9RR Left,Anterior Lower Leg: Do not change entire dressing for one week. Skin Barriers/Peri-Wound Care: TCA Cream or Ointment Wound Cleansing: May shower with protection. Primary Wound Dressing: Wound #12 Left,Lateral Foot: Iodoflex - pad area with foam Wound #9RR Left,Anterior Lower Leg: Other: - sorbact moisten with hydrogel Secondary Dressing: Dry Gauze ABD pad Wound #12 Left,Lateral Foot: Foam Edema Control: 4 layer compression: Left lower extremity Avoid standing for long periods of time Elevate legs to the level of the heart or above for 30 minutes daily and/or when sitting, a frequency of: Support Garment 30-40 mm/Hg pressure to: - right leg : apply compression stockings dual layer in the am and remove at night. Segmental Compressive Device. - lymphedema pumps 1 hour 2 times per day. 1. I am continuing with Iodosorb/Iodoflex to the left lateral foot 2. Continue with Sorbac to the left anterior lower leg 3. The patient is trying to make a decision about whether to proceed with a alternative approach to his stenosed pelvic vein 4. Severe venous hypertension. 5. I have encouraged use of pumps over the compression wrap. 6. I would also like to get juxta lite stockings or similar for this patient currently for the right leg Electronic Signature(s) Signed: 09/16/2019 6:07:35 PM By: Linton Ham  MD Entered By: Baltazar Najjar on 09/16/2019  10:03:09 -------------------------------------------------------------------------------- SuperBill Details Patient Name: Date of Service: [name], [name] 09/16/2019 Medical Record [id number] Patient Account Number: [id number] Date of Birth/Sex: Treating RN: February 04, 1976 (43 y.o. [name]) [name] Primary Care Provider: [name], [name] Other Clinician: Referring Provider: Treating Provider/Extender:Klaus Casteneda, [name], [name] in Treatment: 20 Diagnosis Coding ICD-10 Codes Code Description [phone] Non-pressure chronic ulcer of other part of left lower leg limited to breakdown of skin I87.322 Chronic venous hypertension (idiopathic) with inflammation of left lower extremity L97.528 Non-pressure chronic ulcer of other part of left foot with other specified severity I89.0 Lymphedema, not elsewhere classified D68.69 Other thrombophilia Facility Procedures CPT4 Code Description: [id number] (Facility Use Only) [phone] - APPLY MULTLAY COMPRS LWR LT LEG Modifier: Quantity: 1 Physician Procedures CPT4 Code Description: [id number] - WC PHYS LEVEL 3 - EST PT ICD-10 Diagnosis Description L97.821 Non-pressure chronic ulcer of other part of left lower leg li skin L97.528 Non-pressure chronic ulcer of other part of left foot with ot I87.322 Chronic  venous hypertension (idiopathic) with inflammation of I89.0 Lymphedema, not elsewhere classified Modifier: 1 mited to breakdown her specified seve left lower extrem Quantity: of rity Surveyor, minerals) Signed: 09/16/2019 6:07:35 PM By: Baltazar Najjar MD Entered By: Baltazar Najjar on 09/16/2019 10:03:31

## 2019-10-07 NOTE — Progress Notes (Signed)
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 weeks Electronic Signature(s) Signed: 08/26/2019 6:05:35 PM By: Baltazar Najjar MD Entered By: Baltazar Najjar on 08/26/2019 13:46:43 -------------------------------------------------------------------------------- SuperBill Details Patient Name: Date of Service: [name], [name] 08/26/2019 Medical Record [id number] Patient Account Number: [id number] Date of Birth/Sex: Treating  RN: Nov 22, 1975 (43 y.o. [name] Primary Care Provider: [name], [name] Other Clinician: Referring Provider: Treating Provider/Extender:Jarod Bozzo, [name], [name] in Treatment: 17 Diagnosis Coding ICD-10 Codes Code Description L97.821 Non-pressure chronic ulcer of other part of left lower leg limited to breakdown of skin I87.322 Chronic venous hypertension (idiopathic) with inflammation of left lower extremity I89.0 Lymphedema, not elsewhere classified D68.69 Other thrombophilia L03.116 Cellulitis of left lower limb Facility Procedures Physician Procedures CPT4 Code Description: [id number] - WC PHYS LEVEL 3 - EST PT ICD-10 Diagnosis Description L97.821 Non-pressure chronic ulcer of other part of left lower l skin I87.322 Chronic venous hypertension (idiopathic) with inflammati I89.0 Lymphedema, not  elsewhere classified Modifier: eg limited to b on of left  lowe Quantity: 1 reakdown of r extremity Electronic Signature(s) Signed: 08/26/2019 6:05:35 PM By: Baltazar Najjar MD Entered By: Baltazar Najjar on 08/26/2019 13:47:00

## 2019-10-07 NOTE — Progress Notes (Signed)
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 procedure with Dr. Trula Ore was not successful. He thinks that Dr. Trula Ore will try to go at this from the other direction at some point. He sees him in 2-1/2-week 11/3; quite an improvement in wound surface area this week. We are using Hydrofera Blue. No need to change the dressing. Follows up with Dr. Trula Ore in about a week and a half 11/10; wound  surface not as good this week  at all. No changes in dimensions. We have been using Hydrofera Blue. As well he had tells [place] he had discomfort in his foot all week although he admits he was up on this more than usual. He comes in today with 2 small punched out areas on the left lateral foot. He has severe venous hypertension. He sees Dr. Trula Ore again on 11/16 11/17; not much change in the area on the anterior tibia mid aspect. He also has 2 areas on the left lateral foot. We have been using Sorbact on the left anterior and Iodosorb ointment on the left foot. He is under 4-layer compression. The patient went to see Dr. Trula Ore I do not yet have access to this note and care everywhere. However according to the patient there is an option to try and address his venous occlusion I think via a transjugular approach. This is complicated by the fact that the IVC filter is there and would have to be traversed. According to the patient there is about a 33% chance of perforation may be even aortic perforation. However they would be prepared for this. He is thinking about this and discussing it with his wife Thanks to our case manager this week we are able to determine if for some reason the patient is not using his compression [name]. He also is not able to get stockings on the right leg which are 30/40 below-knee. I think we may need to order external compression garments 12/1; patient arrives with the area on the left anterior mid tibia expanded superiorly small rim of skin between the original area and the new area. He has painful small denuded areas on the left medial and left lateral heel. We have been using Iodoflex. His next appointment with the Dr. Trula Ore is January 4. after the holidays to discuss if he would like to pursue this endovascular option. Dr. Sherrin Daisy notes below from last visit Subjective [name] is a 43 y.o. male with PMH of provoked DVT, right iliac vein stent and IVC filter  placement who is s/p balloon angioplasty of L common iliac vein via L SSV/ popliteal vein access on 08/14/2019 for symptomatic chronic venous insuffiencey d/t chronic obstruction of L common iliac vein. Unfortunately during the procedure a stent was not able to be placed d/t narrowing at the IVC. Per the patient the procedure provided 2-3 days of symptomatic relief (decrease in L LE pressure and pain) but after that his symptoms returned with increased pressure, pain in the hips and pain in the legs when standing. The patient does report his L LE wound is stable especially when he is consistently attending his wound care clinic in [place]. The patient denies using compressive LE therapies unless done at his wound clinic due to not being able to reach his legs. He is interested in learning more and possibly pursing further interventional options for his L lower extremity via access through neck. *Objective Physical Exam: BP 171/86  Pulse 80  Temp 37 C (Temporal)  Wt (!) 147.4 kg (325 lb)  BMI 41.73 kg/m General: awake, alert and oriented x 3 Chest: Non labored breathing on room air CVS: Normal rate. Regular rhythm. ABD: soft, NT, ND Ext: bilateral leg edema w/ left leg in compressive dressing from wound clinic. Evidence of skin discoloration on both feet bilaterally. Data Review: Labs: None to review Imaging:Reviewed report of 10/15 L LE venogram. I saw and evaluated the patient, participating in the key portions of the service. I reviewed the residentoos  note. I agree with the residentoos findings and plan. Electronically signed by Derry Skill, MD at 09/16/2019 10:53 AM EST Objective Constitutional Patient is hypertensive.. Pulse regular and within target range for patient.Marland Kitchen Respirations regular, non-labored and within target range.. Temperature is normal and within the target range for the patient.Marland Kitchen Appears in no distress. Vitals Time Taken: 8:22 AM, Height: 74  in, Source: Stated, Weight: 325 lbs, Source: Stated, BMI: 41.7, Temperature: 98.3 F, Pulse: 85 bpm, Respiratory Rate: 18 breaths/min, Blood Pressure: 143/83 mmHg. Respiratory work of breathing is normal. Cardiovascular Pedal pulses are palpable. He appears to have adequate edema control. Psychiatric appears at normal baseline. General Notes: Wound exam; the superficial area in the mid tibia has expanded to an additional wound just superior with a thin limb of skin above it. He continues to have areas on the medial and lateral foot which are small denuded areas with pain. Small dilated veins some of which appear thrombosed or whether this is why these areas breakdown and why there is so tender. Integumentary (Hair, Skin) Severe changes of chronic venous insufficiency. Wound #12 status is Open. Original cause of wound was Gradually Appeared. The wound is located on the Left,Lateral Foot. The wound measures 0.1cm length x 0.1cm width x 0.1cm depth; 0.008cm^2 area and 0.001cm^3 volume. The wound is limited to skin breakdown. There is no tunneling or undermining noted. There is a small amount of serous drainage noted. The wound margin is flat and intact. There is no granulation within the wound bed. There is no necrotic tissue within the wound bed. Wound #13 status is Open. Original cause of wound was Gradually Appeared. The wound is located on the Left,Proximal,Anterior Lower Leg. The wound measures 1.7cm length x 1.5cm width x 0.1cm depth; 2.003cm^2 area and 0.2cm^3 volume. There is Fat Layer (Subcutaneous Tissue) Exposed exposed. There is no tunneling or undermining noted. There is a medium amount of sanguinous drainage noted. The wound margin is flat and intact. There is large (67-100%) red granulation within the wound bed. There is no necrotic tissue within the wound bed. Wound #9RR status is Open. Original cause of wound was Gradually Appeared. The wound is located on the Left,Anterior  Lower Leg. The wound measures 3.1cm length x 1.3cm width x 0.2cm depth; 3.165cm^2 area and 0.633cm^3 volume. There is Fat Layer (Subcutaneous Tissue) Exposed exposed. There is no tunneling or undermining noted. There is a medium amount of sanguinous drainage noted. The wound margin is flat and intact. There is large (67-100%) red granulation within the wound bed. There is no necrotic tissue within the wound bed. Assessment Active Problems ICD-10 Non-pressure chronic ulcer of other part of left lower leg limited to breakdown of skin Chronic venous hypertension (idiopathic) with inflammation of left lower extremity Non-pressure chronic ulcer of other part of left foot with other specified severity [name], not elsewhere classified Other thrombophilia Procedures Wound #12 Pre-procedure diagnosis of Wound #12 is a Venous Leg Ulcer located on the Left,Lateral Foot . There was a Four Layer Compression Therapy Procedure by [name], RN. Post procedure Diagnosis Wound #12: Same as Pre-Procedure Wound #13 Pre-procedure diagnosis of Wound #13 is a Venous Leg Ulcer located on the Left,Proximal,Anterior Lower Leg . There was a Four Layer Compression Therapy Procedure by [name], RN. Post procedure Diagnosis Wound #13: Same as Pre-Procedure Wound #9RR Pre-procedure diagnosis of Wound #9RR is a Venous Leg Ulcer located on the Left,Anterior Lower Leg . There was a Four Layer Compression Therapy Procedure by Epps,  Lyla Son, Charity fundraiser. Post procedure Diagnosis Wound #9RR: Same as Pre-Procedure Plan Follow-up Appointments: Return Appointment in 1 week. Dressing Change Frequency: Wound #9RR Left,Anterior Lower Leg: Do not change entire dressing for one week. Skin Barriers/Peri-Wound Care: TCA Cream or Ointment Wound Cleansing: May shower with protection. Primary Wound Dressing: Wound #12 Left,Lateral Foot: Silver Collagen - moisten with hydrogel Wound #13 Left,Proximal,Anterior Lower  Leg: Silver Collagen - moisten with hydrogel Wound #9RR Left,Anterior Lower Leg: Silver Collagen - moisten with hydrogel Secondary Dressing: Wound #12 Left,Lateral Foot: Dry Gauze ABD pad Wound #13 Left,Proximal,Anterior Lower Leg: Dry Gauze ABD pad Wound #9RR Left,Anterior Lower Leg: Dry Gauze ABD pad Edema Control: 4 layer compression: Left lower extremity Avoid standing for long periods of time Elevate legs to the level of the heart or above for 30 minutes daily and/or when sitting, a frequency of: Support Garment 30-40 mm/Hg pressure to: - right leg : apply compression stockings dual layer in the am and remove at night. Segmental Compressive Device. - [name] 1 hour 2 times per day. 1. I change the primary dressing from Iodoflex to silver collagen to all areas 2. Still under 4-layer compression 3. He is using his compression [name] 3 times a day bilaterally. 4. He has made it clear that he wants to go ahead with any additional procedures suggested by Dr. Trula Ore even though there may be risks attached. Electronic Signature(s) Signed: 09/30/2019 6:45:39 PM By: Baltazar Najjar MD Entered By: Baltazar Najjar on 09/30/2019 09:13:44 -------------------------------------------------------------------------------- SuperBill Details Patient Name: Date of Service: XAINE, SANSOM 09/30/2019 Medical Record [id number] Patient Account Number: [id number] Date of Birth/Sex: Treating RN: [birth year] (43 y.o. [name]) [name] Primary Care Provider: [name], [name] Other Clinician: Referring Provider: Treating Provider/Extender:, [name], [name] in Treatment: 22 Diagnosis Coding ICD-10 Codes Code Description L97.821 Non-pressure chronic ulcer of other part of left lower leg limited to breakdown of skin I87.322 Chronic venous hypertension (idiopathic) with inflammation of left lower extremity L97.528 Non-pressure chronic ulcer of other part of left foot with  other specified severity I89.0 [name], not elsewhere classified D68.69 Other thrombophilia Facility Procedures CPT4 Code Description: [id number] (Facility Use Only) [phone] - APPLY MULTLAY COMPRS LWR LT LEG Modifier: Quantity: 1 Physician Procedures CPT4 Code Description: [id number] - WC PHYS LEVEL 3 - EST PT ICD-10 Diagnosis Description L97.821 Non-pressure chronic ulcer of other part of left lower leg skin I87.322 Chronic venous hypertension (idiopathic) with inflammation Modifier: limited to breakdo of left lower extr Quantity: 1 wn of emity Electronic Signature(s) Signed: 09/30/2019 6:45:39 PM By: Baltazar Najjar MD Entered By: Baltazar Najjar on 09/30/2019 09:14:02

## 2019-10-07 NOTE — Progress Notes (Signed)
YORDIN, RHODA ([id number]) Visit Report for 07/22/2019 Arrival Information Details Patient Name: Date of Service: Khyrin, Trevathan 07/22/2019 7:30 AM Medical Record [id number] Patient Account Number: [id number] Date of Birth/Sex: Treating RN: 1976-01-11 (43 y.o. [name] Primary Care Nichole Keltner: [name], [name] Other Clinician: Referring Niguel Moure: Treating Tenaya Hilyer/Extender:[name], [name], [name] in Treatment: 0 Visit Information History Since Last Visit All ordered tests and consults were completed: Yes Patient Arrived: Ambulatory Added or deleted any medications: Yes Arrival Time: 07:55 Any new allergies or adverse reactions: No Accompanied By: self Had a fall or experienced change in No Transfer Assistance: None activities of daily living that may affect Patient Identification Verified: Yes risk of falls: Secondary Verification Process Completed: Yes Signs or symptoms of abuse/neglect since last No Patient Requires Transmission-Based No visito Precautions: Hospitalized since last visit: Yes Patient Has Alerts: No Implantable device outside of the clinic excluding No cellular tissue based products placed in the center since last visit: Pain Present Now: Yes Notes pt seen at ED yesterday Electronic Signature(s) Signed: 07/22/2019 5:53:31 PM By: Baruch Gouty RN, BSN Entered By: Baruch Gouty on 07/22/2019 08:17:31 -------------------------------------------------------------------------------- Compression Therapy Details Patient Name: Date of Service: Ambrosio, Reuter 07/22/2019 7:30 AM Medical Record [id number] Patient Account Number: [id number] Date of Birth/Sex: Treating RN: May 22, 1976 (43 y.o. [name]) Carlene Coria Primary Care Sreshta Cressler: [name], [name] Other Clinician: Referring Tanaia Hawkey: Treating Jaymar Loeber/Extender:[name], [name], [name] in Treatment: 0 Compression Therapy Performed for Wound Wound #[id number]  Left,Proximal,Anterior Lower Leg Assessment: Performed By: [name] Church, RN Compression Type: Four Layer Post Procedure Diagnosis Same as Pre-procedure Electronic Signature(s) Signed: 10/07/2019 3:04:22 PM By: Carlene Coria RN Entered By: Carlene Coria on 07/22/2019 08:32:29 -------------------------------------------------------------------------------- Compression Therapy Details Patient Name: Date of Service: Jian, Hodgman 07/22/2019 7:30 AM Medical Record [id number] Patient Account Number: [id number] Date of Birth/Sex: Treating RN: 03-22-1976 (43 y.o. Oval Linsey Primary Care Kareli Hossain: [name], [name] Other Clinician: Referring Grover Woodfield: Treating Taralee Marcus/Extender:[name], [name], [name] in Treatment: 0 Compression Therapy Performed for Wound Wound #9RR Left,Anterior Lower Leg Assessment: Performed By: [name] Church, RN Compression Type: Four Layer Post Procedure Diagnosis Same as Pre-procedure Electronic Signature(s) Signed: 10/07/2019 3:04:22 PM By: Carlene Coria RN Entered By: Carlene Coria on 07/22/2019 08:32:29 -------------------------------------------------------------------------------- Encounter Discharge Information Details Patient Name: Date of Service: Adolpho, Meenach 07/22/2019 7:30 AM Medical Record [id number] Patient Account Number: [id number] Date of Birth/Sex: Treating RN: 07-28-1976 (43 y.o. [name] Primary Care Eliyah Mcshea: [name], [name] Other Clinician: Referring Aarushi Hemric: Treating Raylea Adcox/Extender:[name], [name], [name] in Treatment: 0 Encounter Discharge Information Items Discharge Condition: Stable Ambulatory Status: Ambulatory Discharge Destination: Home Transportation: Private Auto Accompanied By: self Schedule Follow-up Appointment: Yes Clinical Summary of Care: Electronic Signature(s) Signed: 07/22/2019 5:45:36 PM By: Deon Pilling Entered By: Deon Pilling on 07/22/2019  08:53:50 -------------------------------------------------------------------------------- Lower Extremity Assessment Details Patient Name: Date of Service: Alcides, Nutting 07/22/2019 7:30 AM Medical Record [id number] Patient Account Number: [id number] Date of Birth/Sex: Treating RN: [birth year] (43 y.o. [name] Primary Care Montrel Donahoe: [name], [name] Other Clinician: Referring Keeley Sussman: Treating Kaytelynn Scripter/Extender:[name], [name], [name] in Treatment: 0 Edema Assessment Assessed: [Left: No] [Right: No] Edema: [Left: Ye] [Right: s] Calf Left: Right: Point of Measurement: 35 cm From Medial Instep 35 cm cm Ankle Left: Right: Point of Measurement: 11.5 cm From Medial Instep 28 cm cm Vascular Assessment Pulses: Dorsalis Pedis Palpable: [Left:Yes] Electronic Signature(s) Signed: 08/07/2019 4:32:31 PM By: Kela Millin Entered By: Kela Millin on 07/22/2019 08:04:30 -------------------------------------------------------------------------------- Multi Wound Chart Details Patient Name: Date of Service:  Najir, Roop 07/22/2019 7:30 AM Medical Record [id number] Patient Account Number: [id number] Date of Birth/Sex: Treating RN: 1976/01/23 (43 y.o. [name] Primary Care Paradise Vensel: [name], [name] Other Clinician: Referring Tyron Manetta: Treating Keontae Levingston/Extender:[name], [name], [name] in Treatment: 0 Vital Signs Height(in): 74 Pulse(bpm): 90 Weight(lbs): 325 Blood Pressure(mmHg): 140/89 Body Mass Index(BMI): 42 Temperature(F): 98.5 Respiratory 18 Rate(breaths/min): Photos: [11R:No Photos] [9RR:No Photos] [N/A:N/A] Wound Location: [11R:Left Lower Leg - Anterior, Left Lower Leg - Anterior N/A Proximal] Wounding Event: [11R:Gradually Appeared] [9RR:Gradually Appeared] [N/A:N/A] Primary Etiology: [11R:Venous Leg Ulcer] [9RR:Venous Leg Ulcer] [N/A:N/A] Date Acquired: [11R:05/22/2019] [9RR:03/31/2019] [N/A:N/A] Weeks of  Treatment: [11R:8] [9RR:12] [N/A:N/A] Wound Status: [11R:Open] [9RR:Open] [N/A:N/A] Wound Recurrence: [11R:Yes] [9RR:Yes] [N/A:N/A] Clustered Wound: [11R:Yes] [9RR:No] [N/A:N/A] Clustered Quantity: [11R:4] [9RR:N/A] [N/A:N/A] Measurements L x W x D 2.6x3.5x0.1 [9RR:6.4x4.5x0.1] [N/A:N/A] (cm) Area (cm) : [11R:7.147] [9RR:22.619] [N/A:N/A] Volume (cm) : [11R:0.715] [9RR:2.262] [N/A:N/A] % Reduction in Area: [11R:-3546.40%] [9RR:-71.40%] [N/A:N/A] % Reduction in Volume: -3475.00% [9RR:-71.50%] [N/A:N/A] Classification: [11R:Full Thickness Without Exposed Support Structures Exposed Support Structures] [9RR:Full Thickness Without] [N/A:N/A] Exudate Amount: [11R:Medium] [9RR:Medium] [N/A:N/A] Exudate Type: [11R:Serosanguineous] [9RR:Serosanguineous] [N/A:N/A] Exudate Color: [11R:red, brown] [9RR:red, brown] [N/A:N/A] Granulation Amount: [11R:Medium (34-66%)] [9RR:Medium (34-66%)] [N/A:N/A] Granulation Quality: [11R:Red, Pink] [9RR:Red, Pink] [N/A:N/A] Necrotic Amount: [11R:Medium (34-66%)] [9RR:Medium (34-66%)] [N/A:N/A] Exposed Structures: [11R:Fat Layer (Subcutaneous Fat Layer (Subcutaneous N/A Tissue) Exposed: Yes Fascia: No Tendon: No Muscle: No Joint: No Bone: No] [9RR:Tissue) Exposed: Yes Fascia: No Tendon: No Muscle: No Joint: No Bone: No] Epithelialization: [11R:None] [9RR:None Compression Therapy] [N/A:N/A N/A] Treatment Notes Electronic Signature(s) Signed: 07/22/2019 5:41:07 PM By: Baltazar Najjar MD Signed: 07/28/2019 6:25:06 PM By: Zandra Abts RN, BSN Entered By: Baltazar Najjar on 07/22/2019 08:41:37 -------------------------------------------------------------------------------- Multi-Disciplinary Care Plan Details Patient Name: Date of Service: Emre, Stock 07/22/2019 7:30 AM Medical Record [id number] Patient Account Number: [id number] Date of Birth/Sex: Treating RN: [birth year] (43 y.o. [name] Primary Care Saveah Bahar: [name], [name] Other  Clinician: Referring Dominic Rhome: Treating Giovan Pinsky/Extender:[name], [name], [name] in Treatment: 0 Active Inactive Wound/Skin Impairment Nursing Diagnoses: Knowledge deficit related to ulceration/compromised skin integrity Goals: Patient/caregiver will verbalize understanding of skin care regimen Date Initiated: 07/22/2019 Target Resolution Date: 08/22/2019 Goal Status: Active Ulcer/skin breakdown will have a volume reduction of 30% by week 4 Date Initiated: 07/22/2019 Target Resolution Date: 08/22/2019 Goal Status: Active Interventions: Assess patient/caregiver ability to obtain necessary supplies Assess patient/caregiver ability to perform ulcer/skin care regimen upon admission and as needed Assess ulceration(s) every visit Notes: Electronic Signature(s) Signed: 10/07/2019 3:04:22 PM By: [name] RN Entered By: [name] on 07/22/2019 08:00:22 -------------------------------------------------------------------------------- Pain Assessment Details Patient Name: Date of Service: Kwabena, Strutz 07/22/2019 7:30 AM Medical Record [id number] Patient Account Number: [id number] Date of Birth/Sex: Treating RN: 1976/01/05 (43 y.o. [name] Primary Care Natosha Bou: [name], [name] Other Clinician: Referring Dorman Calderwood: Treating Gentry Seeber/Extender:[name], [name], [name] in Treatment: 0 Active Problems Location of Pain Severity and Description of Pain Patient Has Paino Yes Site Locations Site Locations Pain Location: Pain in Ulcers With Dressing Change: Yes Duration of the Pain. Constant / Intermittento Constant Rate the pain. Current Pain Level: 6 Worst Pain Level: 9 Least Pain Level: 4 Tolerable Pain Level: 5 Character of Pain Describe the Pain: Burning Pain Management and Medication Current Pain Management: Electronic Signature(s) Signed: 08/07/2019 4:32:31 PM By: [name] Entered By: [name] on 07/22/2019  08:02:21 -------------------------------------------------------------------------------- Patient/Caregiver Education Details Patient Name: Date of Service: [name] 9/22/2020andnbsp7:30 AM Medical Record [id number] Patient Account Number: [id number] Date of Birth/Gender: [birth year] (43 y.o. M) Treating RN:  [name] Primary Care Physician: [name], [name] Other Clinician: Referring Physician: Treating Physician/Extender:[name], [name], [name] in Treatment: 0 Education Assessment Education Provided To: Patient Education Topics Provided Wound/Skin Impairment: Methods: Explain/Verbal Responses: State content correctly Electronic Signature(s) Signed: 10/07/2019 3:04:22 PM By: [name] RN Entered By: [name] on 07/22/2019 08:01:50 -------------------------------------------------------------------------------- Wound Assessment Details Patient Name: Date of Service: Sael, Furches 07/22/2019 7:30 AM Medical Record [id number] Patient Account Number: [id number] Date of Birth/Sex: Treating RN: 04/18/1976 (43 y.o. [name] Primary Care Zayvion Stailey: [name], [name] Other Clinician: Referring Suraj Ramdass: Treating Adesuwa Osgood/Extender:[name], [name], [name] in Treatment: 0 Wound Status Wound Number: [id number] Primary Venous Leg Ulcer Etiology: Wound Location: Left Lower Leg - Anterior, Proximal Wound Open Wounding Event: Gradually Appeared Status: Date Acquired: 05/22/2019 Comorbid Asthma, Sleep Apnea, Deep Vein Weeks Of Treatment: 8 History: Thrombosis, Phlebitis, Osteoarthritis, Clustered Wound: Yes Neuropathy Photos Wound Measurements Length: (cm) 2.6 % Reduc Width: (cm) 3.5 % Reduc Depth: (cm) 0.1 Epithel Clustered Quantity: 4 Undermi Area: (cm) 7.147 Volume: (cm) 0.715 Wound Description Classification: Full Thickness Without Exposed Support Foul Od Structures Slough/ Exudate Medium Amount: Exudate  Serosanguineous Type: Exudate red, brown Color: Wound Bed Granulation Amount: Medium (34-66%) Granulation Quality: Red, Pink Fascia Necrotic Amount: Medium (34-66%) Fat Lay Necrotic Quality: Adherent Slough Tendon Muscle Joint E Bone Ex Electronic Signature(s) Signed: 07/25/2019 8:42:43 AM By: Benjaman Kindler EMT/HBOT Signed: 08/07/2019 4:32:31 PM By: [name] Entered By: Benjaman Kindler on 09/23 or After Cleansing: No Fibrino Yes Exposed Structure Exposed: No er (Subcutaneous Tissue) Exposed: Yes Exposed: No Exposed: No xposed: No posed: No /2020 08:21:24 tion in Area: -3546.4% tion in Volume: -3475% ialization: None ning: No -------------------------------------------------------------------------------- Wound Assessment Details Patient Name: Date of Service: Draeden, Kellman 07/22/2019 7:30 AM Medical Record [id number] Patient Account Number: [id number] Date of Birth/Sex: Treating RN: 27-Dec-1975 (43 y.o. [name] Primary Care Nabeel Gladson: [name], [name] Other Clinician: Referring Adreanne [name]: Treating Vaden Becherer/Extender:[name], [name], [name] in Treatment: 0 Wound Status Wound Number: [id number] Primary Venous Leg Ulcer Etiology: Wound Location: Left Lower Leg - Anterior Wound Open Wounding Event: Gradually Appeared Status: Date Acquired: 03/31/2019 Comorbid Asthma, Sleep Apnea, Deep Vein Thrombosis, Weeks Of Treatment: 12 History: Phlebitis, Osteoarthritis, Neuropathy Clustered Wound: No Photos Wound Measurements Length: (cm) 6.4 % Reduct Width: (cm) 4.5 % Reduct Depth: (cm) 0.1 Epitheli Area: (cm) 22.619 Tunneli Volume: (cm) 2.262 Undermi Wound Description Full Thickness Without Exposed Support Foul Odo Classification: Structures Slough/F Exudate Medium Amount: Exudate Serosanguineous Type: Exudate red, brown Color: Wound Bed Granulation Amount: Medium (34-66%) Granulation Quality: Red, Pink Fascia Expo Necrotic  Amount: Medium (34-66%) Fat Layer ( Necrotic Quality: Adherent Slough Tendon Expo Muscle Expo Joint Expos Bone Expose r After Cleansing: No ibrino Yes Exposed Structure sed: No Subcutaneous Tissue) Exposed: Yes sed: No sed: No ed: No d: No ion in Area: -71.4% ion in Volume: -71.5% alization: None ng: No ning: No Electronic Signature(s) Signed: 07/25/2019 8:42:43 AM By: Benjaman Kindler EMT/HBOT Signed: 08/07/2019 4:32:31 PM By: [name] Entered By: Benjaman Kindler on 07/23/2019 08:21:44 -------------------------------------------------------------------------------- Vitals Details Patient Name: Date of Service: Breven, Guidroz 07/22/2019 7:30 AM Medical Record [id number] Patient Account Number: [id number] Date of Birth/Sex: Treating RN: [birth year] (43 y.o. [name] Primary Care Devlin Brink: [name], [name] Other Clinician: Referring Misao Fackrell: Treating Amaria Mundorf/Extender:[name], [name], [name] in Treatment: 0 Vital Signs Time Taken: 07:55 Temperature (F): 98.5 Height (in): 74 Pulse (bpm): 90 Source: Stated Respiratory Rate (breaths/min): 18 Weight (lbs): 325 Blood Pressure (mmHg): 140/89 Source: Stated Reference  Range: 80 - 120 mg / dl Body Mass Index (BMI): 41.7 Electronic Signature(s) Signed: 08/07/2019 4:32:31 PM By: Cherylin Mylarwiggins, Shannon Entered By: Cherylin Mylarwiggins, Shannon on 07/22/2019 08:00:19

## 2019-10-07 NOTE — Progress Notes (Signed)
[name], [name] ([id number]) Visit Report for 09/16/2019 Arrival Information Details Patient Name: Date of Service: [name], [name] 09/16/2019 8:15 AM Medical Record [id number] Patient Account Number: [id number] Date of Birth/Sex: Treating RN: 03-15-1976 (43 y.o. [name] Primary Care Christin Moline: [name], [name] Other Clinician: Referring Taji Sather: Treating Kilyn Maragh/Extender:[name], [name], [name] in Treatment: 20 Visit Information History Since Last Visit Added or deleted any medications: No Patient Arrived: Ambulatory Any new allergies or adverse reactions: No Arrival Time: 08:37 Had a fall or experienced change in No Accompanied By: self activities of daily living that may affect Transfer Assistance: None risk of falls: Patient Identification Verified: Yes Signs or symptoms of abuse/neglect since last No Secondary Verification Process Completed: Yes visito Patient Requires Transmission-Based No Hospitalized since last visit: No Precautions: Implantable device outside of the clinic excluding No Patient Has Alerts: No cellular tissue based products placed in the center since last visit: Has Dressing in Place as Prescribed: Yes Has Compression in Place as Prescribed: Yes Pain Present Now: No Electronic Signature(s) Signed: 09/18/2019 6:05:31 PM By: Kela Millin Entered By: Kela Millin on 09/16/2019 08:38:25 -------------------------------------------------------------------------------- Compression Therapy Details Patient Name: Date of Service: [name], [name] 09/16/2019 8:15 AM Medical Record [id number] Patient Account Number: [id number] Date of Birth/Sex: Treating RN: 04-Sep-1976 (43 y.o. Oval Linsey Primary Care Sundi Slevin: [name], [name] Other Clinician: Referring Hameed Kolar: Treating Oviya Ammar/Extender:[name], [name], [name] in Treatment: 20 Compression Therapy Performed for Wound Wound #12 Left,Lateral  Foot Assessment: Performed By: Clinician Carlene Coria, RN Compression Type: Four Layer Post Procedure Diagnosis Same as Pre-procedure Electronic Signature(s) Signed: 10/07/2019 2:50:47 PM By: Carlene Coria RN Entered By: Carlene Coria on 09/16/2019 09:14:17 -------------------------------------------------------------------------------- Compression Therapy Details Patient Name: Date of Service: [name], [name] 09/16/2019 8:15 AM Medical Record [id number] Patient Account Number: [id number] Date of Birth/Sex: Treating RN: 1976/09/26 (43 y.o. Oval Linsey Primary Care Addyson Traub: [name], [name] Other Clinician: Referring Cola Highfill: Treating Deveney Bayon/Extender:[name], [name], [name] in Treatment: 20 Compression Therapy Performed for Wound Wound #9RR Left,Anterior Lower Leg Assessment: Performed By: [name] Church, RN Compression Type: Four Layer Post Procedure Diagnosis Same as Pre-procedure Electronic Signature(s) Signed: 10/07/2019 2:50:47 PM By: Carlene Coria RN Entered By: Carlene Coria on 09/16/2019 09:14:17 -------------------------------------------------------------------------------- Encounter Discharge Information Details Patient Name: Date of Service: [name], [name] 09/16/2019 8:15 AM Medical Record [id number] Patient Account Number: [id number] Date of Birth/Sex: Treating RN: April 19, 1976 (43 y.o. [name] Primary Care Romey Mathieson: [name], [name] Other Clinician: Referring Bhavesh Vazquez: Treating Zoi Devine/Extender:[name], [name], [name] in Treatment: 20 Encounter Discharge Information Items Discharge Condition: Stable Ambulatory Status: Ambulatory Discharge Destination: Home Transportation: Private Auto Accompanied By: self Schedule Follow-up Appointment: Yes Clinical Summary of Care: Patient Declined Electronic Signature(s) Signed: 09/18/2019 6:05:31 PM By: Kela Millin Entered By: Kela Millin on 09/16/2019  09:34:46 -------------------------------------------------------------------------------- Lower Extremity Assessment Details Patient Name: Date of Service: [name], [name] 09/16/2019 8:15 AM Medical Record [id number] Patient Account Number: [id number] Date of Birth/Sex: Treating RN: 03-Dec-1975 (43 y.o. [name] Primary Care Glenn Gullickson: [name], [name] Other Clinician: Referring Lendon George: Treating Emillio Ngo/Extender:[name], [name], [name] in Treatment: 20 Edema Assessment Assessed: [Left: No] [Right: No] Edema: [Left: Ye] [Right: s] Calf Left: Right: Point of Measurement: 35 cm From Medial Instep 44.3 cm cm Ankle Left: Right: Point of Measurement: 11.5 cm From Medial Instep 24.5 cm cm Vascular Assessment Pulses: Dorsalis Pedis Palpable: [Left:Yes] Electronic Signature(s) Signed: 09/18/2019 6:05:31 PM By: Kela Millin Entered By: Kela Millin on 09/16/2019 08:42:20 -------------------------------------------------------------------------------- Multi Wound Chart Details Patient Name: Date of Service: [name],  [name] 09/16/2019 8:15 AM Medical Record [id number] Patient Account Number: [id number] Date of Birth/Sex: Treating RN: 12/14/1975 (43 y.o. [name]) Carlene Coria Primary Care Hildur Bayer: [name], [name] Other Clinician: Referring Sherisse Fullilove: Treating Grizelda Piscopo/Extender:[name], [name], [name] in Treatment: 20 Vital Signs Height(in): 74 Pulse(bpm): 33 Weight(lbs): 325 Blood Pressure(mmHg): 147/90 Body Mass Index(BMI): 42 Temperature(F): 98.3 Respiratory 18 Rate(breaths/min): Photos: [12:No Photos] [9RR:No Photos] [N/A:N/A] Wound Location: [12:Left Foot - Lateral] [9RR:Left Lower Leg - Anterior] [N/A:N/A] Wounding Event: [12:Gradually Appeared] [9RR:Gradually Appeared] [N/A:N/A] Primary Etiology: [12:Venous Leg Ulcer] [9RR:Venous Leg Ulcer] [N/A:N/A] Comorbid History: [12:Asthma, Sleep Apnea, DeepAsthma, Sleep Apnea,  DeepN/A Vein Thrombosis, Phlebitis, Vein Thrombosis, Phlebitis, Osteoarthritis, Neuropathy Osteoarthritis, Neuropathy] Date Acquired: [12:09/09/2019] [9RR:03/31/2019] [N/A:N/A] Weeks of Treatment: [12:1] [9RR:20] [N/A:N/A] Wound Status: [12:Open] [9RR:Open] [N/A:N/A] Wound Recurrence: [12:No] [9RR:Yes] [N/A:N/A] Clustered Wound: [12:Yes] [9RR:No] [N/A:N/A] Clustered Quantity: [12:2] [9RR:N/A] [N/A:N/A] Measurements L x W x D 0.5x2.2x0.1 [9RR:2.7x0.9x0.2] [N/A:N/A] (cm) Area (cm) : [12:0.864] [9RR:1.909] [N/A:N/A] Volume (cm) : [12:0.086] [9RR:0.382] [N/A:N/A] % Reduction in Area: [12:-1738.30%] [9RR:85.50%] [N/A:N/A] % Reduction in Volume: -1620.00% [9RR:71.00%] [N/A:N/A] Classification: [12:Full Thickness Without Exposed Support Structures Exposed Support Structures] [9RR:Full Thickness Without] [N/A:N/A] Exudate Amount: [12:Small] [9RR:Medium] [N/A:N/A] Exudate Type: [12:Serosanguineous] [9RR:Serous] [N/A:N/A] Exudate Color: [12:red, brown] [9RR:amber] [N/A:N/A] Wound Margin: [12:Distinct, outline attached Well defined, not attached N/A] Granulation Amount: [12:Large (67-100%)] [9RR:Large (67-100%)] [N/A:N/A] Granulation Quality: [12:Pink] [9RR:Red, Pink, Friable] [N/A:N/A] Necrotic Amount: [12:Small (1-33%)] [9RR:None Present (0%)] [N/A:N/A] Exposed Structures: [12:Fat Layer (Subcutaneous Fat Layer (Subcutaneous N/A Tissue) Exposed: Yes Fascia: No Tendon: No Muscle: No Joint: No Bone: No] [9RR:Tissue) Exposed: Yes Fascia: No Tendon: No Muscle: No Joint: No Bone: No] Epithelialization: [12:None] [9RR:Small (1-33%) Compression Therapy] [N/A:N/A N/A] Treatment Notes Wound #12 (Left, Lateral Foot) 1. Cleanse With Wound Cleanser Soap and water 2. Periwound Care TCA Cream 3. Primary Dressing Applied Other primary dressing (specifiy in notes) Iodoflex 4. Secondary Dressing ABD Pad Dry Gauze Foam 6. Support Layer Applied 4 layer compression wrap Notes sorbact/abd to anterior leg,  iodoflex/foam to lateral foot Wound #9RR (Left, Anterior Lower Leg) 1. Cleanse With Wound Cleanser Soap and water 2. Periwound Care TCA Cream 3. Primary Dressing Applied Other primary dressing (specifiy in notes) Iodoflex 4. Secondary Dressing ABD Pad Dry Gauze Foam 6. Support Layer Applied 4 layer compression wrap Notes sorbact/abd to anterior leg, iodoflex/foam to lateral foot Electronic Signature(s) Signed: 09/16/2019 6:07:35 PM By: Linton Ham MD Signed: 10/07/2019 2:50:47 PM By: Carlene Coria RN Entered By: Linton Ham on 09/16/2019 09:58:30 -------------------------------------------------------------------------------- Multi-Disciplinary Care Plan Details Patient Name: Date of Service: JAEGAR, CROFT 09/16/2019 8:15 AM Medical Record [id number] Patient Account Number: [id number] Date of Birth/Sex: Treating RN: Nov 29, 1975 (43 y.o. Oval Linsey Primary Care Jalesia Loudenslager: [name], [name] Other Clinician: Referring Adeola Dennen: Treating Taimane Stimmel/Extender:[name], [name], [name] in Treatment: 20 Active Inactive Wound/Skin Impairment Nursing Diagnoses: Knowledge deficit related to ulceration/compromised skin integrity Goals: Patient/caregiver will verbalize understanding of skin care regimen Date Initiated: 07/22/2019 Target Resolution Date: 09/19/2019 Goal Status: Active Ulcer/skin breakdown will have a volume reduction of 30% by week 4 Target Resolution Date Initiated: 07/22/2019 Date Inactivated: 08/26/2019 Date: 08/22/2019 Goal Status: Met Ulcer/skin breakdown will have a volume reduction of 50% by week 8 Date Initiated: 08/26/2019 Target Resolution Date: 09/19/2019 Goal Status: Active Interventions: Assess patient/caregiver ability to obtain necessary supplies Assess patient/caregiver ability to perform ulcer/skin care regimen upon admission and as needed Assess ulceration(s) every visit Notes: Electronic Signature(s) Signed: 10/07/2019  2:50:47 PM By: Carlene Coria RN Entered By: Carlene Coria on 09/16/2019 08:46:47 -------------------------------------------------------------------------------- Pain Assessment  Details Patient Name: Date of Service: JASMEET, GEHL 09/16/2019 8:15 AM Medical Record [id number] Patient Account Number: [id number] Date of Birth/Sex: Treating RN: 12-Feb-1976 (43 y.o. [name] Primary Care Jomar Denz: [name], [name] Other Clinician: Referring Oria Klimas: Treating Palak Tercero/Extender:[name], [name], [name] in Treatment: 20 Active Problems Location of Pain Severity and Description of Pain Patient Has Paino No Site Locations Pain Management and Medication Current Pain Management: Electronic Signature(s) Signed: 09/18/2019 6:05:31 PM By: Kela Millin Entered By: Kela Millin on 09/16/2019 08:39:02 -------------------------------------------------------------------------------- Patient/Caregiver Education Details Patient Name: Date of Service: [name] 11/17/2020andnbsp8:15 AM Medical Record [id number] Patient Account Number: [id number] Date of Birth/Gender: [birth year] (43 y.o. M) Treating RN: Carlene Coria Primary Care Physician: [name], [name] Other Clinician: Referring Physician: Treating Physician/Extender:[name], [name], [name] in Treatment: 20 Education Assessment Education Provided To: Patient Education Topics Provided Wound/Skin Impairment: Methods: Explain/Verbal Responses: State content correctly Electronic Signature(s) Signed: 10/07/2019 2:50:47 PM By: Carlene Coria RN Entered By: Carlene Coria on 09/16/2019 08:47:27 -------------------------------------------------------------------------------- Wound Assessment Details Patient Name: Date of Service: CAMEO, SHEWELL 09/16/2019 8:15 AM Medical Record [id number] Patient Account Number: [id number] Date of Birth/Sex: Treating RN: [birth year] (43 y.o. [name] Primary Care Ford Peddie: [name], [name] Other Clinician: Referring Latiya Navia: Treating Opal Mckellips/Extender:[name], [name], [name] in Treatment: 20 Wound Status Wound Number: 12 Primary Venous Leg Ulcer Etiology: Wound Location: Left Foot - Lateral Wound Open Wounding Event: Gradually Appeared Status: Date Acquired: 09/09/2019 Comorbid Asthma, Sleep Apnea, Deep Vein Weeks Of Treatment: 1 History: Thrombosis, Phlebitis, Osteoarthritis, Clustered Wound: Yes Clustered Wound: Yes Neuropathy Photos Wound Measurements Length: (cm) 0.5 % Reduct Width: (cm) 2.2 % Reduct Depth: (cm) 0.1 Epitheli Clustered Quantity: 2 Tunnelin Area: (cm) 0.864 Undermi Volume: (cm) 0.086 Wound Description Full Thickness Without Exposed Support Foul Od Classification: Structures Slough/ Wound Distinct, outline attached Margin: Exudate Small Amount: Exudate Serosanguineous Type: Exudate red, brown Color: Wound Bed Granulation Amount: Large (67-100%) Granulation Quality: Pink Fascia Necrotic Amount: Small (1-33%) Fat Lay Necrotic Quality: Adherent Slough Tendon Muscle Joint E Bone Ex or After Cleansing: No Fibrino Yes Exposed Structure Exposed: No er (Subcutaneous Tissue) Exposed: Yes Exposed: No Exposed: No xposed: No posed: No ion in Area: -1738.3% ion in Volume: -1620% alization: None g: No ning: No Electronic Signature(s) Signed: 09/17/2019 4:21:10 PM By: Mikeal Hawthorne EMT/HBOT Signed: 09/18/2019 6:05:31 PM By: Kela Millin Entered By: Mikeal Hawthorne on 09/17/2019 09:24:20 -------------------------------------------------------------------------------- Wound Assessment Details Patient Name: Date of Service: CHE, RACHAL 09/16/2019 8:15 AM Medical Record [id number] Patient Account Number: [id number] Date of Birth/Sex: Treating RN: 08/15/1976 (43 y.o. [name] Primary Care Jaydin Boniface: [name], [name] Other Clinician: Referring  Derius Ghosh: Treating Harman Ferrin/Extender:[name], [name], [name] in Treatment: 20 Wound Status Wound Number: 9RR Primary Venous Leg Ulcer Etiology: Wound Location: Left Lower Leg - Anterior Wound Open Wounding Event: Gradually Appeared Status: Date Acquired: 03/31/2019 Comorbid Asthma, Sleep Apnea, Deep Vein Thrombosis, Weeks Of Treatment: 20 History: Phlebitis, Osteoarthritis, Neuropathy Clustered Wound: No Photos Wound Measurements Length: (cm) 2.7 % Reduct Width: (cm) 0.9 % Reduct Depth: (cm) 0.2 Epitheli Area: (cm) 1.909 Tunneli Volume: (cm) 0.382 Undermi Wound Description Classification: Full Thickness Without Exposed Support Foul Od Structures Slough/ Wound Well defined, not attached Margin: Exudate Medium Amount: Exudate Serous Type: Exudate amber Color: Wound Bed Granulation Amount: Large (67-100%) Granulation Quality: Red, Pink, Friable Fascia Necrotic Amount: None Present (0%) Fat Lay Tendon Muscle Joint E Bone Ex or After Cleansing: No Fibrino No Exposed Structure Exposed: No er (Subcutaneous Tissue) Exposed: Yes Exposed: No Exposed:  No xposed: No posed: No ion in Area: 85.5% ion in Volume: 71% alization: Small (1-33%) ng: No ning: No Electronic Signature(s) Signed: 09/17/2019 4:21:10 PM By: Mikeal Hawthorne EMT/HBOT Signed: 09/18/2019 6:05:31 PM By: Kela Millin Entered By: Mikeal Hawthorne on 09/17/2019 09:24:44 -------------------------------------------------------------------------------- Vitals Details Patient Name: Date of Service: FINNEAS, MATHE 09/16/2019 8:15 AM Medical Record [id number] Patient Account Number: [id number] Date of Birth/Sex: Treating RN: 12-Mar-1976 (43 y.o. [name] Primary Care Kenyata Guess: [name], [name] Other Clinician: Referring Lillah Standre: Treating Adalis Gatti/Extender:[name], [name], [name] in Treatment: 20 Vital Signs Time Taken: 08:38 Temperature (F): 98.3 Height  (in): 74 Pulse (bpm): 87 Weight (lbs): 325 Respiratory Rate (breaths/min): 18 Body Mass Index (BMI): 41.7 Blood Pressure (mmHg): 147/90 Reference Range: 80 - 120 mg / dl Electronic Signature(s) Signed: 09/18/2019 6:05:31 PM By: Kela Millin Entered By: Kela Millin on 09/16/2019 08:38:56

## 2019-10-07 NOTE — Progress Notes (Signed)
[name], [place] ([id number]) Visit Report for 07/29/2019 Arrival Information Details Patient Name: Date of Service: [name], [name] 07/29/2019 10:30 AM Medical Record [id number] Patient Account Number: [id number] Date of Birth/Sex: Treating RN: 08/21/1976 (43 y.o. [name] RightM) Dwiggins, Shannon Primary Care Seymour Pavlak: Docia ChuckKoirala, [name] Other Clinician: Karl Itoawkins, Destiny Referring Abigaelle Verley: Treating Jamonta Goerner/Extender:[name], Effie ShyMichael Koirala, [name] in Treatment: 13 Visit Information History Since Last Visit Added or deleted any medications: No Patient Arrived: Ambulatory Any new allergies or adverse reactions: No Arrival Time: 09:43 Had a fall or experienced change in No Accompanied By: self activities of daily living that may affect Transfer Assistance: None risk of falls: Patient Identification Verified: Yes Signs or symptoms of abuse/neglect since last No Secondary Verification Process Yes visito Completed: Hospitalized since last visit: No Patient Requires Transmission-Based No Implantable device outside of the clinic excluding No Precautions: cellular tissue based products placed in the center Patient Has Alerts: No since last visit: Has Dressing in Place as Prescribed: Yes Has Compression in Place as Prescribed: Yes Pain Present Now: Yes Electronic Signature(s) Signed: 08/07/2019 4:32:31 PM By: Cherylin Mylarwiggins, Shannon Entered By: Cherylin Mylarwiggins, Shannon on 07/29/2019 09:46:17 -------------------------------------------------------------------------------- Compression Therapy Details Patient Name: Date of Service: [name], [name] 07/29/2019 10:30 AM Medical Record [id number] Patient Account Number: [id number] Date of Birth/Sex: Treating RN: 08/21/1976 (43 y.o. Judie PetitM) Yevonne PaxEpps, Carrie Primary Care Vernisha Bacote: Docia ChuckKoirala, [name] Other Clinician: Karl Itoawkins, Destiny Referring Anuar Walgren: Treating Aadya Kindler/Extender:[name], Effie ShyMichael Koirala, [name] in Treatment: 13 Compression Therapy Performed for  Wound Wound #[id number] Left,Proximal,Anterior Lower Leg Assessment: Performed By: Clinician Yevonne PaxEpps, Carrie, RN Compression Type: Four Layer Post Procedure Diagnosis Same as Pre-procedure Electronic Signature(s) Signed: 10/07/2019 2:58:33 PM By: Yevonne PaxEpps, Carrie RN Entered By: Yevonne PaxEpps, Carrie on 07/29/2019 10:23:54 -------------------------------------------------------------------------------- Compression Therapy Details Patient Name: Date of Service: [name], [name] 07/29/2019 10:30 AM Medical Record [id number] Patient Account Number: [id number] Date of Birth/Sex: Treating RN: 08/21/1976 (43 y.o. Melonie FloridaM) Epps, Carrie Primary Care Shaquanda Graves: Docia ChuckKoirala, [name] Other Clinician: Karl Itoawkins, Destiny Referring Syvanna Ciolino: Treating Siddharth Babington/Extender:[name], Effie ShyMichael Koirala, [name] in Treatment: 13 Compression Therapy Performed for Wound Wound #[id number] Left,Anterior Lower Leg Assessment: Performed By: Little Ishikawalinician Epps, Carrie, RN Compression Type: Four Layer Post Procedure Diagnosis Same as Pre-procedure Electronic Signature(s) Signed: 10/07/2019 2:58:33 PM By: Yevonne PaxEpps, Carrie RN Entered By: Yevonne PaxEpps, Carrie on 07/29/2019 10:23:55 -------------------------------------------------------------------------------- Encounter Discharge Information Details Patient Name: Date of Service: [name], [name] 07/29/2019 10:30 AM Medical Record [id number] Patient Account Number: [id number] Date of Birth/Sex: Treating RN: 08/21/1976 (43 y.o. [name] RightM) Dwiggins, Shannon Primary Care Inigo Lantigua: Docia ChuckKoirala, [name] Other Clinician: Karl Itoawkins, Destiny Referring Evey Mcmahan: Treating Hal Norrington/Extender:[name], Effie ShyMichael Koirala, [name] in Treatment: 13 Encounter Discharge Information Items Post Procedure Vitals Discharge Condition: Stable Temperature (F): 98.7 Ambulatory Status: Ambulatory Pulse (bpm): 91 Discharge Destination: Home Respiratory Rate (breaths/min): 18 Transportation: Private Auto Blood Pressure (mmHg): 172/89 Accompanied By:  self Schedule Follow-up Appointment: Yes Clinical Summary of Care: Patient Declined Electronic Signature(s) Signed: 08/07/2019 4:32:31 PM By: Cherylin Mylarwiggins, Shannon Entered By: Cherylin Mylarwiggins, Shannon on 07/29/2019 10:48:27 -------------------------------------------------------------------------------- Lower Extremity Assessment Details Patient Name: Date of Service: [name], [name] 07/29/2019 10:30 AM Medical Record [id number] Patient Account Number: [id number] Date of Birth/Sex: Treating RN: 08/21/1976 (43 y.o. [name] RightM) Dwiggins, Shannon Primary Care Sho Salguero: Docia ChuckKoirala, [name] Other Clinician: Karl Itoawkins, Destiny Referring Glenda Kunst: Treating Rasha Ibe/Extender:[name], Effie ShyMichael Koirala, [name] in Treatment: 13 Edema Assessment Assessed: [Left: No] [Right: No] Edema: [Left: Ye] [Right: s] Calf Left: Right: Point of Measurement: 35 cm From Medial Instep 45.5 cm cm Ankle Left: Right: Point of Measurement: 11.5 cm From Medial Instep 26.5 cm cm Vascular Assessment Pulses: Dorsalis Pedis Palpable: [  Left:Yes] Electronic Signature(s) Signed: 08/07/2019 4:32:31 PM By: Kela Millin Entered By: Kela Millin on 07/29/2019 09:56:02 -------------------------------------------------------------------------------- Multi-Disciplinary Care Plan Details Patient Name: Date of Service: [name], [name] 07/29/2019 10:30 AM Medical Record [phone] Patient Account Number: [id number] Date of Birth/Sex: Treating RN: [birth year] (43 y.o. [name]) Carlene Coria Primary Care Jamarkus Lisbon: [name], [name] Other Clinician: Sandre Kitty Referring Cerra Eisenhower: Treating Pyper Olexa/Extender:[name], [name], [name] in Treatment: 13 Active Inactive Wound/Skin Impairment Nursing Diagnoses: Knowledge deficit related to ulceration/compromised skin integrity Goals: Patient/caregiver will verbalize understanding of skin care regimen Date Initiated: 07/22/2019 Target Resolution Date: 08/22/2019 Goal Status:  Active Ulcer/skin breakdown will have a volume reduction of 30% by week 4 Date Initiated: 07/22/2019 Target Resolution Date: 08/22/2019 Goal Status: Active Interventions: Assess patient/caregiver ability to obtain necessary supplies Assess patient/caregiver ability to perform ulcer/skin care regimen upon admission and as needed Assess ulceration(s) every visit Notes: Electronic Signature(s) Signed: 10/07/2019 2:58:33 PM By: Carlene Coria RN Entered By: Carlene Coria on 07/29/2019 10:17:09 -------------------------------------------------------------------------------- Pain Assessment Details Patient Name: Date of Service: [name], [name] 07/29/2019 10:30 AM Medical Record [id number] Patient Account Number: [id number] Date of Birth/Sex: Treating RN: Feb 01, 1976 (43 y.o. [name] Primary Care Maverik Foot: [name], [name] Other Clinician: Sandre Kitty Referring Johnrobert Foti: Treating Ranisha Allaire/Extender:[name], [name], [name] in Treatment: 13 Active Problems Location of Pain Severity and Description of Pain Patient Has Paino Yes Site Locations Pain Location: Pain in Ulcers With Dressing Change: Yes Duration of the Pain. Constant / Intermittento Constant Rate the pain. Current Pain Level: 3 Worst Pain Level: 8 Least Pain Level: 3 Tolerable Pain Level: 4 Character of Pain Describe the Pain: Aching, Burning Pain Management and Medication Current Pain Management: Electronic Signature(s) Signed: 08/07/2019 4:32:31 PM By: Kela Millin Entered By: Kela Millin on 07/29/2019 09:46:27 -------------------------------------------------------------------------------- Patient/Caregiver Education Details Patient Name: [name] 9/29/2020andnbsp10:30 Date of Service: AM Medical Record [id number] Number: Patient Account Number: [id number] Treating RN: Date of Birth/Gender: 01-25-1976 (43 y.o. Oval Linsey) Other Clinician: Sandre Kitty Primary  Care Physician: [name], [name] Treating Linton Ham Referring Physician: Physician/Extender: Lujean Amel Weeks in Treatment: 13 Education Assessment Education Provided To: Patient Education Topics Provided Wound/Skin Impairment: Methods: Explain/Verbal Responses: State content correctly Electronic Signature(s) Signed: 10/07/2019 2:58:33 PM By: Carlene Coria RN Entered By: Carlene Coria on 07/29/2019 10:17:30 -------------------------------------------------------------------------------- Wound Assessment Details Patient Name: Date of Service: [name], [name] 07/29/2019 10:30 AM Medical Record [id number] Patient Account Number: [id number] Date of Birth/Sex: Treating RN: 11-07-1975 (43 y.o. [name] Primary Care Yobani Schertzer: [name], [name] Other Clinician: Sandre Kitty Referring Mordecai Tindol: Treating Roslind Michaux/Extender:[name], [name], [name] in Treatment: 13 Wound Status Wound Number: [id number] Primary Venous Leg Ulcer Etiology: Wound Location: Left Lower Leg - Anterior, Proximal Wound Open Wounding Event: Gradually Appeared Status: Date Acquired: 05/22/2019 Comorbid Asthma, Sleep Apnea, Deep Vein Weeks Of Treatment: 9 History: Thrombosis, Phlebitis, Osteoarthritis, Clustered Wound: Yes Neuropathy Photos Wound Measurements Length: (cm) 1 % Reduc Width: (cm) 3 % Reduc Depth: (cm) 0.1 Epithel Clustered Quantity: 4 Tunneli Area: (cm) 2.356 Underm Volume: (cm) 0.236 Wound Description Classification: Full Thickness Without Exposed Support Foul O Structures Slough Wound Flat and Intact Margin: Exudate Medium Amount: Exudate Purulent Type: Exudate yellow, brown, green Color: Wound Bed Granulation Amount: Medium (34-66%) Granulation Quality: Red, Pink Fascia Necrotic Amount: Medium (34-66%) Fat La Necrotic Quality: Eschar, Adherent Slough Tendon Muscle Joint E Bone Ex dor After Cleansing: No /Fibrino Yes Exposed Structure Exposed:  No yer (Subcutaneous Tissue) Exposed: Yes Exposed: No Exposed: No xposed: No posed: No tion in Area: -  1102% tion in Volume: -1080% ialization: None ng: No ining: No Electronic Signature(s) Signed: 07/30/2019 8:44:12 AM By: Benjaman Kindler EMT/HBOT Signed: 08/07/2019 4:32:31 PM By: [name] Entered By: Benjaman Kindler on 07/30/2019 08:42:30 -------------------------------------------------------------------------------- Wound Assessment Details Patient Name: Date of Service: [name], [name] 07/29/2019 10:30 AM Medical Record [id number] Patient Account Number: [id number] Date of Birth/Sex: Treating RN: December 14, 1975 (42 y.o. [name] Primary Care Tarrence Enck: [name], [name] Other Clinician: Karl Ito Referring Yosgart Pavey: Treating Krystyne Tewksbury/Extender:[name], [name], [name] in Treatment: 13 Wound Status Wound Number: [id number] Primary Venous Leg Ulcer Etiology: Wound Location: Left Lower Leg - Anterior Wound Open Wounding Event: Gradually Appeared Status: Date Acquired: 03/31/2019 Comorbid Asthma, Sleep Apnea, Deep Vein Weeks Of Treatment: 13 History: Thrombosis, Phlebitis, Osteoarthritis, Clustered Wound: No Neuropathy Photos Wound Measurements Length: (cm) 4 % Reduct Width: (cm) 2.5 % Reduct Depth: (cm) 0.1 Epitheli Area: (cm) 7.854 Tunneli Volume: (cm) 0.785 Undermi Wound Description Classification: Full Thickness Without Exposed Support Foul Od Structures Slough/ Wound Flat and Intact Margin: Exudate Medium Amount: Exudate Purulent Type: Exudate yellow, brown, green Color: Wound Bed Granulation Amount: Medium (34-66%) Granulation Quality: Red, Pink Fascia Expo Necrotic Amount: Medium (34-66%) Fat Layer ( Necrotic Quality: Eschar, Adherent Slough Tendon Expo Muscle Expo Joint Expos Bone Expose or After Cleansing: No Fibrino Yes Exposed Structure sed: No Subcutaneous Tissue) Exposed: Yes sed: No sed: No ed: No d:  No ion in Area: 40.5% ion in Volume: 40.5% alization: None ng: No ning: No Electronic Signature(s) Signed: 07/30/2019 8:44:12 AM By: Benjaman Kindler EMT/HBOT Signed: 08/07/2019 4:32:31 PM By: [name] Entered By: Benjaman Kindler on 07/30/2019 08:42:02 -------------------------------------------------------------------------------- Vitals Details Patient Name: Date of Service: [name], [name] 07/29/2019 10:30 AM Medical Record [id number] Patient Account Number: [id number] Date of Birth/Sex: Treating RN: 06/08/1976 (43 y.o. [name] Primary Care Angle Dirusso: [name], [name] Other Clinician: Karl Ito Referring Calea Hribar: Treating Skyleigh Windle/Extender:[name], [name], [name] in Treatment: 13 Vital Signs Time Taken: 09:44 Temperature (F): 98.7 Height (in): 74 Pulse (bpm): 91 Weight (lbs): 325 Respiratory Rate (breaths/min): 18 Body Mass Index (BMI): 41.7 Blood Pressure (mmHg): 172/89 Reference Range: 80 - 120 mg / dl Electronic Signature(s) Signed: 08/07/2019 4:32:31 PM By: [name] Entered By: [name] on 07/29/2019 09:46:20

## 2019-10-07 NOTE — Progress Notes (Signed)
[name], [name] ([id number]) Visit Report for 09/09/2019 Arrival Information Details Patient Name: Date of Service: [name], [name] 09/09/2019 9:15 AM Medical Record [id number] Patient Account Number: [id number] Date of Birth/Sex: Treating RN: January 16, 1976 (43 y.o. [name]) Carlene Coria Primary Care Compton Brigance: [name], [name] Other Clinician: Referring Margart Zemanek: Treating Merion Caton/Extender:[name], [name], [name] in Treatment: 29 Visit Information History Since Last Visit Added or deleted any medications: No Patient Arrived: Ambulatory Any new allergies or adverse reactions: No Arrival Time: 09:23 Had a fall or experienced change in No Accompanied By: self activities of daily living that may affect Transfer Assistance: None risk of falls: Patient Identification Verified: Yes Signs or symptoms of abuse/neglect since last No Secondary Verification Process Completed: Yes visito Patient Requires Transmission-Based No Hospitalized since last visit: No Precautions: Implantable device outside of the clinic excluding No Patient Has Alerts: No cellular tissue based products placed in the center since last visit: Has Dressing in Place as Prescribed: Yes Pain Present Now: Yes Electronic Signature(s) Signed: 10/07/2019 3:00:22 PM By: Sandre Kitty Entered By: Sandre Kitty on 09/09/2019 09:23:56 -------------------------------------------------------------------------------- Compression Therapy Details Patient Name: Date of Service: [name], [name] 09/09/2019 9:15 AM Medical Record [id number] Patient Account Number: [id number] Date of Birth/Sex: Treating RN: [birth year] (43 y.o. [name]) Carlene Coria Primary Care Koran Seabrook: [name], [name] Other Clinician: Referring Chavis Tessler: Treating Herb Beltre/Extender:[name], [name], [name] in Treatment: 19 Compression Therapy Performed for Wound Wound #12 Left,Lateral Foot Assessment: Performed By: [name] Church,  RN Compression Type: Four Layer Post Procedure Diagnosis Same as Pre-procedure Electronic Signature(s) Signed: 10/07/2019 2:53:59 PM By: Carlene Coria RN Entered By: Carlene Coria on 09/09/2019 10:19:24 -------------------------------------------------------------------------------- Compression Therapy Details Patient Name: Date of Service: [name], [name] 09/09/2019 9:15 AM Medical Record [id number] Patient Account Number: [id number] Date of Birth/Sex: Treating RN: 1976-08-11 (43 y.o. Oval Linsey Primary Care Koron Godeaux: [name], [name] Other Clinician: Referring Calene Paradiso: Treating Jaiden Wahab/Extender:[name], [name], [name] in Treatment: 19 Compression Therapy Performed for Wound Wound #9RR Left,Anterior Lower Leg Assessment: Performed By: [name] Church, RN Compression Type: Four Layer Post Procedure Diagnosis Same as Pre-procedure Electronic Signature(s) Signed: 10/07/2019 2:53:59 PM By: Carlene Coria RN Entered By: Carlene Coria on 09/09/2019 10:19:24 -------------------------------------------------------------------------------- Encounter Discharge Information Details Patient Name: Date of Service: [name], [name] 09/09/2019 9:15 AM Medical Record [id number] Patient Account Number: [id number] Date of Birth/Sex: Treating RN: 07-27-1976 (43 y.o. [name] Primary Care Jesstin Studstill: [name], [name] Other Clinician: Referring Shauniece Kwan: Treating Jaqulyn Chancellor/Extender:[name], [name], [name] in Treatment: 19 Encounter Discharge Information Items Post Procedure Vitals Discharge Condition: Stable Temperature (F): 98.4 Ambulatory Status: Ambulatory Pulse (bpm): 80 Discharge Destination: Home Respiratory Rate (breaths/min): 18 Transportation: Private Auto Blood Pressure (mmHg): 154/81 Accompanied By: alone Schedule Follow-up Appointment: Yes Clinical Summary of Care: Patient Declined Electronic Signature(s) Signed: 09/15/2019 5:59:11  PM By: [name] RN, BSN Entered By: [name] on 09/09/2019 10:52:36 -------------------------------------------------------------------------------- Lower Extremity Assessment Details Patient Name: Date of Service: [name], [name] 09/09/2019 9:15 AM Medical Record [id number] Patient Account Number: [id number] Date of Birth/Sex: Treating RN: [birth year] (43 y.o. [name] Primary Care Daneshia Tavano: [name], [name] Other Clinician: Referring Danyele Smejkal: Treating Aritzel Krusemark/Extender:[name], [name], [name] in Treatment: 19 Edema Assessment Assessed: [Left: No] [Right: No] Edema: [Left: Ye] [Right: s] Calf Left: Right: Point of Measurement: 35 cm From Medial Instep 42.5 cm cm Ankle Left: Right: Point of Measurement: 11.5 cm From Medial Instep 24.4 cm cm Vascular Assessment Pulses: Dorsalis Pedis Palpable: [Left:Yes] Electronic Signature(s) Signed: 09/12/2019 5:20:59 PM By: Kela Millin Entered By: Kela Millin on 09/09/2019  09:48:34 -------------------------------------------------------------------------------- Multi Wound Chart Details Patient Name: Date of Service: [name], [name] 09/09/2019 9:15 AM Medical Record [id number] Patient Account Number: [id number] Date of Birth/Sex: Treating RN: 09-21-1976 (43 y.o. [name]) Carlene Coria Primary Care Denell Cothern: [name], [name] Other Clinician: Referring Masyn Fullam: Treating Kaliopi Blyden/Extender:[name], [name], [name] in Treatment: 19 Vital Signs Height(in): 74 Pulse(bpm): 80 Weight(lbs): 325 Blood Pressure(mmHg): 154/81 Body Mass Index(BMI): 42 Temperature(F): 98.4 Respiratory 18 Rate(breaths/min): Photos: [12:No Photos] [9RR:No Photos] [N/A:N/A] Wound Location: [12:Left Foot - Lateral] [9RR:Left Lower Leg - Anterior] [N/A:N/A] Wounding Event: [12:Gradually Appeared] [9RR:Gradually Appeared] [N/A:N/A] Primary Etiology: [12:Venous Leg Ulcer] [9RR:Venous Leg Ulcer]  [N/A:N/A] Comorbid History: [12:Asthma, Sleep Apnea, DeepAsthma, Sleep Apnea, DeepN/A Vein Thrombosis, Phlebitis, Vein Thrombosis, Phlebitis, Osteoarthritis, Neuropathy Osteoarthritis, Neuropathy] Date Acquired: [12:09/09/2019] [9RR:03/31/2019] [N/A:N/A] Weeks of Treatment: [12:0] [9RR:19] [N/A:N/A] Wound Status: [12:Open] [9RR:Open] [N/A:N/A] Wound Recurrence: [12:No] [9RR:Yes] [N/A:N/A] Measurements L x W x D 0.3x0.2x0.1 [9RR:2.6x0.7x0.2] [N/A:N/A] (cm) Area (cm) : [12:0.047] [9RR:1.429] [N/A:N/A] Volume (cm) : [12:0.005] [9RR:0.286] [N/A:N/A] % Reduction in Area: [12:0.00%] [9RR:89.20%] [N/A:N/A] % Reduction in Volume: 0.00% [9RR:78.30%] [N/A:N/A] Classification: [12:Full Thickness Without Exposed Support Structures Exposed Support Structures] [9RR:Full Thickness Without] [N/A:N/A] Exudate Amount: [12:Small] [9RR:Medium] [N/A:N/A] Exudate Type: [12:Serosanguineous] [9RR:Serous] [N/A:N/A] Exudate Color: [12:red, brown] [9RR:amber] [N/A:N/A] Wound Margin: [12:Distinct, outline attached Flat and Intact] [N/A:N/A] Granulation Amount: [12:Large (67-100%)] [9RR:Large (67-100%)] [N/A:N/A] Granulation Quality: [12:Pink] [9RR:Red, Pink] [N/A:N/A] Necrotic Amount: [12:None Present (0%)] [9RR:None Present (0%)] [N/A:N/A] Exposed Structures: [12:Fat Layer (Subcutaneous Fat Layer (Subcutaneous N/A Tissue) Exposed: Yes Fascia: No Tendon: No Muscle: No Joint: No Bone: No] [9RR:Tissue) Exposed: Yes Fascia: No Tendon: No Muscle: No Joint: No Bone: No] Epithelialization: [12:None] [9RR:Small (1-33%)] [N/A:N/A] Debridement: [12:N/A] [9RR:Debridement - Excisional N/A] Pre-procedure [12:N/A] [9RR:10:03] [N/A:N/A] Verification/Time Out Taken: Pain Control: [12:N/A] [9RR:Lidocaine 5% topical ointment] [N/A:N/A] Tissue Debrided: [12:N/A] [9RR:Subcutaneous, Slough] [N/A:N/A] Level: [12:N/A] [9RR:Skin/Subcutaneous Tissue N/A] Debridement Area (sq cm):N/A [9RR:1.82] [N/A:N/A] Instrument: [12:N/A]  [9RR:Curette] [N/A:N/A] Bleeding: [12:N/A] [9RR:Moderate] [N/A:N/A] Hemostasis Achieved: [12:N/A] [9RR:Pressure] [N/A:N/A] Procedural Pain: [12:N/A] [9RR:4] [N/A:N/A] Post Procedural Pain: [12:N/A] [9RR:0] [N/A:N/A] Debridement Treatment N/A [9RR:Procedure was tolerated] [N/A:N/A] Response: [9RR:well] Post Debridement [12:N/A] [9RR:2.6x0.7x0.2] [N/A:N/A] Measurements L x W x D (cm) Post Debridement [12:N/A] [9RR:0.286] [N/A:N/A] Volume: (cm) Procedures Performed: Compression Therapy [9RR:Compression Therapy Debridement] [N/A:N/A] Treatment Notes Wound #12 (Left, Lateral Foot) 1. Cleanse With Soap and water 2. Periwound Care TCA Ointment 3. Primary Dressing Applied Iodoflex 4. Secondary Dressing Foam 6. Support Layer Applied 4 layer compression wrap Wound #9RR (Left, Anterior Lower Leg) 1. Cleanse With Soap and water 2. Periwound Care TCA Ointment 3. Primary Dressing Applied Other primary dressing (specifiy in notes) 4. Secondary Dressing ABD Pad Dry Gauze 6. Support Layer Applied 4 layer compression wrap Notes Wellsite geologist) Signed: 09/09/2019 5:17:49 PM By: Linton Ham MD Signed: 10/07/2019 2:53:59 PM By: Carlene Coria RN Entered By: Linton Ham on 09/09/2019 11:01:58 -------------------------------------------------------------------------------- Bryant Details Patient Name: Date of Service: [name], [name] 09/09/2019 9:15 AM Medical Record [id number] Patient Account Number: [id number] Date of Birth/Sex: Treating RN: June 14, 1976 (43 y.o. Oval Linsey Primary Care Janney Priego: [name], [name] Other Clinician: Referring Levie Wages: Treating Allayah Raineri/Extender:[name], [name], [name] in Treatment: 19 Active Inactive Wound/Skin Impairment Nursing Diagnoses: Knowledge deficit related to ulceration/compromised skin integrity Goals: Patient/caregiver will verbalize understanding of skin care regimen Date  Initiated: 07/22/2019 Target Resolution Date: 09/19/2019 Goal Status: Active Ulcer/skin breakdown will have a volume reduction of 30% by week 4 Date Inactivated: 08/26/2019 Target Resolution Date Initiated: 07/22/2019 Date: 08/22/2019 Goal Status: Met Ulcer/skin breakdown will have  a volume reduction of 50% by week 8 Date Initiated: 08/26/2019 Target Resolution Date: 09/19/2019 Goal Status: Active Interventions: Assess patient/caregiver ability to obtain necessary supplies Assess patient/caregiver ability to perform ulcer/skin care regimen upon admission and as needed Assess ulceration(s) every visit Notes: Electronic Signature(s) Signed: 10/07/2019 2:53:59 PM By: Carlene Coria RN Entered By: Carlene Coria on 09/09/2019 09:36:03 -------------------------------------------------------------------------------- Pain Assessment Details Patient Name: Date of Service: [name], [name] 09/09/2019 9:15 AM Medical Record [id number] Patient Account Number: [id number] Date of Birth/Sex: Treating RN: Jun 14, 1976 (43 y.o. Oval Linsey Primary Care Winter Jocelyn: [name], [name] Other Clinician: Referring Carleton Van[place]ken[place]: Treating Sequoya Hogsett/Extender:[name], [name], [name] in Treatment: 19 Active Problems Location of Pain Severity and Description of Pain Patient Has Paino Yes Site Locations Rate the pain. Current Pain Level: 5 Pain Management and Medication Current Pain Management: Electronic Signature(s) Signed: 10/07/2019 2:53:59 PM By: Carlene Coria RN Signed: 10/07/2019 3:00:22 PM By: Sandre Kitty Entered By: Sandre Kitty on 09/09/2019 09:26:48 -------------------------------------------------------------------------------- Patient/Caregiver Education Details Patient Name: Date of Service: [name] 11/10/2020andnbsp9:15 AM Medical Record [id number] Patient Account Number: [id number] Date of Birth/Gender: 03-23-1976 (43 y.o. M) Treating RN: Carlene Coria Primary  Care Physician: [name], [name] Other Clinician: Referring Physician: Treating Physician/Extender:[name], [name], [name] in Treatment: 33 Education Assessment Education Provided To: Patient Education Topics Provided Wound/Skin Impairment: Methods: Explain/Verbal Responses: State content correctly Electronic Signature(s) Signed: 10/07/2019 2:53:59 PM By: Carlene Coria RN Entered By: Carlene Coria on 09/09/2019 09:36:23 -------------------------------------------------------------------------------- Wound Assessment Details Patient Name: Date of Service: [name], [name] 09/09/2019 9:15 AM Medical Record [id number] Patient Account Number: [id number] Date of Birth/Sex: Treating RN: [birth year] (43 y.o. [name]) Carlene Coria Primary Care Keno Caraway: [name], [name] Other Clinician: Referring Ozell Juhasz: Treating Mette Southgate/Extender:[name], [name], [name] in Treatment: 19 Wound Status Wound Number: [id number] Primary Venous Leg Ulcer Etiology: Wound Location: Left Foot - Lateral Wound Open Wounding Event: Gradually Appeared Status: Date Acquired: 09/09/2019 Comorbid Asthma, Sleep Apnea, Deep Vein Weeks Of Treatment: 0 History: Thrombosis, Phlebitis, Osteoarthritis, Clustered Wound: No Neuropathy Photos Wound Measurements Length: (cm) 0.3 % Reduct Width: (cm) 0.2 % Reduct Depth: (cm) 0.1 Epitheli Area: (cm) 0.047 Tunneli Volume: (cm) 0.005 Undermi Wound Description Classification: Full Thickness Without Exposed Support Structures Wound Distinct, outline attached Margin: Exudate Small Amount: Exudate Serosanguineous Type: Exudate red, brown Color: Wound Bed Granulation Amount: Large (67-100%) Granulation Quality: Pink Necrotic Amount: None Present (0%) Foul Odor After Cleansing: No Slough/Fibrino No Exposed Structure Fascia Exposed: No Fat Layer (Subcutaneous Tissue) Exposed: Yes Tendon Exposed: No Muscle Exposed: No Joint Exposed: No Bone  Exposed: No ion in Area: 0% ion in Volume: 0% alization: None ng: No ning: No Electronic Signature(s) Signed: 09/11/2019 3:27:21 PM By: Mikeal Hawthorne EMT/HBOT Signed: 10/07/2019 2:53:59 PM By: Carlene Coria RN Entered By: Mikeal Hawthorne on 09/11/2019 11:13:35 -------------------------------------------------------------------------------- Wound Assessment Details Patient Name: Date of Service: [name], [name] 09/09/2019 9:15 AM Medical Record [id number] Patient Account Number: [id number] Date of Birth/Sex: Treating RN: Jun 12, 1976 (43 y.o. Oval Linsey Primary Care Tashina Credit: [name], [name] Other Clinician: Referring Yumna Ebers: Treating Dhruvi Crenshaw/Extender:[name], [name], [name] in Treatment: 19 Wound Status Wound Number: [id number] Primary Venous Leg Ulcer Etiology: Wound Location: Left Lower Leg - Anterior Wound Open Wounding Event: Gradually Appeared Status: Date Acquired: 03/31/2019 Comorbid Asthma, Sleep Apnea, Deep Vein Thrombosis, Weeks Of Treatment: 19 History: Phlebitis, Osteoarthritis, Neuropathy Clustered Wound: No Photos Wound Measurements Length: (cm) 2.6 % Reduct Width: (cm) 0.7 % Reduct Depth: (cm) 0.2 Epitheli Area: (cm) 1.429 Tunneli Volume: (cm) 0.286 Undermi Wound Description Classification: Full  Thickness Without Exposed Support Foul Od Structures Slough/ Wound Flat and Intact Margin: Exudate Medium Amount: Exudate Serous Type: Exudate Exudate amber Color: Wound Bed Granulation Amount: Large (67-100%) Granulation Quality: Red, Pink Fascia Expo Necrotic Amount: None Present (0%) Fat Layer ( Tendon Expo Muscle Expo Joint Expos Bone Expose or After Cleansing: No Fibrino No Exposed Structure sed: No Subcutaneous Tissue) Exposed: Yes sed: No sed: No ed: No d: No ion in Area: 89.2% ion in Volume: 78.3% alization: Small (1-33%) ng: No ning: No Electronic Signature(s) Signed: 09/11/2019 3:27:21 PM By: Mikeal Hawthorne  EMT/HBOT Signed: 10/07/2019 2:53:59 PM By: Carlene Coria RN Entered By: Mikeal Hawthorne on 09/11/2019 11:13:14 -------------------------------------------------------------------------------- Vitals Details Patient Name: Date of Service: [name], [name] 09/09/2019 9:15 AM Medical Record [id number] Patient Account Number: [id number] Date of Birth/Sex: Treating RN: 09-03-1976 (43 y.o. Oval Linsey Primary Care Viviana Trimble: [name], [name] Other Clinician: Referring Fard Borunda: Treating Ameia Morency/Extender:[name], [name], [name] in Treatment: 19 Vital Signs Time Taken: 09:23 Temperature (F): 98.4 Height (in): 74 Pulse (bpm): 80 Weight (lbs): 325 Respiratory Rate (breaths/min): 18 Body Mass Index (BMI): 41.7 Blood Pressure (mmHg): 154/81 Reference Range: 80 - 120 mg / dl Electronic Signature(s) Signed: 10/07/2019 3:00:22 PM By: Sandre Kitty Entered By: Sandre Kitty on 09/09/2019 09:26:36

## 2019-10-07 NOTE — Progress Notes (Signed)
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 3 sq cm performed by [name]., MD. With the following instrument(s): Curette to remove Viable and Non-Viable tissue/material. Material removed includes Subcutaneous Tissue and Slough and after achieving pain control using Other (benzocaine 20%). No specimens were taken. A time out was conducted at 10:18, prior to the start of the procedure. A Minimum amount of bleeding was controlled with Pressure. The procedure was tolerated well with a  pain level of 0 throughout and a pain level of 0 following the procedure. Post Debridement Measurements: 1cm length x 3cm width x 0.1cm depth; 0.236cm^3 volume. Character of Wound/Ulcer Post Debridement is improved. Severity of Tissue Post Debridement is: Fat layer exposed. Post procedure Diagnosis Wound #11R: Same as Pre-Procedure Pre-procedure diagnosis of Wound #11R is a Venous Leg Ulcer located on the Left,Proximal,Anterior Lower Leg . There was a Four Layer Compression Therapy Procedure by [name] Pax, RN. Post procedure Diagnosis Wound #11R: Same as Pre-Procedure Wound #9RR Pre-procedure diagnosis of Wound #9RR is a Venous Leg Ulcer located on the Left,Anterior Lower Leg .Severity of Tissue Pre Debridement is: Fat layer exposed. There was a Excisional Skin/Subcutaneous Tissue Debridement with a total area of 10 sq cm performed by [name]., MD. With the following instrument(s): Curette to remove Viable and Non-Viable tissue/material. Material removed includes Subcutaneous Tissue and Slough and after achieving pain control using Other (benzocaine 20%). No specimens were taken. A time out was conducted at 10:18, prior to the start of the procedure. A Minimum amount of bleeding was controlled with Pressure. The procedure was tolerated well with a pain level of 0 throughout and a pain level of 0 following the procedure. Post Debridement Measurements: 4cm length x 2.5cm width x 0.1cm depth; 0.785cm^3 volume. Character of Wound/Ulcer Post Debridement is improved. Severity of Tissue Post Debridement is: Fat layer exposed. Post procedure Diagnosis Wound #9RR: Same as Pre-Procedure Pre-procedure diagnosis of Wound #9RR is a Venous Leg Ulcer located on  the Left,Anterior Lower Leg . There was a Four Layer Compression Therapy Procedure by [name] PaxEpps, Carrie, RN. Post procedure Diagnosis Wound #9RR: Same as Pre-Procedure Plan Dressing Change Frequency: Wound #11R Left,Proximal,Anterior Lower  Leg: Do not change entire dressing for one week. Wound #9RR Left,Anterior Lower Leg: Do not change entire dressing for one week. Skin Barriers/Peri-Wound Care: Wound #9RR Left,Anterior Lower Leg: TCA Cream or Ointment Wound Cleansing: May shower with protection. Primary Wound Dressing: Hydrofera Blue - CLASSIC - moisten with normal saline, hydrogel or KY jelly Secondary Dressing: Dry Gauze ABD pad Edema Control: 4 layer compression: Left lower extremity Avoid standing for long periods of time Elevate legs to the level of the heart or above for 30 minutes daily and/or when sitting, a frequency of: Support Garment 30-40 mm/Hg pressure to: - right leg : apply compression stockings dual layer in the am and remove at night. Segmental Compressive Device. - once insurance approves use lymphedema pumps an hour each day. 1. I change the primary dressing to Jackson Southydrofera Blue on this. ABDs and 4 layer compression to continue 2. He is completed the clindamycin the cellulitis seems resolved 3. In talking to the patient to would appear that he has a proximal pelvic obstruction to the pelvic drainage on the left question common iliac vein based on what he is saying. I could not see the CT scan on [place] link he is apparently being planned for an attempt at stenting this area. Reason for guarded optimism to prevent recurrent wounds in his left lower leg. 4. He also has a IVC stent and apparently Dr. Trula OreMarsden is referring his chart to a colleague who might consider removing this Electronic Signature(s) Signed: 07/29/2019 5:43:25 PM By: [name] Najjarobson,  MD Entered By: [name] Najjarobson,  on 07/29/2019 11:11:35 -------------------------------------------------------------------------------- SuperBill Details Patient Name: Date of Service: [name], [name] 07/29/2019 Medical Record [phone]:[id number] Patient Account Number: [id number] Date of Birth/Sex: Treating RN: 1976-08-09 (43 y.o. [name] [place]M) Epps,  Carrie Primary Care Provider: Docia ChuckKoirala, [name] Other Clinician: Karl Itoawkins, Destiny Referring Provider: Treating Provider/Extender:, [name] Koirala, [name] Weeks in Treatment: 13 Diagnosis Coding ICD-10 Codes Code Description I87.322 Chronic venous hypertension (idiopathic) with inflammation of left lower extremity L97.821 Non-pressure chronic ulcer of other part of left lower leg limited to breakdown of skin I89.0 Lymphedema, not elsewhere classified D68.69 Other thrombophilia L03.116 Cellulitis of left lower limb Facility Procedures CPT4 Code Description: [id number] 11042 - DEB SUBQ TISSUE 20 SQ CM/< ICD-10 Diagnosis Description L97.821 Non-pressure chronic ulcer of other part of left lower leg limit skin I87.322 Chronic venous hypertension (idiopathic) with inflammation of le Modifier: ed to breakdown ft lower extremi Quantity: 1 of ty Physician Procedures CPT4 Code Description: [id number] 11042 - WC PHYS SUBQ TISS 20 SQ CM ICD-10 Diagnosis Description L97.821 Non-pressure chronic ulcer of other part of left lower l skin I87.322 Chronic venous hypertension (idiopathic) with inflammati Modifier: eg limited to b on of left lowe Quantity: 1 reakdown of r extremity Electronic Signature(s) Signed: 07/29/2019 5:43:25 PM By: [name] Najjarobson,  MD Entered By: [name] Najjarobson,  on 07/29/2019 11:12:02

## 2019-10-08 NOTE — Progress Notes (Signed)
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

## 2019-10-08 NOTE — Progress Notes (Signed)
OBALOLUWA, DELATTE ([id number]) Visit Report for 09/02/2019 Arrival Information Details Patient Name: Date of Service: TAVIEN, CHESTNUT 09/02/2019 9:00 AM Medical Record [id number] Patient Account Number: [id number] Date of Birth/Sex: Treating RN: 20-Aug-1976 (43 y.o. [name] Primary Care Ozias Dicenzo: [name], [name] Other Clinician: Referring Hanif Radin: Treating Kaiyla Stahly/Extender:[name], [name], [name] in Treatment: 18 Visit Information History Since Last Visit Added or deleted any medications: No Patient Arrived: Ambulatory Any new allergies or adverse reactions: No Arrival Time: 09:11 Had a fall or experienced change in No Accompanied By: self activities of daily living that may affect Transfer Assistance: None risk of falls: Patient Requires Transmission-Based No Signs or symptoms of abuse/neglect since last No Precautions: visito Patient Has Alerts: No Hospitalized since last visit: No Implantable device outside of the clinic excluding No cellular tissue based products placed in the center since last visit: Has Dressing in Place as Prescribed: Yes Has Compression in Place as Prescribed: Yes Pain Present Now: Yes Electronic Signature(s) Signed: 09/02/2019 6:11:25 PM By: Baruch Gouty RN, BSN Entered By: Baruch Gouty on 09/02/2019 09:15:03 -------------------------------------------------------------------------------- Compression Therapy Details Patient Name: Date of Service: VALENTIN, BENNEY 09/02/2019 9:00 AM Medical Record [id number] Patient Account Number: [id number] Date of Birth/Sex: Treating RN: 03/07/1976 (43 y.o. Oval Linsey Primary Care Satina Jerrell: [name], [name] Other Clinician: Referring Lovely Kerins: Treating Eulalio Reamy/Extender:[name], [name], [name] in Treatment: 18 Compression Therapy Performed for Wound Wound #9RR Left,Anterior Lower Leg Assessment: Performed By: [name] Church, RN Compression Type: Four  Layer Post Procedure Diagnosis Same as Pre-procedure Electronic Signature(s) Signed: 10/08/2019 12:05:18 PM By: Carlene Coria RN Entered By: Carlene Coria on 09/02/2019 09:46:16 -------------------------------------------------------------------------------- Encounter Discharge Information Details Patient Name: Date of Service: SI, JACHIM 09/02/2019 9:00 AM Medical Record [id number] Patient Account Number: [id number] Date of Birth/Sex: Treating RN: [birth year] (43 y.o. [name] Primary Care Dorsey Charette: [name], [name] Other Clinician: Referring Shenna Brissette: Treating Jinger Middlesworth/Extender:[name], [name], [name] in Treatment: 18 Encounter Discharge Information Items Discharge Condition: Stable Ambulatory Status: Ambulatory Discharge Destination: Home Transportation: Private Auto Accompanied By: self Schedule Follow-up Appointment: Yes Clinical Summary of Care: Patient Declined Electronic Signature(s) Signed: 09/04/2019 5:16:49 PM By: Kela Millin Entered By: Kela Millin on 09/02/2019 10:02:31 -------------------------------------------------------------------------------- Lower Extremity Assessment Details Patient Name: Date of Service: KASTIN, CERDA 09/02/2019 9:00 AM Medical Record [id number] Patient Account Number: [id number] Date of Birth/Sex: Treating RN: 1976-02-24 (43 y.o. [name] Primary Care Kayley Zeiders: [name], [name] Other Clinician: Referring Rocklyn Mayberry: Treating Yizel Canby/Extender:[name], [name], [name] in Treatment: 18 Edema Assessment Assessed: [Left: No] [Right: No] Edema: [Left: Ye] [Right: s] Calf Left: Right: Point of Measurement: 35 cm From Medial Instep 42.8 cm cm Ankle Left: Right: Point of Measurement: 11.5 cm From Medial Instep 24.5 cm cm Vascular Assessment Pulses: Dorsalis Pedis Palpable: [Left:Yes] Electronic Signature(s) Signed: 09/02/2019 6:11:25 PM By: Baruch Gouty RN, BSN Entered  By: Baruch Gouty on 09/02/2019 09:26:16 -------------------------------------------------------------------------------- Multi Wound Chart Details Patient Name: Date of Service: Hughston, Lui 09/02/2019 9:00 AM Medical Record [id number] Patient Account Number: [id number] Date of Birth/Sex: Treating RN: 12-May-1976 (43 y.o. Oval Linsey Primary Care Dianne Bady: [name], [name] Other Clinician: Referring Kru Allman: Treating Abraham Margulies/Extender:[name], [name], [name] in Treatment: 18 Vital Signs Height(in): 74 Pulse(bpm): 78 Weight(lbs): 325 Blood Pressure(mmHg): 138/85 Body Mass Index(BMI): 42 Temperature(F): 98.3 Respiratory 18 Rate(breaths/min): Photos: [9RR:No Photos] [N/A:N/A] Wound Location: [9RR:Left Lower Leg - Anterior] [N/A:N/A] Wounding Event: [9RR:Gradually Appeared] [N/A:N/A] Primary Etiology: [9RR:Venous Leg Ulcer] [N/A:N/A] Comorbid History: [9RR:Asthma, Sleep Apnea, DeepN/A Vein Thrombosis, Phlebitis, Osteoarthritis, Neuropathy] Date Acquired: [9RR:03/31/2019] [N/A:N/A]  Weeks of Treatment: [9RR:18] [N/A:N/A] Wound Status: [9RR:Open] [N/A:N/A] Wound Recurrence: [9RR:Yes] [N/A:N/A] Measurements L x W x D 3x0.6x0.2 [N/A:N/A] (cm) Area (cm) : [9RR:1.414] [N/A:N/A] Volume (cm) : [9RR:0.283] [N/A:N/A] % Reduction in Area: [9RR:89.30%] [N/A:N/A] % Reduction in Volume: [9RR:78.50%] [N/A:N/A] Classification: [9RR:Full Thickness Without Exposed Support Structures] [N/A:N/A] Exudate Amount: [9RR:Medium] [N/A:N/A] Exudate Type: [9RR:Serous] [N/A:N/A] Exudate Color: [9RR:amber] [N/A:N/A] Wound Margin: [9RR:Flat and Intact] [N/A:N/A] Granulation Amount: [9RR:Large (67-100%)] [N/A:N/A] Granulation Quality: [9RR:Red, Pink] [N/A:N/A] Necrotic Amount: [9RR:None Present (0%)] [N/A:N/A] Exposed Structures: [9RR:Fat Layer (Subcutaneous N/A Tissue) Exposed: Yes Fascia: No Tendon: No Muscle: No Joint: No Bone: No] Epithelialization: [9RR:Small (1-33%)  Compression Therapy] [N/A:N/A N/A] Treatment Notes Wound #9RR (Left, Anterior Lower Leg) 1. Cleanse With Wound Cleanser Soap and water 2. Periwound Care TCA Ointment 3. Primary Dressing Applied Hydrogel or K-Y Jelly Hydrofera Blue 4. Secondary Dressing Dry Gauze 6. Support Layer Applied 4 layer compression Water quality scientist) Signed: 09/02/2019 6:03:12 PM By: Linton Ham MD Signed: 10/08/2019 12:05:18 PM By: Carlene Coria RN Entered By: Linton Ham on 09/02/2019 10:06:01 -------------------------------------------------------------------------------- Multi-Disciplinary Care Plan Details Patient Name: Date of Service: BEULAH, MATUSEK 09/02/2019 9:00 AM Medical Record [id number] Patient Account Number: [id number] Date of Birth/Sex: Treating RN: 20-Feb-1976 (43 y.o. Oval Linsey Primary Care Marlene Beidler: [name], [name] Other Clinician: Referring Lynette Topete: Treating Soley Harriss/Extender:[name], [name], [name] in Treatment: 18 Active Inactive Wound/Skin Impairment Nursing Diagnoses: Knowledge deficit related to ulceration/compromised skin integrity Goals: Patient/caregiver will verbalize understanding of skin care regimen Date Initiated: 07/22/2019 Target Resolution Date: 09/19/2019 Goal Status: Active Ulcer/skin breakdown will have a volume reduction of 30% by week 4 Date Inactivated: 08/26/2019 Target10/23/2020 Resolution Date Initiated: 07/22/2019 Date: Goal Status: Met Ulcer/skin breakdown will have a volume reduction of 50% by week 8 Date Initiated: 08/26/2019 Target Resolution Date: 09/19/2019 Goal Status: Active Interventions: Assess patient/caregiver ability to obtain necessary supplies Assess patient/caregiver ability to perform ulcer/skin care regimen upon admission and as needed Assess ulceration(s) every visit Notes: Electronic Signature(s) Signed: 10/08/2019 12:05:18 PM By: Carlene Coria RN Entered By: Carlene Coria on 09/02/2019  09:28:20 -------------------------------------------------------------------------------- Pain Assessment Details Patient Name: Date of Service: DELEON, PASSE 09/02/2019 9:00 AM Medical Record [id number] Patient Account Number: [id number] Date of Birth/Sex: Treating RN: July 03, 1976 (43 y.o. [name] Primary Care Ashlin Hidalgo: [name], [name] Other Clinician: Referring Shaterica Mcclatchy: Treating Stefana Lodico/Extender:[name], [name], [name] in Treatment: 18 Active Problems Location of Pain Severity and Description of Pain Patient Has Paino Yes Site Locations Pain Location: Generalized Pain, Pain in Ulcers With Dressing Change: Yes Duration of the Pain. Constant / Intermittento Constant Rate the pain. Current Pain Level: 4 Worst Pain Level: 8 Least Pain Level: 3 Character of Pain Describe the Pain: Aching Pain Management and Medication Current Pain Management: Medication: Yes Is the Current Pain Management Adequate: Adequate Rest: Yes How does your wound impact your activities of daily livingo Sleep: No Bathing: No Appetite: No Relationship With Others: No Bladder Continence: No Emotions: No Bowel Continence: No Work: Yes Toileting: No Drive: No Dressing: No Hobbies: No Engineer, maintenance) Signed: 09/02/2019 6:11:25 PM By: Baruch Gouty RN, BSN Entered By: Baruch Gouty on 09/02/2019 09:23:32 -------------------------------------------------------------------------------- Patient/Caregiver Education Details Patient Name: Date of Service: [name] 11/3/2020andnbsp9:00 AM Medical Record [id number] Patient Account Number: [id number] Date of Birth/Gender: 10/16/1976 (43 y.o. M) Treating RN: Carlene Coria Primary Care Physician: [name], [name] Other Clinician: Referring Physician: Treating Physician/Extender:[name], [name], [name] in Treatment: 59 Education Assessment Education Provided To: Patient Education Topics  Provided Wound/Skin Impairment: Methods: Explain/Verbal Responses:  State content correctly Electronic Signature(s) Signed: 10/08/2019 12:05:18 PM By: Carlene Coria RN Entered By: Carlene Coria on 09/02/2019 09:28:33 -------------------------------------------------------------------------------- Wound Assessment Details Patient Name: Date of Service: TAIMUR, FIER 09/02/2019 9:00 AM Medical Record [id number] Patient Account Number: [id number] Date of Birth/Sex: Treating RN: 1976/08/17 (43 y.o. [name]) Carlene Coria Primary Care Dave Mannes: [name], [name] Other Clinician: Referring Cathye Kreiter: Treating Jurell Basista/Extender:[name], [name], [name] in Treatment: 18 Wound Status Wound Number: [id number] Primary Venous Leg Ulcer Etiology: Wound Location: Left Lower Leg - Anterior, Proximal Wound Healed - Epithelialized Wounding Event: Gradually Appeared Status: Date Acquired: 05/22/2019 Comorbid Asthma, Sleep Apnea, Deep Vein Weeks Of Treatment: 14 History: Thrombosis, Phlebitis, Osteoarthritis, Clustered Wound: Yes Neuropathy Photos Wound Measurements Length: (cm) 0 % Reduc Width: (cm) 0 % Reduc Depth: (cm) 0 Epithel Clustered Quantity: 4 Area: (cm) 0 Volume: (cm) 0 Wound Description Classification: Full Thickness Without Exposed Support Foul Od Structures Slough/ Wound Flat and Intact Margin: Exudate None Present Amount: Wound Bed Granulation Amount: None Present (0%) Necrotic Amount: None Present (0%) Fascia E Fat Laye Tendon E Muscle E Joint Ex Bone Exp or After Cleansing: No Fibrino No Exposed Structure xposed: No r (Subcutaneous Tissue) Exposed: No xposed: No xposed: No posed: No osed: No tion in Area: 100% tion in Volume: 100% ialization: Large (67-100%) Electronic Signature(s) Signed: 09/04/2019 4:13:56 PM By: Mikeal Hawthorne EMT/HBOT Signed: 10/08/2019 12:05:18 PM By: Carlene Coria RN Entered By: Mikeal Hawthorne on 09/04/2019  11:18:03 -------------------------------------------------------------------------------- Wound Assessment Details Patient Name: Date of Service: Cinquemani, Maxamilian 09/02/2019 9:00 AM Medical Record [id number] Patient Account Number: [id number] Date of Birth/Sex: Treating RN: 1975/12/30 (43 y.o. [name] Primary Care Christal Lagerstrom: [name], [name] Other Clinician: Referring Channon Ambrosini: Treating Tayden Nichelson/Extender:[name], [name], [name] in Treatment: 18 Wound Status Wound Number: [id number] Primary Venous Leg Ulcer Etiology: Wound Location: Left Lower Leg - Anterior Wound Open Wounding Event: Gradually Appeared Status: Date Acquired: 03/31/2019 Comorbid Asthma, Sleep Apnea, Deep Vein Thrombosis, Weeks Of Treatment: 18 History: Phlebitis, Osteoarthritis, Neuropathy Clustered Wound: No Photos Wound Measurements Length: (cm) 3 % Reduct Width: (cm) 0.6 % Reduct Depth: (cm) 0.2 Epitheli Area: (cm) 1.414 Tunneli Volume: (cm) 0.283 Undermi Wound Description Classification: Full Thickness Without Exposed Support Foul Odo Structures Slough/F Wound Flat and Intact Margin: Exudate Medium Amount: Exudate Serous Type: Exudate amber Color: Wound Bed Granulation Amount: Large (67-100%) Granulation Quality: Red, Pink Fascia Ex Necrotic Amount: None Present (0%) Fat Layer Tendon Ex Muscle Ex Joint Exp Bone Expo r After Cleansing: No ibrino No Exposed Structure posed: No (Subcutaneous Tissue) Exposed: Yes posed: No posed: No osed: No sed: No ion in Area: 89.3% ion in Volume: 78.5% alization: Small (1-33%) ng: No ning: No Electronic Signature(s) Signed: 09/04/2019 4:13:56 PM By: Mikeal Hawthorne EMT/HBOT Signed: 09/04/2019 6:27:24 PM By: Baruch Gouty RN, BSN Previous Signature: 09/02/2019 6:11:25 PM Version By: Baruch Gouty RN, BSN Entered By: Mikeal Hawthorne on 09/04/2019  11:17:15 -------------------------------------------------------------------------------- Vitals Details Patient Name: Date of Service: Leh, Bernal 09/02/2019 9:00 AM Medical Record [id number] Patient Account Number: [id number] Date of Birth/Sex: Treating RN: 13-Dec-1975 (43 y.o. [name] Primary Care Jermel Artley: [name], [name] Other Clinician: Referring Keaunna Skipper: Treating Tino Ronan/Extender:[name], [name], [name] in Treatment: 18 Vital Signs Time Taken: 09:15 Temperature (F): 98.3 Height (in): 74 Pulse (bpm): 84 Source: Stated Respiratory Rate (breaths/min): 18 Weight (lbs): 325 Blood Pressure (mmHg): 138/85 Source: Stated Reference Range: 80 - 120 mg / dl Body Mass Index (BMI): 41.7 Electronic Signature(s) Signed: 09/02/2019 6:11:25 PM By: Baruch Gouty RN, BSN Entered  By: Baruch Gouty on 09/02/2019 09:22:36

## 2019-10-10 ENCOUNTER — Encounter (HOSPITAL_BASED_OUTPATIENT_CLINIC_OR_DEPARTMENT_OTHER): Payer: Medicare HMO | Admitting: Internal Medicine

## 2019-10-10 ENCOUNTER — Other Ambulatory Visit: Payer: Self-pay

## 2019-10-10 DIAGNOSIS — L97521 Non-pressure chronic ulcer of other part of left foot limited to breakdown of skin: Secondary | ICD-10-CM | POA: Diagnosis not present

## 2019-10-13 NOTE — Progress Notes (Signed)
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 the other direction at some point. He sees him in 2-1/2-week 11/3; quite an improvement in wound surface area this week. We are using Hydrofera Blue. No need to change the dressing. Follows up with Dr. Trula Ore in about a week and a half 11/10; wound surface not as good this week at all. No changes in dimensions. We have been using Hydrofera Blue. As well he had tells [place] he had discomfort in his foot all week although he admits he was up  on this more than usual.  He comes in today with 2 small punched out areas on the left lateral foot. He has severe venous hypertension. He sees Dr. Gladys Damme again on 11/16 11/17; not much change in the area on the anterior tibia mid aspect. He also has 2 areas on the left lateral foot. We have been using Sorbact on the left anterior and Iodosorb ointment on the left foot. He is under 4-layer compression. The patient went to see Dr. Gladys Damme I do not yet have access to this note and care everywhere. However according to the patient there is an option to try and address his venous occlusion I think via a transjugular approach. This is complicated by the fact that the IVC filter is there and would have to be traversed. According to the patient there is about a 33% chance of perforation may be even aortic perforation. However they would be prepared for this. He is thinking about this and discussing it with his wife Thanks to our case manager this week we are able to determine if for some reason the patient is not using his compression pumps. He also is not able to get stockings on the right leg which are 30/40 below-knee. I think we may need to order external compression garments 12/1; patient arrives with the area on the left anterior mid tibia expanded superiorly small rim of skin between the original area and the new area. He has painful small denuded areas on the left medial and left lateral heel. We have been using Iodoflex. His next appointment with the Dr. Gladys Damme is January 4. after the holidays to discuss if he would like to pursue this endovascular option. Dr. Matilde Haymaker notes below from last visit Subjective Gresham Caetano is a 43 y.o. 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 Unfortunately  during the procedure a stent was not able to be placed d/t narrowing at the IVC. Per the patient the procedure provided 2-3 days of symptomatic relief (decrease in L LE pressure and pain) but after that his symptoms returned with increased pressure, pain in the hips and pain in the legs when standing. The patient does report his L LE wound is stable especially when he is consistently attending his wound care clinic in [place]. The patient denies using compressive LE therapies unless done at his wound clinic due to not being able to reach his legs. He is interested in learning more and possibly pursing further interventional options for his L lower extremity via access through neck. *Objective Physical Exam: BP 171/86  Pulse 80  Temp 37 C (Temporal)  Wt (!) 147.4 kg (325 lb)  BMI 41.73 kg/m General: awake, alert and oriented x 3 Chest: Non labored breathing on room air CVS: Normal rate. Regular rhythm. ABD: soft, NT, ND Ext: bilateral leg edema w/ left leg in compressive dressing from wound clinic. Evidence of skin discoloration on both feet bilaterally. Data Review: Labs: None to review Imaging:Reviewed report of 10/15 L LE venogram. I saw and evaluated the patient, participating in the key portions of the service. I reviewed the residentoos note. I agree with the residentoos findings and plan. Electronically signed by Lauretta Grill, MD at 09/16/2019 10:53 AM EST 12/11 the areas around his ankles have healed. 2 areas  in the center part of the mid tibia area are still open we have been using silver collagen. Procedure attempt by Dr. [facility]ula [facility]re at [facility] on 11/03/2019 Objective Constitutional Patient is hypertensive.. Pulse regular and within target range for patient.Marland Kitchen Respirations regular, non-labored and within target range.. Temperature is normal and within the target range for the patient.Marland Kitchen Appears in no distress. Vitals Time Taken: 1:43 PM, Height: 74 in, Weight: 325  lbs, BMI: 41.7, Temperature: 98.6 F, Pulse: 76 bpm, Respiratory Rate: 18 breaths/min, Blood Pressure: 153/104 mmHg. General Notes: Wound exam; superficial area in the mid tibia is the only wound that remains. He has a rim of skin between the toes with 2 lobes of the figure 8. Wound surface looks healthy. Chronic venous inflammation/stasis dermatitis around the wound. Edema control in his leg is reasonable. Integumentary (Hair, Skin) Wound #12 status is Healed - Epithelialized. Original cause of wound was Gradually Appeared. The wound is located on the Left,Lateral Foot. The wound measures 0cm length x 0cm width x 0cm depth; 0cm^2 area and 0cm^3 volume. Wound #13 status is Open. Original cause of wound was Gradually Appeared. The wound is located on the Left,Proximal,Anterior Lower Leg. The wound measures 1cm length x 1.3cm width x 0.1cm depth; 1.021cm^2 area and 0.102cm^3 volume. Wound #9RR status is Open. Original cause of wound was Gradually Appeared. The wound is located on the Left,Anterior Lower Leg. The wound measures 1.3cm length x 1.2cm width x 0.2cm depth; 1.225cm^2 area and 0.245cm^3 volume. 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 There was a Four Layer Compression Therapy Procedure by Shawn Stall, RN. Post procedure Diagnosis Wound #13: Same as Pre-Procedure Wound #9RR Pre-procedure diagnosis of Wound #9RR is a Venous Leg Ulcer located on the Left,Anterior Lower Leg . There was a Four Layer Compression Therapy Procedure by Shawn Stall,  RN. Post procedure Diagnosis Wound #9RR: Same as Pre-Procedure Plan Follow-up Appointments: Return Appointment in 1 week. Dressing Change Frequency: Wound #9RR Left,Anterior Lower Leg: Do not change entire dressing for one week. Skin Barriers/Peri-Wound Care: TCA Cream or Ointment Wound Cleansing: May shower with protection. Primary Wound Dressing: Wound #13 Left,Proximal,Anterior Lower Leg: Silver Collagen - moisten with hydrogel Wound #9RR Left,Anterior Lower Leg: Silver Collagen - moisten with hydrogel Secondary Dressing: Wound #13 Left,Proximal,Anterior Lower Leg: Dry Gauze ABD pad Wound #9RR Left,Anterior Lower Leg: Dry Gauze ABD pad Edema Control: 4 layer compression: Left lower extremity Avoid standing for long periods of time Elevate legs to the level of the heart or above for 30 minutes daily and/or when sitting, a frequency of: Support Garment 30-40 mm/Hg pressure to: - right leg : apply compression stockings dual layer in the am and remove at night. Segmental Compressive Device. - lymphedema pumps 1 hour 2 times per day. 1. TCA 2. Continue silver collagen 3. Usual 4-layer compression Electronic Signature(s) Signed: 10/10/2019 6:34:01 PM By: Baltazar Najjar MD Entered By: Baltazar Najjar on 10/10/2019 14:28:56 -------------------------------------------------------------------------------- SuperBill Details Patient Name: Date of Service: QUANAH, MAJKA 10/10/2019 Medical Record [id number] Patient Account Number: [id number] Date of Birth/Sex: Treating RN: 1976/01/20 (43 y.o. 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

## 2019-10-13 NOTE — Progress Notes (Signed)
[name], [name] ([id number]) Visit Report for 10/10/2019 Arrival Information Details Patient Name: Date of Service: [name], [name] 10/10/2019 1:15 PM Medical Record [id number] Patient Account Number: [id number] Date of Birth/Sex: Treating RN: 1976/10/03 (43 y.o. [name]) Carlene Coria Primary Care Provider: [name], [name] Other Clinician: Referring Provider: Treating Provider/Extender:[name], [name], [name] in Treatment: 24 Visit Information History Since Last Visit All ordered tests and consults were completed: No Patient Arrived: Ambulatory Added or deleted any medications: No Arrival Time: 13:43 Any new allergies or adverse reactions: No Accompanied By: self Had a fall or experienced change in No Transfer Assistance: None activities of daily living that may affect Patient Identification Verified: Yes risk of falls: Secondary Verification Process Completed: Yes Signs or symptoms of abuse/neglect since last No Patient Requires Transmission-Based No visito Precautions: Hospitalized since last visit: No Patient Has Alerts: No Implantable device outside of the clinic excluding No cellular tissue based products placed in the center since last visit: Has Dressing in Place as Prescribed: Yes Pain Present Now: No Electronic Signature(s) Signed: 10/10/2019 5:42:46 PM By: Carlene Coria RN Entered By: Carlene Coria on 10/10/2019 13:43:32 -------------------------------------------------------------------------------- Compression Therapy Details Patient Name: Date of Service: [name], [name] 10/10/2019 1:15 PM Medical Record [id number] Patient Account Number: [id number] Date of Birth/Sex: Treating RN: Mar 11, 1976 (43 y.o. [name] Primary Care Provider: [name], [name] Other Clinician: Referring Provider: Treating Provider/Extender:[name], [name], [name] in Treatment: 24 Compression Therapy Performed for Wound Wound #13  Left,Proximal,Anterior Lower Leg Assessment: Performed By: Clinician Deon Pilling, RN Compression Type: Four Layer Post Procedure Diagnosis Same as Pre-procedure Electronic Signature(s) Signed: 10/13/2019 5:56:42 PM By: Kela Millin Entered By: Kela Millin on 10/10/2019 14:12:52 -------------------------------------------------------------------------------- Compression Therapy Details Patient Name: Date of Service: [name], [name] 10/10/2019 1:15 PM Medical Record [id number] Patient Account Number: [id number] Date of Birth/Sex: Treating RN: 01-24-1976 (43 y.o. [name] Primary Care Provider: [name], [name] Other Clinician: Referring Provider: Treating Provider/Extender:[name], [name], [name] in Treatment: 24 Compression Therapy Performed for Wound Wound #9RR Left,Anterior Lower Leg Assessment: Performed By: Clinician Deon Pilling, RN Compression Type: Four Layer Post Procedure Diagnosis Same as Pre-procedure Electronic Signature(s) Signed: 10/13/2019 5:56:42 PM By: Kela Millin Entered By: Kela Millin on 10/10/2019 14:12:52 -------------------------------------------------------------------------------- Encounter Discharge Information Details Patient Name: Date of Service: [name], [name] 10/10/2019 1:15 PM Medical Record [id number] Patient Account Number: [id number] Date of Birth/Sex: Treating RN: June 06, 1976 (43 y.o. [name] Primary Care Provider: [name], [name] Other Clinician: Referring Provider: Treating Provider/Extender:[name], [name], [name] in Treatment: 24 Encounter Discharge Information Items Discharge Condition: Stable Ambulatory Status: Ambulatory Discharge Destination: Home Transportation: Private Auto Accompanied By: self Schedule Follow-up Appointment: Yes Clinical Summary of Care: Electronic Signature(s) Signed: 10/10/2019 6:25:32 PM By: Deon Pilling Entered By:  Deon Pilling on 10/10/2019 14:38:46 -------------------------------------------------------------------------------- Lower Extremity Assessment Details Patient Name: Date of Service: [name], [name] 10/10/2019 1:15 PM Medical Record [id number] Patient Account Number: [id number] Date of Birth/Sex: Treating RN: 12-09-1975 (43 y.o. [name]) Carlene Coria Primary Care Provider: [name], [name] Other Clinician: Referring Provider: Treating Provider/Extender:[name], [name], [name] in Treatment: 24 Edema Assessment Assessed: [Left: No] [Right: No] Edema: [Left: Ye] [Right: s] Calf Left: Right: Point of Measurement: 35 cm From Medial Instep 43.7 cm cm Ankle Left: Right: Point of Measurement: 11.5 cm From Medial Instep 24 cm cm Electronic Signature(s) Signed: 10/10/2019 5:42:46 PM By: Carlene Coria RN Entered By: Carlene Coria on 10/10/2019 13:45:04 -------------------------------------------------------------------------------- Multi Wound Chart Details Patient Name: Date of Service: [name], [name] 10/10/2019 1:15 PM Medical Record [id number]  Patient Account Number: [id number] Date of Birth/Sex: Treating RN: 1976-05-05 (43 y.o. M) Primary Care Clerence Gubser: [name], [name] Other Clinician: Referring Loyce Klasen: Treating Aldair Rickel/Extender:[name], [name], [name] in Treatment: 24 Vital Signs Height(in): 74 Pulse(bpm): 76 Weight(lbs): 325 Blood Pressure(mmHg): 153/104 Body Mass Index(BMI): 42 Temperature(F): 98.6 Respiratory 18 Rate(breaths/min): Photos: [12:No Photos] [13:No Photos] [9RR:No Photos] Wound Location: [12:Left Foot - Lateral Left, Proximal, Anterior Left, Anterior Lower Leg] [13:Lower Leg] Wounding Event: [12:Gradually Appeared] [13:Gradually Appeared] [9RR:Gradually Appeared] Primary Etiology: [12:Venous Leg Ulcer] [13:Venous Leg Ulcer] [9RR:Venous Leg Ulcer] Comorbid History: [12:Asthma, Sleep Apnea, DeepN/A Vein Thrombosis, Phlebitis,  Osteoarthritis, Neuropathy] [9RR:N/A] Date Acquired: [12:09/09/2019] [13:09/23/2019] [9RR:03/31/2019] Weeks of Treatment: [12:4] [13:1] [9RR:24] Wound Status: [12:Healed - Epithelialized] [13:Open] [9RR:Open] Wound Recurrence: [12:No] [13:No] [9RR:Yes] Clustered Wound: [12:Yes] [13:No] [9RR:No] Measurements L x W x D 0x0x0 [13:1x1.3x0.1] [9RR:1.3x1.2x0.2] (cm) Area (cm) : [12:0] [13:1.021] [9RR:1.225] Volume (cm) : [12:0] [13:0.102] [9RR:0.245] % Reduction in Area: [12:100.00%] [13:49.00%] [9RR:90.70%] % Reduction in Volume: 100.00% [13:49.00%] [9RR:81.40%] Classification: [12:Full Thickness Without Exposed Support Structures Exposed Support Structures Exposed Support Structures] [13:Full Thickness Without Compression Therapy] [9RR:Full Thickness Without Compression Therapy] Treatment Notes Electronic Signature(s) Signed: 10/10/2019 6:34:01 PM By: Linton Ham MD Entered By: Linton Ham on 10/10/2019 14:23:19 -------------------------------------------------------------------------------- Multi-Disciplinary Care Plan Details Patient Name: Date of Service: [name], [name] 10/10/2019 1:15 PM Medical Record [id number] Patient Account Number: [id number] Date of Birth/Sex: Treating RN: 02-06-1976 (43 y.o. [name] Primary Care Jaxiel Kines: [name], [name] Other Clinician: Referring Chena Chohan: Treating Daryl Quiros/Extender:[name], [name], [name] in Treatment: 24 Active Inactive Wound/Skin Impairment Nursing Diagnoses: Knowledge deficit related to ulceration/compromised skin integrity Goals: Patient/caregiver will verbalize understanding of skin care regimen Date Initiated: 07/22/2019 Target Resolution Date: 10/17/2019 Goal Status: Active Ulcer/skin breakdown will have a volume reduction of 30% by week 4 Date Inactivated: 08/26/2019 Target Resolution Date Initiated: 07/22/2019 Date: 08/22/2019 Goal Status: Met Ulcer/skin breakdown will have a volume  reduction of 50% by week 8 Date Initiated: 08/26/2019 Date Inactivated: 09/30/2019 Target Resolution Date: 09/19/2019 Goal Status: Unmet Unmet Reason: comorbities Ulcer/skin breakdown will have a volume reduction of 80% by week 12 Date Initiated: 09/30/2019 Target Resolution Date: 10/20/2019 Goal Status: Active Interventions: Assess patient/caregiver ability to obtain necessary supplies Assess patient/caregiver ability to perform ulcer/skin care regimen upon admission and as needed Assess ulceration(s) every visit Notes: Electronic Signature(s) Signed: 10/13/2019 5:56:42 PM By: Kela Millin Entered By: Kela Millin on 10/10/2019 14:09:15 -------------------------------------------------------------------------------- Pain Assessment Details Patient Name: Date of Service: [name], [name] 10/10/2019 1:15 PM Medical Record [id number] Patient Account Number: [id number] Date of Birth/Sex: Treating RN: 24-Mar-1976 (43 y.o. Oval Linsey Primary Care Osman Calzadilla: [name], [name] Other Clinician: Referring Tamula Morrical: Treating Diego Ulbricht/Extender:[name], [name], [name] in Treatment: 24 Active Problems Location of Pain Severity and Description of Pain Patient Has Paino No Site Locations Pain Management and Medication Current Pain Management: Electronic Signature(s) Signed: 10/10/2019 5:42:46 PM By: Carlene Coria RN Entered By: Carlene Coria on 10/10/2019 13:44:57 -------------------------------------------------------------------------------- Patient/Caregiver Education Details Patient Name: Date of Service: [name] 12/11/2020andnbsp1:15 PM Medical Record [id number] Patient Account Number: [id number] Date of Birth/Gender: 12-05-1975 (43 y.o. M) Treating RN: Kela Millin Primary Care Physician: [name], [name] Other Clinician: Referring Physician: Treating Physician/Extender:[name], [name], [name] in Treatment: 24 Education  Assessment Education Provided To: Patient Education Topics Provided Wound/Skin Impairment: Methods: Explain/Verbal Responses: State content correctly Electronic Signature(s) Signed: 10/13/2019 5:56:42 PM By: Kela Millin Entered By: Kela Millin on 10/10/2019 14:09:32 -------------------------------------------------------------------------------- Wound Assessment Details Patient Name: Date of Service: [name], [name] 10/10/2019 1:15 PM Medical  Record [id number] Patient Account Number: [id number] Date of Birth/Sex: Treating RN: [birth year] (43 y.o. [name]) Carlene Coria Primary Care Provider: [name], [name] Other Clinician: Referring Provider: Treating Provider/Extender:[name], [name], [name] in Treatment: 24 Wound Status Wound Number: 12 Primary Venous Leg Ulcer Etiology: Wound Location: Left Foot - Lateral Wound Healed - Epithelialized Wounding Event: Gradually Appeared Status: Date Acquired: 09/09/2019 Comorbid Asthma, Sleep Apnea, Deep Vein Weeks Of Treatment: 4 History: Thrombosis, Phlebitis, Osteoarthritis, Clustered Wound: Yes Neuropathy Photos Wound Measurements Length: (cm) 0 Width: (cm) 0 Depth: (cm) 0 Area: (cm) 0 Volume: (cm) 0 Wound Description Full Thickness Without Exposed Suppor Classification: Structures % Reduction in Area: 100% % Reduction in Volume: 100% t Electronic Signature(s) Signed: 10/13/2019 3:44:56 PM By: Mikeal Hawthorne EMT/HBOT Signed: 10/13/2019 5:55:53 PM By: Carlene Coria RN Previous Signature: 10/10/2019 5:42:46 PM Version By: Carlene Coria RN Entered By: Mikeal Hawthorne on 10/13/2019 14:01:51 -------------------------------------------------------------------------------- Wound Assessment Details Patient Name: Date of Service: [name], [name] 10/10/2019 1:15 PM Medical Record [id number] Patient Account Number: [id number] Date of Birth/Sex: Treating RN: September 18, 1976 (43 y.o. [name]) Carlene Coria Primary Care  Provider: [name], [name] Other Clinician: Referring Provider: Treating Provider/Extender:[name], [name], [name] in Treatment: 24 Wound Status Wound Number: 13 Primary Venous Leg Ulcer Etiology: Wound Location: Left Lower Leg - Anterior, Proximal Wound Open Wounding Event: Gradually Appeared Status: Date Acquired: 09/23/2019 Comorbid Asthma, Sleep Apnea, Deep Vein Weeks Of Treatment: 1 History: Thrombosis, Phlebitis, Osteoarthritis, Clustered Wound: No Neuropathy Photos Wound Measurements Length: (cm) 1 % Reduc Width: (cm) 1.3 % Reduc Depth: (cm) 0.1 Epithel Area: (cm) 1.021 Volume: (cm) 0.102 Wound Description Full Thickness Without Exposed Support Classification: Structures Wound Flat and Intact Margin: Exudate Medium Amount: Exudate Sanguinous Type: Exudate red Color: Wound Bed Granulation Amount: Large (67-100%) Granulation Quality: Red Necrotic Amount: None Present (0%) Foul Odor After Cleansing: No Slough/Fibrino No Exposed Structure Fascia Exposed: No Fat Layer (Subcutaneous Tissue) Exposed: Yes Tendon Exposed: No Muscle Exposed: No Joint Exposed: No Bone Exposed: No tion in Area: 49% tion in Volume: 49% ialization: None Treatment Notes Wound #13 (Left, Proximal, Anterior Lower Leg) 1. Cleanse With Wound Cleanser Soap and water 2. Periwound Care TCA Ointment 3. Primary Dressing Applied Collegen AG Hydrogel or K-Y Jelly 4. Secondary Dressing ABD Pad Dry Gauze 6. Support Layer Applied 4 layer compression wrap Notes netting. Electronic Signature(s) Signed: 10/13/2019 3:44:56 PM By: Mikeal Hawthorne EMT/HBOT Signed: 10/13/2019 5:55:53 PM By: Carlene Coria RN Previous Signature: 10/10/2019 5:42:46 PM Version By: Carlene Coria RN Entered By: Mikeal Hawthorne on 10/13/2019 14:02:35 -------------------------------------------------------------------------------- Wound Assessment Details Patient Name: Date of Service: [name], [name]  10/10/2019 1:15 PM Medical Record [id number] Patient Account Number: [id number] Date of Birth/Sex: Treating RN: Dec 24, 1975 (43 y.o. [name]) Carlene Coria Primary Care Provider: [name], [name] Other Clinician: Referring Provider: Treating Provider/Extender:[name], [name], [name] in Treatment: 24 Wound Status Wound Number: [id number] Primary Venous Leg Ulcer Etiology: Wound Location: Left Lower Leg - Anterior Wound Open Wounding Event: Gradually Appeared Status: Date Acquired: 03/31/2019 Comorbid Asthma, Sleep Apnea, Deep Vein Thrombosis, Weeks Of Treatment: 24 History: Phlebitis, Osteoarthritis, Neuropathy Clustered Wound: No Photos Wound Measurements Length: (cm) 1.3 % Reducti Width: (cm) 1.2 % Reducti Depth: (cm) 0.2 Epitheli Area: (cm) 1.225 Volume: (cm) 0.245 Wound Description Classification: Full Thickness Without Exposed Support Foul Od Structures Slough/ Wound Flat and Intact Margin: Exudate Medium Amount: Exudate Sanguinous Type: Exudate red Color: Wound Bed Granulation Amount: Large (67-100%) Granulation Quality: Red Fascia Necrotic Amount: None Present (0%) Fat Lay Tendon Muscle Joint  E Bone Ex or After Cleansing: No Fibrino No Exposed Structure Exposed: No er (Subcutaneous Tissue) Exposed: Yes Exposed: No Exposed: No xposed: No posed: No on in Area: 90.7% on in Volume: 81.4% alization: None Treatment Notes Wound #9RR (Left, Anterior Lower Leg) 1. Cleanse With Wound Cleanser Soap and water 2. Periwound Care TCA Ointment 3. Primary Dressing Applied Collegen AG Hydrogel or K-Y Jelly 4. Secondary Dressing ABD Pad Dry Gauze 6. Support Layer Applied 4 layer compression wrap Notes netting. Electronic Signature(s) Signed: 10/13/2019 3:44:56 PM By: Mikeal Hawthorne EMT/HBOT Signed: 10/13/2019 5:55:53 PM By: Carlene Coria RN Previous Signature: 10/10/2019 5:42:46 PM Version By: Carlene Coria RN Entered By: Mikeal Hawthorne on 10/13/2019  14:12:25 -------------------------------------------------------------------------------- Vitals Details Patient Name: Date of Service: RICCO, DERSHEM 10/10/2019 1:15 PM Medical Record [id number] Patient Account Number: [id number] Date of Birth/Sex: Treating RN: 08/08/1976 (43 y.o. [name]) Carlene Coria Primary Care Provider: [name], [name] Other Clinician: Referring Provider: Treating Provider/Extender:[name], [name], [name] in Treatment: 24 Vital Signs Time Taken: 13:43 Temperature (F): 98.6 Height (in): 74 Pulse (bpm): 76 Weight (lbs): 325 Respiratory Rate (breaths/min): 18 Body Mass Index (BMI): 41.7 Blood Pressure (mmHg): 153/104 Reference Range: 80 - 120 mg / dl Electronic Signature(s) Signed: 10/10/2019 5:42:46 PM By: Carlene Coria RN Entered By: Carlene Coria on 10/10/2019 13:44:45

## 2019-10-17 ENCOUNTER — Other Ambulatory Visit: Payer: Self-pay

## 2019-10-17 ENCOUNTER — Encounter (HOSPITAL_BASED_OUTPATIENT_CLINIC_OR_DEPARTMENT_OTHER): Payer: Medicare HMO | Admitting: Internal Medicine

## 2019-10-17 DIAGNOSIS — L97521 Non-pressure chronic ulcer of other part of left foot limited to breakdown of skin: Secondary | ICD-10-CM | POA: Diagnosis not present

## 2019-10-17 NOTE — Progress Notes (Addendum)
[name], [name] ([id number]) Visit Report for 10/17/2019 Arrival Information Details Patient Name: Date of Service: [name], [name] 10/17/2019 1:15 PM Medical Record [id number] Patient Account Number: [id number] Date of Birth/Sex: Treating RN: 01/28/1976 (43 y.o. [name]) Carlene Coria Primary Care Cher Franzoni: [name], [name] Other Clinician: Referring Edell Mesenbrink: Treating Tsuneo Faison/Extender:[name], [name], [name] in Treatment: 25 Visit Information History Since Last Visit All ordered tests and consults were completed: No Patient Arrived: Ambulatory Added or deleted any medications: No Arrival Time: 13:46 Any new allergies or adverse reactions: No Accompanied By: self Had a fall or experienced change in No Transfer Assistance: None activities of daily living that may affect Patient Identification Verified: Yes risk of falls: Secondary Verification Process Completed: Yes Signs or symptoms of abuse/neglect since last No Patient Requires Transmission-Based No visito Precautions: Hospitalized since last visit: No Patient Has Alerts: No Implantable device outside of the clinic excluding No cellular tissue based products placed in the center since last visit: Has Dressing in Place as Prescribed: Yes Has Compression in Place as Prescribed: Yes Pain Present Now: No Electronic Signature(s) Signed: 10/17/2019 5:51:21 PM By: Carlene Coria RN Entered By: Carlene Coria on 10/17/2019 13:52:59 -------------------------------------------------------------------------------- Compression Therapy Details Patient Name: Date of Service: [name], [name] 10/17/2019 1:15 PM Medical Record [id number] Patient Account Number: [id number] Date of Birth/Sex: Treating RN: 05/28/1976 (43 y.o. [name] Primary Care Corita Allinson: [name], [name] Other Clinician: Referring Koula Venier: Treating Billie Trager/Extender:[name], [name], [name] in Treatment: 25 Compression Therapy  Performed for Wound Wound #9RR Left,Anterior Lower Leg Assessment: Performed By: Clinician Deon Pilling, RN Compression Type: Four Layer Post Procedure Diagnosis Same as Pre-procedure Electronic Signature(s) Signed: 10/17/2019 5:42:17 PM By: Kela Millin Entered By: Kela Millin on 10/17/2019 14:22:47 -------------------------------------------------------------------------------- Compression Therapy Details Patient Name: Date of Service: [name], [name] 10/17/2019 1:15 PM Medical Record [id number] Patient Account Number: [id number] Date of Birth/Sex: Treating RN: [birth year] (43 y.o. [name] Primary Care Shanaiya Bene: [name], [name] Other Clinician: Referring Kreig Parson: Treating Newel Oien/Extender:[name], [name], [name] in Treatment: 25 Compression Therapy Performed for Wound Wound #13 Left,Proximal,Anterior Lower Leg Assessment: Performed By: Clinician Deon Pilling, RN Compression Type: Four Layer Post Procedure Diagnosis Same as Pre-procedure Electronic Signature(s) Signed: 10/17/2019 5:42:17 PM By: Kela Millin Entered By: Kela Millin on 10/17/2019 14:22:47 -------------------------------------------------------------------------------- Encounter Discharge Information Details Patient Name: Date of Service: [name], [name] 10/17/2019 1:15 PM Medical Record [id number] Patient Account Number: [id number] Date of Birth/Sex: Treating RN: 1976/04/18 (43 y.o. [name] Primary Care Andersen Mckiver: [name], [name] Other Clinician: Referring Norvin Ohlin: Treating Kingston Shawgo/Extender:[name], [name], [name] in Treatment: 25 Encounter Discharge Information Items Post Procedure Vitals Discharge Condition: Stable Temperature (F): 98.7 Ambulatory Status: Ambulatory Pulse (bpm): 84 Discharge Destination: Home Respiratory Rate (breaths/min): 18 Transportation: Private Auto Blood Pressure (mmHg): 152/92 Accompanied By:  self Schedule Follow-up Appointment: Yes Clinical Summary of Care: Electronic Signature(s) Signed: 10/17/2019 5:50:42 PM By: Deon Pilling Entered By: Deon Pilling on 10/17/2019 14:59:59 -------------------------------------------------------------------------------- Lower Extremity Assessment Details Patient Name: Date of Service: [name], [name] 10/17/2019 1:15 PM Medical Record [id number] Patient Account Number: [id number] Date of Birth/Sex: Treating RN: 1976/03/04 (43 y.o. [name]) Carlene Coria Primary Care [name] Dileo: [name], [name] Other Clinician: Referring Rhodesia Stanger: Treating Doloros Kwolek/Extender:[name], [name], [name] in Treatment: 25 Edema Assessment Assessed: [Left: No] [Right: No] Edema: [Left: Ye] [Right: s] Calf Left: Right: Point of Measurement: 35 cm From Medial Instep 43 cm cm Ankle Left: Right: Point of Measurement: 11.5 cm From Medial Instep 24 cm cm Electronic Signature(s) Signed: 10/17/2019 5:51:21 PM By: Carlene Coria RN Entered  ByCarlene Coria on 10/17/2019 14:04:50 -------------------------------------------------------------------------------- Multi Wound Chart Details Patient Name: Date of Service: [name], [name] 10/17/2019 1:15 PM Medical Record [id number] Patient Account Number: [id number] Date of Birth/Sex: Treating RN: August 11, 1976 (43 y.o. M) Primary Care Yocelin Vanlue: [name], [name] Other Clinician: Referring Ervin Hensley: Treating Sanai Frick/Extender:[name], [name], [name] in Treatment: 25 Vital Signs Height(in): 74 Pulse(bpm): 84 Weight(lbs): 325 Blood Pressure(mmHg): 152/92 Body Mass Index(BMI): 42 Temperature(F): 98.7 Respiratory 18 Rate(breaths/min): Photos: [13:No Photos] [9RR:No Photos] [N/A:N/A] Wound Location: [13:Left Lower Leg - Anterior, Left Lower Leg - Anterior Proximal] [N/A:N/A] Wounding Event: [13:Gradually Appeared] [9RR:Gradually Appeared] [N/A:N/A] Primary Etiology: [13:Venous Leg Ulcer]  [9RR:Venous Leg Ulcer] [N/A:N/A] Comorbid History: [13:Asthma, Sleep Apnea, DeepAsthma, Sleep Apnea, DeepN/A Vein Thrombosis, Phlebitis, Vein Thrombosis, Phlebitis, Osteoarthritis, Neuropathy Osteoarthritis, Neuropathy] Date Acquired: [13:09/23/2019] [9RR:03/31/2019] [N/A:N/A] Weeks of Treatment: [13:2] [9RR:25] [N/A:N/A] Wound Status: [13:Open] [9RR:Open] [N/A:N/A] Wound Recurrence: [13:No] [9RR:Yes] [N/A:N/A] Measurements L x W x D 0.5x1x0.1 [9RR:2.7x0.9x0.1] [N/A:N/A] (cm) Area (cm) : [13:0.393] [2DX:4.128] [N/A:N/A] Volume (cm) : [78:6.767] [9RR:0.191] [N/A:N/A] % Reduction in Area: [13:80.40%] [9RR:85.50%] [N/A:N/A] % Reduction in Volume: 80.50% [9RR:85.50%] [N/A:N/A] Classification: [13:Full Thickness Without Exposed Support Structures Exposed Support Structures] [9RR:Full Thickness Without] [N/A:N/A] Exudate Amount: [13:Medium] [9RR:Medium] [N/A:N/A] Exudate Type: [13:Serosanguineous] [9RR:Sanguinous] [N/A:N/A] Exudate Color: [13:red, brown] [9RR:red] [N/A:N/A] Wound Margin: [13:Flat and Intact] [9RR:Flat and Intact] [N/A:N/A] Granulation Amount: [13:Large (67-100%)] [9RR:Large (67-100%)] [N/A:N/A] Granulation Quality: [13:Red] [9RR:Red] [N/A:N/A] Necrotic Amount: [13:None Present (0%)] [9RR:None Present (0%)] [N/A:N/A] Exposed Structures: [13:Fat Layer (Subcutaneous Fat Layer (Subcutaneous N/A Tissue) Exposed: Yes Fascia: No Tendon: No Muscle: No Joint: No Bone: No] [9RR:Tissue) Exposed: Yes Fascia: No Tendon: No Muscle: No Joint: No Bone: No] Epithelialization: [13:None] [9RR:None] [N/A:N/A] Debridement: [13:N/A] [9RR:Debridement - Excisional N/A] Pre-procedure [13:N/A] [9RR:14:22] [N/A:N/A] Verification/Time Out Taken: Pain Control: [13:N/A] [9RR:Other] [N/A:N/A] Tissue Debrided: [13:N/A] [9RR:Subcutaneous, Slough] [N/A:N/A] Level: [13:N/A] [9RR:Skin/Subcutaneous Tissue N/A] Debridement Area (sq cm):N/A [9RR:2.43] [N/A:N/A] Instrument: [13:N/A] [9RR:Curette]  [N/A:N/A] Bleeding: [13:N/A] [9RR:Minimum] [N/A:N/A] Hemostasis Achieved: [13:N/A] [9RR:Pressure] [N/A:N/A] Procedural Pain: [13:N/A] [9RR:0] [N/A:N/A] Post Procedural Pain: [13:N/A] [9RR:0] [N/A:N/A] Debridement Treatment N/A [9RR:Procedure was tolerated] [N/A:N/A] Response: [9RR:well] Post Debridement [13:N/A] [9RR:2.7x0.9x0.1] [N/A:N/A] Measurements L x W x D (cm) Post Debridement [13:N/A] [2CN:4.709] Volume: (cm) Procedures Performed: [13:Compression Therapy] [9RR:Compression Therapy Debridement] Treatment Notes Electronic Signature(s) Signed: 10/17/2019 6:01:02 PM By: Linton Ham MD Entered By: Linton Ham on 10/17/2019 14:27:57 -------------------------------------------------------------------------------- Multi-Disciplinary Care Plan Details Patient Name: Date of Service: AVRAJ, LINDROTH 10/17/2019 1:15 PM Medical Record [id number] Patient Account Number: [id number] Date of Birth/Sex: Treating RN: 01-30-1976 (43 y.o. [name] Primary Care Aina Rossbach: [name], [name] Other Clinician: Referring Sakeena Teall: Treating Aeneas Longsworth/Extender:[name], [name], [name] in Treatment: 25 Active Inactive Wound/Skin Impairment Nursing Diagnoses: Knowledge deficit related to ulceration/compromised skin integrity Goals: Patient/caregiver will verbalize understanding of skin care regimen Date Initiated: 07/22/2019 Target Resolution Date: 11/07/2019 Goal Status: Active Ulcer/skin breakdown will have a volume reduction of 30% by week 4 Target Resolution Date Initiated: 07/22/2019 Date Inactivated: 08/26/2019 Date: 08/22/2019 Goal Status: Met Ulcer/skin breakdown will have a volume reduction of 50% by week 8 Date Initiated: 08/26/2019 Date Inactivated: 09/30/2019 Target11/20/2020 Resolution Date: Goal Status: Unmet Unmet Reason: comorbities Ulcer/skin breakdown will have a volume reduction of 80% by week 12 Date Initiated: 09/30/2019 Target Resolution  Date: 11/07/2019 Goal Status: Active Interventions: Assess patient/caregiver ability to obtain necessary supplies Assess patient/caregiver ability to perform ulcer/skin care regimen upon admission and as needed Assess ulceration(s) every visit Notes: Electronic Signature(s) Signed: 10/17/2019 5:42:17 PM By: Kela Millin Entered By: Kela Millin on 10/17/2019 13:24:50 --------------------------------------------------------------------------------  Pain Assessment Details Patient Name: Date of Service: YUNIOR, JAIN 10/17/2019 1:15 PM Medical Record [id number] Patient Account Number: [id number] Date of Birth/Sex: Treating RN: 1976/06/10 (43 y.o. [name]) Carlene Coria Primary Care Terence Googe: [name], [name] Other Clinician: Referring Skyler Carel: Treating Esmirna Ravan/Extender:[name], [name], [name] in Treatment: 25 Active Problems Location of Pain Severity and Description of Pain Patient Has Paino No Site Locations Pain Management and Medication Current Pain Management: Electronic Signature(s) Signed: 10/17/2019 5:51:21 PM By: Carlene Coria RN Entered By: Carlene Coria on 10/17/2019 13:53:56 -------------------------------------------------------------------------------- Patient/Caregiver Education Details Patient Name: Date of Service: [name] 12/18/2020andnbsp1:15 PM Medical Record [phone] Patient Account Number: [id number] Date of Birth/Gender: 01/03/1976 (43 y.o. M) Treating RN: Kela Millin Primary Care Physician: [name], [name] Other Clinician: Referring Physician: Treating Physician/Extender:[name], [name], [name] in Treatment: 25 Education Assessment Education Provided To: Patient Education Topics Provided Wound/Skin Impairment: Methods: Explain/Verbal Responses: State content correctly Electronic Signature(s) Signed: 10/17/2019 5:42:17 PM By: Kela Millin Entered By: Kela Millin on 10/17/2019  13:25:10 -------------------------------------------------------------------------------- Wound Assessment Details Patient Name: Date of Service: JASEAN, [name] 10/17/2019 1:15 PM Medical Record [id number] Patient Account Number: [id number] Date of Birth/Sex: Treating RN: 14-Dec-1975 (43 y.o. [name]) Carlene Coria Primary Care Sukaina Toothaker: [name], [name] Other Clinician: Referring Teren Franckowiak: Treating Asante Blanda/Extender:[name], [name], [name] in Treatment: 25 Wound Status Wound Number: 13 Primary Venous Leg Ulcer Etiology: Wound Location: Left Lower Leg - Anterior, Proximal Wound Open Wounding Event: Gradually Appeared Status: Date Acquired: 09/23/2019 Comorbid Asthma, Sleep Apnea, Deep Vein Weeks Of Treatment: 2 History: Thrombosis, Phlebitis, Osteoarthritis, Clustered Wound: No Neuropathy Photos Wound Measurements Length: (cm) 0.5 % Redu Width: (cm) 1 % Redu Depth: (cm) 0.1 Epithe Area: (cm) 0.393 Tunne Volume: (cm) 0.039 Undermi Wound Description Full Thickness Without Exposed Support Foul Od Classification: Structures Slough/ Wound Flat and Intact Margin: Exudate Medium Amount: Exudate Serosanguineous Type: Exudate red, brown Color: Wound Bed Granulation Amount: Large (67-100%) Granulation Quality: Red Fascia Necrotic Amount: None Present (0%) Fat Lay Tendon Muscle Joint E Bone Ex or After Cleansing: No Fibrino No Exposed Structure Exposed: No er (Subcutaneous Tissue) Exposed: Yes Exposed: No Exposed: No xposed: No posed: No ction in Area: 80.4% ction in Volume: 80.5% lialization: None ling: No ning: No Treatment Notes Wound #13 (Left, Proximal, Anterior Lower Leg) 1. Cleanse With Wound Cleanser Soap and water 2. Periwound Care Barrier cream Moisturizing lotion TCA Cream 3. Primary Dressing Applied Collegen AG Hydrogel or K-Y Jelly 4. Secondary Dressing ABD Pad Dry Gauze 6. Support Layer Applied 4 layer compression  wrap Notes netting. Electronic Signature(s) Signed: 10/20/2019 6:06:17 PM By: Mikeal Hawthorne EMT/HBOT Signed: 10/22/2019 11:01:46 AM By: Carlene Coria RN Previous Signature: 10/17/2019 5:51:21 PM Version By: Carlene Coria RN Entered By: Mikeal Hawthorne on 10/20/2019 14:47:20 -------------------------------------------------------------------------------- Wound Assessment Details Patient Name: Date of Service: ARNOLDO, [name] 10/17/2019 1:15 PM Medical Record [id number] Patient Account Number: [id number] Date of Birth/Sex: Treating RN: [birth year] (43 y.o. [name]) Carlene Coria Primary Care Darianny Momon: [name], [name] Other Clinician: Referring Fisher Hargadon: Treating Jennie Hannay/Extender:[name], [name], [name] in Treatment: 25 Wound Status Wound Number: [id number] Primary Venous Leg Ulcer Etiology: Wound Location: Left Lower Leg - Anterior Wound Open Wounding Event: Gradually Appeared Status: Date Acquired: 03/31/2019 Comorbid Asthma, Sleep Apnea, Deep Vein Thrombosis, Weeks Of Treatment: 25 History: Phlebitis, Osteoarthritis, Neuropathy Clustered Wound: No Photos Wound Measurements Length: (cm) 2.7 % Reduct Width: (cm) 0.9 % Reduct Depth: (cm) 0.1 Epitheli Area: (cm) 1.909 Tunneli Volume: (cm) 0.191 Undermi Wound Description Classification: Full Thickness Without Exposed Support Structures Wound Flat  and Intact Margin: Exudate Medium Amount: Exudate Sanguinous Type: Exudate red Color: Wound Bed Granulation Amount: Large (67-100%) Granulation Quality: Red Necrotic Amount: None Present (0%) Foul Odor After Cleansing: No Slough/Fibrino No Exposed Structure Fascia Exposed: No Fat Layer (Subcutaneous Tissue) Exposed: Yes Tendon Exposed: No Muscle Exposed: No Joint Exposed: No Bone Exposed: No ion in Area: 85.5% ion in Volume: 85.5% alization: None ng: No ning: No Treatment Notes Wound #9RR (Left, Anterior Lower Leg) 1. Cleanse With Wound Cleanser Soap and  water 2. Periwound Care Barrier cream Moisturizing lotion TCA Cream 3. Primary Dressing Applied Collegen AG Hydrogel or K-Y Jelly 4. Secondary Dressing ABD Pad Dry Gauze 6. Support Layer Applied 4 layer compression wrap Notes netting. Electronic Signature(s) Signed: 10/20/2019 6:06:17 PM By: Mikeal Hawthorne EMT/HBOT Signed: 10/22/2019 11:01:46 AM By: Carlene Coria RN Previous Signature: 10/17/2019 5:51:21 PM Version By: Carlene Coria RN Entered By: Mikeal Hawthorne on 10/20/2019 14:47:58 -------------------------------------------------------------------------------- Vitals Details Patient Name: Date of Service: NAT, [name] 10/17/2019 1:15 PM Medical Record [id number] Patient Account Number: [id number] Date of Birth/Sex: Treating RN: 1976/10/17 (43 y.o. [name]) Carlene Coria Primary Care Natascha Edmonds: [name], [name] Other Clinician: Referring Ashleynicole Mcclees: Treating Liliya Fullenwider/Extender:[name], [name], [name] in Treatment: 25 Vital Signs Time Taken: 13:53 Temperature (F): 98.7 Height (in): 74 Pulse (bpm): 84 Weight (lbs): 325 Respiratory Rate (breaths/min): 18 Body Mass Index (BMI): 41.7 Blood Pressure (mmHg): 152/92 Reference Range: 80 - 120 mg / dl Electronic Signature(s) Signed: 10/17/2019 5:51:21 PM By: Carlene Coria RN Entered By: Carlene Coria on 10/17/2019 13:53:44

## 2019-10-17 NOTE — Progress Notes (Signed)
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 He was also felt to have cellulitis. There was apparently not any acute clot. He I he was put on a 3 time a day medication/antibiotic which I am assuming is clindamycin. He has 2 reopened areas both on the anterior tibia 9/29; he completed the clindamycin even though it caused diarrhea. The diarrhea is resolved. The cellulitis in the left leg that was prominent last week has resolved. 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 I looked in [facility] link I do not see the actual CT scan report. He is being apparently scheduled for an attempt at stenting  retrograde and then if that does not work anterior grade by Dr. Trula Ore. We use silver alginate to the wound last week because of coexistent cellulitis 10/6; cellulitis on the left leg resolved. He is still waiting for insurance authorization for his pelvic vein stenting. Changed him to [facility] last week 10/13; cellulitis remains resolved. Wound is smaller. Using Hydrofera Blue under compression. In 2 days time he is going for his venous stenting hopefully by Dr. Trula Ore at [facility] 10/20; the patient went for his  procedure last Thursday by Dr. Trula Ore although he does not remember in the postop period what he was told. He is not certain whether Dr. Trula Ore managed to get the stent then successfully. His wound is measuring smaller looks healthy on the left mid tibia. He is also having some discomfort behind his left knee that he had me look at which is the site where Dr. Trula Ore had venous access. I looked in care everywhere. I could not see a specific note from Dr. Trula Ore [[facility]] above the actual procedure. Presumably it is still in transfer therefore I could not really answer his question about whether the stent was placed successful 10/27; wound not quite as good as last week. Using Hydrofera Blue. He thinks it might of stuck to the wound and was adherent when they took it off today. His procedure with Dr. Trula Ore was not successful. He thinks that Dr. Trula Ore will try to go at this from the other direction at some point. He sees him in 2-1/2-week 11/3; quite an improvement in wound surface area this week. We are using Hydrofera Blue. No need to change the dressing. Follows up with Dr. Trula Ore in about a week and a half 11/10; wound surface not as good this week at all. No changes in dimensions. We have been using Hydrofera Blue. As well he had tells [place] he had discomfort in his foot all week although he admits he was up on this more than usual. He comes in today with 2 small punched out areas  on the left lateral foot. He has severe venous hypertension. He sees Dr. Trula Ore again on 11/16 11/17; not much change in the area on the anterior tibia mid aspect. He also has 2 areas on the left lateral foot. We have been using Sorbact on the left anterior and Iodosorb ointment on the left foot. He is under 4-layer compression. The patient went to see Dr. Trula Ore I do not yet have access to this note and care everywhere. However according to the patient there is an option to try and address his venous occlusion I think via a transjugular approach. This is complicated by the fact that the IVC filter is there and would have to be traversed. According to the patient there is about a 33% chance of perforation may be even aortic perforation. However they would be prepared for this. He is thinking about this and discussing it with his wife Thanks to our case manager this week we are able to determine if for some reason the patient is not using his compression pumps. He also is not able to get stockings on the right leg which are 30/40 below-knee. I think we may need to order external compression garments 12/1; patient arrives with the area on the left anterior mid tibia expanded superiorly small rim of skin between the original area and the new area. He has painful small denuded areas on the left medial and left lateral heel. We have been using Iodoflex. His next appointment with the Dr. Trula Ore is January 4. after the holidays to discuss if he would like to pursue this endovascular option. Dr. Sherrin Daisy notes below from last visit Subjective [name] is a 43 y.o. 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 Unfortunately during the procedure a  stent was not able to be  placed d/t narrowing at the IVC. Per the patient the procedure provided 2-3 days of symptomatic relief (decrease in L LE pressure and pain) but after that his symptoms returned with increased pressure, pain in the hips and pain in the legs when standing. The patient does report his L LE wound is stable especially when he is consistently attending his wound care clinic in [place]. The patient denies using compressive LE therapies unless done at his wound clinic due to not being able to reach his legs. He is interested in learning more and possibly pursing further interventional options for his L lower extremity via access through neck. *Objective Physical Exam: BP 171/86  Pulse 80  Temp 37 C (Temporal)  Wt (!) 147.4 kg (325 lb)  BMI 41.73 kg/m General: awake, alert and oriented x 3 Chest: Non labored breathing on room air CVS: Normal rate. Regular rhythm. ABD: soft, NT, ND Ext: bilateral leg edema w/ left leg in compressive dressing from wound clinic. Evidence of skin discoloration on both feet bilaterally. Data Review: Labs: None to review Imaging:Reviewed report of 10/15 L LE venogram. I saw and evaluated the patient, participating in the key portions of the service. I reviewed the residentoos note. I agree with the residentoos findings and plan. Electronically signed by Derry Skill, MD at 09/16/2019 10:53 AM EST 12/11 the areas around his ankles have healed. 2 areas in the center part of the mid tibia area are still open we have been using silver collagen. Procedure attempt by Dr. Trula Ore at [facility] on 11/03/2019 12/18; the wounds around his ankles remain closed. I think there has been some improvement in the 2 areas in the center part of the mid tibia. Especially superiorly. Debris on the center required debridement we have been using silver collagen Objective Constitutional Patient is hypertensive.. Pulse regular and within target range for patient.Marland Kitchen Respirations  regular, non-labored and within target range.. Temperature is normal and within the target range for the patient.Marland Kitchen Appears in no distress. Vitals Time Taken: 1:53 PM, Height: 74 in, Weight: 325 lbs, BMI: 41.7, Temperature: 98.7 F, Pulse: 84 bpm, Respiratory Rate: 18 breaths/min, Blood Pressure: 152/92 mmHg. General Notes: Wound exam; superficial area in the mid tibia. 2 wounds separated by increasing areas of normal skin. Debris on the surface debrided with a #3 curette. Hemostasis with direct pressure Integumentary (Hair, Skin) Wound #13 status is Open. Original cause of wound was Gradually Appeared. The wound is located on the Left,Proximal,Anterior Lower Leg. The wound measures 0.5cm length x 1cm width x 0.1cm depth; 0.393cm^2 area and 0.039cm^3 volume. There is Fat Layer (Subcutaneous Tissue) Exposed exposed. There is no tunneling or undermining noted. There is a medium amount of serosanguineous drainage noted. The wound margin is flat and intact. There is large (67-100%) red granulation within the wound bed. There is no necrotic tissue within the wound bed. Wound #9RR status is Open. Original cause of wound was Gradually Appeared. The wound is located on the Left,Anterior Lower Leg. The wound measures 2.7cm length x 0.9cm width x 0.1cm depth; 1.909cm^2 area and 0.191cm^3 volume. There is Fat Layer (Subcutaneous Tissue) Exposed exposed. There is no tunneling or undermining noted. There is a medium amount of sanguinous drainage noted. The wound margin is flat and intact. There is large (67-100%) red granulation within the wound bed. There is no necrotic tissue within the wound bed. Assessment Active Problems ICD-10 Non-pressure chronic ulcer of other part of left lower leg  limited to breakdown of skin Chronic venous hypertension (idiopathic) with inflammation of left lower extremity Non-pressure chronic ulcer of other part of left foot with other specified severity Lymphedema, not  elsewhere classified Other thrombophilia Procedures Wound #9RR Pre-procedure diagnosis of Wound #9RR is a Venous Leg Ulcer located on the Left,Anterior Lower Leg .Severity of Tissue Pre Debridement is: Fat layer exposed. There was a Excisional Skin/Subcutaneous Tissue Debridement with a total area of 2.43 sq cm performed by [name]., MD. With the following instrument(s): Curette to remove Viable and Non-Viable tissue/material. Material removed includes Subcutaneous Tissue and Slough and after achieving pain control using Other (benzocaine, 20%). No specimens were taken. A time out was conducted at 14:22, prior to the start of the procedure. A Minimum amount of bleeding was controlled with Pressure. The procedure was tolerated well with a pain level of 0 throughout and a pain level of 0 following the procedure. Post Debridement Measurements: 2.7cm length x 0.9cm width x 0.1cm depth; 0.191cm^3 volume. Character of Wound/Ulcer Post Debridement is improved. Severity of Tissue Post Debridement is: Fat layer exposed. Post procedure Diagnosis Wound #9RR: Same as Pre-Procedure Pre-procedure diagnosis of Wound #9RR is a Venous Leg Ulcer located on the Left,Anterior Lower Leg . There was a Four Layer Compression Therapy Procedure by Shawn Stall, RN. Post procedure Diagnosis Wound #9RR: Same as Pre-Procedure Wound #13 Pre-procedure diagnosis of Wound #13 is a Venous Leg Ulcer located on the Left,Proximal,Anterior Lower Leg . There was a Four Layer Compression Therapy Procedure by Shawn Stall, RN. Post procedure Diagnosis Wound #13: Same as Pre-Procedure Plan Follow-up Appointments: Return Appointment in 2 weeks. - MD visit Nurse Visit: - next week Dressing Change Frequency: Wound #9RR Left,Anterior Lower Leg: Do not change entire dressing for one week. Skin Barriers/Peri-Wound Care: TCA Cream or Ointment Wound Cleansing: May shower with protection. Primary Wound Dressing: Wound  #13 Left,Proximal,Anterior Lower Leg: Silver Collagen - moisten with hydrogel Wound #9RR Left,Anterior Lower Leg: Silver Collagen - moisten with hydrogel Secondary Dressing: Wound #13 Left,Proximal,Anterior Lower Leg: Dry Gauze ABD pad Wound #9RR Left,Anterior Lower Leg: Dry Gauze ABD pad Edema Control: 4 layer compression: Left lower extremity Avoid standing for long periods of time Elevate legs to the level of the heart or above for 30 minutes daily and/or when sitting, a frequency of: Support Garment 30-40 mm/Hg pressure to: - right leg : apply compression stockings dual layer in the am and remove at night. Segmental Compressive Device. - lymphedema pumps 1 hour 2 times per day. 1. Continue with silver collagen under compression. 2. Procedural visit to Dr. Trula Ore on 1/4 Electronic Signature(s) Signed: 10/17/2019 6:01:02 PM By: [name] Najjar MD Entered By: [name] on 10/17/2019 14:31:35 -------------------------------------------------------------------------------- SuperBill Details Patient Name: Date of Service: [name], [name] 10/17/2019 Medical Record [id number] Patient Account Number: [id number] Date of Birth/Sex: Treating RN: [birth year] (43 y.o. 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 I89.0 Lymphedema, not elsewhere classified D68.69 Other thrombophilia Facility Procedures CPT4 Code Description: [id number] 11042 - DEB SUBQ TISSUE 20 SQ CM/< ICD-10 Diagnosis Description L97.821 Non-pressure chronic  ulcer of other part of left lower le skin Modifier: g limited to  br Quantity: 1 eakdown of Physician Procedures CPT4 Code Description: [id number] 11042 - WC PHYS SUBQ TISS 20 SQ CM ICD-10 Diagnosis Description L97.821 Non-pressure chronic ulcer of other part of left lower le skin Modifier: g limited to bre Quantity: 1 akdown of Electronic Signature(s) Signed: 10/17/2019 6:01:02 PM By: [name] Najjar MD Entered By: [name] on 10/17/2019 14:31:49

## 2019-10-23 ENCOUNTER — Encounter (HOSPITAL_BASED_OUTPATIENT_CLINIC_OR_DEPARTMENT_OTHER): Payer: Medicare HMO | Admitting: Internal Medicine

## 2019-10-23 ENCOUNTER — Other Ambulatory Visit: Payer: Self-pay

## 2019-10-23 DIAGNOSIS — L97521 Non-pressure chronic ulcer of other part of left foot limited to breakdown of skin: Secondary | ICD-10-CM | POA: Diagnosis not present

## 2019-10-23 NOTE — Progress Notes (Signed)
BOYKIN, BAETZ ([id number]) Visit Report for 10/23/2019 SuperBill Details Patient Name: Date of Service: [name], [name] 10/23/2019 Medical Record [id number] Patient Account Number: [id number] Date of Birth/Sex: Treating RN: [birth year] (43 y.o. [name]) Carlene Coria Primary Care Provider: [name], [name] Other Clinician: Referring Provider: Treating Provider/Extender:Prestin Munch, [name], [name] in Treatment: 26 Diagnosis Coding ICD-10 Codes Code Description L97.821 Non-pressure chronic ulcer of other part of left lower leg limited to breakdown of skin I87.322 Chronic venous hypertension (idiopathic) with inflammation of left lower extremity L97.528 Non-pressure chronic ulcer of other part of left foot with other specified severity I89.0 Lymphedema, not elsewhere classified D68.69 Other thrombophilia Facility Procedures CPT4 Code Description Modifier Quantity [phone] (Facility Use Only) [phone] - APPLY MULTLAY COMPRS LWR LT LEG 1 Electronic Signature(s) Signed: 10/23/2019 11:24:03 AM By: Carlene Coria RN Signed: 10/23/2019 12:25:31 PM By: Linton Ham MD Entered By: Carlene Coria on 10/23/2019 09:42:03

## 2019-10-23 NOTE — Progress Notes (Signed)
[name], [name] ([id number]) Visit Report for 10/23/2019 Arrival Information Details Patient Name: Date of Service: [name], [name] 10/23/2019 9:00 AM Medical Record [id number] Patient Account Number: [id number] Date of Birth/Sex: Treating RN: 06-Apr-1976 (43 y.o. [name]) Carlene Coria Primary Care [name]: [name], [name] Other Clinician: Referring [name]: Treating [name]/Extender:[name], [name] [name], [name] Weeks in Treatment: 26 Visit Information History Since Last Visit All ordered tests and consults were completed: No Patient Arrived: Ambulatory Added or deleted any medications: No Arrival Time: 09:13 Any new allergies or adverse reactions: No Accompanied By: self Had a fall or experienced change in No Transfer Assistance: None activities of daily living that may affect Patient Identification Verified: Yes risk of falls: Secondary Verification Process Yes Signs or symptoms of abuse/neglect since last No Completed: visito Patient Requires Transmission-Based No Hospitalized since last visit: No Precautions: Implantable device outside of the clinic excluding No Patient Has Alerts: No cellular tissue based products placed in the center since last visit: Has Dressing in Place as Prescribed: Yes Has Compression in Place as Prescribed: Yes Pain Present Now: Yes Electronic Signature(s) Signed: 10/23/2019 11:24:03 AM By: Carlene Coria RN Entered By: Carlene Coria on 10/23/2019 09:13:55 -------------------------------------------------------------------------------- Compression Therapy Details Patient Name: Date of Service: [name], [name] 10/23/2019 9:00 AM Medical Record [id number] Patient Account Number: [id number] Date of Birth/Sex: Treating RN: Nov 08, 1975 (43 y.o. [name] Primary Care [name]: [name], [name] Other Clinician: Referring [name]: Treating [name]/Extender:[name], [name] [name], [name] Weeks in Treatment: 26 Compression Therapy  Performed for Wound Wound #13 Left,Proximal,Anterior Lower Leg Assessment: Performed By: [name] Church, RN Compression Type: Three Layer Electronic Signature(s) Signed: 10/23/2019 11:24:03 AM By: Carlene Coria RN Entered By: Carlene Coria on 10/23/2019 09:40:48 -------------------------------------------------------------------------------- Compression Therapy Details Patient Name: Date of Service: [name], [name] 10/23/2019 9:00 AM Medical Record [id number] Patient Account Number: [id number] Date of Birth/Sex: Treating RN: 10/28/1976 (43 y.o. [name]) Carlene Coria Primary Care Jovee Dettinger: [name], [name] Other Clinician: Referring [name]: Treating [name]/Extender:[name], [name] [name], [name] Weeks in Treatment: 26 Compression Therapy Performed for Wound Wound #9RR Left,Anterior Lower Leg Assessment: Performed By: [name] Church, RN Compression Type: Three Layer Electronic Signature(s) Signed: 10/23/2019 11:24:03 AM By: Carlene Coria RN Entered By: Carlene Coria on 10/23/2019 09:40:48 -------------------------------------------------------------------------------- Encounter Discharge Information Details Patient Name: Date of Service: [name], [name] 10/23/2019 9:00 AM Medical Record [id number] Patient Account Number: [id number] Date of Birth/Sex: Treating RN: 28-Nov-1975 (43 y.o. [name]) Carlene Coria Primary Care [name]: [name], [name] Other Clinician: Referring [name]: Treating [name]/Extender:[name], [name] [name], [name] Weeks in Treatment: 26 Encounter Discharge Information Items Discharge Condition: Stable Ambulatory Status: Ambulatory Discharge Destination: Home Transportation: Private Auto Accompanied By: self Schedule Follow-up Appointment: Yes Clinical Summary of Care: Patient Declined Electronic Signature(s) Signed: 10/23/2019 11:24:03 AM By: Carlene Coria RN Entered By: Carlene Coria on 10/23/2019  09:41:53 -------------------------------------------------------------------------------- Patient/Caregiver Education Details Patient Name: [name] 12/24/2020andnbsp9:00 Date of Service: AM Medical Record [id number] Number: Patient Account Number: [id number] Treating RN: [birth year] (43 y.o. Carlene Coria Date of Birth/Gender: M) Other Clinician: Primary Care Physician: [name], [name] Treating [name] Referring Physician: Physician/Extender: [name] Weeks in Treatment: 24 Education Assessment Education Provided To: Patient Education Topics Provided Wound/Skin Impairment: Methods: Explain/Verbal Responses: State content correctly Electronic Signature(s) Signed: 10/23/2019 11:24:03 AM By: Carlene Coria RN Entered By: Carlene Coria on 10/23/2019 09:41:39 -------------------------------------------------------------------------------- Wound Assessment Details Patient Name: Date of Service: [name], [name] 10/23/2019 9:00 AM Medical Record [id number] Patient Account Number: [id number] Date of Birth/Sex: Treating RN: [birth year] (43 y.o. [name] Primary Care Saniyyah Elster: [name], [name] Other Clinician: Referring [name]:  Treating [name]/Extender:[name], [name], [name] Weeks in Treatment: 26 Wound Status Wound Number: 13 Primary Etiology: Venous Leg Ulcer Wound Location: Left, Proximal, Anterior Lower Leg Wound Status: Open Wounding Event: Gradually Appeared Date Acquired: 09/23/2019 Weeks Of Treatment: 3 Clustered Wound: No Wound Measurements Length: (cm) 0.5 % Reduct Width: (cm) 1 % Reduct Depth: (cm) 0.1 Area: (cm) 0.393 Volume: (cm) 0.039 Wound Description Full Thickness Without Exposed Support Classification: Structures ion in Area: 80.4% ion in Volume: 80.5% Treatment Notes Wound #13 (Left, Proximal, Anterior Lower Leg) 1. Cleanse With Wound Cleanser Soap and water 3. Primary Dressing Applied Collegen AG Hydrogel  or K-Y Jelly 4. Secondary Dressing Dry Gauze 6. Support Layer Applied 3 layer compression wrap Notes netting Electronic Signature(s) Signed: 10/23/2019 11:24:03 AM By: [name] RN Entered By: [name] on 10/23/2019 09:16:43 -------------------------------------------------------------------------------- Wound Assessment Details Patient Name: Date of Service: [name], [name] 10/23/2019 9:00 AM Medical Record [id number] Patient Account Number: [id number] Date of Birth/Sex: Treating RN: [birth year] (43 y.o. [name]) [name] Primary Care Suheyb Raucci: [name], [name] Other Clinician: Referring Leilany Digeronimo: Treating Carlea Badour/Extender:[name], [name], [name] Weeks in Treatment: 26 Wound Status Wound Number: [id number] Primary Etiology: Venous Leg Ulcer Wound Location: Left, Anterior Lower Leg Wound Status: Open Wounding Event: Gradually Appeared Date Acquired: 03/31/2019 Weeks Of Treatment: 26 Clustered Wound: No Wound Measurements Length: (cm) 2.7 % Reduct Width: (cm) 0.9 % Reduct Depth: (cm) 0.1 Area: (cm) 1.909 Volume: (cm) 0.191 Wound Description Full Thickness Without Exposed Support Classification: Structures ion in Area: 85.5% ion in Volume: 85.5% Treatment Notes Wound #9RR (Left, Anterior Lower Leg) 1. Cleanse With Wound Cleanser Soap and water 3. Primary Dressing Applied Collegen AG Hydrogel or K-Y Jelly 4. Secondary Dressing Dry Gauze 6. Support Layer Applied 3 layer compression wrap Notes netting Electronic Signature(s) Signed: 10/23/2019 11:24:03 AM By: [name] RN Entered By: [name] on 10/23/2019 09:16:43 -------------------------------------------------------------------------------- Vitals Details Patient Name: Date of Service: [name], [name] 10/23/2019 9:00 AM Medical Record [id number] Patient Account Number: [id number] Date of Birth/Sex: Treating RN: 1975/12/24 (43 y.o. [name]) [name] Primary Care Jamisen Duerson: [name],  [name] Other Clinician: Referring Camilla Skeen: Treating Dejanee Thibeaux/Extender:[name], [name], [name] Weeks in Treatment: 26 Vital Signs Time Taken: 09:13 Temperature (F): 98.2 Height (in): 74 Pulse (bpm): 89 Weight (lbs): 325 Respiratory Rate (breaths/min): 18 Body Mass Index (BMI): 41.7 Blood Pressure (mmHg): 159/88 Reference Range: 80 - 120 mg / dl Electronic Signature(s) Signed: 10/23/2019 11:24:03 AM By: [name] RN Entered By: [name] on 10/23/2019 09:40:22

## 2019-10-30 ENCOUNTER — Other Ambulatory Visit: Payer: Self-pay

## 2019-10-30 ENCOUNTER — Encounter (HOSPITAL_BASED_OUTPATIENT_CLINIC_OR_DEPARTMENT_OTHER): Payer: Medicare HMO | Admitting: Internal Medicine

## 2019-10-30 DIAGNOSIS — L97521 Non-pressure chronic ulcer of other part of left foot limited to breakdown of skin: Secondary | ICD-10-CM | POA: Diagnosis not present

## 2019-10-30 NOTE — Progress Notes (Signed)
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 in the postop period what he was told. He is not certain whether Dr. Trula Ore managed to get the stent then successfully. His wound is measuring smaller looks healthy on the left mid tibia. He is also having some discomfort behind his left knee that he had me look at which is the site where Dr. Trula Ore had venous access. I looked in care everywhere. I could not see a specific note from Dr. Trula Ore [[facility]] above the actual procedure. Presumably it is still in  transfer therefore I could not really answer his question about whether the stent was  placed successful 10/27; wound not quite as good as last week. Using Hydrofera Blue. He thinks it might of stuck to the wound and was adherent when they took it off today. His procedure with Dr. Trula Ore was not successful. He thinks that Dr. Trula Ore will try to go at this from the other direction at some point. He sees him in 2-1/2-week 11/3; quite an improvement in wound surface area this week. We are using Hydrofera Blue. No need to change the dressing. Follows up with Dr. Trula Ore in about a week and a half 11/10; wound surface not as good this week at all. No changes in dimensions. We have been using Hydrofera Blue. As well he had tells [place] he had discomfort in his foot all week although he admits he was up on this more than usual. He comes in today with 2 small punched out areas on the left lateral foot. He has severe venous hypertension. He sees Dr. Trula Ore again on 11/16 11/17; not much change in the area on the anterior tibia mid aspect. He also has 2 areas on the left lateral foot. We have been using Sorbact on the left anterior and Iodosorb ointment on the left foot. He is under 4-layer compression. The patient went to see Dr. Trula Ore I do not yet have access to this note and care everywhere. However according to the patient there is an option to try and address his venous occlusion I think via a transjugular approach. This is complicated by the fact that the IVC filter is there and would have to be traversed. According to the patient there is about a 33% chance of perforation may be even aortic perforation. However they would be prepared for this. He is thinking about this and discussing it with his wife Thanks to our case manager this week we are able to determine if for some reason the patient is not using his compression pumps. He also is not able to get stockings on the right leg which are 30/40  below-knee. I think we may need to order external compression garments 12/1; patient arrives with the area on the left anterior mid tibia expanded superiorly small rim of skin between the original area and the new area. He has painful small denuded areas on the left medial and left lateral heel. We have been using Iodoflex. His next appointment with the Dr. Trula Ore is January 4. after the holidays to discuss if he would like to pursue this endovascular option. Dr. Sherrin Daisy notes below from last visit Subjective [name] is a 43 y.o. 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 Unfortunately during the procedure a stent was not able to be placed d/t narrowing at the IVC. Per the patient the procedure provided 2-3 days of symptomatic relief (decrease in L LE pressure and pain) but after that his symptoms returned with increased pressure, pain in the hips and pain in the legs when standing. The patient does report his L LE wound is stable especially when he is consistently attending his wound care clinic in [place]. The patient denies using compressive LE therapies unless done at his wound clinic due to not being able to reach his legs. He is interested in learning more and possibly pursing further interventional options for his L lower  extremity via access through neck. *Objective Physical Exam: BP 171/86  Pulse 80  Temp 37 C (Temporal)  Wt (!) 147.4 kg (325 lb)  BMI 41.73 kg/m General: awake, alert and oriented x 3 Chest: Non labored breathing on room air CVS: Normal rate. Regular rhythm. ABD: soft, NT, ND Ext: bilateral leg edema w/ left leg in compressive dressing from wound clinic. Evidence of skin discoloration on both feet bilaterally. Data  Review: Labs: None to review Imaging:Reviewed report of 10/15 L LE venogram. I saw and evaluated the patient, participating in the key portions of the service. I reviewed the residentoos note. I agree with the residentoos findings and plan. Electronically signed by Derry Skill, MD at 09/16/2019 10:53 AM EST 12/11 the areas around his ankles have healed. 2 areas in the center part of the mid tibia area are still open we have been using silver collagen. Procedure attempt by Dr. [facility]ula [facility]re at [facility] on 11/03/2019 12/18; the wounds around his ankles remain closed. I think there has been some improvement in the 2 areas in the center part of the mid tibia. Especially superiorly. Debris on the center required debridement we have been using silver collagen 12/31; I saw the patient briefly last week when he was here for a nurse visit. He had irritation in which look believed to be localized cellulitis and folliculitis on the lateral left calf. I gave him a prescription for cephalexin although he admits today he never filled it. The area still looked very angry. His original wounds we have been treating where a figure-of-eight shaped area on the anterior tibial area mid aspect. We have been using silver collagen these do not look healthy. Objective Constitutional Vitals Time Taken: 12:55 PM, Height: 74 in, Weight: 325 lbs, BMI: 41.7, Temperature: 98.9 F, Pulse: 94 bpm, Respiratory Rate: 20 breaths/min, Blood Pressure: 148/84 mmHg. Cardiovascular Pedal pulses are palpable. Edema control is adequate on the left. Psychiatric appears at normal baseline. General Notes: Wound exam; his wounds in the mid tibia are not any bigger however the surfaces appeared dusky. Areas on the left lateral calf superficial epithelial loss surrounding erythema which is very tender. I suspect this represents cellulitis Integumentary (Hair, Skin) Erythema around the wounds on the left lateral calf. Superficial areas  x2. Wound #13 status is Open. Original cause of wound was Gradually Appeared. The wound is located on the Left,Proximal,Anterior Lower Leg. The wound measures 1cm length x 1cm width x 0.1cm depth; 0.785cm^2 area and 0.079cm^3 volume. There is Fat Layer (Subcutaneous Tissue) Exposed exposed. There is no tunneling or undermining noted. There is a medium amount of serosanguineous drainage noted. The wound margin is distinct with the outline attached to the wound base. There is large (67-100%) red granulation within the wound bed. There is a small (1-33%) amount of necrotic tissue within the wound bed including Adherent Slough. Wound #14 status is Open. Original cause of wound was Blister. The wound is located on the Left,Proximal,Lateral Lower Leg. The wound measures 1.1cm length x 1cm width x 0.1cm depth; 0.864cm^2 area and 0.086cm^3 volume. There is no tunneling or undermining noted. There is a medium amount of serosanguineous drainage noted. The wound margin is distinct with the outline attached to the wound base. There is large (67-100%) pink granulation within the wound bed. There is no necrotic tissue within the wound bed. Wound #15 status is Open. Original cause of wound was Blister. The wound is located on the Left,Lateral Lower Leg. The wound measures 2.5cm length x  1.8cm width x 0.1cm depth; 3.534cm^2 area and 0.353cm^3 volume. There is no tunneling or undermining noted. There is a medium amount of serosanguineous drainage noted. The wound margin is distinct with the outline attached to the wound base. There is large (67-100%) red granulation within the wound bed. There is no necrotic tissue within the wound bed. Wound #9RR status is Open. Original cause of wound was Gradually Appeared. The wound is located on the Left,Anterior Lower Leg. The wound measures 2.6cm length x 1cm width x 0.1cm depth; 2.042cm^2 area and 0.204cm^3 volume. There is Fat Layer (Subcutaneous Tissue) Exposed exposed.  There is no tunneling or undermining noted. There is a medium amount of serosanguineous drainage noted. The wound margin is distinct with the outline attached to the wound base. There is large (67-100%) red granulation within the wound bed. There is a small (1-33%) amount of necrotic tissue within the wound bed. 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 There was a Four Layer Compression Therapy Procedure by Shawn Stall, RN. Post procedure Diagnosis Wound #13: Same as Pre-Procedure Wound #14 Pre-procedure diagnosis of Wound #14 is a Venous Leg Ulcer located on the Left,Proximal,Lateral Lower Leg . There was a Four Layer Compression Therapy Procedure by Shawn Stall, RN. Post procedure Diagnosis Wound #14: Same as Pre-Procedure Wound #15 Pre-procedure diagnosis of Wound #15 is a Venous Leg Ulcer located on the Left,Lateral Lower Leg . There was a Four Layer Compression Therapy Procedure by Shawn Stall, RN. Post procedure Diagnosis Wound #15: Same as Pre-Procedure Wound #9RR Pre-procedure diagnosis of Wound #9RR is a Venous Leg Ulcer located on the Left,Anterior Lower Leg . There was a Four Layer Compression Therapy Procedure by Shawn Stall, RN. Post procedure Diagnosis Wound #9RR: Same as Pre-Procedure Plan Follow-up Appointments: Return Appointment in 1 week. - Friday Dressing Change Frequency: Do not change entire dressing for one week. Skin Barriers/Peri-Wound Care: TCA Cream or Ointment - to left leg. Wound Cleansing: May shower with  protection. Primary Wound Dressing: Wound #13 Left,Proximal,Anterior Lower Leg: Other: - cutimed sorbact swab with hydrogel. Wound #9RR Left,Anterior Lower Leg: Other: - cutimed sorbact swab with hydrogel. Wound #14 Left,Proximal,Lateral Lower Leg: Calcium Alginate with Silver Wound #15 Left,Lateral Lower Leg: Calcium Alginate with Silver Secondary Dressing: Dry Gauze ABD pad Edema Control: 4 layer compression: Left lower extremity Avoid standing for long periods of time Elevate legs to the level of the heart or above for 30 minutes daily and/or when sitting, a frequency of: Support Garment 30-40 mm/Hg pressure to: - right leg : apply compression stockings dual layer in the am and remove at night. Segmental Compressive Device. - lymphedema pumps 1 hour 2 times per day. Additional Orders / Instructions: Other: - start antibiotic right away and complete the entire course. 1. Use Sorbact to the anterior mid tibia wounds and silver alginate to the areas on the left lateral 2. 4-layer compression 3. He goes to see Dr. Trula Ore on Monday hopefully plan for interventional surgery Electronic Signature(s) Signed: 10/30/2019 2:35:12 PM By: Baltazar Najjar MD Entered By: Baltazar Najjar on 10/30/2019 13:24:09 -------------------------------------------------------------------------------- SuperBill Details Patient Name: Date of Service: [name] 10/30/2019 Medical  Record [id number] Patient Account Number: [id number] Date of Birth/Sex: Treating RN: [birth year] (43 y.o. 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 I89.0 Lymphedema, not elsewhere classified D68.69 Other thrombophilia Facility Procedures CPT4 Code Description: [id number] (Facility Use Only) [phone] - APPLY MULTLAY COMPRS LWR LT LEG Modifier: Quantity: 1 Physician Procedures CPT4 Code Description: [id number] - WC PHYS LEVEL 3 - EST PT ICD-10 Diagnosis Description L97.821 Non-pressure chronic ulcer of other part of left lower leg l skin I87.322 Chronic venous hypertension (idiopathic) with inflammation o L97.528  Non-pressure chronic ulcer of other part of left foot with o I89.0 Lymphedema, not elsewhere classified Modifier: imited to breakdow f left lower extre ther specified sev Quantity: 1 n of mity Programme researcher, broadcasting/film/video) Signed: 10/30/2019 2:35:12 PM By: Baltazar Najjar MD Entered By: Baltazar Najjar on 10/30/2019 13:24:32

## 2019-11-03 NOTE — Progress Notes (Addendum)
FED, CECI ([id number]) Visit Report for 10/30/2019 Arrival Information Details Patient Name: Date of Service: [name], [name] 10/30/2019 12:30 PM Medical Record [id number] Patient Account Number: [id number] Date of Birth/Sex: Treating RN: [birth year] (44 y.o. [name] Primary Care Trysta Showman: [name], [name] Other Clinician: Referring Jennylee Uehara: Treating Harlem Bula/Extender:[name], [name], [name] in Treatment: 27 Visit Information History Since Last Visit Added or deleted any medications: No Patient Arrived: Ambulatory Any new allergies or adverse reactions: No Arrival Time: 12:52 Had a fall or experienced change in No Accompanied By: self activities of daily living that may affect Transfer Assistance: None risk of falls: Patient Identification Verified: Yes Signs or symptoms of abuse/neglect since last No Secondary Verification Process Completed: Yes visito Patient Requires Transmission-Based No Hospitalized since last visit: No Precautions: Implantable device outside of the clinic excluding No Patient Has Alerts: No cellular tissue based products placed in the center since last visit: Has Dressing in Place as Prescribed: Yes Has Compression in Place as Prescribed: Yes Pain Present Now: Yes Electronic Signature(s) Signed: 10/30/2019 2:55:42 PM By: Deon Pilling Entered By: Deon Pilling on 10/30/2019 12:55:36 -------------------------------------------------------------------------------- Compression Therapy Details Patient Name: Date of Service: [name], [name] 10/30/2019 12:30 PM Medical Record [id number] Patient Account Number: [id number] Date of Birth/Sex: Treating RN: April 30, 1976 (44 y.o. [name] Primary Care Niquita Digioia: [name], [name] Other Clinician: Referring Tayvien Kane: Treating Kendalyn Cranfield/Extender:[name], [name], [name] in Treatment: 27 Compression Therapy Performed for Wound Wound #13 Left,Proximal,Anterior  Lower Leg Assessment: Performed By: Clinician Deon Pilling, RN Compression Type: Four Layer Post Procedure Diagnosis Same as Pre-procedure Electronic Signature(s) Signed: 10/30/2019 2:55:42 PM By: Deon Pilling Entered By: Deon Pilling on 10/30/2019 13:16:24 -------------------------------------------------------------------------------- Compression Therapy Details Patient Name: Date of Service: DAXEN, LANUM 10/30/2019 12:30 PM Medical Record [id number] Patient Account Number: [id number] Date of Birth/Sex: Treating RN: 1976-02-18 (44 y.o. [name] Primary Care Brysan Mcevoy: [name], [name] Other Clinician: Referring Aleathea Pugmire: Treating Randeep Biondolillo/Extender:[name], [name], [name] in Treatment: 27 Compression Therapy Performed for Wound Wound #14 Left,Proximal,Lateral Lower Leg Assessment: Performed By: Clinician Deon Pilling, RN Compression Type: Four Layer Post Procedure Diagnosis Same as Pre-procedure Electronic Signature(s) Signed: 10/30/2019 2:55:42 PM By: Deon Pilling Entered By: Deon Pilling on 10/30/2019 13:16:25 -------------------------------------------------------------------------------- Compression Therapy Details Patient Name: Date of Service: [name], [name] 10/30/2019 12:30 PM Medical Record [id number] Patient Account Number: [id number] Date of Birth/Sex: Treating RN: August 24, 1976 (44 y.o. [name] Primary Care Snigdha Howser: [name], [name] Other Clinician: Referring Kaly Mcquary: Treating Avalin Briley/Extender:[name], [name], [name] in Treatment: 27 Compression Therapy Performed for Wound Wound #15 Left,Lateral Lower Leg Assessment: Performed By: Clinician Deon Pilling, RN Compression Type: Four Layer Post Procedure Diagnosis Same as Pre-procedure Electronic Signature(s) Signed: 10/30/2019 2:55:42 PM By: Deon Pilling Entered By: Deon Pilling on 10/30/2019  13:16:25 -------------------------------------------------------------------------------- Compression Therapy Details Patient Name: Date of Service: [name], [name] 10/30/2019 12:30 PM Medical Record [id number] Patient Account Number: [id number] Date of Birth/Sex: Treating RN: 03-09-1976 (44 y.o. [name] Primary Care Airyonna Franklyn: [name], [name] Other Clinician: Referring Jaide Hillenburg: Treating Sequoia Mincey/Extender:[name], [name], [name] in Treatment: 27 Compression Therapy Performed for Wound Wound #9RR Left,Anterior Lower Leg Assessment: Performed By: Clinician Deon Pilling, RN Compression Type: Four Layer Post Procedure Diagnosis Same as Pre-procedure Electronic Signature(s) Signed: 10/30/2019 2:55:42 PM By: Deon Pilling Entered By: Deon Pilling on 10/30/2019 13:16:25 -------------------------------------------------------------------------------- Encounter Discharge Information Details Patient Name: Date of Service: [name], [name] 10/30/2019 12:30 PM Medical Record [id number] Patient Account Number: [id number] Date of Birth/Sex: Treating RN: Aug 18, 1976 (44 y.o. [name] Primary Care Ruthe Roemer:  Koirala, [name] Other Clinician: Referring Iniko Robles: Treating Bernisha Verma/Extender:[name], [name], [name] in Treatment: 27 Encounter Discharge Information Items Discharge Condition: Stable Ambulatory Status: Ambulatory Discharge Destination: Home Transportation: Private Auto Accompanied By: alone Schedule Follow-up Appointment: Yes Clinical Summary of Care: Patient Declined Electronic Signature(s) Signed: 11/03/2019 4:32:10 PM By: Kela Millin Entered By: Kela Millin on 10/30/2019 13:38:02 -------------------------------------------------------------------------------- Lower Extremity Assessment Details Patient Name: Date of Service: [name], [name] 10/30/2019 12:30 PM Medical Record [id number] Patient Account Number:  [id number] Date of Birth/Sex: Treating RN: 03-24-1976 (44 y.o. [name] Primary Care Saloma Cadena: [name], [name] Other Clinician: Referring Jacalyn Biggs: Treating Lee Kalt/Extender:[name], [name], [name] in Treatment: 27 Edema Assessment Assessed: [Left: Yes] [Right: No] Edema: [Left: Ye] [Right: s] Calf Left: Right: Point of Measurement: 35 cm From Medial Instep 46 cm cm Ankle Left: Right: Point of Measurement: 11.5 cm From Medial Instep 24 cm cm Vascular Assessment Pulses: Dorsalis Pedis Palpable: [Left:Yes] Electronic Signature(s) Signed: 10/30/2019 2:55:42 PM By: Deon Pilling Entered By: Deon Pilling on 10/30/2019 12:58:23 -------------------------------------------------------------------------------- Multi Wound Chart Details Patient Name: Date of Service: [name], [name] 10/30/2019 12:30 PM Medical Record [id number] Patient Account Number: [id number] Date of Birth/Sex: Treating RN: 21-Oct-1976 (44 y.o. M) Primary Care Rondale Nies: [name], [name] Other Clinician: Referring Avika Carbine: Treating Kamarii Buren/Extender:[name], [name], [name] in Treatment: 27 Vital Signs Height(in): 74 Pulse(bpm): 22 Weight(lbs): 325 Blood Pressure(mmHg): 148/84 Body Mass Index(BMI): 42 Temperature(F): 98.9 Respiratory 20 Rate(breaths/min): Photos: [13:No Photos] [14:No Photos] [15:No Photos] Wound Location: [13:Left Lower Leg - Anterior, Left Lower Leg - Lateral, Proximal] [14:Proximal] [15:Left Lower Leg - Lateral] Wounding Event: [13:Gradually Appeared] [14:Blister] [15:Blister] Primary Etiology: [13:Venous Leg Ulcer] [14:Venous Leg Ulcer] [15:Venous Leg Ulcer] Comorbid History: [13:Asthma, Sleep Apnea, DeepAsthma, Sleep Apnea, DeepAsthma, Sleep Apnea, Deep Vein Thrombosis, Phlebitis, Vein Thrombosis, Phlebitis, Vein Thrombosis, Phlebitis, Osteoarthritis, Neuropathy Osteoarthritis, Neuropathy Osteoarthritis,  Neuropathy] Date Acquired: [13:09/23/2019]  [14:10/30/2019] [15:10/23/2019] Weeks of Treatment: [13:4] [14:0] [15:0] Wound Status: [13:Open] [14:Open] [15:Open] Wound Recurrence: [13:No] [14:No] [15:No] Measurements L x W x D 1x1x0.1 [14:1.1x1x0.1] [15:2.5x1.8x0.1] (cm) Area (cm) : [13:0.785] [14:0.864] [15:3.534] Volume (cm) : [13:0.079] [14:0.086] [15:0.353] % Reduction in Area: [13:60.80%] [14:N/A] [15:N/A] % Reduction in Volume: 60.50% [14:N/A] [15:N/A] Classification: [13:Full Thickness Without Exposed Support Structures Exposed Support Structures Exposed Support Structures] [14:Full Thickness Without] [15:Full Thickness Without] Exudate Amount: [13:Medium] [14:Medium] [15:Medium] Exudate Type: [13:Serosanguineous] [14:Serosanguineous] [15:Serosanguineous] Exudate Color: [13:red, brown] [14:red, brown] [15:red, brown] Wound Margin: [13:Distinct, outline attached Distinct, outline attached Distinct, outline attached] Granulation Amount: [13:Large (67-100%)] [14:Large (67-100%)] [15:Large (67-100%)] Granulation Quality: [13:Red] [14:Pink] [15:Red] Necrotic Amount: [13:Small (1-33%)] [14:None Present (0%)] [15:None Present (0%)] Exposed Structures: [13:Fat Layer (Subcutaneous N/A Tissue) Exposed: Yes Fascia: No Tendon: No Muscle: No Joint: No Bone: No] [15:Fascia: No Fat Layer (Subcutaneous Tissue) Exposed: No Tendon: No Muscle: No Joint: No Bone: No] Epithelialization: [13:Small (1-33%)] [14:None] [15:None] Procedures Performed: Compression Therapy [13:9RR] [14:Compression Therapy N/A] [15:Compression Therapy N/A] Photos: [13:No Photos] [14:N/A] [15:N/A] Wound Location: [13:Left Lower Leg - Anterior] [14:N/A] [15:N/A] Wounding Event: [13:Gradually Appeared] [14:N/A] [15:N/A] Primary Etiology: [13:Venous Leg Ulcer] [14:N/A] [15:N/A] Comorbid History: [13:Asthma, Sleep Apnea, DeepN/A Vein Thrombosis, Phlebitis, Osteoarthritis, Neuropathy] [15:N/A] Date Acquired: [13:03/31/2019] [14:N/A] [15:N/A] Weeks of Treatment: [13:27]  [14:N/A] [15:N/A] Wound Status: [13:Open] [14:N/A] [15:N/A] Wound Recurrence: [13:Yes] [14:N/A] [15:N/A] Measurements L x W x D 2.6x1x0.1 [14:N/A] [15:N/A] (cm) Area (cm) : [13:2.042] [14:N/A] [15:N/A] Volume (cm) : [88:8.916] [14:N/A] [15:N/A] % Reduction in Area: [13:84.50%] [14:N/A] [15:N/A] % Reduction in Volume: [13:84.50%] [14:N/A] [15:N/A] Classification: [13:Full Thickness Without Exposed Support Structures] [14:N/A] [  15:N/A] Exudate Amount: [13:Medium] [14:N/A] [15:N/A] Exudate Type: [13:Serosanguineous] [14:N/A] [15:N/A] Exudate Color: [13:red, brown] [14:N/A] [15:N/A] Wound Margin: [13:Distinct, outline attached N/A] [15:N/A] Granulation Amount: [13:Large (67-100%)] [14:N/A] [15:N/A] Granulation Quality: [13:Red] [14:N/A] [15:N/A] Necrotic Amount: [13:Small (1-33%)] [14:N/A] [15:N/A] Exposed Structures: [13:Fat Layer (Subcutaneous N/A Tissue) Exposed: Yes Fascia: No Tendon: No Muscle: No Joint: No Bone: No] [15:N/A] Epithelialization: [13:Small (1-33%) Compression Therapy] [14:N/A N/A] [15:N/A N/A] Treatment Notes Electronic Signature(s) Signed: 10/30/2019 2:35:12 PM By: Linton Ham MD Entered By: Linton Ham on 10/30/2019 13:17:46 -------------------------------------------------------------------------------- Multi-Disciplinary Care Plan Details Patient Name: Date of Service: [name], [name] 10/30/2019 12:30 PM Medical Record [id number] Patient Account Number: [id number] Date of Birth/Sex: Treating RN: 04-25-1976 (44 y.o. [name] Primary Care Domanique Huesman: [name], [name] Other Clinician: Referring Eloise Mula: Treating Breeze Berringer/Extender:[name], [name], [name] in Treatment: 27 Active Inactive Wound/Skin Impairment Nursing Diagnoses: Knowledge deficit related to ulceration/compromised skin integrity Goals: Patient/caregiver will verbalize understanding of skin care regimen Date Initiated: 07/22/2019 Target Resolution Date:  11/07/2019 Goal Status: Active Ulcer/skin breakdown will have a volume reduction of 30% by week 4 Date Inactivated: 08/26/2019 Target Resolution Date Initiated: 07/22/2019 Date: 08/22/2019 Goal Status: Met Ulcer/skin breakdown will have a volume reduction of 50% by week 8 Date Initiated: 08/26/2019 Date Inactivated: 09/30/2019 Target Resolution Date: 09/19/2019 Goal Status: Unmet Unmet Reason: comorbities Ulcer/skin breakdown will have a volume reduction of 80% by week 12 Date Initiated: 09/30/2019 Target Resolution Date: 11/07/2019 Goal Status: Active Interventions: Assess patient/caregiver ability to obtain necessary supplies Assess patient/caregiver ability to perform ulcer/skin care regimen upon admission and as needed Assess ulceration(s) every visit Notes: Electronic Signature(s) Signed: 10/30/2019 2:55:42 PM By: Deon Pilling Entered By: Deon Pilling on 10/30/2019 13:10:38 -------------------------------------------------------------------------------- Pain Assessment Details Patient Name: Date of Service: [name], [name] 10/30/2019 12:30 PM Medical Record [id number] Patient Account Number: [id number] Date of Birth/Sex: Treating RN: 02-23-1976 (44 y.o. [name] Primary Care Knox Holdman: [name], [name] Other Clinician: Referring Pedro Whiters: Treating Lekeisha Arenas/Extender:[name], [name], [name] in Treatment: 27 Active Problems Location of Pain Severity and Description of Pain Patient Has Paino Yes Site Locations Rate the pain. Current Pain Level: 7 Worst Pain Level: 9 Least Pain Level: 0 Tolerable Pain Level: 9 Pain Management and Medication Current Pain Management: Medication: No Cold Application: No Rest: No Massage: No Activity: No T.E.N.S.: No Heat Application: No Leg drop or elevation: No Is the Current Pain Management Adequate: Adequate How does your wound impact your activities of daily livingo Sleep: No Bathing: No Appetite:  No Relationship With Others: No Bladder Continence: No Emotions: No Bowel Continence: No Work: No Toileting: No Drive: No Dressing: No Hobbies: No Electronic Signature(s) Signed: 10/30/2019 2:55:42 PM By: Deon Pilling Entered By: Deon Pilling on 10/30/2019 12:58:40 -------------------------------------------------------------------------------- Patient/Caregiver Education Details Patient Name: Date of Service: [name] 12/31/2020andnbsp12:30 PM Medical Record Patient Account Number: [id number] Number: Treating RN: Deon Pilling 09/12/1976 (44 y.o. Other Clinician: Date of Birth/Gender: M) Treating Linton Ham Primary Care Physician: [name], [name] Physician/Extender: Referring Physician: Lujean Amel Weeks in Treatment: 34 Education Assessment Education Provided To: Patient Education Topics Provided Wound/Skin Impairment: Handouts: Skin Care Do's and Dont's Methods: Explain/Verbal Responses: Reinforcements needed Electronic Signature(s) Signed: 10/30/2019 2:55:42 PM By: Deon Pilling Entered By: Deon Pilling on 10/30/2019 13:10:48 -------------------------------------------------------------------------------- Wound Assessment Details Patient Name: Date of Service: [name], [name] 10/30/2019 12:30 PM Medical Record [id number] Patient Account Number: [id number] Date of Birth/Sex: Treating RN: Mar 23, 1976 (44 y.o. [name] Primary Care Suki Crockett: Other Clinician: [name], [name] Referring Rayssa Atha: Treating Margy Sumler/Extender:[name], [name], [name]  Weeks in Treatment: 27 Wound Status Wound Number: 13 Primary Venous Leg Ulcer Etiology: Wound Location: Left Lower Leg - Anterior, Proximal Wound Open Wounding Event: Gradually Appeared Status: Date Acquired: 09/23/2019 Comorbid Asthma, Sleep Apnea, Deep Vein Weeks Of Treatment: 4 History: Thrombosis, Phlebitis, Osteoarthritis, Clustered Wound:  No Neuropathy Photos Wound Measurements Length: (cm) 1 % Redu Width: (cm) 1 % Redu Depth: (cm) 0.1 Epithe Area: (cm) 0.785 Tunne Volume: (cm) 0.079 Under Wound Description Classification: Full Thickness Without Exposed Support Structures Wound Distinct, outline attached Margin: Exudate Medium Amount: Exudate Serosanguineous Type: Exudate red, brown Color: Wound Bed Granulation Amount: Large (67-100%) Granulation Quality: Red Necrotic Amount: Small (1-33%) Necrotic Quality: Adherent Slough Foul Odor After Cleansing: No Slough/Fibrino Yes Exposed Structure Fascia Exposed: No Fat Layer (Subcutaneous Tissue) Exposed: Yes Tendon Exposed: No Muscle Exposed: No Joint Exposed: No Bone Exposed: No ction in Area: 60.8% ction in Volume: 60.5% lialization: Small (1-33%) ling: No mining: No Treatment Notes Wound #13 (Left, Proximal, Anterior Lower Leg) 1. Cleanse With Wound Cleanser Soap and water 2. Periwound Care TCA Cream 3. Primary Dressing Applied Calcium Alginate Ag Other primary dressing (specifiy in notes) 4. Secondary Dressing ABD Pad Dry Gauze Kerramax/Xtrasorb 6. Support Layer Applied 4 layer compression wrap Notes silver alginate to lateral leg wounds and sorbact with hydrogel to anterior wounds Electronic Signature(s) Signed: 11/04/2019 3:29:37 PM By: Mikeal Hawthorne EMT/HBOT Signed: 11/05/2019 5:41:11 PM By: Deon Pilling Previous Signature: 10/30/2019 2:55:42 PM Version By: Deon Pilling Entered By: Mikeal Hawthorne on 11/04/2019 13:05:20 -------------------------------------------------------------------------------- Wound Assessment Details Patient Name: Date of Service: [name], [name] 10/30/2019 12:30 PM Medical Record [id number] Patient Account Number: [id number] Date of Birth/Sex: Treating RN: 1976/06/19 (44 y.o. [name] Primary Care Ziyon Soltau: [name], [name] Other Clinician: Referring Valori Hollenkamp: Treating Danette Weinfeld/Extender:[name],  [name], [name] in Treatment: 27 Wound Status Wound Number: 14 Primary Venous Leg Ulcer Etiology: Wound Location: Left Lower Leg - Lateral, Proximal Wound Open Wounding Event: Blister Status: Date Acquired: 10/30/2019 Comorbid Asthma, Sleep Apnea, Deep Vein Weeks Of Treatment: 0 History: Thrombosis, Phlebitis, Osteoarthritis, Clustered Wound: No Neuropathy Photos Wound Measurements Length: (cm) 1.1 % Red Width: (cm) 1 % Red Depth: (cm) 0.1 Epith Area: (cm) 0.864 Tunn Volume: (cm) 0.086 Unde Wound Description Classification: Full Thickness Without Exposed Support Foul Structures Slou Wound Distinct, outline attached Margin: Exudate Medium Amount: Exudate Serosanguineous Type: Exudate red, brown Color: Wound Bed Granulation Amount: Large (67-100%) Granulation Quality: Pink Necrotic Amount: None Present (0%) Odor After Cleansing: No gh/Fibrino No uction in Area: 0% uction in Volume: 0% elialization: None eling: No rmining: No Treatment Notes Wound #14 (Left, Proximal, Lateral Lower Leg) 1. Cleanse With Wound Cleanser Soap and water 2. Periwound Care TCA Cream 3. Primary Dressing Applied Calcium Alginate Ag Other primary dressing (specifiy in notes) 4. Secondary Dressing ABD Pad Dry Gauze Kerramax/Xtrasorb 6. Support Layer Applied 4 layer compression wrap Notes silver alginate to lateral leg wounds and sorbact with hydrogel to anterior wounds Electronic Signature(s) Signed: 11/04/2019 3:29:37 PM By: Mikeal Hawthorne EMT/HBOT Signed: 11/05/2019 5:41:11 PM By: Deon Pilling Previous Signature: 10/30/2019 2:55:42 PM Version By: Deon Pilling Entered By: Mikeal Hawthorne on 11/04/2019 13:04:58 -------------------------------------------------------------------------------- Wound Assessment Details Patient Name: Date of Service: [name], [name] 10/30/2019 12:30 PM Medical Record [id number] Patient Account Number: [id number] Date of  Birth/Sex: Treating RN: December 29, 1975 (44 y.o. [name] Primary Care Seneca Hoback: [name], [name] Other Clinician: Referring Puneet Selden: Treating Lety Cullens/Extender:[name], [name], [name] in Treatment: 27 Wound Status Wound Number: 15 Primary Venous Leg Ulcer Etiology:  Wound Location: Left Lower Leg - Lateral Wound Open Wounding Event: Blister Status: Date Acquired: 10/23/2019 Comorbid Asthma, Sleep Apnea, Deep Vein Weeks Of Treatment: 0 History: Thrombosis, Phlebitis, Osteoarthritis, Clustered Wound: No Neuropathy Photos Wound Measurements Length: (cm) 2.5 % Reduct Width: (cm) 1.8 % Reduct Depth: (cm) 0.1 Epitheli Area: (cm) 3.534 Tunneli Volume: (cm) 0.353 Undermi Wound Description Classification: Full Thickness Without Exposed Support Foul Odo Structures Slough/F Wound Distinct, outline attached Margin: Exudate Medium Amount: Exudate Serosanguineous Type: Exudate red, brown Color: Wound Bed Granulation Amount: Large (67-100%) Granulation Quality: Red Fascia E Necrotic Amount: None Present (0%) Fat Laye Tendon E Muscle E Joint Ex Bone Exp r After Cleansing: No ibrino No Exposed Structure xposed: No r (Subcutaneous Tissue) Exposed: No xposed: No xposed: No posed: No osed: No ion in Area: 0% ion in Volume: 0% alization: None ng: No ning: No Treatment Notes Wound #15 (Left, Lateral Lower Leg) 1. Cleanse With Wound Cleanser Soap and water 2. Periwound Care TCA Cream 3. Primary Dressing Applied Calcium Alginate Ag Other primary dressing (specifiy in notes) 4. Secondary Dressing ABD Pad Dry Gauze Kerramax/Xtrasorb 6. Support Layer Applied 4 layer compression wrap Notes silver alginate to lateral leg wounds and sorbact with hydrogel to anterior wounds Electronic Signature(s) Signed: 11/04/2019 3:29:37 PM By: Mikeal Hawthorne EMT/HBOT Signed: 11/05/2019 5:41:11 PM By: Deon Pilling Previous Signature: 10/30/2019 2:55:42 PM Version  By: Deon Pilling Entered By: Mikeal Hawthorne on 11/04/2019 11:40:06 -------------------------------------------------------------------------------- Wound Assessment Details Patient Name: Date of Service: [name], SHUGARS 10/30/2019 12:30 PM Medical Record [id number] Patient Account Number: [id number] Date of Birth/Sex: Treating RN: September 06, 1976 (44 y.o. [name] Primary Care Maria Coin: [name], [name] Other Clinician: Referring Bebe Moncure: Treating Debbie Bellucci/Extender:[name], [name], [name] in Treatment: 27 Wound Status Wound Number: [id number] Primary Venous Leg Ulcer Etiology: Wound Location: Left Lower Leg - Anterior Wound Open Wounding Event: Gradually Appeared Status: Date Acquired: 03/31/2019 Comorbid Asthma, Sleep Apnea, Deep Vein Thrombosis, Weeks Of Treatment: 27 History: Phlebitis, Osteoarthritis, Neuropathy Clustered Wound: No Photos Wound Measurements Length: (cm) 2.6 % Red Width: (cm) 1 % Red Depth: (cm) 0.1 Epith Area: (cm) 2.042 Tunn Volume: (cm) 0.204 Unde Wound Description Classification: Full Thickness Without Exposed Support Foul Structures Slou Wound Distinct, outline attached Margin: Exudate Medium Amount: Exudate Serosanguineous Type: Exudate red, brown Color: Wound Bed Granulation Amount: Large (67-100%) Granulation Quality: Red Fasci Necrotic Amount: Small (1-33%) Fat L Tendo Muscl Joint Bone Odor After Cleansing: No gh/Fibrino Yes Exposed Structure a Exposed: No ayer (Subcutaneous Tissue) Exposed: Yes n Exposed: No e Exposed: No Exposed: No Exposed: No uction in Area: 84.5% uction in Volume: 84.5% elialization: Small (1-33%) eling: No rmining: No Treatment Notes Wound #9RR (Left, Anterior Lower Leg) 1. Cleanse With Wound Cleanser Soap and water 2. Periwound Care TCA Cream 3. Primary Dressing Applied Calcium Alginate Ag Other primary dressing (specifiy in notes) 4. Secondary Dressing ABD Pad Dry  Gauze Kerramax/Xtrasorb 6. Support Layer Applied 4 layer compression wrap Notes silver alginate to lateral leg wounds and sorbact with hydrogel to anterior wounds Electronic Signature(s) Signed: 11/04/2019 3:29:37 PM By: Mikeal Hawthorne EMT/HBOT Signed: 11/05/2019 5:41:11 PM By: Deon Pilling Previous Signature: 10/30/2019 2:55:42 PM Version By: Deon Pilling Entered By: Mikeal Hawthorne on 11/04/2019 13:05:42 -------------------------------------------------------------------------------- Vitals Details Patient Name: Date of Service: Wainer, Lynx 10/30/2019 12:30 PM Medical Record [id number] Patient Account Number: [id number] Date of Birth/Sex: Treating RN: 30-Apr-1976 (44 y.o. [name] Primary Care Leylanie Woodmansee: Lujean Amel Other Clinician: Referring Indira Sorenson: Treating Migel Hannis/Extender:[name], [name], [name] in Treatment:  27 Vital Signs Time Taken: 12:55 Temperature (F): 98.9 Height (in): 74 Pulse (bpm): 94 Weight (lbs): 325 Respiratory Rate (breaths/min): 20 Body Mass Index (BMI): 41.7 Blood Pressure (mmHg): 148/84 Reference Range: 80 - 120 mg / dl Electronic Signature(s) Signed: 10/30/2019 2:55:42 PM By: Deon Pilling Entered By: Deon Pilling on 10/30/2019 12:58:59

## 2019-11-07 ENCOUNTER — Other Ambulatory Visit: Payer: Self-pay

## 2019-11-07 ENCOUNTER — Encounter (HOSPITAL_BASED_OUTPATIENT_CLINIC_OR_DEPARTMENT_OTHER): Payer: 59 | Admitting: Internal Medicine

## 2019-11-07 DIAGNOSIS — Z86718 Personal history of other venous thrombosis and embolism: Secondary | ICD-10-CM | POA: Insufficient documentation

## 2019-11-07 DIAGNOSIS — I890 Lymphedema, not elsewhere classified: Secondary | ICD-10-CM | POA: Diagnosis not present

## 2019-11-07 DIAGNOSIS — L97422 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 layer exposed: Secondary | ICD-10-CM | POA: Diagnosis not present

## 2019-11-07 DIAGNOSIS — Z7901 Long term (current) use of anticoagulants: Secondary | ICD-10-CM | POA: Insufficient documentation

## 2019-11-07 DIAGNOSIS — I87322 Chronic venous hypertension (idiopathic) with inflammation of left lower extremity: Secondary | ICD-10-CM | POA: Diagnosis not present

## 2019-11-07 DIAGNOSIS — Z95828 Presence of other vascular implants and grafts: Secondary | ICD-10-CM | POA: Insufficient documentation

## 2019-11-07 DIAGNOSIS — L97821 Non-pressure chronic ulcer of other part of left lower leg limited to breakdown of skin: Secondary | ICD-10-CM | POA: Insufficient documentation

## 2019-11-07 DIAGNOSIS — L97528 Non-pressure chronic ulcer of other part of left foot with other specified severity: Secondary | ICD-10-CM | POA: Diagnosis not present

## 2019-11-07 DIAGNOSIS — D6869 Other thrombophilia: Secondary | ICD-10-CM | POA: Insufficient documentation

## 2019-11-07 DIAGNOSIS — Z86711 Personal history of pulmonary embolism: Secondary | ICD-10-CM | POA: Diagnosis not present

## 2019-11-07 NOTE — Progress Notes (Signed)
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 stockings and are going to attempt to get him compression pumps 9/10-Patient returns at 1 week for the 2 small areas on his left anterior leg, these areas are healed. Patient is now going to going to the 30/40 stockings. His CT scan of his right leg hip and pelvis areas is scheduled 9/22 the patient was discharged from the clinic on 9/10. He tells me that he started developing pain on the left anterior tibial area and swelling on  Thursday of last week. By Saturday the swelling was so prominent that he  was scared to take his stocking off because he would be able to get it back on. There was increased pain. He was seen in the emergency department at [facility] on 9/1. 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 He was also felt to have cellulitis. There was apparently not any acute clot. He I he was put on a 3 time a day medication/antibiotic which I am assuming is clindamycin. He has 2 reopened areas both on the anterior tibia 9/29; he completed the clindamycin even though it caused diarrhea. The diarrhea is resolved. The cellulitis in the left leg that was prominent last week has resolved. 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 I looked in [place] link I do not see the actual CT scan report. He is being apparently scheduled for an attempt at stenting retrograde and then if that does not work anterior grade by Dr. Trula Ore. We use silver alginate to the wound last week because of coexistent cellulitis 10/6; cellulitis on the left leg resolved. He is still waiting for insurance authorization for his pelvic vein stenting. Changed him to [facility] last week 10/13; cellulitis remains resolved. Wound is smaller. Using Hydrofera Blue under compression. In 2 days time he is going for his venous stenting hopefully by Dr. Trula Ore at [facility] 10/20; the patient went for his procedure last Thursday by Dr. Trula Ore although he does not remember in the postop period what he was told. He is not certain whether Dr. Trula Ore managed to get the stent then successfully. His wound is measuring smaller looks healthy on the left mid tibia. He is also  having some discomfort behind his left knee that he had me look at which is the site where Dr. Trula Ore had venous access. I looked in care everywhere. I could not see a specific note from Dr. Trula Ore [[facility]] above the actual procedure. Presumably it is still in transfer therefore I could not really answer his question about whether the stent was placed successful 10/27; wound not quite as good as last week. Using Hydrofera Blue. He thinks it might of stuck to the wound and was adherent when they took it off today. His procedure with Dr. Trula Ore was not successful. He thinks that Dr. Trula Ore will try to go at this from the other direction at some point. He sees him in 2-1/2-week 11/3; quite an improvement in wound surface area this week. We are using Hydrofera Blue. No need to change the dressing. Follows up with Dr. Trula Ore in about a week and a half 11/10; wound surface not as good this week at all. No changes in dimensions. We have been using Hydrofera Blue. As well he had tells [place] he had discomfort in his foot all week although he admits he was up on this more than usual. He comes in today with 2 small punched out areas on the left lateral foot. He has severe venous hypertension. He sees Dr. Trula Ore again on 11/16 11/17; not much change in the area on the anterior tibia mid aspect. He also has 2 areas on the left lateral foot. We have been using Sorbact on the left anterior and Iodosorb ointment on the left foot. He  is under 4-layer compression. The patient went to see Dr. Trula OreMarsden I do not yet have access to this note and care everywhere. However according to the patient there is an option to try and address his venous occlusion I think via a transjugular approach. This is complicated by the fact that the IVC filter is there and would have to be traversed. According to the patient there is about a 33% chance of perforation may be even aortic perforation. However they would be prepared for this.  He is thinking about this and discussing it with his wife Thanks to our case manager this week we are able to determine if for some reason the patient is not using his compression pumps. He also is not able to get stockings on the right leg which are 30/40 below-knee. I think we may need to order external compression garments 12/1; patient arrives with the area on the left anterior mid tibia expanded superiorly small rim of skin between the original area and the new area. He has painful small denuded areas on the left medial and left lateral heel. We have been using Iodoflex. His next appointment with the Dr. Trula OreMarsden is January 4. after the holidays to discuss if he would like to pursue this endovascular option. Dr. Sherrin DaisyMarsdens notes below from last visit Subjective Lance SellMatthew Custodio is a 44 y.o. 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 Unfortunately during the procedure a stent was not able to be placed d/t narrowing at the IVC. Per the patient the procedure provided 2-3 days of symptomatic relief (decrease in L LE pressure and pain) but after that his symptoms returned with increased pressure, pain in the hips and pain in the legs when standing. The patient does report his L LE wound is stable especially when he is consistently attending [place] care [place]. The patient denies using compressive LE therapies unless done at his wound clinic due to not being able to reach his legs. He is interested in learning more and possibly pursing further interventional options for his L lower extremity via access through neck. *Objective Physical Exam: BP 171/86  Pulse 80  Temp 37 C (Temporal)  Wt (!) 147.4 kg (325 lb)  BMI 41.73 kg/m General: awake, alert and  oriented x 3 Chest: Non labored breathing on room air CVS: Normal rate. Regular rhythm. ABD: soft, NT, ND Ext: bilateral leg edema w/ left leg in compressive dressing from wound clinic. Evidence of skin discoloration on both feet bilaterally. Data Review: Labs: None to review Imaging:Reviewed report of 10/15 L LE venogram. I saw and evaluated the patient, participating in the key portions of the service. I reviewed the residentoos note. I agree with the residentoos findings and plan. Electronically signed by Derry SkillWilliam Arnold Marston, MD at 09/16/2019 10:53 AM EST 12/11 the areas around his ankles have healed. 2 areas in the center part of the mid tibia area are still open we have been using silver collagen. [facility]/2019 12/18; the wounds around his ankles remain closed. I think there has been some improvement in the 2 areas in the center part of the mid tibia. Especially superiorly. Debris on the center required debridement we have been using silver collagen 12/31; I saw the  patient briefly last week when he was here for a nurse visit. He had irritation in which look believed to be localized cellulitis and folliculitis on the lateral left calf. I gave him a prescription for cephalexin although he admits today he never filled it. The area still looked very angry. His original wounds we have been treating where a figure-of-eight shaped area on the anterior tibial area mid aspect. We have been using silver collagen these do not look healthy. 11/07/2019. The patient has completed his antibiotics. The area of folliculitis on the left lateral calf looks somewhat better although there is still erythema there is no tenderness. I am going to put some more compression on this area and will have a look at this next week. No additional antibiotics. Since the patient was last here he went to see Dr. Trula Ore at [facility]. I am able to see his note from 11/03/2019 in  care everywhere. The patient has a history of a right iliac vein stent and IVC filter placement. He is status post angioplasty of the left common iliac vein on 08/14/2019 for chronic obstruction and venous insufficiency. Unfortunately a stent could not be placed during the procedure due to narrowing at the IVC from fibrotic change of the right iliac vein stent. As I understand things they are now going to approach this from both sides i.e. through an area in the internal jugular vein as well as the more standard distal approach and see if this area can be stented. The thought was 5 to 6 weeks before this could be arranged as it will "require 2 surgeons, insurance approval etc. The original wound on his anterior tibia looks better. He has the area on the left lateral tibia with localized swelling. He has a new area on the left medial calcaneus which is small punched out and very painful. I think this looks similar to wounds he has had in this area before. He has severe venous hypertension Objective Constitutional Sitting or standing Blood Pressure is within target range for patient.. Pulse regular and within target range for patient.Marland Kitchen Respirations regular, non-labored and within target range.. Temperature is normal and within the target range for the patient.Marland Kitchen Appears in no distress. Vitals Time Taken: 9:23 AM, Height: 74 in, Source: Stated, Weight: 325 lbs, Source: Stated, BMI: 41.7, Temperature: 98.8 F, Pulse: 96 bpm, Respiratory Rate: 18 breaths/min, Blood Pressure: 135/83 mmHg. Cardiovascular Pedal pulses palpable and strong bilaterally.. Do not have good edema control in the left lateral calf. General Notes: Wound exam; ooHis wound in the mid tibia I think looks better. ooThe area on the left lateral calf does not appear to be infected but is still open. We do not have good edema control in this area ooShe has a new area on the left medial calcaneus small punched out area. He has had  these type of wounds before and they are always very painful. I see no evidence of infection here either Integumentary (Hair, Skin) Changes of severe chronic venous insufficiency with hemosiderin deposition and some degree of stasis dermatitis which is significant anteriorly over the tibial area. Wound #13 status is Open. Original cause of wound was Gradually Appeared. The wound is located on the Left,Proximal,Anterior Lower Leg. The wound measures 0.2cm length x 0.2cm width x 0.1cm depth; 0.031cm^2 area and 0.003cm^3 volume. There is Fat Layer (Subcutaneous Tissue) Exposed exposed. There is no tunneling or undermining noted. There is a small amount of serosanguineous drainage noted. The wound margin is distinct with the  outline attached to the wound base. There is large (67-100%) red granulation within the wound bed. There is no necrotic tissue within the wound bed. Wound #14 status is Open. Original cause of wound was Blister. The wound is located on the Left,Proximal,Lateral Lower Leg. The wound measures 0.2cm length x 0.2cm width x 0.2cm depth; 0.031cm^2 area and 0.006cm^3 volume. There is Fat Layer (Subcutaneous Tissue) Exposed exposed. There is no tunneling or undermining noted. There is a small amount of serosanguineous drainage noted. The wound margin is distinct with the outline attached to the wound base. There is large (67-100%) red granulation within the wound bed. There is no necrotic tissue within the wound bed. Wound #15 status is Open. Original cause of wound was Blister. The wound is located on the Left,Lateral Lower Leg. The wound measures 2cm length x 1.5cm width x 0.1cm depth; 2.356cm^2 area and 0.236cm^3 volume. There is Fat Layer (Subcutaneous Tissue) Exposed exposed. There is no tunneling or undermining noted. There is a medium amount of serosanguineous drainage noted. The wound margin is flat and intact. There is large (67-100%) red granulation within the wound bed. There  is a small (1-33%) amount of necrotic tissue within the wound bed including Eschar. Wound #16 status is Open. Original cause of wound was Gradually Appeared. The wound is located on the Left,Medial Calcaneus. The wound measures 0.5cm length x 0.5cm width x 0.1cm depth; 0.196cm^2 area and 0.02cm^3 volume. There is Fat Layer (Subcutaneous Tissue) Exposed exposed. There is no tunneling or undermining noted. There is a small amount of serosanguineous drainage noted. The wound margin is distinct with the outline attached to the wound base. There is medium (34-66%) granulation within the wound bed. There is a medium (34-66%) amount of necrotic tissue within the wound bed including Adherent Slough. Wound #9RR status is Open. Original cause of wound was Gradually Appeared. The wound is located on the Left,Anterior Lower Leg. The wound measures 2.6cm length x 1.5cm width x 0.2cm depth; 3.063cm^2 area and 0.613cm^3 volume. There is Fat Layer (Subcutaneous Tissue) Exposed exposed. There is no tunneling or undermining noted. There is a medium amount of serosanguineous drainage noted. The wound margin is distinct with the outline attached to the wound base. There is large (67-100%) red granulation within the wound bed. There is no necrotic tissue within the wound bed. Assessment Active Problems ICD-10 Non-pressure chronic ulcer of other part of left lower leg limited to breakdown of skin 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 layer exposed Chronic venous hypertension (idiopathic) with inflammation of left lower extremity Non-pressure chronic ulcer of other part of left foot with other specified severity Lymphedema, not elsewhere classified Other thrombophilia Procedures Wound #13 Pre-procedure diagnosis of Wound #13 is a Venous Leg Ulcer located on the Left,Proximal,Anterior Lower Leg . There was a Four Layer Compression Therapy Procedure by [name], RN. Post procedure  Diagnosis Wound #13: Same as Pre-Procedure Wound #14 Pre-procedure diagnosis of Wound #14 is a Venous Leg Ulcer located on the Left,Proximal,Lateral Lower Leg . There was a Four Layer Compression Therapy Procedure by [name], RN. Post procedure Diagnosis Wound #14: Same as Pre-Procedure Wound #15 Pre-procedure diagnosis of Wound #15 is a Venous Leg Ulcer located on the Left,Lateral Lower Leg . There was a Four Layer Compression Therapy Procedure by [name], RN. Post procedure Diagnosis Wound #15: Same as Pre-Procedure Wound #16 Pre-procedure diagnosis of Wound #16 is a Lymphedema located on the Left,Medial Calcaneus . There was a Four  Layer Compression Therapy Procedure by [name], RN. Post procedure Diagnosis Wound #16: Same as Pre-Procedure Wound #9RR Pre-procedure diagnosis of Wound #9RR is a Venous Leg Ulcer located on the Left,Anterior Lower Leg . There was a Four Layer Compression Therapy Procedure by [name], RN. Post procedure Diagnosis Wound #9RR: Same as Pre-Procedure Plan Follow-up Appointments: Return Appointment in 1 week. - Friday Dressing Change Frequency: Do not change entire dressing for one week. Skin Barriers/Peri-Wound Care: TCA Cream or Ointment - to left leg. Wound Cleansing: May shower with protection. Primary Wound Dressing: Wound #13 Left,Proximal,Anterior Lower Leg: Other: - cutimed sorbact swab with hydrogel. Wound #14 Left,Proximal,Lateral Lower Leg: Other: - cutimed sorbact swab with hydrogel. Wound #15 Left,Lateral Lower Leg: Other: - cutimed sorbact swab with hydrogel. Wound #16 Left,Medial Calcaneus: Other: - cutimed sorbact swab with hydrogel. Wound #9RR Left,Anterior Lower Leg: Other: - cutimed sorbact swab with hydrogel. Secondary Dressing: Dry Gauze ABD pad Edema Control: 4 layer compression: Left lower extremity - Add extra folded ABD pad to lateral leg for additional compression Avoid standing for  long periods of time Elevate legs to the level of the heart or above for 30 minutes daily and/or when sitting, a frequency of: Support Garment 30-40 mm/Hg pressure to: - right leg : apply compression stockings dual layer in the am and remove at night. Segmental Compressive Device. - lymphedema pumps 1 hour 2 times per day. Additional Orders / Instructions: Other: - start antibiotic right away and complete the entire course. #1 continue with Sorbac to all wound areas 2. Continue with 4-layer compression 3. I am going to roll up ABD pads in order to compress the left lateral calf and see if we control the amount of swelling here. I do not think he requires additional antibiotics 4. I think we are going to have repeat breakdown in this lower leg. Until some of the severe venous hypertension can be relieved. Electronic Signature(s) Signed: 11/07/2019 4:35:49 PM By: Baltazar Najjar MD Entered By: Baltazar Najjar on 11/07/2019 10:19:10 -------------------------------------------------------------------------------- SuperBill Details Patient Name: Date of Service: ZAYVON, ALICEA 11/07/2019 Medical Record [id number] Patient Account Number: [id number] Date of Birth/Sex: Treating RN: 1975/11/16 (44 y.o. [name] Primary Care Provider: [name], [name] Other Clinician: Referring Provider: Treating Provider/Extender:[name], [name], [name] in Treatment: 28 Diagnosis Coding ICD-10 Codes Code Description L97.821 Non-pressure chronic ulcer of other part of left lower leg limited to breakdown of skin L97.422 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 layer exposed I87.322 Chronic venous hypertension (idiopathic) with inflammation of left lower extremity L97.528 Non-pressure chronic ulcer of other part of left foot with other specified severity I89.0 Lymphedema, not elsewhere classified D68.69 Other thrombophilia Facility Procedures CPT4 Code  Description: [id number] (Facility Use Only) [phone] - APPLY MULTLAY COMPRS LWR LT LEG Modifier: Quantity: 1 Physician Procedures Electronic Signature(s) Signed: 11/07/2019 4:35:49 PM By: Baltazar Najjar MD Entered By: Baltazar Najjar on 11/07/2019 10:19:50

## 2019-11-11 NOTE — Progress Notes (Signed)
[name], [name] ([id number]) Visit Report for 11/07/2019 Arrival Information Details Patient Name: Date of Service: [name], [name] 11/07/2019 9:00 AM Medical Record [id number] Patient Account Number: [id number] Date of Birth/Sex: Treating RN: 03-10-1976 (44 y.o. [name] Primary Care Alistair Senft: [name], [name] Other Clinician: Referring Darryn Kydd: Treating Anshi Jalloh/Extender:[name], [name], [name] in Treatment: 28 Visit Information History Since Last Visit Added or deleted any medications: No Patient Arrived: Ambulatory Any new allergies or adverse reactions: No Arrival Time: 09:22 Had a fall or experienced change in No Accompanied By: self activities of daily living that may affect Transfer Assistance: None risk of falls: Patient Identification Verified: Yes Signs or symptoms of abuse/neglect since last No Secondary Verification Process Completed: Yes visito Patient Requires Transmission-Based No Hospitalized since last visit: No Precautions: Implantable device outside of the clinic excluding No Patient Has Alerts: No cellular tissue based products placed in the center since last visit: Has Dressing in Place as Prescribed: Yes Has Compression in Place as Prescribed: Yes Pain Present Now: Yes Electronic Signature(s) Signed: 11/07/2019 3:29:16 PM By: Baruch Gouty RN, BSN Entered By: Baruch Gouty on 11/07/2019 09:23:20 -------------------------------------------------------------------------------- Compression Therapy Details Patient Name: Date of Service: [name], [name] 11/07/2019 9:00 AM Medical Record [id number] Patient Account Number: [id number] Date of Birth/Sex: Treating RN: 22-Nov-1975 (44 y.o. [name] Primary Care Cache Bills: [name], [name] Other Clinician: Referring Seng Larch: Treating Reham Slabaugh/Extender:[name], [name], [name] in Treatment: 28 Compression Therapy Performed for Wound Wound #13  Left,Proximal,Anterior Lower Leg Assessment: Performed By: Clinician Kela Millin, RN Compression Type: Four Layer Post Procedure Diagnosis Same as Pre-procedure Electronic Signature(s) Signed: 11/07/2019 2:58:22 PM By: Kela Millin Entered By: Kela Millin on 11/07/2019 09:59:47 -------------------------------------------------------------------------------- Compression Therapy Details Patient Name: Date of Service: [name], [name] 11/07/2019 9:00 AM Medical Record [id number] Patient Account Number: [id number] Date of Birth/Sex: Treating RN: 20-Oct-1976 (44 y.o. [name] Primary Care Persia Lintner: [name], [name] Other Clinician: Referring Arla Boutwell: Treating Kenzly Rogoff/Extender:[name], [name], [name] in Treatment: 28 Compression Therapy Performed for Wound Wound #14 Left,Proximal,Lateral Lower Leg Assessment: Performed By: Clinician Kela Millin, RN Compression Type: Four Layer Post Procedure Diagnosis Same as Pre-procedure Electronic Signature(s) Signed: 11/07/2019 2:58:22 PM By: Kela Millin Entered By: Kela Millin on 11/07/2019 09:59:47 -------------------------------------------------------------------------------- Compression Therapy Details Patient Name: Date of Service: [name], [name] 11/07/2019 9:00 AM Medical Record [id number] Patient Account Number: [id number] Date of Birth/Sex: Treating RN: 1976/03/31 (44 y.o. [name] Primary Care Denyce Harr: [name], [name] Other Clinician: Referring Mckyla Deckman: Treating Felina Tello/Extender:[name], [name], [name] in Treatment: 28 Compression Therapy Performed for Wound Wound #15 Left,Lateral Lower Leg Assessment: Performed By: Clinician Kela Millin, RN Compression Type: Four Layer Post Procedure Diagnosis Same as Pre-procedure Electronic Signature(s) Signed: 11/07/2019 2:58:22 PM By: Kela Millin Entered By: Kela Millin on 11/07/2019  09:59:47 -------------------------------------------------------------------------------- Compression Therapy Details Patient Name: Date of Service: [name], [name] 11/07/2019 9:00 AM Medical Record [id number] Patient Account Number: [id number] Date of Birth/Sex: Treating RN: 02/26/1976 (44 y.o. [name] Primary Care Kelie Gainey: [name], [name] Other Clinician: Referring Navika Hoopes: Treating Lokelani Lutes/Extender:[name], [name], [name] in Treatment: 28 Compression Therapy Performed for Wound Wound #16 Left,Medial Calcaneus Assessment: Performed By: Clinician Kela Millin, RN Compression Type: Four Layer Post Procedure Diagnosis Same as Pre-procedure Electronic Signature(s) Signed: 11/07/2019 2:58:22 PM By: Kela Millin Entered By: Kela Millin on 11/07/2019 09:59:47 -------------------------------------------------------------------------------- Compression Therapy Details Patient Name: Date of Service: [name], [name] 11/07/2019 9:00 AM Medical Record [id number] Patient Account Number: [id number] Date of Birth/Sex: Treating RN: 1976-05-17 (44 y.o. [name] Primary Care Monserrate Blaschke:  Koirala, [name] Other Clinician: Referring Salem Lembke: Treating Brendi Mccarroll/Extender:[name], [name], [name] in Treatment: 28 Compression Therapy Performed for Wound Wound #9RR Left,Anterior Lower Leg Assessment: Performed By: Clinician Kela Millin, RN Compression Type: Four Layer Post Procedure Diagnosis Same as Pre-procedure Electronic Signature(s) Signed: 11/07/2019 2:58:22 PM By: Kela Millin Entered By: Kela Millin on 11/07/2019 09:59:47 -------------------------------------------------------------------------------- Encounter Discharge Information Details Patient Name: Date of Service: [name], [name] 11/07/2019 9:00 AM Medical Record [id number] Patient Account Number: [id number] Date of Birth/Sex: Treating  RN: 1976/03/15 (44 y.o. [name] Primary Care Quentavious Rittenhouse: [name], [name] Other Clinician: Referring Linville Decarolis: Treating Janelle Culton/Extender:[name], [name], [name] in Treatment: 28 Encounter Discharge Information Items Discharge Condition: Stable Ambulatory Status: Ambulatory Discharge Destination: Home Transportation: Private Auto Accompanied By: alone Schedule Follow-up Appointment: Yes Clinical Summary of Care: Patient Declined Electronic Signature(s) Signed: 11/11/2019 6:18:44 PM By: [name] RN, BSN Entered By: [name] on 11/07/2019 10:27:58 -------------------------------------------------------------------------------- Lower Extremity Assessment Details Patient Name: Date of Service: Loomer, Markelle 11/07/2019 9:00 AM Medical Record [id number] Patient Account Number: [id number] Date of Birth/Sex: Treating RN: 10/06/1976 (44 y.o. [name] Primary Care Betsie Peckman: [name], [name] Other Clinician: Referring Gunhild Bautch: Treating Kimbella Heisler/Extender:[name], [name], [name] in Treatment: 28 Edema Assessment Assessed: [Left: No] [Right: No] Edema: [Left: Ye] [Right: s] Calf Left: Right: Point of Measurement: 35 cm From Medial Instep 47 cm cm Ankle Left: Right: Point of Measurement: 11.5 cm From Medial Instep 24.8 cm cm Vascular Assessment Pulses: Dorsalis Pedis Palpable: [Left:Yes] Electronic Signature(s) Signed: 11/07/2019 3:29:16 PM By: Baruch Gouty RN, BSN Entered By: Baruch Gouty on 11/07/2019 09:37:11 -------------------------------------------------------------------------------- Multi Wound Chart Details Patient Name: Date of Service: MISTY, RAGO 11/07/2019 9:00 AM Medical Record [id number] Patient Account Number: [id number] Date of Birth/Sex: Treating RN: 08-12-1976 (44 y.o. M) Primary Care Deiondra Denley: [name], [name] Other Clinician: Referring Carla Whilden: Treating Shameca Landen/Extender:[name],  [name], [name] in Treatment: 28 Vital Signs Height(in): 74 Pulse(bpm): 96 Weight(lbs): 325 Blood Pressure(mmHg): 135/83 Body Mass Index(BMI): 42 Temperature(F): 98.8 Respiratory 18 Rate(breaths/min): Photos: [13:No Photos] [14:No Photos] [15:No Photos] Wound Location: [13:Left Lower Leg - Anterior, Left Lower Leg - Lateral, Proximal] [14:Proximal] [15:Left Lower Leg - Lateral] Wounding Event: [13:Gradually Appeared] [14:Blister] [15:Blister] Primary Etiology: [13:Venous Leg Ulcer] [14:Venous Leg Ulcer] [15:Venous Leg Ulcer] Comorbid History: [13:Asthma, Sleep Apnea, DeepAsthma, Sleep Apnea, DeepAsthma, Sleep Apnea, Deep Vein Thrombosis, Phlebitis, Vein Thrombosis, Phlebitis, Vein Thrombosis, Phlebitis, Osteoarthritis, Neuropathy Osteoarthritis, Neuropathy Osteoarthritis,  Neuropathy] Date Acquired: [13:09/23/2019] [14:10/30/2019] [15:10/23/2019] Weeks of Treatment: [13:5] [14:1] [15:1] Wound Status: [13:Open] [14:Open] [15:Open] Wound Recurrence: [13:No] [14:No] [15:No] Measurements L x W x D 0.2x0.2x0.1 [14:0.2x0.2x0.2] [15:2x1.5x0.1] (cm) Area (cm) : [13:0.031] [14:0.031] [15:2.356] Volume (cm) : [13:0.003] [14:0.006] [15:0.236] % Reduction in Area: [13:98.50%] [14:96.40%] [15:33.30%] % Reduction in Volume: 98.50% [14:93.00%] [15:33.10%] Classification: [13:Full Thickness Without Exposed Support Structures Exposed Support Structures Exposed Support Structures] [14:Full Thickness Without] [15:Full Thickness Without] Exudate Amount: [13:Small] [14:Small] [15:Medium] Exudate Type: [13:Serosanguineous] [14:Serosanguineous] [15:Serosanguineous] Exudate Color: [13:red, brown] [14:red, brown] [15:red, brown] Wound Margin: [13:Distinct, outline attached Distinct, outline attached Flat and Intact] Granulation Amount: [13:Large (67-100%)] [14:Large (67-100%)] [15:Large (67-100%)] Granulation Quality: [13:Red] [14:Red] [15:Red] Necrotic Amount: [13:None Present (0%)]  [14:None Present (0%)] [15:Small (1-33%)] Necrotic Tissue: [13:N/A] [14:N/A] [15:Eschar] Exposed Structures: [13:Fat Layer (Subcutaneous Tissue) Exposed: Yes Fascia: No Tendon: No Muscle: No Joint: No Bone: No] [14:Fat Layer (Subcutaneous Tissue) Exposed: Yes Fascia: No Tendon: No Muscle: No Joint: No Bone: No] [15:Fat Layer (Subcutaneous Tissue) Exposed: Yes  Fascia: No Tendon: No Muscle: No Joint: No Bone: No] Epithelialization: [  13:Small (1-33%)] [14:Medium (34-66%)] [15:Small (1-33%)] Procedures Performed: [13:Compression Therapy 16] [14:Compression Therapy 9RR] [15:Compression Therapy N/A] Photos: [13:No Photos] [14:No Photos] [15:N/A] Wound Location: [13:Left Calcaneus - Medial] [14:Left Lower Leg - Anterior] [15:N/A] Wounding Event: [13:Gradually Appeared] [14:Gradually Appeared] [15:N/A] Primary Etiology: [13:Lymphedema] [14:Venous Leg Ulcer] [15:N/A] Comorbid History: [13:Asthma, Sleep Apnea, DeepAsthma, Sleep Apnea, DeepN/A Vein Thrombosis, Phlebitis, Vein Thrombosis, Phlebitis, Osteoarthritis, Neuropathy Osteoarthritis, Neuropathy] Date Acquired: [13:11/07/2019] [14:03/31/2019] [15:N/A] Weeks of Treatment: [13:0] [14:28] [15:N/A] Wound Status: [13:Open] [14:Open] [15:N/A] Wound Recurrence: [13:No] [14:Yes] [15:N/A] Measurements L x W x D 0.5x0.5x0.1 [14:2.6x1.5x0.2] [15:N/A] (cm) Area (cm) : [13:0.196] [14:3.063] [15:N/A] Volume (cm) : [13:0.02] [14:0.613] [15:N/A] % Reduction in Area: [13:N/A] [14:76.80%] [15:N/A] % Reduction in Volume: N/A [14:53.50%] [15:N/A] Classification: [13:Full Thickness Without Exposed Support Structures Exposed Support Structures] [14:Full Thickness Without] [15:N/A] Exudate Amount: [13:Small] [14:Medium] [15:N/A] Exudate Type: [13:Serosanguineous] [14:Serosanguineous] [15:N/A] Exudate Color: [13:red, brown] [14:red, brown] [15:N/A] Wound Margin: [13:Distinct, outline attached Distinct, outline attached N/A] Granulation Amount: [13:Medium (34-66%)]  [14:Large (67-100%)] [15:N/A] Granulation Quality: [13:N/A] [14:Red] [15:N/A] Necrotic Amount: [13:Medium (34-66%)] [14:None Present (0%)] [15:N/A] Necrotic Tissue: [13:Adherent Slough] [14:N/A] [15:N/A] Exposed Structures: [13:Fat Layer (Subcutaneous Fat Layer (Subcutaneous N/A Tissue) Exposed: Yes Fascia: No Tendon: No Muscle: No Joint: No Bone: No] [14:Tissue) Exposed: Yes Fascia: No Tendon: No Muscle: No Joint: No Bone: No] Epithelialization: [13:None] [14:None Compression Therapy] [15:N/A N/A] Treatment Notes Electronic Signature(s) Signed: 11/07/2019 4:35:49 PM By: Linton Ham MD Entered By: Linton Ham on 11/07/2019 10:13:00 -------------------------------------------------------------------------------- Multi-Disciplinary Care Plan Details Patient Name: Date of Service: BRAXDON, GAPPA 11/07/2019 9:00 AM Medical Record [id number] Patient Account Number: [id number] Date of Birth/Sex: Treating RN: 1976/05/14 (44 y.o. [name] Primary Care Dreshaun Stene: [name], [name] Other Clinician: Referring Annalicia Renfrew: Treating Damareon Lanni/Extender:[name], [name], [name] in Treatment: 28 Active Inactive Wound/Skin Impairment Nursing Diagnoses: Knowledge deficit related to ulceration/compromised skin integrity Goals: Patient/caregiver will verbalize understanding of skin care regimen Date Initiated: 07/22/2019 Target Resolution Date: 12/05/2019 Goal Status: Active Ulcer/skin breakdown will have a volume reduction of 30% by week 4 Date Inactivated: 08/26/2019 Target Resolution Date Initiated: 07/22/2019 Date: 08/22/2019 Goal Status: Met Ulcer/skin breakdown will have a volume reduction of 50% by week 8 Date Initiated: 08/26/2019 Date Inactivated: 09/30/2019 Target Resolution Date: 09/19/2019 Goal Status: Unmet Unmet Reason: comorbities Ulcer/skin breakdown will have a volume reduction of 80% by week 12 Date Initiated: 09/30/2019 Target Resolution Date:  12/05/2019 Goal Status: Active Interventions: Assess patient/caregiver ability to obtain necessary supplies Assess patient/caregiver ability to perform ulcer/skin care regimen upon admission and as needed Assess ulceration(s) every visit Notes: Electronic Signature(s) Signed: 11/07/2019 2:58:22 PM By: Kela Millin Entered By: Kela Millin on 11/07/2019 09:17:50 -------------------------------------------------------------------------------- Pain Assessment Details Patient Name: Date of Service: AURYN, PAIGE 11/07/2019 9:00 AM Medical Record [id number] Patient Account Number: [id number] Date of Birth/Sex: Treating RN: May 07, 1976 (44 y.o. [name] Primary Care Engelbert Sevin: [name], [name] Other Clinician: Referring Thaddius Manes: Treating Raynor Calcaterra/Extender:[name], [name], [name] in Treatment: 28 Active Problems Location of Pain Severity and Description of Pain Patient Has Paino Yes Site Locations Pain Location: Pain in Ulcers With Dressing Change: Yes Duration of the Pain. Constant / Intermittento Constant Rate the pain. Current Pain Level: 5 Character of Pain Describe the Pain: Aching Pain Management and Medication Current Pain Management: Medication: Yes Is the Current Pain Management Adequate: Adequate Rest: Yes How does your wound impact your activities of daily livingo Sleep: No Bathing: No Appetite: No Relationship With Others: No Bladder Continence: No Emotions: No Bowel Continence: No Work: No Toileting:  No Drive: No Dressing: No Hobbies: No Electronic Signature(s) Signed: 11/07/2019 3:29:16 PM By: Baruch Gouty RN, BSN Entered By: Baruch Gouty on 11/07/2019 41:93:79 -------------------------------------------------------------------------------- Patient/Caregiver Education Details Patient Name: Date of Service: Isaacs, Jemari 1/8/2021andnbsp9:00 AM Medical Record [id number] Patient Account Number: [id number] Date of  Birth/Gender: October 09, 1976 (43 y.o. M) Treating RN: Kela Millin Primary Care Physician: [name], [name] Other Clinician: Referring Physician: Treating Physician/Extender:[name], [name], [name] in Treatment: 88 Education Assessment Education Provided To: Patient Education Topics Provided Wound/Skin Impairment: Methods: Explain/Verbal Responses: State content correctly Electronic Signature(s) Signed: 11/07/2019 2:58:22 PM By: Kela Millin Entered By: Kela Millin on 11/07/2019 09:18:13 -------------------------------------------------------------------------------- Wound Assessment Details Patient Name: Date of Service: JOSHVA, LABRECK 11/07/2019 9:00 AM Medical Record [id number] Patient Account Number: [id number] Date of Birth/Sex: Treating RN: [birth year] (44 y.o. [name] Primary Care Agness Sibrian: [name], [name] Other Clinician: Referring Kaily Wragg: Treating Logun Colavito/Extender:[name], [name], [name] in Treatment: 28 Wound Status Wound Number: 13 Primary Venous Leg Ulcer Etiology: Wound Location: Left Lower Leg - Anterior, Proximal Wound Open Wounding Event: Gradually Appeared Status: Date Acquired: 09/23/2019 Comorbid Asthma, Sleep Apnea, Deep Vein Weeks Of Treatment: 5 History: Thrombosis, Phlebitis, Osteoarthritis, Clustered Wound: No Neuropathy Photos Wound Measurements Length: (cm) 0.2 % Reduc Width: (cm) 0.2 % Reduc Depth: (cm) 0.1 Epithel Area: (cm) 0.031 Tunnel Volume: (cm) 0.003 Underm Wound Description Classification: Full Thickness Without Exposed Support Foul Odo Structures Slough/F Wound Distinct, outline attached Margin: Exudate Small Amount: Exudate Serosanguineous Type: Exudate red, brown Color: Wound Bed Granulation Amount: Large (67-100%) Granulation Quality: Red Fascia E Necrotic Amount: None Present (0%) Fat Laye Tendon E Muscle E Joint Ex Bone Exp r After Cleansing: No ibrino  Yes Exposed Structure xposed: No r (Subcutaneous Tissue) Exposed: Yes xposed: No xposed: No posed: No osed: No tion in Area: 98.5% tion in Volume: 98.5% ialization: Small (1-33%) ing: No ining: No Treatment Notes Wound #13 (Left, Proximal, Anterior Lower Leg) 1. Cleanse With Soap and water 2. Periwound Care TCA Ointment 3. Primary Dressing Applied Other primary dressing (specifiy in notes) 4. Secondary Dressing ABD Pad Dry Gauze 6. Support Layer Applied 4 layer compression wrap Notes sorbact with hydrogel as Programmer, applications) Signed: 11/11/2019 3:22:35 PM By: Mikeal Hawthorne EMT/HBOT Signed: 11/11/2019 6:12:59 PM By: Baruch Gouty RN, BSN Previous Signature: 11/07/2019 3:29:16 PM Version By: Baruch Gouty RN, BSN Entered By: Mikeal Hawthorne on 11/11/2019 14:17:24 -------------------------------------------------------------------------------- Wound Assessment Details Patient Name: Date of Service: DREDEN, RIVERE 11/07/2019 9:00 AM Medical Record [id number] Patient Account Number: [id number] Date of Birth/Sex: Treating RN: Oct 13, 1976 (44 y.o. [name] Primary Care Ohanna Gassert: [name], [name] Other Clinician: Referring Cheila Wickstrom: Treating Marcayla Budge/Extender:[name], [name], [name] in Treatment: 28 Wound Status Wound Number: 14 Primary Venous Leg Ulcer Etiology: Wound Location: Left Lower Leg - Lateral, Proximal Wound Open Wounding Event: Blister Status: Date Acquired: 10/30/2019 Comorbid Asthma, Sleep Apnea, Deep Vein Weeks Of Treatment: 1 History: Thrombosis, Phlebitis, Osteoarthritis, Clustered Wound: No Neuropathy Photos Wound Measurements Length: (cm) 0.2 % Reduct Width: (cm) 0.2 % Reduct Depth: (cm) 0.2 Epitheli Area: (cm) 0.031 Tunneli Volume: (cm) 0.006 Undermi Wound Description Classification: Full Thickness Without Exposed Support Foul Odo Structures Slough/F Wound Distinct, outline  attached Margin: Exudate Small Amount: Exudate Serosanguineous Type: Exudate red, brown Color: Wound Bed Granulation Amount: Large (67-100%) Granulation Quality: Red Fascia E Necrotic Amount: None Present (0%) Fat Laye Tendon E Muscle E Joint Ex Bone Exp r After Cleansing: No ibrino No Exposed Structure xposed: No r (Subcutaneous Tissue) Exposed: Yes xposed: No  xposed: No posed: No osed: No ion in Area: 96.4% ion in Volume: 93% alization: Medium (34-66%) ng: No ning: No Treatment Notes Wound #14 (Left, Proximal, Lateral Lower Leg) 1. Cleanse With Soap and water 2. Periwound Care TCA Ointment 3. Primary Dressing Applied Other primary dressing (specifiy in notes) 4. Secondary Dressing ABD Pad Dry Gauze 6. Support Layer Applied 4 layer compression wrap Notes sorbact with hydrogel as Programmer, applications) Signed: 11/11/2019 3:22:35 PM By: Mikeal Hawthorne EMT/HBOT Signed: 11/11/2019 6:12:59 PM By: Baruch Gouty RN, BSN Previous Signature: 11/07/2019 3:29:16 PM Version By: Baruch Gouty RN, BSN Entered By: Mikeal Hawthorne on 11/11/2019 14:17:48 -------------------------------------------------------------------------------- Wound Assessment Details Patient Name: Date of Service: CLERENCE, GUBSER 11/07/2019 9:00 AM Medical Record [id number] Patient Account Number: [id number] Date of Birth/Sex: Treating RN: Sep 20, 1976 (43 y.o. [name] Primary Care Charnell Peplinski: [name], [name] Other Clinician: Referring Reuven Braver: Treating Desa Rech/Extender:[name], [name], [name] in Treatment: 28 Wound Status Wound Number: 15 Primary Venous Leg Ulcer Etiology: Wound Location: Left Lower Leg - Lateral Wound Open Wounding Event: Blister Status: Date Acquired: 10/23/2019 Comorbid Asthma, Sleep Apnea, Deep Vein Weeks Of Treatment: 1 History: Thrombosis, Phlebitis, Osteoarthritis, Clustered Wound: No Neuropathy Photos Wound  Measurements Length: (cm) 2 % Reduct Width: (cm) 1.5 % Reduct Depth: (cm) 0.1 Epitheli Area: (cm) 2.356 Tunneli Volume: (cm) 0.236 Undermi Wound Description Classification: Full Thickness Without Exposed Support Foul Odo Structures Slough/F Wound Flat and Intact Margin: Exudate Medium Amount: Exudate Serosanguineous Type: Exudate red, brown Color: Wound Bed Granulation Amount: Large (67-100%) Granulation Quality: Red Fascia E Necrotic Amount: Small (1-33%) Fat Laye Necrotic Quality: Eschar Tendon E Muscle E Joint Ex Bone Exp r After Cleansing: No ibrino No Exposed Structure xposed: No r (Subcutaneous Tissue) Exposed: Yes xposed: No xposed: No posed: No osed: No ion in Area: 33.3% ion in Volume: 33.1% alization: Small (1-33%) ng: No ning: No Treatment Notes Wound #15 (Left, Lateral Lower Leg) 1. Cleanse With Soap and water 2. Periwound Care TCA Ointment 3. Primary Dressing Applied Other primary dressing (specifiy in notes) 4. Secondary Dressing ABD Pad Dry Gauze 6. Support Layer Applied 4 layer compression wrap Notes sorbact with hydrogel as Programmer, applications) Signed: 11/11/2019 3:22:35 PM By: Mikeal Hawthorne EMT/HBOT Signed: 11/11/2019 6:12:59 PM By: Baruch Gouty RN, BSN Previous Signature: 11/07/2019 3:29:16 PM Version By: Baruch Gouty RN, BSN Entered By: Mikeal Hawthorne on 11/11/2019 14:18:14 -------------------------------------------------------------------------------- Wound Assessment Details Patient Name: Date of Service: QUAID, YEAKLE 11/07/2019 9:00 AM Medical Record [id number] Patient Account Number: [id number] Date of Birth/Sex: Treating RN: 1976/05/05 (44 y.o. [name] Primary Care Shellee Streng: [name], [name] Other Clinician: Referring Rumor Sun: Treating Jaremy Nosal/Extender:[name], [name], [name] in Treatment: 28 Wound Status Wound Number: 16 Primary Lymphedema Etiology: Wound Location: Left  Calcaneus - Medial Wound Open Wounding Event: Gradually Appeared Status: Status: Date Acquired: 11/07/2019 Comorbid Asthma, Sleep Apnea, Deep Vein Weeks Of Treatment: 0 History: Thrombosis, Phlebitis, Osteoarthritis, Clustered Wound: No Neuropathy Photos Wound Measurements Length: (cm) 0.5 % Reduct Width: (cm) 0.5 % Reduct Depth: (cm) 0.1 Epitheli Area: (cm) 0.196 Tunneli Volume: (cm) 0.02 Undermi Wound Description Full Thickness Without Exposed Support Foul Odo Classification: Structures Slough/F Wound Distinct, outline attached Margin: Exudate Small Amount: Exudate Serosanguineous Type: Exudate red, brown Color: Wound Bed Granulation Amount: Medium (34-66%) Necrotic Amount: Medium (34-66%) Fascia E Necrotic Quality: Adherent Slough Fat Laye Tendon E Muscle E Joint Ex Bone Exp r After Cleansing: No ibrino No Exposed Structure xposed: No r (Subcutaneous Tissue) Exposed: Yes xposed: No xposed: No  posed: No osed: No ion in Area: 0% ion in Volume: 0% alization: None ng: No ning: No Treatment Notes Wound #16 (Left, Medial Calcaneus) 1. Cleanse With Soap and water 2. Periwound Care TCA Ointment 3. Primary Dressing Applied Other primary dressing (specifiy in notes) 4. Secondary Dressing ABD Pad Dry Gauze 6. Support Layer Applied 4 layer compression wrap Notes sorbact with hydrogel as Programmer, applications) Signed: 11/11/2019 3:22:35 PM By: Mikeal Hawthorne EMT/HBOT Signed: 11/11/2019 6:12:59 PM By: Baruch Gouty RN, BSN Previous Signature: 11/07/2019 3:29:16 PM Version By: Baruch Gouty RN, BSN Entered By: Mikeal Hawthorne on 11/11/2019 14:05:33 -------------------------------------------------------------------------------- Wound Assessment Details Patient Name: Date of Service: RAHEEN, CAPILI 11/07/2019 9:00 AM Medical Record [id number] Patient Account Number: [id number] Date of Birth/Sex: Treating RN: 1976/01/30 (44 y.o. [name] Primary Care Oseph Imburgia: [name], [name] Other Clinician: Referring Lateisha Thurlow: Treating Kindsey Eblin/Extender:[name], [name], [name] in Treatment: 28 Wound Status Wound Number: [id number] Primary Venous Leg Ulcer Etiology: Wound Location: Left Lower Leg - Anterior Wound Open Wounding Event: Gradually Appeared Status: Date Acquired: 03/31/2019 Comorbid Asthma, Sleep Apnea, Deep Vein Thrombosis, Weeks Of Treatment: 28 History: Phlebitis, Osteoarthritis, Neuropathy Clustered Wound: No Photos Wound Measurements Length: (cm) 2.6 % Reduct Width: (cm) 1.5 % Reduct Depth: (cm) 0.2 Epitheli Area: (cm) 3.063 Tunneli Volume: (cm) 0.613 Undermi Wound Description Full Thickness Without Exposed Support Foul Odo Classification: Structures Slough/F Wound Distinct, outline attached Margin: Exudate Medium Amount: Exudate Serosanguineous Type: Exudate red, brown red, brown Color: Wound Bed Granulation Amount: Large (67-100%) Granulation Quality: Red Fascia Ex Necrotic Amount: None Present (0%) Fat Layer Tendon Ex Muscle Ex Joint Exp Bone Expo r After Cleansing: No ibrino Yes Exposed Structure posed: No (Subcutaneous Tissue) Exposed: Yes posed: No posed: No osed: No sed: No ion in Area: 76.8% ion in Volume: 53.5% alization: None ng: No ning: No Treatment Notes Wound #[id number] (Left, Anterior Lower Leg) 1. Cleanse With Soap and water 2. Periwound Care TCA Ointment 3. Primary Dressing Applied Other primary dressing (specifiy in notes) 4. Secondary Dressing ABD Pad Dry Gauze 6. Support Layer Applied 4 layer compression wrap Notes sorbact with hydrogel as Programmer, applications) Signed: 11/11/2019 3:22:35 PM By: Mikeal Hawthorne EMT/HBOT Signed: 11/11/2019 6:12:59 PM By: Baruch Gouty RN, BSN Previous Signature: 11/07/2019 3:29:16 PM Version By: Baruch Gouty RN, BSN Entered By: Mikeal Hawthorne on 11/11/2019  14:10:58 -------------------------------------------------------------------------------- [place] Details Patient Name: Date of Service: Blucher, Avaneesh 11/07/2019 9:00 AM Medical Record [id number] Patient Account Number: [id number] Date of Birth/Sex: Treating RN: January 21, 1976 (44 y.o. [name] Primary Care Kinsey Cowsert: [name], [name] Other Clinician: Referring Shirlyn Savin: Treating Dayton Sherr/Extender:[name], [name], [name] in Treatment: 28 Vital Signs Time Taken: 09:23 Temperature (F): 98.8 Height (in): 74 Pulse (bpm): 96 Source: Stated Respiratory Rate (breaths/min): 18 Weight (lbs): 325 Blood Pressure (mmHg): 135/83 Source: Stated Reference Range: 80 - 120 mg / dl Body Mass Index (BMI): 41.7 Electronic Signature(s) Signed: 11/07/2019 3:29:16 PM By: Baruch Gouty RN, BSN Entered By: Baruch Gouty on 11/07/2019 09:23:52

## 2019-11-14 ENCOUNTER — Encounter (HOSPITAL_BASED_OUTPATIENT_CLINIC_OR_DEPARTMENT_OTHER): Payer: 59 | Admitting: Internal Medicine

## 2019-11-14 ENCOUNTER — Other Ambulatory Visit: Payer: Self-pay

## 2019-11-14 ENCOUNTER — Other Ambulatory Visit (HOSPITAL_COMMUNITY)
Admission: RE | Admit: 2019-11-14 | Discharge: 2019-11-14 | Disposition: A | Discharge status: Home / Self Care | Payer: 59 | Source: Other Acute Inpatient Hospital | Attending: Internal Medicine | Admitting: Internal Medicine

## 2019-11-14 DIAGNOSIS — L97422 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 layer exposed: Secondary | ICD-10-CM | POA: Insufficient documentation

## 2019-11-14 DIAGNOSIS — I87322 Chronic venous hypertension (idiopathic) with inflammation of left lower extremity: Secondary | ICD-10-CM | POA: Diagnosis not present

## 2019-11-14 NOTE — Progress Notes (Signed)
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 Ore. We have ordered him 30/40 stockings and are going to attempt to get him compression pumps 9/10-Patient returns at 1 week for the 2 small areas on his left anterior leg, these areas are healed. Patient is now going to going to the 30/40 stockings. His CT scan of his right leg hip and pelvis areas is scheduled 9/22 the patient was discharged from the clinic on 9/10. He tells me that he started  developing pain on the left anterior tibial area and swelling  on Thursday of last week. By Saturday the swelling was so prominent that he was scared to take his stocking off because he would be able to get it back on. There was increased pain. He was seen in the emergency department at [facility] on 9/1. 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 He was also felt to have cellulitis. There was apparently not any acute clot. He I he was put on a 3 time a day medication/antibiotic which I am assuming is clindamycin. He has 2 reopened areas both on the anterior tibia 9/29; he completed the clindamycin even though it caused diarrhea. The diarrhea is resolved. The cellulitis in the left leg that was prominent last week has resolved. 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 I looked in [place] link I do not see the actual CT scan report. He is being apparently scheduled for an attempt at stenting retrograde and then if that does not work anterior grade by Dr. Trula Ore. We use silver alginate to the wound last week because of coexistent cellulitis 10/6; cellulitis on the left leg resolved. He is still waiting for insurance authorization for his pelvic vein stenting. Changed him to [facility] last week 10/13; cellulitis remains resolved. Wound is smaller. Using Hydrofera Blue under compression. In 2 days time he is going for his venous stenting hopefully by Dr. Trula Ore at [facility] 10/20; the patient went for his procedure last Thursday by Dr. Trula Ore although he does not remember in the postop period what he was told. He is not certain whether Dr. Trula Ore managed to get the stent then successfully. His wound is  measuring smaller looks healthy on the left mid tibia. He is also having some discomfort behind his left knee that he had me look at which is the site where Dr. Trula Ore had venous access. I looked in care everywhere. I could not see a specific note from Dr. Trula Ore [[facility]] above the actual procedure. Presumably it is still in transfer therefore I could not really answer his question about whether the stent was placed successful 10/27; wound not quite as good as last week. Using Hydrofera Blue. He thinks it might of stuck to the wound and was adherent when they took it off today. His procedure with Dr. Trula Ore was not successful. He thinks that Dr. Trula Ore will try to go at this from the other direction at some point. He sees him in 2-1/2-week 11/3; quite an improvement in wound surface area this week. We are using Hydrofera Blue. No need to change the dressing. Follows up with Dr. Trula Ore in about a week and a half 11/10; wound surface not as good this week at all. No changes in dimensions. We have been using Hydrofera Blue. As well he had tells [place] he had discomfort in his foot all week although he admits he was up on this more than usual. He comes in today with 2 small punched out areas on the left lateral foot. He has severe venous hypertension. He sees Dr. Trula Ore again on 11/16 11/17; not much change in the area on the anterior tibia mid aspect. He also has 2 areas on the left lateral foot. We have been  using Sorbact on the left anterior and Iodosorb ointment on the left foot. He is under 4-layer compression. The patient went to see Dr. Trula Ore I do not yet have access to this note and care everywhere. However according to the patient there is an option to try and address his venous occlusion I think via a transjugular approach. This is complicated by the fact that the IVC filter is there and would have to be traversed. According to the patient there is about a 33% chance of perforation may be  even aortic perforation. However they would be prepared for this. He is thinking about this and discussing it with his wife Thanks to our case manager this week we are able to determine if for some reason the patient is not using his compression pumps. He also is not able to get stockings on the right leg which are 30/40 below-knee. I think we may need to order external compression garments 12/1; patient arrives with the area on the left anterior mid tibia expanded superiorly small rim of skin between the original area and the new area. He has painful small denuded areas on the left medial and left lateral heel. We have been using Iodoflex. His next appointment with the Dr. Trula Ore is January 4. after the holidays to discuss if he would like to pursue this endovascular option. Dr. Sherrin Daisy notes below from last visit Subjective [name] is a 44 y.o. 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 Unfortunately during the procedure a stent was not able to be placed d/t narrowing at the IVC. Per the patient the procedure provided 2-3 days of symptomatic relief (decrease in L LE pressure and pain) but after that his symptoms returned with increased pressure, pain in the hips and pain in the legs when standing. The patient does report his L LE wound is stable especially when he is consistently attending his wound care clinic in [place]. The patient denies using compressive LE therapies unless done at his wound clinic due to not being able to reach his legs. He is interested in learning more and possibly pursing further interventional options for his L lower extremity via access through neck. *Objective Physical Exam: BP 171/86  Pulse 80  Temp 37 C (Temporal)  Wt (!)  147.4 kg (325 lb)  BMI 41.73 kg/m General: awake, alert and oriented x 3 Chest: Non labored breathing on room air CVS: Normal rate. Regular rhythm. ABD: soft, NT, ND Ext: bilateral leg edema w/ left leg in compressive dressing from wound clinic. Evidence of skin discoloration on both feet bilaterally. Data Review: Labs: None to review Imaging:Reviewed report of 10/15 L LE venogram. I saw and evaluated the patient, participating in the key portions of the service. I reviewed the residentoos note. I agree with the residentoos findings and plan. Electronically signed by Derry Skill, MD at 09/16/2019 10:53 AM EST 12/11 the areas around his ankles have healed. 2 areas in the center part of the mid tibia area are still open we have been using silver collagen. Procedure attempt by Dr. Trula Ore at [facility] on 11/03/2019 12/18; the wounds around his ankles remain closed. I think there has been some improvement in the 2 areas in the center part of the mid tibia. Especially superiorly. Debris on  the center required debridement we have been using silver collagen 12/31; I saw the patient briefly last week when he was here for a nurse visit. He had irritation in which look believed to be localized cellulitis and folliculitis on the lateral left calf. I gave him a prescription for cephalexin although he admits today he never filled it. The area still looked very angry. His original wounds we have been treating where a figure-of-eight shaped area on the anterior tibial area mid aspect. We have been using silver collagen these do not look healthy. 11/07/2019. The patient has completed his antibiotics. The area of folliculitis on the left lateral calf looks somewhat better although there is still erythema there is no tenderness. I am going to put some more compression on this area and will have a look at this next week. No additional antibiotics. Since the patient was last here he went to see Dr.  [facility]ula Ore at [facility]. I am able to see his note from 11/03/2019 in care everywhere. The patient has a history of a right iliac vein stent and IVC filter placement. He is status post angioplasty of the left common iliac vein on 08/14/2019 for chronic obstruction and venous insufficiency. Unfortunately a stent could not be placed during the procedure due to narrowing at the IVC from fibrotic change of the right iliac vein stent. As I understand things they are now going to approach this from both sides i.e. through an area in the internal jugular vein as well as the more standard distal approach and see if this area can be stented. The thought was 5 to 6 weeks before this could be arranged as it will "require 2 surgeons, insurance approval etc. The original wound on his anterior tibia looks better. He has the area on the left lateral tibia with localized swelling. He has a new area on the left medial calcaneus which is small punched out and very painful. I think this looks similar to wounds he has had in this area before. He has severe venous hypertension 1/15; anterior tibia wound has come down from a figure-of-eight to 1 wound. Has some depth which looks clean. The area on the left lateral calf looks better. We put additional compression in here and the edema in this area looks better. His major complaint is on the medial calcaneus. He had mentioned this last week and he has had wounds like this before. Pain is severe Objective Constitutional Patient is febrile today.. Vitals Time Taken: 8:11 AM, Height: 74 in, Weight: 325 lbs, BMI: 41.7, Temperature: 98.5 F. General Notes: Wound exam ooArea in the mid tibia looks better. We are down to a single open area previously a figure-of-eight. He has superficial areas laterally in the calf but I think this is better as well new ooVery tender area on the medial calcaneus. Some erythema going distally I have marked this but the tenderness also goes  superiorly. Integumentary (Hair, Skin) Wound #13 status is Open. Original cause of wound was Gradually Appeared. The wound is located on the Left,Proximal,Anterior Lower Leg. The wound measures 0.5cm length x 0.4cm width x 0.1cm depth; 0.157cm^2 area and 0.016cm^3 volume. There is Fat Layer (Subcutaneous Tissue) Exposed exposed. There is no tunneling or undermining noted. There is a small amount of serosanguineous drainage noted. The wound margin is distinct with the outline attached to the wound base. There is medium (34-66%) red granulation within the wound bed. There is a medium (34-66%) amount of necrotic tissue within the wound bed  including Adherent Slough. Wound #14 status is Open. Original cause of wound was Blister. The wound is located on the Left,Proximal,Lateral Lower Leg. The wound measures 1cm length x 0.5cm width x 0.1cm depth; 0.393cm^2 area and 0.039cm^3 volume. There is Fat Layer (Subcutaneous Tissue) Exposed exposed. There is no tunneling or undermining noted. There is a small amount of serosanguineous drainage noted. The wound margin is distinct with the outline attached to the wound base. There is medium (34-66%) red granulation within the wound bed. There is a medium (34-66%) amount of necrotic tissue within the wound bed including Adherent Slough. Wound #15 status is Open. Original cause of wound was Blister. The wound is located on the Left,Lateral Lower Leg. The wound measures 0.5cm length x 0.2cm width x 0.1cm depth; 0.079cm^2 area and 0.008cm^3 volume. There is Fat Layer (Subcutaneous Tissue) Exposed exposed. There is no tunneling or undermining noted. There is a medium amount of serosanguineous drainage noted. The wound margin is flat and intact. There is medium (34-66%) red granulation within the wound bed. There is a medium (34-66%) amount of necrotic tissue within the wound bed including Adherent Slough. Wound #16 status is Open. Original cause of wound was  Gradually Appeared. The wound is located on the Left,Medial Calcaneus. The wound measures 0.6cm length x 0.5cm width x 0.3cm depth; 0.236cm^2 area and 0.071cm^3 volume. There is Fat Layer (Subcutaneous Tissue) Exposed exposed. There is no tunneling or undermining noted. There is a small amount of serosanguineous drainage noted. The wound margin is distinct with the outline attached to the wound base. There is medium (34-66%) granulation within the wound bed. There is a medium (34-66%) amount of necrotic tissue within the wound bed including Adherent Slough. Wound #9RR status is Open. Original cause of wound was Gradually Appeared. The wound is located on the Left,Anterior Lower Leg. The wound measures 2.5cm length x 1.5cm width x 0.4cm depth; 2.945cm^2 area and 1.178cm^3 volume. There is Fat Layer (Subcutaneous Tissue) Exposed exposed. There is no tunneling or undermining noted. There is a medium amount of serosanguineous drainage noted. The wound margin is distinct with the outline attached to the wound base. There is medium (34-66%) red granulation within the wound bed. There is a medium (34-66%) amount of necrotic tissue within the wound bed. Assessment Active Problems ICD-10 Non-pressure chronic ulcer of other part of left lower leg limited to breakdown of skin 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 layer exposed Chronic venous hypertension (idiopathic) with inflammation of left lower extremity Non-pressure chronic ulcer of other part of left foot with other specified severity Lymphedema, not elsewhere classified Other thrombophilia Procedures Wound #13 Pre-procedure diagnosis of Wound #13 is a Venous Leg Ulcer located on the Left,Proximal,Anterior Lower Leg . There was a Four Layer Compression Therapy Procedure by Zandra AbtsLynch, Shatara, RN. Post procedure Diagnosis Wound #13: Same as Pre-Procedure Plan Follow-up Appointments: Return Appointment in 1 week. -  Friday Dressing Change Frequency: Do not change entire dressing for one week. Skin Barriers/Peri-Wound Care: TCA Cream or Ointment - to left leg. Wound Cleansing: May shower with protection. Primary Wound Dressing: Wound #13 Left,Proximal,Anterior Lower Leg: Other: - cutimed sorbact swab with hydrogel. Wound #14 Left,Proximal,Lateral Lower Leg: Other: - cutimed sorbact swab with hydrogel. Wound #15 Left,Lateral Lower Leg: Other: - cutimed sorbact swab with hydrogel. Wound #16 Left,Medial Calcaneus: Other: - cutimed sorbact swab with hydrogel. Wound #9RR Left,Anterior Lower Leg: Other: - cutimed sorbact swab with hydrogel. Secondary Dressing: Dry Gauze ABD pad - extra  ABD pad to lateral leg for increased compression Edema Control: 4 layer compression: Left lower extremity - Add extra folded ABD pad to lateral leg for additional compression Avoid standing for long periods of time Elevate legs to the level of the heart or above for 30 minutes daily and/or when sitting, a frequency of: Support Garment 30-40 mm/Hg pressure to: - right leg : apply compression stockings dual layer in the am and remove at night. Segmental Compressive Device. - lymphedema pumps 1 hour 2 times per day. Additional Orders / Instructions: Other: - start antibiotic right away and complete the entire course. The following medication(s) was prescribed: doxycycline monohydrate oral 100 mg capsule 1 capsule oral bid for 7days for celluitis left foot starting 11/14/2019 1. We are continuing with Sorbact all wound areas 2. This is a patient with severe venous hypertension which is central in origin. We are still awaiting insurance approval for Dr. Trula Ore to attempt for stenting of his stenosed area. 3. I do not think the patient would be able to maintain skin integrity with anything less than are full compression wraps i.e. I cannot see a stocking that will maintain this although I wonder about above-knee 4.  Doxycycline 100 twice daily for 7 days. I am not sure he has cellulitis in the left heel but the degree of inflammation and tenderness over a wide area makes me concerned. Doxycycline would also act as an anti- inflammatory. 5. I did a culture of the small area in the left medial heel but I am not sure that this is exactly representative Electronic Signature(s) Signed: 11/14/2019 6:20:19 PM By: Baltazar Najjar MD Entered By: Baltazar Najjar on 11/14/2019 08:58:12 -------------------------------------------------------------------------------- SuperBill Details Patient Name: Date of Service: [name], [name] 11/14/2019 Medical Record [id number] Patient Account Number: [id number] Date of Birth/Sex: Treating RN: Nov 03, 1975 (44 y.o. [name] Primary Care Provider: [name], [name] Other Clinician: Referring Provider: Treating Provider/Extender:Charde Macfarlane, [name], [name] in Treatment: 29 Diagnosis Coding ICD-10 Codes Code Description L97.821 Non-pressure chronic ulcer of other part of left lower leg limited to breakdown of skin L97.422 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 layer exposed I87.322 Chronic venous hypertension (idiopathic) with inflammation of left lower extremity L97.528 Non-pressure chronic ulcer of other part of left foot with other specified severity I89.0 Lymphedema, not elsewhere classified D68.69 Other thrombophilia Facility Procedures CPT4 Code Description: [id number] (Facility Use Only) [phone] - APPLY MULTLAY COMPRS LWR LT LEG Modifier: Quantity: 1 Physician Procedures CPT4 Code Description: [id number] - WC PHYS LEVEL 4 - EST PT ICD-10 Diagnosis Description L97.821 Non-pressure chronic ulcer of other part of left lower leg skin L97.528 Non-pressure chronic ulcer of other part of left foot with I87.322 Chronic venous  hypertension (idiopathic) with inflammation I89.0 Lymphedema, not elsewhere classified Modifier: limited to  breakdo other specified se of left lower extr Quantity: 1 wn of verity emity Electronic Signature(s) Signed: 11/14/2019 6:20:19 PM By: Baltazar Najjar MD Entered By: Baltazar Najjar on 11/14/2019 08:58:52

## 2019-11-16 LAB — AEROBIC CULTURE W GRAM STAIN (SUPERFICIAL SPECIMEN): Gram Stain: NONE SEEN

## 2019-11-16 LAB — AEROBIC CULTURE? (SUPERFICIAL SPECIMEN)

## 2019-11-19 NOTE — Progress Notes (Signed)
[name], [name] ([id number]) Visit Report for 11/14/2019 Arrival Information Details Patient Name: Date of Service: [name], [name] 11/14/2019 8:00 AM Medical Record [id number] Patient Account Number: [id number] Date of Birth/Sex: Treating [name]: 03-26-1976 (43 y.o. [name]) [name] Primary Care [name]: [name] Other Clinician: Referring [name]: Treating [name] in Treatment: 29 Visit Information History Since Last Visit All ordered tests and consults were completed: No Patient Arrived: Ambulatory Added or deleted any medications: No Arrival Time: 08:06 Any new allergies or adverse reactions: No Accompanied By: self Had a fall or experienced change in No Transfer Assistance: None activities of daily living that may affect Patient Identification Verified: Yes risk of falls: Secondary Verification Process Completed: Yes Signs or symptoms of abuse/neglect since last No Patient Requires Transmission-Based No visito Precautions: Hospitalized since last visit: No Patient Has Alerts: No Implantable device outside of the clinic excluding No cellular tissue based products placed in the center since last visit: Has Dressing in Place as Prescribed: Yes Has Compression in Place as Prescribed: Yes Pain Present Now: Yes Electronic Signature(s) Signed: 11/19/2019 5:42:11 PM By: [name] Entered By: [name] on [name] 08:11:31 -------------------------------------------------------------------------------- Compression Therapy Details Patient Name: Date of Service: [name], [name] 11/14/2019 8:00 AM Medical Record [id number] Patient Account Number: [id number] Date of Birth/Sex: Treating [name]: 08-05-1976 (44 y.o. [name] Primary Care [name]: [name] Other Clinician: Referring Azaleah Usman: Treating [name]/Extender:[name] [name] in Treatment: 29 Compression Therapy Performed  for Wound Wound #13 Left,Proximal,Anterior Lower Leg Assessment: Performed By: Clinician [name] Hurst, [name] Compression Type: Four Layer Post Procedure Diagnosis Same as Pre-procedure Electronic Signature(s) Signed: 11/14/2019 6:26:16 PM By: [name] Gouty [name], [name] Entered By: [name] on [name] 08:45:50 -------------------------------------------------------------------------------- Encounter Discharge Information Details Patient Name: Date of Service: [name], [name] 11/14/2019 8:00 AM Medical Record [id number] Patient Account Number: [id number] Date of Birth/Sex: Treating [name]: 1976-09-19 (44 y.o. [name] Primary Care [name]: [name] Other Clinician: Referring [name]: Treating [name]/Extender:[name] [name] in Treatment: 29 Encounter Discharge Information Items Discharge Condition: Stable Ambulatory Status: Ambulatory Discharge Destination: Home Transportation: Private Auto Accompanied By: alone Schedule Follow-up Appointment: Yes Clinical Summary of Care: Patient Declined Electronic Signature(s) Signed: 11/17/2019 6:02:32 PM By: [name] Entered By: [name] 09:16:39 -------------------------------------------------------------------------------- Lower Extremity Assessment Details Patient Name: Date of Service: [name], [name] 11/14/2019 8:00 AM Medical Record [id number] Patient Account Number: [id number] Date of Birth/Sex: Treating [name]: 1975-11-02 (43 y.o. [name]) [name] Primary Care [name]: [name] Other Clinician: Referring [name]: Treating [name]/Extender:[name] [name] in Treatment: 29 Edema Assessment Assessed: [Left: No] [Right: No] Edema: [Left: Ye] [Right: s] Calf Left: Right: Point of Measurement: 35 cm From Medial Instep 44 cm cm Ankle Left: Right: Point of Measurement: 11.5 cm From Medial Instep 24 cm cm Electronic  Signature(s) Signed: 11/19/2019 5:42:11 PM By: [name] Entered By: [name] on [name] 08:30:53 -------------------------------------------------------------------------------- Multi Wound Chart Details Patient Name: Date of Service: [name], [name] 11/14/2019 8:00 AM Medical Record [id number] Patient Account Number: [id number] Date of Birth/Sex: Treating [name]: December 04, 1975 (44 y.o. M) Primary Care Khristian Seals: [name] Other Clinician: Referring [name]: Treating [name] [name] in Treatment: 29 Vital Signs Height(in): 74 Pulse(bpm): Weight(lbs): 325 Blood Pressure(mmHg): Body Mass Index(BMI): 42 Temperature(F): 98.5 Respiratory Rate(breaths/min): Photos: [13:No Photos] [14:No Photos] [15:No Photos] Wound Location: [13:Left Lower Leg - Anterior, Left Lower Leg - Lateral, Proximal] [14:Proximal] [15:Left Lower Leg - Lateral] Wounding Event: [13:Gradually Appeared] [14:Blister] [15:Blister]  Primary Etiology: [13:Venous Leg Ulcer] [14:Venous Leg Ulcer] [15:Venous Leg Ulcer] Comorbid History: [13:Asthma, Sleep Apnea, DeepAsthma, Sleep Apnea, DeepAsthma, Sleep Apnea, Deep Vein Thrombosis, Phlebitis, Vein Thrombosis, Phlebitis, Vein Thrombosis, Phlebitis, Osteoarthritis, Neuropathy Osteoarthritis, Neuropathy Osteoarthritis,  Neuropathy] Date Acquired: [13:09/23/2019] [14:10/30/2019] [15:10/23/2019] [name] of Treatment: [13:6] [14:2] [15:2] Wound Status: [13:Open] [14:Open] [15:Open] Wound Recurrence: [13:No] [14:No] [15:No] Measurements L x W x D 0.5x0.4x0.1 [14:1x0.5x0.1] [15:0.5x0.2x0.1] (cm) Area (cm) : [13:0.157] [14:0.393] [15:0.079] Volume (cm) : [13:0.016] [14:0.039] [15:0.008] % Reduction in Area: [13:92.20%] [14:54.50%] [15:97.80%] % Reduction in Volume: 92.00% [14:54.70%] [15:97.70%] Classification: [13:Full Thickness Without Exposed Support Structures Exposed Support Structures Exposed Support Structures]  [14:Full Thickness Without] [15:Full Thickness Without] Exudate Amount: [13:Small] [14:Small] [15:Medium] Exudate Type: [13:Serosanguineous] [14:Serosanguineous] [15:Serosanguineous] Exudate Color: [13:red, brown] [14:red, brown] [15:red, brown] Wound Margin: [13:Distinct, outline attached] [14:Distinct, outline attached] [15:Flat and Intact] Granulation Amount: [13:Medium (34-66%)] [14:Medium (34-66%)] [15:Medium (34-66%)] Granulation Quality: [13:Red] [14:Red] [15:Red] Necrotic Amount: [13:Medium (34-66%)] [14:Medium (34-66%)] [15:Medium (34-66%)] Exposed Structures: [13:Fat Layer (Subcutaneous Tissue) Exposed: Yes Fascia: No Tendon: No Muscle: No Joint: No Bone: No] [14:Fat Layer (Subcutaneous Tissue) Exposed: Yes Fascia: No Tendon: No Muscle: No Joint: No Bone: No] [15:Fat Layer (Subcutaneous Tissue) Exposed: Yes  Fascia: No Tendon: No Muscle: No Joint: No Bone: No] Epithelialization: [13:Small (1-33%)] [14:Medium (34-66%)] [15:Small (1-33%)] Procedures Performed: [13:Compression Therapy] [14:N/A 6 9RR] [15:N/A] Photos: [13:No Photos] [14:No Photos] [15:N/A] Wound Location: [13:Left Calcaneus - Medial] [14:Left Lower Leg - Anterior] [15:N/A] Wounding Event: [13:Gradually Appeared] [14:Gradually Appeared] [15:N/A] Primary Etiology: [13:Lymphedema] [14:Venous Leg Ulcer] [15:N/A] Comorbid History: [13:Asthma, Sleep Apnea, DeepAsthma, Sleep Apnea, DeepN/A Vein Thrombosis, Phlebitis, Vein Thrombosis, Phlebitis, Osteoarthritis, Neuropathy Osteoarthritis, Neuropathy] Date Acquired: [13:11/07/2019] [14:03/31/2019] [15:N/A] [name] of Treatment: [13:1] [14:29] [15:N/A] Wound Status: [13:Open] [14:Open] [15:N/A] Wound Recurrence: [13:No] [14:Yes] [15:N/A] Measurements L x W x D 0.6x0.5x0.3 [14:2.5x1.5x0.4] [15:N/A] (cm) Area (cm) : [13:0.236] [14:2.945] [15:N/A] Volume (cm) : [13:0.071] [14:1.178] [15:N/A] % Reduction in Area: [13:-20.40%] [14:77.70%] [15:N/A] % Reduction in Volume: -255.00%  [14:10.70%] [15:N/A] Classification: [13:Full Thickness Without Exposed Support Structures Exposed Support Structures] [14:Full Thickness Without] [15:N/A] Exudate Amount: [13:Small] [14:Medium] [15:N/A] Exudate Type: [13:Serosanguineous] [14:Serosanguineous] [15:N/A] Exudate Color: [13:red, brown] [14:red, brown] [15:N/A] Wound Margin: [13:Distinct, outline attached Distinct, outline attached N/A] Granulation Amount: [13:Medium (34-66%)] [14:Medium (34-66%)] [15:N/A] Granulation Quality: [13:N/A] [14:Red] [15:N/A] Necrotic Amount: [13:Medium (34-66%)] [14:Medium (34-66%)] [15:N/A] Exposed Structures: [13:Fat Layer (Subcutaneous Fat Layer (Subcutaneous N/A Tissue) Exposed: Yes Fascia: No Tendon: No Muscle: No Joint: No Bone: No] [14:Tissue) Exposed: Yes Fascia: No Tendon: No Muscle: No Joint: No Bone: No] Epithelialization: [13:None] [14:None N/A] [15:N/A N/A] Treatment Notes Electronic Signature(s) Signed: 11/14/2019 6:20:19 PM By: Linton Ham MD Entered By: Linton Ham on [name] 08:49:14 -------------------------------------------------------------------------------- Multi-Disciplinary Care Plan Details Patient Name: Date of Service: [name], [name] 11/14/2019 8:00 AM Medical Record [id number] Patient Account Number: [id number] Date of Birth/Sex: Treating [name]: 1976/07/20 (44 y.o. [name] Primary Care Provider: Dorthy [name], [name] Other Clinician: Referring Provider: Treating Provider/Extender:[name] [name] in Treatment: 29 Active Inactive Venous Leg Ulcer Nursing Diagnoses: Actual venous Insuffiency (use after diagnosis is confirmed) Knowledge deficit related to disease process and management Goals: Patient will maintain optimal edema control Date Initiated: 11/14/2019 Target Resolution Date: 12/12/2019 Goal Status: Active Interventions: Assess peripheral edema status every visit. Compression as ordered Treatment  Activities: Therapeutic compression applied : 11/14/2019 Notes: Wound/Skin Impairment Nursing Diagnoses: Knowledge deficit related to ulceration/compromised skin integrity Goals: Patient/caregiver will verbalize understanding of skin care regimen Date Initiated: 07/22/2019 Target Resolution Date: 12/05/2019 Goal Status: Active  Ulcer/skin breakdown will have a volume reduction of 30% by week 4 Target Resolution Date Initiated: 07/22/2019 Date Inactivated: 08/26/2019 Date: 08/22/2019 Goal Status: Met Ulcer/skin breakdown will have a volume reduction of 50% by week 8 Target Resolution Date Initiated: 08/26/2019 Date Inactivated: 09/30/2019 Date: 09/19/2019 Goal Status: Unmet Unmet Reason: comorbities Ulcer/skin breakdown will have a volume reduction of 80% by week 12 Date Initiated: 09/30/2019 Target Resolution Date: 12/05/2019 Goal Status: Active Interventions: Assess patient/caregiver ability to obtain necessary supplies Assess patient/caregiver ability to perform ulcer/skin care regimen upon admission and as needed Assess ulceration(s) every visit Notes: Electronic Signature(s) Signed: 11/14/2019 6:26:16 PM By: [name] Gouty [name], [name] Entered By: [name] on [name] 08:43:59 -------------------------------------------------------------------------------- Pain Assessment Details Patient Name: Date of Service: [name], [name] 11/14/2019 8:00 AM Medical Record [id number] Patient Account Number: [id number] Date of Birth/Sex: Treating [name]: July 21, 1976 (43 y.o. Oval Linsey Primary Care Eiden Bagot: [name] Other Clinician: Referring Sadonna Kotara: Treating Brondon Wann/Extender:[name] [name] in Treatment: 29 Active Problems Location of Pain Severity and Description of Pain Patient Has Paino Yes Site Locations Pain Location: Pain in Ulcers With Dressing Change: Yes Duration of the Pain. Constant / Intermittento Constant Rate the pain. Current  Pain Level: 8 Worst Pain Level: 10 Least Pain Level: 3 Tolerable Pain Level: 5 Character of Pain Describe the Pain: Sharp, Throbbing Pain Management and Medication Current Pain Management: Medication: Yes Cold Application: No Rest: Yes Massage: No Activity: No T.E.N.S.: No Heat Application: No Leg drop or elevation: No Is the Current Pain Management Adequate: Inadequate How does your wound impact your activities of daily livingo Sleep: Yes Bathing: No Appetite: Yes Relationship With Others: No Bladder Continence: No Emotions: No Bowel Continence: No Work: No Toileting: No Drive: No Dressing: No Hobbies: No Electronic Signature(s) Signed: 11/19/2019 5:42:11 PM By: [name] Entered By: [name] on [name] 08:12:29 -------------------------------------------------------------------------------- Patient/Caregiver Education Details Patient Name: Date of Service: Niebuhr, Timouthy 1/15/2021andnbsp8:00 AM Medical Record [phone] Patient Account Number: [id number] Date of Birth/Gender: February 16, 1976 (44 y.o. M) Treating [name]: [name] Primary Care Physician: [name] Other Clinician: Referring Physician: Treating Physician/Extender:[name] [name] in Treatment: 23 Education Assessment Education Provided To: Patient Education Topics Provided Venous: Methods: Explain/Verbal Responses: Reinforcements needed, State content correctly Wound/Skin Impairment: Methods: Explain/Verbal Responses: Reinforcements needed, State content correctly Electronic Signature(s) Signed: 11/14/2019 6:26:16 PM By: [name] Gouty [name], [name] Entered By: [name] on [name] 08:45:28 -------------------------------------------------------------------------------- Wound Assessment Details Patient Name: Date of Service: [name], [name] 11/14/2019 8:00 AM Medical Record [id number] Patient Account Number: [id number] Date of  Birth/Sex: Treating [name]: 01-31-1976 (43 y.o. Oval Linsey Primary Care Analynn Daum: [name] Other Clinician: Referring Mariateresa Batra: Treating Dyllon Henken/Extender:[name] [name] in Treatment: 29 Wound Status Wound Number: 13 Primary Venous Leg Ulcer Etiology: Wound Location: Left Lower Leg - Anterior, Proximal Wound Open Wounding Event: Gradually Appeared Status: Date Acquired: 09/23/2019 Comorbid Asthma, Sleep Apnea, Deep Vein [name] Of Treatment: 6 History: Thrombosis, Phlebitis, Osteoarthritis, Clustered Wound: No Neuropathy Photos Wound Measurements Length: (cm) 0.5 % Reduc Width: (cm) 0.4 % Reduc Depth: (cm) 0.1 Epithel Area: (cm) 0.157 Tunnel Volume: (cm) 0.016 Underm Wound Description Classification: Full Thickness Without Exposed Support Structures Wound Distinct, outline attached Margin: Exudate Small Amount: Exudate Serosanguineous Type: Exudate red, brown Color: Wound Bed Granulation Amount: Medium (34-66%) Granulation Quality: Red Necrotic Amount: Medium (34-66%) Necrotic Quality: Adherent Slough Foul Odor After Cleansing: No Slough/Fibrino Yes Exposed Structure Fascia Exposed: No Fat Layer (Subcutaneous Tissue) Exposed: Yes Tendon Exposed: No Muscle  Exposed: No Joint Exposed: No Bone Exposed: No tion in Area: 92.2% tion in Volume: 92% ialization: Small (1-33%) ing: No ining: No Treatment Notes Wound #13 (Left, Proximal, Anterior Lower Leg) 1. Cleanse With Soap and water 2. Periwound Care TCA Ointment 3. Primary Dressing Applied Other primary dressing (specifiy in notes) 4. Secondary Dressing ABD Pad Dry Gauze 6. Support Layer Applied 4 layer compression wrap Notes Dealer) Signed: 11/18/2019 4:37:08 PM By: Mikeal Hawthorne EMT/HBOT Signed: 11/19/2019 5:42:11 PM By: [name] Entered By: Mikeal Hawthorne on 11/17/2019  15:09:59 -------------------------------------------------------------------------------- Wound Assessment Details Patient Name: Date of Service: [name], [name] 11/14/2019 8:00 AM Medical Record [id number] Patient Account Number: [id number] Date of Birth/Sex: Treating [name]: 01/02/1976 (43 y.o. Oval Linsey Primary Care Alvera Tourigny: [name] Other Clinician: Referring Chevie Birkhead: Treating Spruha Weight/Extender:[name] [name] in Treatment: 29 Wound Status Wound Number: 14 Primary Venous Leg Ulcer Etiology: Wound Location: Left Lower Leg - Lateral, Proximal Wound Open Wounding Event: Blister Status: Date Acquired: 10/30/2019 Comorbid Asthma, Sleep Apnea, Deep Vein [name] Of Treatment: 2 History: Thrombosis, Phlebitis, Osteoarthritis, Clustered Wound: No Neuropathy Photos Wound Measurements Length: (cm) 1 % Reduct Width: (cm) 0.5 % Reduct Depth: (cm) 0.1 Epitheli Area: (cm) 0.393 Tunneli Volume: (cm) 0.039 Undermi Wound Description Classification: Full Thickness Without Exposed Support Foul Odo Structures Slough/F Wound Distinct, outline attached Margin: Exudate Small Amount: Exudate Serosanguineous Type: Exudate red, brown Color: Wound Bed Granulation Amount: Medium (34-66%) Granulation Quality: Red Fascia E Necrotic Amount: Medium (34-66%) Fat Laye Necrotic Quality: Adherent Slough Tendon E Muscle E Joint Ex Bone Exp r After Cleansing: No ibrino Yes Exposed Structure xposed: No r (Subcutaneous Tissue) Exposed: Yes xposed: No xposed: No posed: No osed: No ion in Area: 54.5% ion in Volume: 54.7% alization: Medium (34-66%) ng: No ning: No Treatment Notes Wound #14 (Left, Proximal, Lateral Lower Leg) 1. Cleanse With Soap and water 2. Periwound Care TCA Ointment 3. Primary Dressing Applied Other primary dressing (specifiy in notes) 4. Secondary Dressing ABD Pad Dry Gauze 6. Support Layer Applied 4 layer compression  wrap Notes Dealer) Signed: 11/18/2019 4:37:08 PM By: Mikeal Hawthorne EMT/HBOT Signed: 11/19/2019 5:42:11 PM By: [name] Entered By: Mikeal Hawthorne on 11/17/2019 15:13:16 -------------------------------------------------------------------------------- Wound Assessment Details Patient Name: Date of Service: [name], [name] 11/14/2019 8:00 AM Medical Record [id number] Patient Account Number: [id number] Date of Birth/Sex: Treating [name]: Feb 25, 1976 (43 y.o. Oval Linsey Primary Care Amaar Oshita: [name] Other Clinician: Referring Toryn Mcclinton: Treating Tomasa Dobransky/Extender:[name] [name] in Treatment: 29 Wound Status Wound Number: 15 Primary Venous Leg Ulcer Etiology: Wound Location: Left Lower Leg - Lateral Wound Open Wounding Event: Blister Status: Date Acquired: 10/23/2019 Comorbid Asthma, Sleep Apnea, Deep Vein [name] Of Treatment: 2 History: Thrombosis, Phlebitis, Osteoarthritis, Clustered Wound: No Neuropathy Photos Wound Measurements Length: (cm) 0.5 % Reduct Width: (cm) 0.2 % Reduct Depth: (cm) 0.1 Epitheli Area: (cm) 0.079 Tunneli Volume: (cm) 0.008 Undermi Wound Description Classification: Full Thickness Without Exposed Support Foul Odo Structures Slough/F Wound Flat and Intact Margin: Exudate Medium Amount: Exudate Serosanguineous Type: Exudate red, brown Color: Wound Bed Granulation Amount: Medium (34-66%) Granulation Quality: Red Fascia E Necrotic Amount: Medium (34-66%) Fat Laye Necrotic Quality: Adherent Slough Tendon E Muscle E Joint Ex Bone Exp r After Cleansing: No ibrino Yes Exposed Structure xposed: No r (Subcutaneous Tissue) Exposed: Yes xposed: No xposed: No posed: No osed: No ion in Area: 97.8% ion in Volume: 97.7% alization: Small (1-33%) ng: No ning: No Treatment Notes  Wound #15 (Left, Lateral Lower Leg) 1. Cleanse With Soap and water 2. Periwound Care TCA  Ointment 3. Primary Dressing Applied Other primary dressing (specifiy in notes) 4. Secondary Dressing ABD Pad Dry Gauze 6. Support Layer Applied 4 layer compression wrap Notes Dealer) Signed: 11/18/2019 4:37:08 PM By: Mikeal Hawthorne EMT/HBOT Signed: 11/19/2019 5:42:11 PM By: [name] Entered By: Mikeal Hawthorne on 11/17/2019 15:14:44 -------------------------------------------------------------------------------- Wound Assessment Details Patient Name: Date of Service: [name], [name] 11/14/2019 8:00 AM Medical Record [id number] Patient Account Number: [id number] Date of Birth/Sex: Treating [name]: August 13, 1976 (43 y.o. Oval Linsey Primary Care Provider: Dorthy [name], [name] Other Clinician: Referring Provider: Treating Provider/Extender:[name] [name] in Treatment: 29 Wound Status Wound Number: 16 Primary Lymphedema Etiology: Wound Location: Left Calcaneus - Medial Wound Open Wounding Event: Gradually Appeared Status: Date Acquired: 11/07/2019 Comorbid Asthma, Sleep Apnea, Deep Vein [name] Of Treatment: 1 History: Thrombosis, Phlebitis, Osteoarthritis, Clustered Wound: No Neuropathy Photos Wound Measurements Length: (cm) 0.6 % Reduct Width: (cm) 0.5 % Reduct Depth: (cm) 0.3 Epitheli Area: (cm) 0.236 Tunneli Volume: (cm) 0.071 Undermi Wound Description Classification: Full Thickness Without Exposed Support Foul Odo Structures Slough/F Wound Distinct, outline attached Margin: Exudate Small Amount: Exudate Serosanguineous Type: Exudate Exudate red, brown Color: Wound Bed Granulation Amount: Medium (34-66%) Necrotic Amount: Medium (34-66%) Fascia Ex Necrotic Quality: Adherent Slough Fat Layer Tendon Ex Muscle Ex Joint Exp Bone Expo r After Cleansing: No ibrino No Exposed Structure posed: No (Subcutaneous Tissue) Exposed: Yes posed: No posed: No osed: No sed: No ion in Area: -20.4% ion in  Volume: -255% alization: None ng: No ning: No Treatment Notes Wound #16 (Left, Medial Calcaneus) 1. Cleanse With Soap and water 2. Periwound Care TCA Ointment 3. Primary Dressing Applied Other primary dressing (specifiy in notes) 4. Secondary Dressing ABD Pad Dry Gauze 6. Support Layer Applied 4 layer compression wrap Notes Dealer) Signed: 11/18/2019 4:37:08 PM By: Mikeal Hawthorne EMT/HBOT Signed: 11/19/2019 5:42:11 PM By: [name] Entered By: Mikeal Hawthorne on 11/17/2019 15:18:13 -------------------------------------------------------------------------------- Wound Assessment Details Patient Name: Date of Service: [name], [name] 11/14/2019 8:00 AM Medical Record [id number] Patient Account Number: [id number] Date of Birth/Sex: Treating [name]: 07/31/1976 (43 y.o. [name]) [name] Primary Care Provider: Dorthy [name], [name] Other Clinician: Referring Provider: Treating Provider/Extender:[name] [name] in Treatment: 29 Wound Status Wound Number: [id number] Primary Venous Leg Ulcer Etiology: Wound Location: Left Lower Leg - Anterior Wound Open Wounding Event: Gradually Appeared Status: Date Acquired: 03/31/2019 Comorbid Asthma, Sleep Apnea, Deep Vein Thrombosis, [name] Of Treatment: 29 History: Phlebitis, Osteoarthritis, Neuropathy Clustered Wound: No Photos Wound Measurements Length: (cm) 2.5 % Reduct Width: (cm) 1.5 % Reduct Depth: (cm) 0.4 Epitheli Area: (cm) 2.945 Tunneli Volume: (cm) 1.178 Undermi Wound Description Full Thickness Without Exposed Support Foul Odo Classification: Structures Slough/F Wound Distinct, outline attached Margin: Exudate Medium Amount: Exudate Serosanguineous Type: Exudate red, brown Color: Wound Bed Granulation Amount: Medium (34-66%) Granulation Quality: Red Fascia E Necrotic Amount: Medium (34-66%) Fat Laye Tendon E Muscle E Joint Ex Bone Exp r After Cleansing:  No ibrino Yes Exposed Structure xposed: No r (Subcutaneous Tissue) Exposed: Yes xposed: No xposed: No posed: No osed: No ion in Area: 77.7% ion in Volume: 10.7% alization: None ng: No ning: No Treatment Notes Wound #9RR (Left, Anterior Lower Leg) 1. Cleanse With Soap and water 2. Periwound Care TCA Ointment 3. Primary Dressing Applied Other primary dressing (specifiy in notes) 4. Secondary Dressing ABD Pad Dry Gauze 6. Support Layer [employer]  4 layer compression wrap Notes [employer]) Signed: 11/18/2019 4:37:08 PM By: Mikeal Hawthorne EMT/HBOT Signed: 11/19/2019 5:42:11 PM By: [name] Entered By: Mikeal Hawthorne on 11/17/2019 15:10:22 -------------------------------------------------------------------------------- Vitals Details Patient Name: Date of Service: Reicks, Ellias 11/14/2019 8:00 AM Medical Record [id number] Patient Account Number: [id number] Date of Birth/Sex: Treating [name]: 1976-08-17 (43 y.o. [name]) [name] Primary Care Provider: Dorthy [name], [name] Other Clinician: Referring Provider: Treating Provider/Extender:[name] [name] in Treatment: 29 Vital Signs Time Taken: 08:11 Temperature (F): 98.5 Height (in): 74 Reference Range: 80 - 120 mg / dl Weight (lbs): 325 Body Mass Index (BMI): 41.7 Electronic Signature(s) Signed: 11/19/2019 5:42:11 PM By: [name] Entered By: [name] on [name] 08:11:44

## 2019-11-21 ENCOUNTER — Encounter (HOSPITAL_BASED_OUTPATIENT_CLINIC_OR_DEPARTMENT_OTHER): Payer: 59 | Admitting: Internal Medicine

## 2019-11-21 ENCOUNTER — Other Ambulatory Visit: Payer: Self-pay

## 2019-11-21 DIAGNOSIS — I87322 Chronic venous hypertension (idiopathic) with inflammation of left lower extremity: Secondary | ICD-10-CM | POA: Diagnosis not present

## 2019-11-21 NOTE — Progress Notes (Signed)
[name], [name] ([id number]) Visit Report for 11/21/2019 Arrival Information Details Patient Name: Date of Service: [name], [name] 11/21/2019 8:00 AM Medical Record [id number] Patient Account Number: [id number] Date of Birth/Sex: Treating RN: 01-18-1976 (44 y.o. [name] Primary Care Annell Canty: [name], [name] Other Clinician: Referring Johnjoseph Rolfe: Treating Binyomin Brann/Extender:[name], [name], [name] in Treatment: 30 Visit Information History Since Last Visit Added or deleted any medications: No Patient Arrived: Ambulatory Any new allergies or adverse reactions: No Arrival Time: 08:30 Had a fall or experienced change in No Accompanied By: self activities of daily living that may affect Transfer Assistance: None risk of falls: Patient Identification Verified: Yes Signs or symptoms of abuse/neglect since last No Secondary Verification Process Completed: Yes visito Patient Requires Transmission-Based No Hospitalized since last visit: No Precautions: Implantable device outside of the clinic excluding No Patient Has Alerts: No cellular tissue based products placed in the center since last visit: Has Dressing in Place as Prescribed: Yes Has Compression in Place as Prescribed: Yes Pain Present Now: Yes Electronic Signature(s) Signed: 11/21/2019 4:58:06 PM By: Baruch Gouty RN, BSN Entered By: Baruch Gouty on 11/21/2019 08:31:37 -------------------------------------------------------------------------------- Compression Therapy Details Patient Name: Date of Service: [name], [name] 11/21/2019 8:00 AM Medical Record [id number] Patient Account Number: [id number] Date of Birth/Sex: Treating RN: Nov 27, 1975 (44 y.o. [name] Primary Care Dequarius Jeffries: [name], [name] Other Clinician: Referring Bryanda Mikel: Treating Elasia Furnish/Extender:[name], [name], [name] in Treatment: 30 Compression Therapy Performed for Wound Wound #13  Left,Proximal,Anterior Lower Leg Assessment: Performed By: Clinician Deon Pilling, RN Compression Type: Four Layer Post Procedure Diagnosis Same as Pre-procedure Electronic Signature(s) Signed: 11/21/2019 5:39:40 PM By: Kela Millin Entered By: Kela Millin on 11/21/2019 09:07:00 -------------------------------------------------------------------------------- Compression Therapy Details Patient Name: Date of Service: [name], [name] 11/21/2019 8:00 AM Medical Record [id number] Patient Account Number: [id number] Date of Birth/Sex: Treating RN: August 24, 1976 (44 y.o. [name] Primary Care Dorien Bessent: [name], [name] Other Clinician: Referring Tauriel Scronce: Treating Rudie Rikard/Extender:[name], [name], [name] in Treatment: 30 Compression Therapy Performed for Wound Wound #16 Left,Medial Calcaneus Assessment: Performed By: Clinician Deon Pilling, RN Compression Type: Four Layer Post Procedure Diagnosis Same as Pre-procedure Electronic Signature(s) Signed: 11/21/2019 5:39:40 PM By: Kela Millin Entered By: Kela Millin on 11/21/2019 09:07:00 -------------------------------------------------------------------------------- Compression Therapy Details Patient Name: Date of Service: [name], [name] 11/21/2019 8:00 AM Medical Record [id number] Patient Account Number: [id number] Date of Birth/Sex: Treating RN: Jun 04, 1976 (44 y.o. [name] Primary Care Aprill Banko: [name], [name] Other Clinician: Referring Yaiden Yang: Treating Phil Corti/Extender:[name], [name], [name] in Treatment: 30 Compression Therapy Performed for Wound Wound #9RR Left,Anterior Lower Leg Assessment: Performed By: Clinician Deon Pilling, RN Compression Type: Four Layer Post Procedure Diagnosis Same as Pre-procedure Electronic Signature(s) Signed: 11/21/2019 5:39:40 PM By: Kela Millin Entered By: Kela Millin on 11/21/2019  09:07:00 -------------------------------------------------------------------------------- Encounter Discharge Information Details Patient Name: Date of Service: [name], [name] 11/21/2019 8:00 AM Medical Record [id number] Patient Account Number: [id number] Date of Birth/Sex: Treating RN: 26-Jul-1976 (44 y.o. [name] Primary Care Momen Ham: [name], [name] Other Clinician: Referring Levi Klaiber: Treating Kailei Cowens/Extender:[name], [name], [name] in Treatment: 30 Encounter Discharge Information Items Discharge Condition: Stable Ambulatory Status: Ambulatory Discharge Destination: Home Transportation: Private Auto Accompanied By: self Schedule Follow-up Appointment: Yes Clinical Summary of Care: Electronic Signature(s) Signed: 11/21/2019 5:44:09 PM By: Deon Pilling Entered By: Deon Pilling on 11/21/2019 09:34:48 -------------------------------------------------------------------------------- Lower Extremity Assessment Details Patient Name: Date of Service: [name], [name] 11/21/2019 8:00 AM Medical Record [id number] Patient Account Number: [id number] Date of Birth/Sex: Treating RN: Mar 18, 1976 (44 y.o. [name] Primary  Care Demiah Gullickson: [name], [name] Other Clinician: Referring Allayah Raineri: Treating Jazminn Pomales/Extender:[name], [name], [name] in Treatment: 30 Edema Assessment Assessed: [Left: No] [Right: No] Edema: [Left: Ye] [Right: s] Calf Left: Right: Point of Measurement: 35 cm From Medial Instep 44.7 cm cm Ankle Left: Right: Point of Measurement: 11.5 cm From Medial Instep 24.8 cm cm Vascular Assessment Pulses: Dorsalis Pedis Palpable: [Left:Yes] Electronic Signature(s) Signed: 11/21/2019 4:58:06 PM By: Baruch Gouty RN, BSN Entered By: Baruch Gouty on 11/21/2019 08:42:39 -------------------------------------------------------------------------------- Multi Wound Chart Details Patient Name: Date of Service: [name], [name]  11/21/2019 8:00 AM Medical Record [id number] Patient Account Number: [id number] Date of Birth/Sex: Treating RN: 1976-01-17 (44 y.o. M) Primary Care Judea Riches: [name], [name] Other Clinician: Referring Frayda Egley: Treating Cresencio Reesor/Extender:[name], [name], [name] in Treatment: 30 Vital Signs Height(in): 74 Pulse(bpm): 40 Weight(lbs): 325 Blood Pressure(mmHg): 145/85 Body Mass Index(BMI): 42 Temperature(F): 98.4 Respiratory 18 Rate(breaths/min): Photos: [13:No Photos] [14:No Photos] [15:No Photos] Wound Location: [13:Left Lower Leg - Anterior, Left Lower Leg - Lateral, Proximal] [14:Proximal] [15:Left Lower Leg - Lateral] Wounding Event: [13:Gradually Appeared] [14:Blister] [15:Blister] Primary Etiology: [13:Venous Leg Ulcer] [14:Venous Leg Ulcer] [15:Venous Leg Ulcer] Comorbid History: [13:Asthma, Sleep Apnea, DeepAsthma, Sleep Apnea, DeepAsthma, Sleep Apnea, Deep Vein Thrombosis, Phlebitis, Vein Thrombosis, Phlebitis, Vein Thrombosis, Phlebitis, Osteoarthritis, Neuropathy Osteoarthritis, Neuropathy Osteoarthritis,  Neuropathy] Date Acquired: [13:09/23/2019] [14:10/30/2019] [15:10/23/2019] Weeks of Treatment: [13:7] [14:3] [15:3] Wound Status: [13:Open] [14:Healed - Epithelialized] [15:Healed - Epithelialized] Wound Recurrence: [13:No] [14:No] [15:No] Measurements L x W x D 0.7x0.8x0.1 [14:0x0x0] [15:0x0x0] (cm) Area (cm) : [13:0.44] [14:0] [15:0] Volume (cm) : [13:0.044] [14:0] [15:0] % Reduction in Area: [13:78.00%] [14:100.00%] [15:100.00%] % Reduction in Volume: 78.00% [14:100.00%] [15:100.00%] Classification: [13:Full Thickness Without Exposed Support Structures Exposed Support Structures Exposed Support Structures] [14:Full Thickness Without] [15:Full Thickness Without] Exudate Amount: [13:Small] [14:None Present] [15:None Present] Exudate Type: [13:Serosanguineous] [14:N/A] [15:N/A] Exudate Color: [13:red, brown] [14:N/A] [15:N/A] Wound Margin: [13:Flat  and Intact] [14:Indistinct, nonvisible] [15:Flat and Intact] Granulation Amount: [13:Large (67-100%)] [14:None Present (0%)] [15:None Present (0%)] Granulation Quality: [13:Red] [14:N/A] [15:N/A] Necrotic Amount: [13:None Present (0%)] [14:None Present (0%)] [15:None Present (0%)] Exposed Structures: [13:Fat Layer (Subcutaneous Tissue) Exposed: Yes Fascia: No Tendon: No Muscle: No Joint: No Bone: No] [14:Fascia: No Fat Layer (Subcutaneous Tissue) Exposed: No Tendon: No Muscle: No Joint: No Bone: No] [15:Fascia: No Fat Layer (Subcutaneous Tissue)  Exposed: No Tendon: No Muscle: No Joint: No Bone: No] Epithelialization: [13:None] [14:Large (67-100%)] [15:Large (67-100%)] Procedures Performed: [13:Compression Therapy 16] [14:N/A 9RR] [15:N/A N/A] Photos: [13:No Photos] [14:No Photos] [15:N/A] Wound Location: [13:Left Calcaneus - Medial] [14:Left Lower Leg - Anterior] [15:N/A] Wounding Event: [13:Gradually Appeared] [14:Gradually Appeared] [15:N/A] Primary Etiology: [13:Lymphedema] [14:Venous Leg Ulcer] [15:N/A] Comorbid History: [13:Asthma, Sleep Apnea, DeepAsthma, Sleep Apnea, DeepN/A Vein Thrombosis, Phlebitis, Vein Thrombosis, Phlebitis, Osteoarthritis, Neuropathy Osteoarthritis, Neuropathy] Date Acquired: [13:11/07/2019] [14:03/31/2019] [15:N/A] Weeks of Treatment: [13:2] [14:30] [15:N/A] Wound Status: [13:Open] [14:Open] [15:N/A] Wound Recurrence: [13:No] [14:Yes] [15:N/A] Measurements L x W x D 0.4x0.7x0.1 [14:1.4x0.7x0.1] [15:N/A] (cm) Area (cm) : [13:0.22] [14:0.77] [15:N/A] Volume (cm) : [13:0.022] [14:0.077] [15:N/A] % Reduction in Area: [13:-12.20%] [14:94.20%] [15:N/A] % Reduction in Volume: -10.00% [14:94.20%] [15:N/A] Classification: [13:Full Thickness Without Exposed Support Structures Exposed Support Structures] [14:Full Thickness Without] [15:N/A] Exudate Amount: [13:Small] [14:Small] [15:N/A] Exudate Type: [13:Serosanguineous] [14:Serosanguineous] [15:N/A] Exudate Color:  [13:red, brown] [14:red, brown] [15:N/A] Wound Margin: [13:Flat and Intact] [14:Flat and Intact] [15:N/A] Granulation Amount: [13:Large (67-100%)] [14:Large (67-100%)] [15:N/A] Granulation Quality: [13:Red] [14:Red] [15:N/A] Necrotic Amount: [13:Small (1-33%)] [14:None Present (0%)] [15:N/A] Exposed Structures: [13:Fat Layer (Subcutaneous Fat Layer (  Subcutaneous N/A Tissue) Exposed: Yes Fascia: No Tendon: No Muscle: No Joint: No Bone: No] [14:Tissue) Exposed: Yes Fascia: No Tendon: No Muscle: No Joint: No Bone: No] Epithelialization: [13:Small (1-33%)] [14:Small (1-33%) Compression Therapy] [15:N/A N/A] Treatment Notes Electronic Signature(s) Signed: 11/21/2019 5:37:40 PM By: Linton Ham MD Entered By: Linton Ham on 11/21/2019 09:30:43 -------------------------------------------------------------------------------- Multi-Disciplinary Care Plan Details Patient Name: Date of Service: [name], [name] 11/21/2019 8:00 AM Medical Record [id number] Patient Account Number: [id number] Date of Birth/Sex: Treating RN: [birth year] (44 y.o. [name] Primary Care Karlynn Furrow: [name], [name] Other Clinician: Referring Margel Joens: Treating Lyonel Morejon/Extender:[name], [name], [name] in Treatment: 30 Active Inactive Venous Leg Ulcer Nursing Diagnoses: Actual venous Insuffiency (use after diagnosis is confirmed) Knowledge deficit related to disease process and management Goals: Patient will maintain optimal edema control Date Initiated: 11/14/2019 Target Resolution Date: 12/12/2019 Goal Status: Active Interventions: Assess peripheral edema status every visit. Compression as ordered Treatment Activities: Therapeutic compression applied : 11/14/2019 Notes: Wound/Skin Impairment Nursing Diagnoses: Knowledge deficit related to ulceration/compromised skin integrity Goals: Patient/caregiver will verbalize understanding of skin care regimen Date Initiated:  07/22/2019 Target Resolution Date: 12/05/2019 Goal Status: Active Ulcer/skin breakdown will have a volume reduction of 30% by week 4 Date Inactivated: 08/26/2019 Target10/23/2020 Resolution Date Initiated: 07/22/2019 Date: Goal Status: Met Ulcer/skin breakdown will have a volume reduction of 50% by week 8 Date Initiated: 08/26/2019 Date Inactivated: 09/30/2019 Target Resolution Date: 09/19/2019 Goal Status: Unmet Unmet Reason: comorbities Ulcer/skin breakdown will have a volume reduction of 80% by week 12 Date Initiated: 09/30/2019 Target Resolution Date: 12/05/2019 Goal Status: Active Interventions: Assess patient/caregiver ability to obtain necessary supplies Assess patient/caregiver ability to perform ulcer/skin care regimen upon admission and as needed Assess ulceration(s) every visit Notes: Electronic Signature(s) Signed: 11/21/2019 5:39:40 PM By: Kela Millin Entered By: Kela Millin on 11/21/2019 08:03:59 -------------------------------------------------------------------------------- Pain Assessment Details Patient Name: Date of Service: [name], [name] 11/21/2019 8:00 AM Medical Record [id number] Patient Account Number: [id number] Date of Birth/Sex: Treating RN: 1976-02-03 (44 y.o. [name] Primary Care Eiden Bagot: [name], [name] Other Clinician: Referring Aniyiah Zell: Treating Kaulin Chaves/Extender:[name], [name], [name] in Treatment: 30 Active Problems Location of Pain Severity and Description of Pain Patient Has Paino Yes Site Locations Pain Location: Pain in Ulcers With Dressing Change: Yes Duration of the Pain. Constant / Intermittento Constant Rate the pain. Current Pain Level: 7 Worst Pain Level: 9 Least Pain Level: 5 Character of Pain Describe the Pain: Throbbing Pain Management and Medication Current Pain Management: Medication: Yes Leg drop or elevation: Yes Rest: Yes Is the Current Pain Management Adequate: Adequate How  does your wound impact your activities of daily livingo Sleep: No Bathing: No Appetite: No Relationship With Others: No Bladder Continence: No Emotions: Yes Bowel Continence: No Work: No Toileting: No Drive: No Dressing: No Hobbies: Astronomer) Signed: 11/21/2019 4:58:06 PM By: Baruch Gouty RN, BSN Entered By: Baruch Gouty on 11/21/2019 08:42:02 -------------------------------------------------------------------------------- Patient/Caregiver Education Details Patient Name: Date of Service: [name], [name] 1/22/2021andnbsp8:00 AM Medical Record [id number] Patient Account Number: [id number] Date of Birth/Gender: 1975/11/13 (44 y.o. M) Treating RN: Kela Millin Primary Care Physician: [name], [name] Other Clinician: Referring Physician: Treating Physician/Extender:[name], [name], [name] in Treatment: 30 Education Assessment Education Provided To: Patient Education Topics Provided Wound/Skin Impairment: Methods: Explain/Verbal Responses: State content correctly Electronic Signature(s) Signed: 11/21/2019 5:39:40 PM By: Kela Millin Entered By: Kela Millin on 11/21/2019 08:04:14 -------------------------------------------------------------------------------- Wound Assessment Details Patient Name: Date of Service: [name], [name] 11/21/2019 8:00 AM Medical Record [id number] Patient Account Number: [id number]  Date of Birth/Sex: Treating RN: January 29, 1976 (44 y.o. [name] Primary Care Ceili Boshers: [name], [name] Other Clinician: Referring Airiel Oblinger: Treating Remy Dia/Extender:[name], [name], [name] in Treatment: 30 Wound Status Wound Number: 13 Primary Venous Leg Ulcer Etiology: Wound Location: Left Lower Leg - Anterior, Proximal Wound Open Wounding Event: Gradually Appeared Status: Status: Date Acquired: 09/23/2019 Comorbid Asthma, Sleep Apnea, Deep Vein Weeks Of Treatment: 7 History:  Thrombosis, Phlebitis, Osteoarthritis, Clustered Wound: No Neuropathy Photos Wound Measurements Length: (cm) 0.7 % Reduct Width: (cm) 0.8 % Reduct Depth: (cm) 0.1 Epitheli Area: (cm) 0.44 Tunneli Volume: (cm) 0.044 Undermi Wound Description Classification: Full Thickness Without Exposed Support Foul Odo Structures Slough/F Wound Flat and Intact Margin: Exudate Small Amount: Exudate Serosanguineous Type: Exudate red, brown Color: Wound Bed Granulation Amount: Large (67-100%) Granulation Quality: Red Fascia E Necrotic Amount: None Present (0%) Fat Laye Tendon E Muscle E Joint Ex Bone Exp r After Cleansing: No ibrino Yes Exposed Structure xposed: No r (Subcutaneous Tissue) Exposed: Yes xposed: No xposed: No posed: No osed: No ion in Area: 78% ion in Volume: 78% alization: None ng: No ning: No Treatment Notes Wound #13 (Left, Proximal, Anterior Lower Leg) 1. Cleanse With Wound Cleanser Soap and water 2. Periwound Care Moisturizing lotion 3. Primary Dressing Applied Hydrogel or K-Y Jelly Other primary dressing (specifiy in notes) 4. Secondary Dressing ABD Pad Dry Gauze 6. Support Layer Applied 4 layer compression wrap Notes cutimed sorbact swab as primary dressing. netting Electronic Signature(s) Signed: 11/21/2019 4:58:06 PM By: Baruch Gouty RN, BSN Signed: 11/21/2019 5:14:05 PM By: Mikeal Hawthorne EMT/HBOT Entered By: Mikeal Hawthorne on 11/21/2019 14:26:02 -------------------------------------------------------------------------------- Wound Assessment Details Patient Name: Date of Service: [name], [name] 11/21/2019 8:00 AM Medical Record [id number] Patient Account Number: [id number] Date of Birth/Sex: Treating RN: [birth year] (44 y.o. [name] Primary Care Caryl Manas: [name], [name] Other Clinician: Referring Karee Christopherson: Treating Laker Thompson/Extender:[name], [name], [name] in Treatment: 30 Wound Status Wound Number: 14  Primary Venous Leg Ulcer Etiology: Wound Location: Left Lower Leg - Lateral, Proximal Wound Healed - Epithelialized Wounding Event: Blister Status: Date Acquired: 10/30/2019 Comorbid Asthma, Sleep Apnea, Deep Vein Weeks Of Treatment: 3 History: Thrombosis, Phlebitis, Osteoarthritis, Clustered Wound: No Neuropathy Photos Wound Measurements Length: (cm) 0 % Reduct Width: (cm) 0 % Reduct Depth: (cm) 0 Epitheli Area: (cm) 0 Tunneli Volume: (cm) 0 Undermi Wound Description Full Thickness Without Exposed Support Foul Odo Classification: Structures Slough/F Wound Indistinct, nonvisible Margin: Exudate None Present Amount: Wound Bed Granulation Amount: None Present (0%) Necrotic Amount: None Present (0%) Fascia E Fat Laye Tendon E Muscle E Joint Ex Bone Exp r After Cleansing: No ibrino No Exposed Structure xposed: No r (Subcutaneous Tissue) Exposed: No xposed: No xposed: No posed: No osed: No ion in Area: 100% ion in Volume: 100% alization: Large (67-100%) ng: No ning: No Electronic Signature(s) Signed: 11/21/2019 4:58:06 PM By: Baruch Gouty RN, BSN Signed: 11/21/2019 5:14:05 PM By: Mikeal Hawthorne EMT/HBOT Entered By: Mikeal Hawthorne on 11/21/2019 14:29:05 -------------------------------------------------------------------------------- Wound Assessment Details Patient Name: Date of Service: [name], COFFIN 11/21/2019 8:00 AM Medical Record [id number] Patient Account Number: [id number] Date of Birth/Sex: Treating RN: [birth year] (44 y.o. [name] Primary Care Ayrabella Labombard: [name], [name] Other Clinician: Referring Jaymeson Mengel: Treating Ithzel Fedorchak/Extender:[name], [name], [name] in Treatment: 30 Wound Status Wound Number: 15 Primary Venous Leg Ulcer Etiology: Wound Location: Left Lower Leg - Lateral Wound Healed - Epithelialized Wounding Event: Blister Status: Date Acquired: 10/23/2019 Comorbid Asthma, Sleep Apnea, Deep Vein Weeks Of  Treatment: 3 History: Thrombosis, Phlebitis,  Osteoarthritis, Clustered Wound: No Neuropathy Photos Wound Measurements Length: (cm) 0 % Reduct Width: (cm) 0 % Reduct Depth: (cm) 0 Epitheli Area: (cm) 0 Tunneli Volume: (cm) 0 Undermi Wound Description Full Thickness Without Exposed Support Foul Odo Classification: Structures Slough/F Wound Wound Flat and Intact Margin: Exudate None Present Amount: Wound Bed Granulation Amount: None Present (0%) Necrotic Amount: None Present (0%) Fascia Fat Lay Tendon Muscle Joint E Bone Ex r After Cleansing: No ibrino No Exposed Structure Exposed: No er (Subcutaneous Tissue) Exposed: No Exposed: No Exposed: No xposed: No posed: No ion in Area: 100% ion in Volume: 100% alization: Large (67-100%) ng: No ning: No Electronic Signature(s) Signed: 11/21/2019 4:58:06 PM By: Baruch Gouty RN, BSN Signed: 11/21/2019 5:14:05 PM By: Mikeal Hawthorne EMT/HBOT Entered By: Mikeal Hawthorne on 11/21/2019 14:28:41 -------------------------------------------------------------------------------- Wound Assessment Details Patient Name: Date of Service: EPIFANIO, LABRADOR 11/21/2019 8:00 AM Medical Record [id number] Patient Account Number: [id number] Date of Birth/Sex: Treating RN: 1976/01/27 (44 y.o. [name] Primary Care Sartaj Hoskin: [name], [name] Other Clinician: Referring Lani Havlik: Treating Albin Duckett/Extender:[name], [name], [name] in Treatment: 30 Wound Status Wound Number: 16 Primary Lymphedema Etiology: Wound Location: Left Calcaneus - Medial Wound Open Wounding Event: Gradually Appeared Status: Date Acquired: 11/07/2019 Comorbid Asthma, Sleep Apnea, Deep Vein Weeks Of Treatment: 2 History: Thrombosis, Phlebitis, Osteoarthritis, Clustered Wound: No Neuropathy Photos Wound Measurements Length: (cm) 0.4 % Reduc Width: (cm) 0.7 % Reduc Depth: (cm) 0.1 Epithel Area: (cm) 0.22 Tunneli Volume: (cm) 0.022  Undermi Wound Description Full Thickness Without Exposed Support Foul Odo Classification: Structures Slough/F Wound Flat and Intact Margin: Exudate Small Amount: Exudate Serosanguineous Type: Exudate red, brown Color: Wound Bed Granulation Amount: Large (67-100%) Granulation Quality: Red Fascia E Necrotic Amount: Small (1-33%) Fat Laye Necrotic Quality: Adherent Slough Tendon E Muscle E Joint Ex Bone Exp r After Cleansing: No ibrino No Exposed Structure xposed: No r (Subcutaneous Tissue) Exposed: Yes xposed: No xposed: No posed: No osed: No tion in Area: -12.2% tion in Volume: -10% ialization: Small (1-33%) ng: No ning: No Treatment Notes Wound #16 (Left, Medial Calcaneus) 1. Cleanse With Wound Cleanser Soap and water 2. Periwound Care TCA Cream 3. Primary Dressing Applied Hydrogel or K-Y Jelly Other primary dressing (specifiy in notes) 4. Secondary Dressing Dry Gauze 6. Support Layer Applied 4 layer compression wrap Notes Cutimed Sorbact swab as primary dressing. netting. Electronic Signature(s) Signed: 11/21/2019 4:58:06 PM By: Baruch Gouty RN, BSN Signed: 11/21/2019 5:14:05 PM By: Mikeal Hawthorne EMT/HBOT Entered By: Mikeal Hawthorne on 11/21/2019 14:24:39 -------------------------------------------------------------------------------- Wound Assessment Details Patient Name: Date of Service: DEAGLAN, LILE 11/21/2019 8:00 AM Medical Record [id number] Patient Account Number: [id number] Date of Birth/Sex: Treating RN: 28-Aug-1976 (44 y.o. [name] Primary Care Hovanes Hymas: [name], [name] Other Clinician: Referring Macallan Ord: Treating Zayaan Kozak/Extender:[name], [name], [name] in Treatment: 30 Wound Status Wound Number: [id number] Primary Venous Leg Ulcer Etiology: Wound Location: Left Lower Leg - Anterior Wound Open Wounding Event: Gradually Appeared Status: Date Acquired: 03/31/2019 Comorbid Asthma, Sleep Apnea, Deep Vein  Thrombosis, Weeks Of Treatment: 30 History: Phlebitis, Osteoarthritis, Neuropathy Clustered Wound: No Photos Wound Measurements Length: (cm) 1.4 % Reduct Width: (cm) 0.7 % Reduct Depth: (cm) 0.1 Epitheli Area: (cm) 0.77 Tunneli Volume: (cm) 0.077 Undermi Wound Description Classification: Full Thickness Without Exposed Support Foul Odo Structures Slough/F Wound Flat and Intact Margin: Exudate Small Amount: Exudate Serosanguineous Type: Exudate red, brown Color: Wound Bed Granulation Amount: Large (67-100%) Granulation Quality: Red Fascia E Necrotic Amount: None Present (0%) Fat Laye Tendon E Muscle E  Joint Ex Bone Exp r After Cleansing: No ibrino Yes Exposed Structure xposed: No r (Subcutaneous Tissue) Exposed: Yes xposed: No xposed: No posed: No osed: No ion in Area: 94.2% ion in Volume: 94.2% alization: Small (1-33%) ng: No ning: No Treatment Notes Wound #9RR (Left, Anterior Lower Leg) 1. Cleanse With Wound Cleanser Soap and water 2. Periwound Care Moisturizing lotion 3. Primary Dressing Applied Hydrogel or K-Y Jelly Other primary dressing (specifiy in notes) 4. Secondary Dressing ABD Pad Dry Gauze 6. Support Layer Applied 4 layer compression wrap Notes cutimed sorbact swab as primary dressing. netting Electronic Signature(s) Signed: 11/21/2019 4:58:06 PM By: Baruch Gouty RN, BSN Signed: 11/21/2019 5:14:05 PM By: Mikeal Hawthorne EMT/HBOT Entered By: Mikeal Hawthorne on 11/21/2019 14:25:01 -------------------------------------------------------------------------------- Vitals Details Patient Name: Date of Service: Ewer, Ajahni 11/21/2019 8:00 AM Medical Record [id number] Patient Account Number: [id number] Date of Birth/Sex: Treating RN: [birth year] (44 y.o. [name] Primary Care Abigaile Rossie: [name], [name] Other Clinician: Referring Shaylynn Nulty: Treating Meaghann Choo/Extender:[name], [name], [name] in Treatment: 30 Vital  Signs Time Taken: 08:31 Temperature (F): 98.4 Height (in): 74 Pulse (bpm): 83 Source: Stated Respiratory Rate (breaths/min): 18 Weight (lbs): 325 Blood Pressure (mmHg): 145/85 Source: Stated Reference Range: 80 - 120 mg / dl Body Mass Index (BMI): 41.7 Electronic Signature(s) Signed: 11/21/2019 4:58:06 PM By: Baruch Gouty RN, BSN Entered By: Baruch Gouty on 11/21/2019 08:32:08

## 2019-11-21 NOTE — Progress Notes (Signed)
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 areas. The original inferior area and the new area superiorly. He has been using Hydrofera Blue ready but the wounds overall look dry. He has not managed to get a CT scan approved and hence has not [facility] 8[facility]27 most of the patient's wound areas on the anterior look epithelialized. There is still some eschar and vulnerability here. He has been wearing to press stockings which should give him 30/40 mmHg compression. He also has compression pumps that belonged to his father. I think it would be reasonable to try and get him external compression pumps. He was not using the compression pumps daily and these certainly need to be used daily. Finally he was  approved for his CT scan on the right with follow-up with Dr. Trula OreMarsden. We will asked Dr. Trula OreMarsden to look at his  left leg as well 9/3; the patient had 2 wound areas. Central tibial area of area is healed. Smaller wound superiorly still perhaps slightly open. I believe his CT scan on the right hip and pelvis areas on the 18th. That was ordered by Dr. Trula Ore. We have ordered him 30/40 stockings and are going to attempt to get him compression pumps 9/10-Patient returns at 1 week for the 2 small areas on his left anterior leg, these areas are healed. Patient is now going to going to the 30/40 stockings. His CT scan of his right leg hip and pelvis areas is scheduled 9/22 the patient was discharged from the clinic on 9/10. He tells me that he started developing pain on the left anterior tibial area and swelling on Thursday of last week. By Saturday the swelling was so prominent that he was scared to take his stocking off because he would be able to get it back on. There was increased pain. He was seen in the emergency department at [facility] on 9/1. 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 He was also felt to have cellulitis. There was apparently not any acute clot. He I he was put on a 3 time a day medication/antibiotic which I am assuming is clindamycin. He has 2 reopened areas both on the anterior tibia 9/29; he completed the clindamycin even though it caused diarrhea. The diarrhea is resolved. The cellulitis in the left leg that was prominent last week has resolved. 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 I looked in [place] link I do not see the actual CT scan report. He is being apparently scheduled  for an attempt at stenting retrograde and then if that does not work anterior grade by Dr. Trula Ore. We use silver alginate to the wound last week because of coexistent cellulitis 10/6; cellulitis on the left leg resolved. He is still waiting for insurance authorization for his pelvic vein stenting. Changed him to [facility] last week 10/13; cellulitis remains resolved. Wound is smaller. Using Hydrofera Blue under compression. In 2 days time he is going for his venous stenting hopefully by Dr. Trula Ore at [facility] 10/20; the patient went for his procedure last Thursday by Dr. Trula Ore although he does not remember in the postop period what he was told. He is not certain whether Dr. Trula Ore managed to get the stent then successfully. His wound is measuring smaller looks healthy on the left mid tibia. He is also having some discomfort behind his left knee that he had me look at which is the site where Dr. Trula Ore had venous access. I looked in care everywhere. I could not see a specific note from Dr. Trula Ore [[facility]] above the actual procedure. Presumably it is still in transfer therefore I could not really answer his question about whether the stent was placed successful 10/27; wound not quite as good as last week. Using Hydrofera Blue. He thinks it might of stuck to the wound and was adherent when they took it off today. His procedure with Dr. Trula Ore was not successful. He thinks that Dr. Trula Ore will try to go at this from the other direction at some point. He sees him in 2-1/2-week 11/3;  quite an improvement in wound surface area this week. We are using Hydrofera Blue. No need to change the dressing. Follows up with Dr. Trula Ore in about a week and a half 11/10; wound surface not as good this week at all. No changes in dimensions. We have been using Hydrofera Blue. As well he had tells [place] he had discomfort in his foot all week although he admits he was up on this more than usual. He comes in today  with 2 small punched out areas on the left lateral foot. He has severe venous hypertension. He sees Dr. Trula Ore again on 11/16 11/17; not much change in the area on the anterior tibia mid aspect. He also has 2 areas on the left lateral foot. We have been using Sorbact on the left anterior and Iodosorb ointment on the left foot. He is under 4-layer compression. The patient went to see Dr. Trula Ore I do not yet have access to this note and care everywhere. However according to the patient there is an option to try and address his venous occlusion I think via a transjugular approach. This is complicated by the fact that the IVC filter is there and would have to be traversed. According to the patient there is about a 33% chance of perforation [name] be even aortic perforation. However they would be prepared for this. He is thinking about this and discussing it with his wife Thanks to our case manager this week we are able to determine if for some reason the patient is not using his compression pumps. He also is not able to get stockings on the right leg which are 30/40 below-knee. I think we [name] need to order external compression garments 12/1; patient arrives with the area on the left anterior mid tibia expanded superiorly small rim of skin between the original area and the new area. He has painful small denuded areas on the left medial and left lateral heel. We have been using Iodoflex. His next appointment with the Dr. Trula Ore is January 4. after the holidays to discuss if he would like to pursue this endovascular option. Dr. Sherrin Daisy notes below from last visit Subjective Ricci Dirocco is a 44 y.o. 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 Unfortunately during the procedure a  stent was not able to be placed d/t narrowing at the IVC. Per the patient the procedure provided 2-3 days of symptomatic relief (decrease in L LE pressure and pain) but after that his symptoms returned with increased pressure, pain in the hips and pain in the legs when standing. The patient does report his L LE wound is stable especially when he is consistently attending his wound care clinic in [place]. The patient denies using compressive LE therapies unless done at his wound clinic due to not being able to reach his legs. He is interested in learning more and possibly pursing further interventional options for his L lower extremity via access through neck. *Objective Physical Exam: BP 171/86  Pulse 80  Temp 37 C (Temporal)  Wt (!) 147.4 kg (325 lb)  BMI 41.73 kg/m General: awake, alert and oriented x 3 Chest: Non labored breathing on room air CVS: Normal rate. Regular rhythm. ABD: soft, NT, ND Ext: bilateral leg edema w/ left leg in compressive dressing from  wound clinic. Evidence of skin discoloration on both feet bilaterally. Data Review: Labs: None to review Imaging:Reviewed report of 10/15 L LE venogram. I saw and evaluated the patient, participating in the key portions of the service. I reviewed the residentoos note. I agree with the residentoos findings and plan. Electronically signed by Derry Skill, MD at 09/16/2019 10:53 AM EST 12/11 the areas around his ankles have healed. 2 areas in the center part of the mid tibia area are still open we have been using silver collagen. Procedure attempt by Dr. Trula Ore at [facility] on 11/03/2019 12/18; the wounds around his ankles remain closed. I think there has been some improvement in the 2 areas in the center part of the mid tibia. Especially superiorly. Debris on the center required debridement we have been using silver collagen 12/31; I saw the patient briefly last week when he was here for a nurse visit. He had  irritation in which look believed to be localized cellulitis and folliculitis on the lateral left calf. I gave him a prescription for cephalexin although he admits today he never filled it. The area still looked very angry. His original wounds we have been treating where a figure-of-eight shaped area on the anterior tibial area mid aspect. We have been using silver collagen these do not look healthy. 11/07/2019. The patient has completed his antibiotics. The area of folliculitis on the left lateral calf looks somewhat better although there is still erythema there is no tenderness. I am going to put some more compression on this area and will have a look at this next week. No additional antibiotics. Since the patient was last here he went to see Dr. [facility]ula [facility] at [facility]. I am able to see his note from 11/03/2019 in care everywhere. The patient has a history of a right iliac vein stent and IVC filter placement. He is status post angioplasty of the left common iliac vein on 08/14/2019 for chronic obstruction and venous insufficiency. Unfortunately a stent could not be placed during the procedure due to narrowing at the IVC from fibrotic change of the right iliac vein stent. As I understand things they are now going to approach this from both sides i.e. through an area in the internal jugular vein as well as the more standard distal approach and see if this area can be stented. The thought was 5 to 6 [name] before this could be arranged as it will "require 2 surgeons, insurance approval etc. The original wound on his anterior tibia looks better. He has the area on the left lateral tibia with localized swelling. He has a new area on the left medial calcaneus which is small punched out and very painful. I think this looks similar to wounds he has had in this area before. He has severe venous hypertension 1/15; anterior tibia wound has come down from a figure-of-eight to 1 wound. Has some depth which looks clean.  The area on the left lateral calf looks better. We put additional compression in here and the edema in this area looks better. His major complaint is on the medial calcaneus. He had mentioned this last week and he has had wounds like this before. Pain is severe 1/22; anterior tibial wound seems to be closing at with shallower and better looking wound margin. He has an area anteriorly that I think was initially an area of folliculitis the area on the left lateral calf has closed over The small area on the medial heel that I cultured last week  grew methicillin sensitive staph aureus but resistant to the doxycycline I gave him. I substituted cephalexin 500 every 6 and has only been on this for 2 days. Still says the pain is very significant Objective Constitutional Patient is hypertensive.. Pulse regular and within target range for patient.Marland Kitchen Respirations regular, non-labored and within target range.. Temperature is normal and within the target range for the patient.Marland Kitchen Appears in no distress. Vitals Time Taken: 8:31 AM, Height: 74 in, Source: Stated, Weight: 325 lbs, Source: Stated, BMI: 41.7, Temperature: 98.4 F, Pulse: 83 bpm, Respiratory Rate: 18 breaths/min, Blood Pressure: 145/85 mmHg. Cardiovascular We have very good edema control. General Notes: Wound exam ooArea in the mid tibia actually looks better. More shallow looking wound and with a better looking wound surface. ooLeft lateral calf is healed ooSuperiorly and anteriorly he has an open area that I think started as a an area of folliculitis. ooThe left medial calcaneus is really the patient's major complaint. There is no observable erythema which is an improvement. He has only been on the correct antibiotic for the MSSA that was cultured for 2 days however. Still markedly tender around the wound Integumentary (Hair, Skin) Wound #13 status is Open. Original cause of wound was Gradually Appeared. The wound is located on  the Left,Proximal,Anterior Lower Leg. The wound measures 0.7cm length x 0.8cm width x 0.1cm depth; 0.44cm^2 area and 0.044cm^3 volume. There is Fat Layer (Subcutaneous Tissue) Exposed exposed. There is no tunneling or undermining noted. There is a small amount of serosanguineous drainage noted. The wound margin is flat and intact. There is large (67-100%) red granulation within the wound bed. There is no necrotic tissue within the wound bed. Wound #14 status is Healed - Epithelialized. Original cause of wound was Blister. The wound is located on the Left,Proximal,Lateral Lower Leg. The wound measures 0cm length x 0cm width x 0cm depth; 0cm^2 area and 0cm^3 volume. There is no tunneling or undermining noted. There is a none present amount of drainage noted. The wound margin is indistinct and nonvisible. There is no granulation within the wound bed. There is no necrotic tissue within the wound bed. Wound #15 status is Healed - Epithelialized. Original cause of wound was Blister. The wound is located on the Left,Lateral Lower Leg. The wound measures 0cm length x 0cm width x 0cm depth; 0cm^2 area and 0cm^3 volume. There is no tunneling or undermining noted. There is a none present amount of drainage noted. The wound margin is flat and intact. There is no granulation within the wound bed. There is no necrotic tissue within the wound bed. Wound #16 status is Open. Original cause of wound was Gradually Appeared. The wound is located on the Left,Medial Calcaneus. The wound measures 0.4cm length x 0.7cm width x 0.1cm depth; 0.22cm^2 area and 0.022cm^3 volume. There is Fat Layer (Subcutaneous Tissue) Exposed exposed. There is no tunneling or undermining noted. There is a small amount of serosanguineous drainage noted. The wound margin is flat and intact. There is large (67-100%) red granulation within the wound bed. There is a small (1-33%) amount of necrotic tissue within the wound bed including Adherent  Slough. Wound #9RR status is Open. Original cause of wound was Gradually Appeared. The wound is located on the Left,Anterior Lower Leg. The wound measures 1.4cm length x 0.7cm width x 0.1cm depth; 0.77cm^2 area and 0.077cm^3 volume. There is Fat Layer (Subcutaneous Tissue) Exposed exposed. There is no tunneling or undermining noted. There is a small amount of serosanguineous drainage noted. The  wound margin is flat and intact. There is large (67-100%) red granulation within the wound bed. There is no necrotic tissue within the wound bed. Assessment Active Problems ICD-10 Non-pressure chronic ulcer of other part of left lower leg limited to breakdown of skin 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 layer exposed Chronic venous hypertension (idiopathic) with inflammation of left lower extremity Non-pressure chronic ulcer of other part of left foot with other specified severity Lymphedema, not elsewhere classified Other thrombophilia Procedures Wound #13 Pre-procedure diagnosis of Wound #13 is a Venous Leg Ulcer located on the Left,Proximal,Anterior Lower Leg . There was a Four Layer Compression Therapy Procedure by Shawn Stalleaton, Bobbi, RN. Post procedure Diagnosis Wound #13: Same as Pre-Procedure Wound #16 Pre-procedure diagnosis of Wound #16 is a Lymphedema located on the Left,Medial Calcaneus . There was a Four Layer Compression Therapy Procedure by Shawn Stalleaton, Bobbi, RN. Post procedure Diagnosis Wound #16: Same as Pre-Procedure Wound #9RR Pre-procedure diagnosis of Wound #9RR is a Venous Leg Ulcer located on the Left,Anterior Lower Leg . There was a Four Layer Compression Therapy Procedure by Shawn Stalleaton, Bobbi, RN. Post procedure Diagnosis Wound #9RR: Same as Pre-Procedure Plan Follow-up Appointments: Return Appointment in 1 week. - Friday Dressing Change Frequency: Do not change entire dressing for one week. Skin Barriers/Peri-Wound Care: TCA Cream or Ointment - to left  leg. Wound Cleansing: [name] shower with protection. Primary Wound Dressing: Wound #13 Left,Proximal,Anterior Lower Leg: Other: - cutimed sorbact swab with hydrogel. Wound #14 Left,Proximal,Lateral Lower Leg: Other: - cutimed sorbact swab with hydrogel. Wound #15 Left,Lateral Lower Leg: Other: - cutimed sorbact swab with hydrogel. Wound #16 Left,Medial Calcaneus: Other: - cutimed sorbact swab with hydrogel. Wound #9RR Left,Anterior Lower Leg: Other: - cutimed sorbact swab with hydrogel. Secondary Dressing: Dry Gauze ABD pad - extra ABD pad to lateral leg for increased compression Edema Control: 4 layer compression: Left lower extremity - Add extra folded ABD pad to lateral leg for additional compression Avoid standing for long periods of time Elevate legs to the level of the heart or above for 30 minutes daily and/or when sitting, a frequency of: Support Garment 30-40 mm/Hg pressure to: - right leg : apply compression stockings dual layer in the am and remove at night. Segmental Compressive Device. - lymphedema pumps 1 hour 2 times per day. Additional Orders / Instructions: Other: - start antibiotic right away and complete the entire course. 1. I am continuing with Sorbact and the wounds under 4-layer compression 2. The area on the right heel did not look worse although it was still tender. The degree of erythema was not noticeable but still tender in the same area towards the distal foot. After he has completed the antibiotics if there is still that degree of tenderness [name] need to consider an x-ray. 3 he [facility] Electronic Signature(s) Signed: 11/21/2019 5:37:40 PM By: Baltazar Najjarobson, Jaira Canady MD Entered By: Baltazar Najjarobson, Amor Packard on 11/21/2019 09:37:27 -------------------------------------------------------------------------------- SuperBill Details Patient Name: Date of Service: [name], [name] 11/21/2019 Medical Record [id number] Patient Account Number:  [id number] Date of Birth/Sex: Treating RN: [name] (44 y.o. [name] RightM) Dwiggins, Shannon Primary Care Provider: Docia ChuckKoirala, [name] Other Clinician: Referring Provider: Treating Provider/Extender:Doni Widmer, [name] ShyMichael Koirala, [name] in Treatment: 30 Diagnosis Coding ICD-10 Codes Code Description [phone] Non-pressure chronic ulcer of other part of left lower leg limited to breakdown of skin L97.422 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 layer exposed I87.322 Chronic venous hypertension (idiopathic) with inflammation of left lower extremity L97.528 Non-pressure  chronic ulcer of other part of left foot with other specified severity I89.0 Lymphedema, not elsewhere classified D68.69 Other thrombophilia Facility Procedures CPT4 Code Description: [id number] (Facility Use Only) [phone] - APPLY MULTLAY COMPRS LWR LT LEG Modifier: Quantity: 1 Physician Procedures Electronic Signature(s) Signed: 11/21/2019 5:37:40 PM By: Baltazar Najjar MD Entered By: Baltazar Najjar on 11/21/2019 09:37:50

## 2019-11-28 ENCOUNTER — Other Ambulatory Visit: Payer: Self-pay

## 2019-11-28 ENCOUNTER — Encounter (HOSPITAL_BASED_OUTPATIENT_CLINIC_OR_DEPARTMENT_OTHER): Payer: 59 | Attending: Internal Medicine | Admitting: Internal Medicine

## 2019-11-28 DIAGNOSIS — I87322 Chronic venous hypertension (idiopathic) with inflammation of left lower extremity: Secondary | ICD-10-CM | POA: Diagnosis not present

## 2019-11-28 NOTE — Progress Notes (Addendum)
[name] ([id number]) Visit Report for 11/28/2019 Arrival Information Details Patient Name: Date of Service: [name], [name] 11/28/2019 8:15 AM Medical Record [id number] Patient Account Number: [id number] Date of Birth/Sex: Treating RN: 04-17-1976 (44 y.o. [name] Primary Care Dalayla Aldredge: [name], [name] Other Clinician: Referring Shundra Wirsing: Treating Malinda Mayden/Extender:[name], [name], [name] in Treatment: 75 Visit Information History Since Last Visit Added or deleted any medications: No Patient Arrived: Ambulatory Any new allergies or adverse reactions: No Arrival Time: 08:21 Had a fall or experienced change in No Accompanied By: self activities of daily living that may affect Transfer Assistance: None risk of falls: Patient Identification Verified: Yes Signs or symptoms of abuse/neglect since last No Secondary Verification Process Completed: Yes visito Patient Requires Transmission-Based No Hospitalized since last visit: No Precautions: Implantable device outside of the clinic excluding No Patient Has Alerts: No cellular tissue based products placed in the center since last visit: Has Dressing in Place as Prescribed: Yes Has Compression in Place as Prescribed: Yes Pain Present Now: Yes Electronic Signature(s) Signed: 11/28/2019 8:33:08 AM By: Baruch Gouty RN, BSN Entered By: Baruch Gouty on 11/28/2019 08:21:31 -------------------------------------------------------------------------------- Compression Therapy Details Patient Name: Date of Service: [name] 11/28/2019 8:15 AM Medical Record [id number] Patient Account Number: [id number] Date of Birth/Sex: Treating RN: [birth year] (44 y.o. [name] Primary Care Holiday Mcmenamin: [name], [name] Other Clinician: Referring Rolland Steinert: Treating Shalea Tomczak/Extender:[name], [name], [name] in Treatment: 31 Compression Therapy Performed for Wound Wound #13  Left,Proximal,Anterior Lower Leg Assessment: Performed By: Clinician Deon Pilling, RN Compression Type: Four Layer Post Procedure Diagnosis Same as Pre-procedure Electronic Signature(s) Signed: 11/28/2019 5:49:40 PM By: Kela Millin Entered By: Kela Millin on 11/28/2019 08:56:16 -------------------------------------------------------------------------------- Compression Therapy Details Patient Name: Date of Service: [name] 11/28/2019 8:15 AM Medical Record [id number] Patient Account Number: [id number] Date of Birth/Sex: Treating RN: [birth year] (44 y.o. [name] Primary Care Rivka Baune: [name], [name] Other Clinician: Referring Haya Hemler: Treating Jsaon Yoo/Extender:[name], [name], [name] in Treatment: 31 Compression Therapy Performed for Wound Wound #16 Left,Medial Calcaneus Assessment: Performed By: Clinician Deon Pilling, RN Compression Type: Four Layer Post Procedure Diagnosis Same as Pre-procedure Electronic Signature(s) Signed: 11/28/2019 5:49:40 PM By: Kela Millin Entered By: Kela Millin on 11/28/2019 08:56:16 -------------------------------------------------------------------------------- Compression Therapy Details Patient Name: Date of Service: [name] 11/28/2019 8:15 AM Medical Record [id number] Patient Account Number: [id number] Date of Birth/Sex: Treating RN: 04/13/1976 (44 y.o. [name] Primary Care Cyan Moultrie: [name], [name] Other Clinician: Referring Jaydalee Bardwell: Treating Rossi Silvestro/Extender:[name], [name], [name] in Treatment: 31 Compression Therapy Performed for Wound Wound #9RR Left,Anterior Lower Leg Assessment: Performed By: Clinician Deon Pilling, RN Compression Type: Four Layer Post Procedure Diagnosis Same as Pre-procedure Electronic Signature(s) Signed: 11/28/2019 5:49:40 PM By: Kela Millin Entered By: Kela Millin on 11/28/2019  08:56:16 -------------------------------------------------------------------------------- Encounter Discharge Information Details Patient Name: Date of Service: [name] 11/28/2019 8:15 AM Medical Record [id number] Patient Account Number: [id number] Date of Birth/Sex: Treating RN: [birth year] (44 y.o. [name] Primary Care Rorey Bisson: [name], [name] Other Clinician: Referring Cove Haydon: Treating Fraya Ueda/Extender:[name], [name], [name] in Treatment: 31 Encounter Discharge Information Items Discharge Condition: Stable Ambulatory Status: Ambulatory Discharge Destination: Home Transportation: Private Auto Accompanied By: self Schedule Follow-up Appointment: Yes Clinical Summary of Care: Electronic Signature(s) Signed: 11/28/2019 6:00:37 PM By: Deon Pilling Entered By: Deon Pilling on 11/28/2019 09:14:11 -------------------------------------------------------------------------------- Lower Extremity Assessment Details Patient Name: Date of Service: [name] 11/28/2019 8:15 AM Medical Record [id number] Patient Account Number: [id number] Date of Birth/Sex: Treating RN: [birth year] (44 y.o. [name] Primary  Care Constance Whittle: [name], [name] Other Clinician: Referring Ashleymarie Granderson: Treating Patricio Popwell/Extender:[name], [name], [name] in Treatment: 31 Edema Assessment Assessed: [Left: No] [Right: No] Edema: [Left: Ye] [Right: s] Calf Left: Right: Point of Measurement: 35 cm From Medial Instep 42.5 cm cm Ankle Left: Right: Point of Measurement: 11.5 cm From Medial Instep 23.5 cm cm Vascular Assessment Pulses: Dorsalis Pedis Palpable: [Left:Yes] Electronic Signature(s) Signed: 11/28/2019 8:33:08 AM By: Baruch Gouty RN, BSN Entered By: Baruch Gouty on 11/28/2019 08:23:51 -------------------------------------------------------------------------------- Multi Wound Chart Details Patient Name: Date of Service: [name]  11/28/2019 8:15 AM Medical Record [id number] Patient Account Number: [id number] Date of Birth/Sex: Treating RN: [birth year] (44 y.o. M) Primary Care Mavis Gravelle: [name], [name] Other Clinician: Referring Trayton Szabo: Treating Krissia Schreier/Extender:[name], [name], [name] in Treatment: 31 Vital Signs Height(in): 74 Pulse(bpm): 85 Weight(lbs): 325 Blood Pressure(mmHg): 138/89 Body Mass Index(BMI): 42 Temperature(F): 98.7 Respiratory 18 Rate(breaths/min): Photos: [13:No Photos] [16:No Photos No Photos] Wound Location: [13:Left Lower Leg - Anterior, Left Lower Leg - Anterior, Left Calcaneus - Medial Proximal] [16:Proximal] Wounding Event: [13:Gradually Appeared] [16:Gradually Appeared Gradually Appeared] Primary Etiology: [13:Venous Leg Ulcer] [16:Venous Leg Ulcer Lymphedema] Comorbid History: [13:Asthma, Sleep Apnea, DeepAsthma, Sleep Apnea, DeepAsthma, Sleep Apnea, Deep Vein Thrombosis, Phlebitis, Vein Thrombosis, Phlebitis, Vein Thrombosis, Phlebitis, Osteoarthritis, Neuropathy Osteoarthritis, Neuropathy Osteoarthritis,  Neuropathy] Date Acquired: [13:09/23/2019] [16:09/23/2019 11/07/2019] Weeks of Treatment: [13:8] [16:8 3] Wound Status: [13:Open] [16:Open Open] Wound Recurrence: [13:No] [16:No No] Measurements L x W x D 0.3x0.4x0.2 [16:0.3x0.4x0.2 0.4x0.4x0.1] (cm) Area (cm) : [13:0.094] [16:0.094 0.126] Volume (cm) : [13:0.019] [16:0.019 0.013] % Reduction in Area: [13:95.30%] [16:95.30% 35.70%] % Reduction in Volume: 90.50% [16:90.50% 35.00%] Classification: [13:Full Thickness Without Exposed Support Structures Exposed Support Structures Exposed Support Structures] [16:Full Thickness Without Full Thickness Without] Exudate Amount: [13:Small] [16:Small Small] Exudate Type: [13:Serosanguineous] [16:Serosanguineous Serosanguineous] Exudate Color: [13:red, brown] [16:red, brown red, brown] Wound Margin: [13:Flat and Intact] [16:Flat and Intact Flat and  Intact] Granulation Amount: [13:Large (67-100%)] [16:Large (67-100%) None Present (0%)] Granulation Quality: [13:Red] [16:Red N/A] Necrotic Amount: [13:None Present (0%)] [16:None Present (0%) Large (67-100%)] Exposed Structures: [13:Fat Layer (Subcutaneous Tissue) Exposed: Yes Fascia: No Tendon: No Muscle: No Joint: No Bone: No] [16:Fat Layer (Subcutaneous Fat Layer (Subcutaneous Tissue) Exposed: Yes Tissue) Exposed: Yes Fascia: No Fascia: No Tendon: No Tendon: No Muscle: No  Muscle: No Joint: No Joint: No Bone: No Bone: No] Epithelialization: [13:Small (1-33%)] [16:Small (1-33%) Medium (34-66%)] Procedures Performed: [13:Compression Therapy 9RR] [16:Compression Therapy Compression Therapy N/A N/A] Photos: [13:No Photos] [16:N/A N/A] Wound Location: [13:Left Lower Leg - Anterior] [16:N/A N/A] Wounding Event: [13:Gradually Appeared] [16:N/A N/A] Primary Etiology: [13:Venous Leg Ulcer] [16:N/A N/A] Comorbid History: [13:Asthma, Sleep Apnea, DeepN/A Vein Thrombosis, Phlebitis, Osteoarthritis, Neuropathy] [16:N/A] Date Acquired: [13:03/31/2019] [16:N/A N/A] Weeks of Treatment: [13:31] [16:N/A N/A] Wound Status: [13:Open] [16:N/A N/A] Wound Recurrence: [13:Yes] [16:N/A N/A] Measurements L x W x D 1.4x0.6x0.1 [16:N/A N/A] (cm) Area (cm) : [13:0.66] [16:N/A N/A] Volume (cm) : [13:0.066] [16:N/A N/A] % Reduction in Area: [13:95.00%] [16:N/A N/A] % Reduction in Volume: 95.00% [16:N/A N/A] Classification: [13:Full Thickness Without Exposed Support Structures] [16:N/A N/A] Exudate Amount: [13:Medium] [16:N/A N/A] Exudate Type: [13:Serosanguineous] [16:N/A N/A] Exudate Color: [13:red, brown] [16:N/A N/A] Wound Margin: [13:Flat and Intact] [16:N/A N/A] Granulation Amount: [13:Medium (34-66%)] [16:N/A N/A] Granulation Quality: [13:Red] [16:N/A N/A] Necrotic Amount: [13:Medium (34-66%)] [16:N/A N/A] Exposed Structures: [13:Fat Layer (Subcutaneous N/A Tissue) Exposed: Yes Fascia: No Tendon: No  Muscle: No Joint: No Bone: No] [16:N/A] Epithelialization: [13:Small (1-33%)] [16:N/A N/A N/A N/A] Treatment Notes Electronic Signature(s) Signed: 11/28/2019  5:55:42 PM By: Linton Ham MD Entered By: Linton Ham on 11/28/2019 09:13:29 -------------------------------------------------------------------------------- Multi-Disciplinary Care Plan Details Patient Name: Date of Service: KAIZEN, IBSEN 11/28/2019 8:15 AM Medical Record [id number] Patient Account Number: [id number] Date of Birth/Sex: Treating RN: 05-24-1976 (44 y.o. [name] Primary Care Eldar Robitaille: [name], [name] Other Clinician: Referring Arlin Savona: Treating Denim Kalmbach/Extender:[name], [name], [name] in Treatment: 31 Active Inactive Venous Leg Ulcer Nursing Diagnoses: Actual venous Insuffiency (use after diagnosis is confirmed) Knowledge deficit related to disease process and management Goals: Patient will maintain optimal edema control Date Initiated: 11/14/2019 Target Resolution Date: 12/12/2019 Goal Status: Active Interventions: Assess peripheral edema status every visit. Compression as ordered Treatment Activities: Therapeutic compression applied : 11/14/2019 Notes: Wound/Skin Impairment Nursing Diagnoses: Knowledge deficit related to ulceration/compromised skin integrity Goals: Patient/caregiver will verbalize understanding of skin care regimen Date Initiated: 07/22/2019 Target Resolution Date: 12/05/2019 Goal Status: Active Ulcer/skin breakdown will have a volume reduction of 30% by week 4 Date Inactivated: 08/26/2019 Target10/23/2020 Resolution Date Initiated: 07/22/2019 Date: Goal Status: Met Ulcer/skin breakdown will have a volume reduction of 50% by week 8 Date Initiated: 08/26/2019 Date Inactivated: 09/30/2019 Target Resolution Date: 09/19/2019 Goal Status: Unmet Unmet Reason: comorbities Ulcer/skin breakdown will have a volume reduction of 80% by week 12 Date  Initiated: 09/30/2019 Target Resolution Date: 12/05/2019 Goal Status: Active Interventions: Assess patient/caregiver ability to obtain necessary supplies Assess patient/caregiver ability to perform ulcer/skin care regimen upon admission and as needed Assess ulceration(s) every visit Notes: Electronic Signature(s) Signed: 11/28/2019 5:49:40 PM By: Kela Millin Entered By: Kela Millin on 11/28/2019 08:38:56 -------------------------------------------------------------------------------- Pain Assessment Details Patient Name: Date of Service: HADLEY, DETLOFF 11/28/2019 8:15 AM Medical Record [id number] Patient Account Number: [id number] Date of Birth/Sex: Treating RN: 02-21-1976 (44 y.o. [name] Primary Care Roc Streett: [name], [name] Other Clinician: Referring Erick Oxendine: Treating Karrie Fluellen/Extender:[name], [name], [name] in Treatment: 31 Active Problems Location of Pain Severity and Description of Pain Patient Has Paino Yes Site Locations Pain Location: Pain in Ulcers With Dressing Change: Yes Rate the pain. Current Pain Level: 2 Worst Pain Level: 5 Least Pain Level: 0 Character of Pain Describe the Pain: Throbbing Pain Management and Medication Current Pain Management: Medication: Yes Is the Current Pain Management Adequate: Adequate Rest: Yes How does your wound impact your activities of daily livingo Sleep: No Bathing: No Appetite: No Relationship With Others: No Bladder Continence: No Emotions: No Bowel Continence: No Work: No Toileting: No Drive: No Dressing: No Hobbies: No Electronic Signature(s) Signed: 11/28/2019 8:33:08 AM By: Baruch Gouty RN, BSN Entered By: Baruch Gouty on 11/28/2019 08:23:20 -------------------------------------------------------------------------------- Patient/Caregiver Education Details Patient Name: Date of Service: [name] 1/29/2021andnbsp8:15 AM Medical Record [id number]  Patient Account Number: [id number] Date of Birth/Gender: 1976/06/14 (44 y.o. M) Treating RN: Kela Millin Primary Care Physician: [name], [name] Other Clinician: Referring Physician: Treating Physician/Extender:[name], [name], [name] in Treatment: 58 Education Assessment Education Provided To: Patient Education Topics Provided Wound/Skin Impairment: Methods: Explain/Verbal Responses: State content correctly Electronic Signature(s) Signed: 11/28/2019 5:49:40 PM By: Kela Millin Entered By: Kela Millin on 11/28/2019 08:39:09 -------------------------------------------------------------------------------- Wound Assessment Details Patient Name: Date of Service: JAHID, WEIDA 11/28/2019 8:15 AM Medical Record [id number] Patient Account Number: [id number] Date of Birth/Sex: Treating RN: 1976-08-22 (44 y.o. M) Primary Care Maycol Hoying: [name], [name] Other Clinician: Referring Vimal Derego: Treating Ercell Perlman/Extender:[name], [name], [name] in Treatment: 31 Wound Status Wound Number: [id number] Primary Venous Leg Ulcer Etiology: Wound Location: Left, Proximal, Anterior Lower Leg Wound Open Wounding Event: Gradually Appeared Status: Status: Date Acquired: 09/23/2019 Comorbid  Asthma, Sleep Apnea, Deep Vein Weeks Of Treatment: 8 History: Thrombosis, Phlebitis, Osteoarthritis, Clustered Wound: No Neuropathy Wound Measurements Length: (cm) 0.3 % Re Width: (cm) 0.4 % Re Depth: (cm) 0.2 Epit Area: (cm) 0.094 Tun Volume: (cm) 0.019 Und Wound Description Classification: Full Thickness Without Exposed Support Structures Wound Flat and Intact Margin: Exudate Small Amount: Exudate Serosanguineous Type: Exudate red, brown Color: Wound Bed Granulation Amount: Large (67-100%) Granulation Quality: Red Necrotic Amount: None Present (0%) Foul Odor After Cleansing: No Slough/Fibrino Yes Exposed Structure Fascia Exposed: No Fat Layer  (Subcutaneous Tissue) Exposed: Yes Tendon Exposed: No Muscle Exposed: No Joint Exposed: No Bone Exposed: No duction in Area: 95.3% duction in Volume: 90.5% helialization: Small (1-33%) neling: No ermining: No Electronic Signature(s) Signed: 12/09/2019 4:28:10 PM By: Mikeal Hawthorne EMT/HBOT Previous Signature: 11/28/2019 8:33:08 AM Version By: Baruch Gouty RN, BSN Entered By: Mikeal Hawthorne on 12/09/2019 14:19:51 -------------------------------------------------------------------------------- Wound Assessment Details Patient Name: Date of Service: ASER, NYLUND 11/28/2019 8:15 AM Medical Record [id number] Patient Account Number: [id number] Date of Birth/Sex: Treating RN: January 11, 1976 (44 y.o. M) Primary Care Marcellene Shivley: [name], [name] Other Clinician: Referring Arvel Oquinn: Treating Wilmetta Speiser/Extender:[name], [name], [name] in Treatment: 31 Wound Status Wound Number: 13 Primary Venous Leg Ulcer Etiology: Wound Location: Left Lower Leg - Anterior, Proximal Wound Open Wounding Event: Gradually Appeared Status: Date Acquired: 09/23/2019 Comorbid Asthma, Sleep Apnea, Deep Vein Comorbid Asthma, Sleep Apnea, Deep Vein Weeks Of Treatment: 8 History: Thrombosis, Phlebitis, Osteoarthritis, Clustered Wound: No Neuropathy Photos Wound Measurements Length: (cm) 0.3 % Reduct Width: (cm) 0.4 % Reduct Depth: (cm) 0.2 Epitheli Area: (cm) 0.094 Tunneli Volume: (cm) 0.019 Undermi Wound Description Classification: Full Thickness Without Exposed Support Foul Odo Structures Slough/F Wound Flat and Intact Margin: Exudate Small Amount: Exudate Serosanguineous Type: Exudate red, brown Color: Wound Bed Granulation Amount: Large (67-100%) Granulation Quality: Red Fascia E Necrotic Amount: None Present (0%) Fat Laye Tendon E Muscle E Joint Ex Bone Exp r After Cleansing: No ibrino Yes Exposed Structure xposed: No r (Subcutaneous Tissue) Exposed: Yes xposed:  No xposed: No posed: No osed: No ion in Area: 95.3% ion in Volume: 90.5% alization: Small (1-33%) ng: No ning: No Treatment Notes Wound #13 (Left, Proximal, Anterior Lower Leg) 1. Cleanse With Wound Cleanser Soap and water 2. Periwound Care TCA Cream 3. Primary Dressing Applied Hydrogel or K-Y Jelly Other primary dressing (specifiy in notes) 4. Secondary Dressing ABD Pad Dry Gauze 6. Support Layer Applied 4 layer compression wrap Notes primary dressing cutimed sorbact swab. netting. Electronic Signature(s) Signed: 12/12/2019 4:39:06 PM By: Mikeal Hawthorne EMT/HBOT Previous Signature: 12/10/2019 4:31:42 PM Version By: Mikeal Hawthorne EMT/HBOT Previous Signature: 12/09/2019 4:28:10 PM Version By: Mikeal Hawthorne EMT/HBOT Previous Signature: 11/28/2019 8:33:08 AM Version By: Baruch Gouty RN, BSN Entered By: Mikeal Hawthorne on 12/12/2019 15:49:30 -------------------------------------------------------------------------------- Wound Assessment Details Patient Name: Date of Service: KINGSLEE, MAIRENA 11/28/2019 8:15 AM Medical Record [id number] Patient Account Number: [id number] Date of Birth/Sex: Treating RN: 05-19-1976 (44 y.o. M) Primary Care Omni Dunsworth: [name], [name] Other Clinician: Referring Raylynn Hersh: Treating Mikel Pyon/Extender:[name], [name], [name] in Treatment: 31 Wound Status Wound Number: 16 Primary Lymphedema Etiology: Wound Location: Left, Medial Calcaneus Wound Open Wounding Event: Gradually Appeared Status: Date Acquired: 11/07/2019 Comorbid Asthma, Sleep Apnea, Deep Vein Weeks Of Treatment: 3 History: Thrombosis, Phlebitis, Osteoarthritis, Clustered Wound: No Neuropathy Photos Wound Measurements Length: (cm) 0.4 % Reduct Width: (cm) 0.4 % Reduct Depth: (cm) 0.1 Epitheli Area: (cm) 0.126 Tunneli Volume: (cm) 0.013 Undermi Wound Description Full Thickness Without Exposed Support Foul Odo Classification:  Structures Slough/F Wound Flat  and Intact Margin: Exudate Small Amount: Exudate Serosanguineous Serosanguineous Type: Exudate red, brown Color: Wound Bed Granulation Amount: None Present (0%) Necrotic Amount: Large (67-100%) Fascia Exp Necrotic Quality: Adherent Slough Fat Layer Tendon Exp Muscle Exp Joint Expo Bone Expos r After Cleansing: No ibrino No Exposed Structure osed: No (Subcutaneous Tissue) Exposed: Yes osed: No osed: No sed: No ed: No ion in Area: 35.7% ion in Volume: 35% alization: Medium (34-66%) ng: No ning: No Electronic Signature(s) Signed: 12/10/2019 4:31:42 PM By: Mikeal Hawthorne EMT/HBOT Previous Signature: 12/09/2019 4:28:10 PM Version By: Mikeal Hawthorne EMT/HBOT Previous Signature: 11/28/2019 8:33:08 AM Version By: Baruch Gouty RN, BSN Entered By: Mikeal Hawthorne on 12/10/2019 15:51:21 -------------------------------------------------------------------------------- Wound Assessment Details Patient Name: Date of Service: JAVEON, MACMURRAY 11/28/2019 8:15 AM Medical Record [id number] Patient Account Number: [id number] Date of Birth/Sex: Treating RN: [birth year] (44 y.o. M) Primary Care Mariluz Crespo: [name], [name] Other Clinician: Referring Kymani Shimabukuro: Treating Hasel Janish/Extender:[name], [name], [name] in Treatment: 31 Wound Status Wound Number: [id number] Primary Venous Leg Ulcer Etiology: Wound Location: Left, Anterior Lower Leg Wound Open Wounding Event: Gradually Appeared Status: Date Acquired: 03/31/2019 Comorbid Asthma, Sleep Apnea, Deep Vein Thrombosis, Weeks Of Treatment: 31 History: Phlebitis, Osteoarthritis, Neuropathy Clustered Wound: No Photos Wound Measurements Length: (cm) 1.4 % Reductio Width: (cm) 0.6 % Reductio Depth: (cm) 0.1 Epithelial Area: (cm) 0.66 Tunneling Volume: (cm) 0.066 Undermi Wound Description Classification: Full Thickness Without Exposed Support Foul Odo Structures Slough/F Wound Flat and Intact Margin: Exudate  Medium Amount: Exudate Serosanguineous Type: Exudate red, brown Color: Wound Bed Granulation Amount: Medium (34-66%) Granulation Quality: Red Fascia E Necrotic Amount: Medium (34-66%) Fat Laye Necrotic Quality: Adherent Slough Tendon E Muscle E Joint Ex Bone Exp r After Cleansing: No ibrino Yes Exposed Structure xposed: No r (Subcutaneous Tissue) Exposed: Yes xposed: No xposed: No posed: No osed: No n in Area: 95% n in Volume: 95% ization: Small (1-33%) : No ning: No Electronic Signature(s) Signed: 12/09/2019 4:28:10 PM By: Mikeal Hawthorne EMT/HBOT Previous Signature: 11/28/2019 8:33:08 AM Version By: Baruch Gouty RN, BSN Entered By: Mikeal Hawthorne on 12/09/2019 14:28:15 -------------------------------------------------------------------------------- Vitals Details Patient Name: Date of Service: JAELEN, SOTH 11/28/2019 8:15 AM Medical Record [id number] Patient Account Number: [id number] Date of Birth/Sex: Treating RN: 1976-06-16 (44 y.o. [name] Primary Care Willliam Pettet: [name], [name] Other Clinician: Referring Markel [name]: Treating Johnnie Goynes/Extender:[name], [name], [name] in Treatment: 31 Vital Signs Time Taken: 08:21 Temperature (F): 98.7 Height (in): 74 Pulse (bpm): 79 Source: Stated Respiratory Rate (breaths/min): 18 Weight (lbs): 325 Blood Pressure (mmHg): 138/89 Source: Stated Reference Range: 80 - 120 mg / dl Body Mass Index (BMI): 41.7 Electronic Signature(s) Signed: 11/28/2019 8:33:08 AM By: Baruch Gouty RN, BSN Entered By: Baruch Gouty on 11/28/2019 08:22:15

## 2019-11-28 NOTE — Progress Notes (Signed)
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 patient's wound areas on the anterior look epithelialized. There is still some eschar and vulnerability here. He has been wearing to press stockings which should give him 30/40 mmHg compression. He also has compression pumps that belonged to his father. I think it would be reasonable to try and get him external compression pumps. He was not using the  compression pumps daily and these certainly need to be used daily. Finally he was approved for his CT scan on the right with follow-up with Dr. Trula Ore.  We will asked Dr. Gladys Damme to look at his left leg as well 9/3; the patient had 2 wound areas. Central tibial area of area is healed. Smaller wound superiorly still perhaps slightly open. I believe his CT scan on the right hip and pelvis areas on the 18th. That was ordered by Dr. Gladys Damme. We have ordered him 30/40 stockings and are going to attempt to get him compression pumps 9/10-Patient returns at 1 week for the 2 small areas on his left anterior leg, these areas are healed. Patient is now going to going to the 30/40 stockings. His CT scan of his right leg hip and pelvis areas is scheduled 9/22 the patient was discharged from the clinic on 9/10. He tells me that he started developing pain on the left anterior tibial area and swelling on Thursday of last week. By Saturday the swelling was so prominent that he was scared to take his stocking off because he would be able to get it back on. There was increased pain. He was seen in the emergency department at [facility] on 9/1. 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 He was also felt to have cellulitis. There was apparently not any acute clot. He I he was put on a 3 time a day medication/antibiotic which I am assuming is clindamycin. He has 2 reopened areas both on the anterior tibia 9/29; he completed the clindamycin even though it caused diarrhea. The diarrhea is resolved. The cellulitis in the left leg that was prominent last week has resolved. 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 I looked in [facility] link I do not see the actual CT scan report. He is being apparently scheduled for an attempt at stenting retrograde and then if that does not work anterior grade by Dr. Gladys Damme. We use silver alginate to the wound last week because of coexistent cellulitis 10/6; cellulitis on the left leg resolved. He is still waiting for insurance authorization for his pelvic vein stenting. Changed him to [facility] last week 10/13; cellulitis remains resolved. Wound is smaller. Using Hydrofera Blue under compression. In 2 days time he is going for his venous stenting hopefully by Dr. Gladys Damme at [facility] 10/20; the patient went for his procedure last Thursday by Dr. Gladys Damme although he does not remember in the postop period what he was told. He is not certain whether Dr. Gladys Damme managed to get the stent then successfully. His wound is measuring smaller looks healthy on the left mid tibia. He is also having some discomfort behind his left knee that he had me look at which is the site where Dr. Gladys Damme had venous access. I looked in care everywhere. I could not see a specific note from Dr. Gladys Damme [[facility]] above the actual procedure. Presumably it is still in transfer therefore I could not really answer his question about whether the stent was placed successful 10/27; wound not quite as good as last week. Using Hydrofera Blue. He thinks it might of stuck to the wound and was adherent when they took it off today. His procedure with Dr. Gladys Damme was not successful. He thinks that Dr. Gladys Damme will try to go at this from the other direction  at some point. He sees him in 2-1/2-week 11/3; quite an improvement in wound surface area this week. We are using Hydrofera Blue. No need to change the dressing. Follows up with Dr. Trula Ore in about a week and a half 11/10; wound surface not as good this week at all. No changes in dimensions. We have been using Hydrofera Blue. As well he had tells [place] he had discomfort in his foot  all week although he admits he was up on this more than usual. He comes in today with 2 small punched out areas on the left lateral foot. He has severe venous hypertension. He sees Dr. Trula Ore again on 11/16 11/17; not much change in the area on the anterior tibia mid aspect. He also has 2 areas on the left lateral foot. We have been using Sorbact on the left anterior and Iodosorb ointment on the left foot. He is under 4-layer compression. The patient went to see Dr. Trula Ore I do not yet have access to this note and care everywhere. However according to the patient there is an option to try and address his venous occlusion I think via a transjugular approach. This is complicated by the fact that the IVC filter is there and would have to be traversed. According to the patient there is about a 33% chance of perforation may be even aortic perforation. However they would be prepared for this. He is thinking about this and discussing it with his wife Thanks to our case manager this week we are able to determine if for some reason the patient is not using his compression pumps. He also is not able to get stockings on the right leg which are 30/40 below-knee. I think we may need to order external compression garments 12/1; patient arrives with the area on the left anterior mid tibia expanded superiorly small rim of skin between the original area and the new area. He has painful small denuded areas on the left medial and left lateral heel. We have been using Iodoflex. His next appointment with the Dr. Trula Ore is January 4. after the holidays to discuss if he would like to pursue this endovascular option. Dr. Sherrin Daisy notes below from last visit Subjective Tyvion Edmondson is a 44 y.o. 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 Unfortunately during the procedure a stent was not able to be placed d/t narrowing at the IVC. Per the patient the procedure provided 2-3 days of symptomatic relief (decrease in L LE pressure and pain) but after that his symptoms returned with increased pressure, pain in the hips and pain in the legs when standing. The patient does report his L LE wound is stable especially when he is consistently attending his wound care clinic in [place]. The patient denies using compressive LE therapies unless done at his wound clinic due to not being able to reach his legs. He is interested in learning more and possibly pursing further interventional options for his L lower extremity via access through neck. *Objective Physical Exam: BP 171/86  Pulse 80  Temp 37 C (Temporal)  Wt (!) 147.4 kg (325 lb)  BMI 41.73 kg/m General: awake, alert and oriented x 3 Chest: Non labored breathing on room air CVS: Normal rate. Regular rhythm. ABD: soft, NT, ND Ext: bilateral  leg edema w/ left leg in compressive dressing from wound clinic. Evidence of skin discoloration on both feet bilaterally. Data Review: Labs: None to review Imaging:Reviewed report of 10/15 L LE venogram. I saw and evaluated the patient, participating in the key portions of the service. I reviewed the residentoos note. I agree with the residentoos findings and plan. Electronically signed by Derry Skill, MD at 09/16/2019 10:53 AM EST 12/11 the areas around his ankles have healed. 2 areas in the center part of the mid tibia area are still open we have been using silver collagen. Procedure attempt by Dr. Trula Ore at [facility] on 11/03/2019 12/18; the wounds around his ankles remain closed. I think there has been some improvement in the 2 areas in the center part of the mid tibia. Especially superiorly. Debris on the center required debridement we have been using silver collagen 12/31; I  saw the patient briefly last week when he was here for a nurse visit. He had irritation in which look believed to be localized cellulitis and folliculitis on the lateral left calf. I gave him a prescription for cephalexin although he admits today he never filled it. The area still looked very angry. His original wounds we have been treating where a figure-of-eight shaped area on the anterior tibial area mid aspect. We have been using silver collagen these do not look healthy. 11/07/2019. The patient has completed his antibiotics. The area of folliculitis on the left lateral calf looks somewhat better although there is still erythema there is no tenderness. I am going to put some more compression on this area and will have a look at this next week. No additional antibiotics. Since the patient was last here he went to see Dr. Trula Ore at [facility]. I am able to see his note from 11/03/2019 in care everywhere. The patient has a history of a right iliac vein stent and IVC filter placement. He is status post angioplasty of the left common iliac vein on 08/14/2019 for chronic obstruction and venous insufficiency. Unfortunately a stent could not be placed during the procedure due to narrowing at the IVC from fibrotic change of the right iliac vein stent. As I understand things they are now going to approach this from both sides i.e. through an area in the internal jugular vein as well as the more standard distal approach and see if this area can be stented. The thought was 5 to 6 weeks before this could be arranged as it will "require 2 surgeons, insurance approval etc. The original wound on his anterior tibia looks better. He has the area on the left lateral tibia with localized swelling. He has a new area on the left medial calcaneus which is small punched out and very painful. I think this looks similar to wounds he has had in this area before. He has severe venous hypertension 1/15; anterior tibia wound has come  down from a figure-of-eight to 1 wound. Has some depth which looks clean. The area on the left lateral calf looks better. We put additional compression in here and the edema in this area looks better. His major complaint is on the medial calcaneus. He had mentioned this last week and he has had wounds like this before. Pain is severe 1/22; anterior tibial wound seems to be closing at with shallower and better looking wound margin. He has an area anteriorly that I think was initially an area of folliculitis the area on the left lateral calf has closed over The small area  on the medial heel that I cultured last week grew methicillin sensitive staph aureus but resistant to the doxycycline I gave him. I substituted cephalexin 500 every 6 and has only been on this for 2 days. Still says the pain is very significant 1/29; all the patient's wounds look quite good today including the anterior tibial which is closing in. The area above this also closing in finally the very painful area on the medial heel. He is completing antibiotics today this cultured methicillin sensitive staph aureus. Back to using silver alginate on the wounds Objective Constitutional Sitting or standing Blood Pressure is within target range for patient.. Pulse regular and within target range for patient.Marland Kitchen Respirations regular, non-labored and within target range.. Temperature is normal and within the target range for the patient.Marland Kitchen Appears in no distress. Vitals Time Taken: 8:21 AM, Height: 74 in, Source: Stated, Weight: 325 lbs, Source: Stated, BMI: 41.7, Temperature: 98.7 F, Pulse: 79 bpm, Respiratory Rate: 18 breaths/min, Blood Pressure: 138/89 mmHg. Respiratory work of breathing is normal. Cardiovascular Pedal pulses are palpable. He has good edema control in the left leg. The amount of stasis dermatitis/inflammation is also a lot better.. General Notes: Wound exam ooThe area in the mid tibia looks shallow healthy  surface. ooLeft lateral calf is healed superiorly I think this is improved. I used Anasept and gauze to debride this. ooThe small painful area on the heel is much better. Surface eschar I did not remove there is no surrounding tenderness Integumentary (Hair, Skin) Wound #13 status is Open. Original cause of wound was Gradually Appeared. The wound is located on the Left,Proximal,Anterior Lower Leg. The wound measures 0.3cm length x 0.4cm width x 0.2cm depth; 0.094cm^2 area and 0.019cm^3 volume. There is Fat Layer (Subcutaneous Tissue) Exposed exposed. There is no tunneling or undermining noted. There is a small amount of serosanguineous drainage noted. The wound margin is flat and intact. There is large (67-100%) red granulation within the wound bed. There is no necrotic tissue within the wound bed. Wound #13 status is Open. Original cause of wound was Gradually Appeared. The wound is located on the Left,Proximal,Anterior Lower Leg. The wound measures 0.3cm length x 0.4cm width x 0.2cm depth; 0.094cm^2 area and 0.019cm^3 volume. There is Fat Layer (Subcutaneous Tissue) Exposed exposed. There is no tunneling or undermining noted. There is a small amount of serosanguineous drainage noted. The wound margin is flat and intact. There is large (67-100%) red granulation within the wound bed. There is no necrotic tissue within the wound bed. Wound #16 status is Open. Original cause of wound was Gradually Appeared. The wound is located on the Left,Medial Calcaneus. The wound measures 0.4cm length x 0.4cm width x 0.1cm depth; 0.126cm^2 area and 0.013cm^3 volume. There is Fat Layer (Subcutaneous Tissue) Exposed exposed. There is no tunneling or undermining noted. There is a small amount of serosanguineous drainage noted. The wound margin is flat and intact. There is no granulation within the wound bed. There is a large (67-100%) amount of necrotic tissue within the wound bed including Adherent  Slough. Wound #9RR status is Open. Original cause of wound was Gradually Appeared. The wound is located on the Left,Anterior Lower Leg. The wound measures 1.4cm length x 0.6cm width x 0.1cm depth; 0.66cm^2 area and 0.066cm^3 volume. There is Fat Layer (Subcutaneous Tissue) Exposed exposed. There is no tunneling or undermining noted. There is a medium amount of serosanguineous drainage noted. The wound margin is flat and intact. There is medium (34-66%) red granulation within the  wound bed. There is a medium (34-66%) amount of necrotic tissue within the wound bed including Adherent Slough. Assessment Active Problems ICD-10 Non-pressure chronic ulcer of other part of left lower leg limited to breakdown of skin 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 layer exposed Chronic venous hypertension (idiopathic) with inflammation of left lower extremity Non-pressure chronic ulcer of other part of left foot with other specified severity Lymphedema, not elsewhere classified Other thrombophilia Procedures Wound #13 Pre-procedure diagnosis of Wound #13 is a Venous Leg Ulcer located on the Left,Proximal,Anterior Lower Leg . There was a Four Layer Compression Therapy Procedure by Shawn Stall, RN. Post procedure Diagnosis Wound #13: Same as Pre-Procedure Wound #16 Pre-procedure diagnosis of Wound #16 is a Lymphedema located on the Left,Medial Calcaneus . There was a Four Layer Compression Therapy Procedure by Shawn Stall, RN. Post procedure Diagnosis Wound #16: Same as Pre-Procedure Wound #9RR Pre-procedure diagnosis of Wound #9RR is a Venous Leg Ulcer located on the Left,Anterior Lower Leg . There was a Four Layer Compression Therapy Procedure by Shawn Stall, RN. Post procedure Diagnosis Wound #9RR: Same as Pre-Procedure Plan Follow-up Appointments: Return Appointment in 1 week. - Friday Dressing Change Frequency: Do not change entire dressing for one week. Skin  Barriers/Peri-Wound Care: TCA Cream or Ointment - to left leg. Wound Cleansing: May shower with protection. Primary Wound Dressing: Wound #13 Left,Proximal,Anterior Lower Leg: Other: - cutimed sorbact swab with hydrogel. Wound #16 Left,Medial Calcaneus: Other: - cutimed sorbact swab with hydrogel. Wound #9RR Left,Anterior Lower Leg: Other: - cutimed sorbact swab with hydrogel. Secondary Dressing: Dry Gauze ABD pad - extra ABD pad to lateral leg for increased compression Edema Control: 4 layer compression: Left lower extremity - Add extra folded ABD pad to lateral leg for additional compression Avoid standing for long periods of time Elevate legs to the level of the heart or above for 30 minutes daily and/or when sitting, a frequency of: Support Garment 30-40 mm/Hg pressure to: - right leg : apply compression stockings dual layer in the am and remove at night. Segmental Compressive Device. - lymphedema pumps 1 hour 2 times per day. 1 we are continuing with Sorbact of the wounds areas. 2. Still with the same degree of compression 3. Still awaiting insurance approval at the attempt to stent the stricture and is pelvic pain 4. The cellulitis in the left heel complicated by MSSA is resolving. He completes his antibiotics today. Electronic Signature(s) Signed: 11/28/2019 5:55:42 PM By: Baltazar Najjar MD Entered By: Baltazar Najjar on 11/28/2019 09:17:54 -------------------------------------------------------------------------------- SuperBill Details Patient Name: Date of Service: LAFE, CLERK 11/28/2019 Medical Record [id number] Patient Account Number: [id number] Date of Birth/Sex: Treating RN: 1975-12-19 (44 y.o. [name] Primary Care Provider: [name], [name] Other Clinician: Referring Provider: Treating Provider/Extender:Julius Matus, [name], [name] in Treatment: 31 Diagnosis Coding ICD-10 Codes Code Description L97.821 Non-pressure chronic ulcer of  other part of left lower leg limited to breakdown of skin L97.422 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 layer exposed I87.322 Chronic venous hypertension (idiopathic) with inflammation of left lower extremity L97.528 Non-pressure chronic ulcer of other part of left foot with other specified severity I89.0 Lymphedema, not elsewhere classified D68.69 Other thrombophilia Facility Procedures CPT4 Code Description: [id number] (Facility Use Only) [id number] - APPLY MULTLAY COMPRS LWR LT LEG Modifier: Quantity: 1 Physician Procedures CPT4 Code Description: [id number] - WC PHYS LEVEL 3 - EST PT ICD-10 Diagnosis Description L97.821 Non-pressure chronic ulcer of other part of left lower leg  l skin L97.422 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 Modifier: imited to breakdow layer exposed Quantity: 1 n of Electronic Signature(s) Signed: 11/28/2019 5:55:42 PM By: Baltazar Najjar MD Entered By: Baltazar Najjar on 11/28/2019 09:17:29

## 2019-12-05 ENCOUNTER — Other Ambulatory Visit: Payer: Self-pay

## 2019-12-05 ENCOUNTER — Encounter (HOSPITAL_BASED_OUTPATIENT_CLINIC_OR_DEPARTMENT_OTHER): Payer: 59 | Attending: Internal Medicine

## 2019-12-05 ENCOUNTER — Ambulatory Visit (INDEPENDENT_AMBULATORY_CARE_PROVIDER_SITE_OTHER): Payer: 59 | Admitting: Physician Assistant

## 2019-12-05 ENCOUNTER — Encounter: Payer: Self-pay | Admitting: Physician Assistant

## 2019-12-05 DIAGNOSIS — F4310 Post-traumatic stress disorder, unspecified: Secondary | ICD-10-CM | POA: Diagnosis not present

## 2019-12-05 DIAGNOSIS — L97422 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 layer exposed: Secondary | ICD-10-CM | POA: Insufficient documentation

## 2019-12-05 DIAGNOSIS — Z86718 Personal history of other venous thrombosis and embolism: Secondary | ICD-10-CM | POA: Diagnosis not present

## 2019-12-05 DIAGNOSIS — D6869 Other thrombophilia: Secondary | ICD-10-CM | POA: Diagnosis not present

## 2019-12-05 DIAGNOSIS — L97521 Non-pressure chronic ulcer of other part of left foot limited to breakdown of skin: Secondary | ICD-10-CM | POA: Insufficient documentation

## 2019-12-05 DIAGNOSIS — I890 Lymphedema, not elsewhere classified: Secondary | ICD-10-CM | POA: Insufficient documentation

## 2019-12-05 DIAGNOSIS — F331 Major depressive disorder, recurrent, moderate: Secondary | ICD-10-CM

## 2019-12-05 DIAGNOSIS — F5105 Insomnia due to other mental disorder: Secondary | ICD-10-CM

## 2019-12-05 DIAGNOSIS — G629 Polyneuropathy, unspecified: Secondary | ICD-10-CM | POA: Diagnosis not present

## 2019-12-05 DIAGNOSIS — M1990 Unspecified osteoarthritis, unspecified site: Secondary | ICD-10-CM | POA: Diagnosis not present

## 2019-12-05 DIAGNOSIS — L97528 Non-pressure chronic ulcer of other part of left foot with other specified severity: Secondary | ICD-10-CM | POA: Insufficient documentation

## 2019-12-05 DIAGNOSIS — I809 Phlebitis and thrombophlebitis of unspecified site: Secondary | ICD-10-CM | POA: Diagnosis not present

## 2019-12-05 DIAGNOSIS — J45909 Unspecified asthma, uncomplicated: Secondary | ICD-10-CM | POA: Insufficient documentation

## 2019-12-05 DIAGNOSIS — L97821 Non-pressure chronic ulcer of other part of left lower leg limited to breakdown of skin: Secondary | ICD-10-CM | POA: Diagnosis not present

## 2019-12-05 DIAGNOSIS — F99 Mental disorder, not otherwise specified: Secondary | ICD-10-CM

## 2019-12-05 DIAGNOSIS — G4730 Sleep apnea, unspecified: Secondary | ICD-10-CM | POA: Diagnosis not present

## 2019-12-05 MED ORDER — BUPROPION HCL ER (XL) 150 MG PO TB24: 450.0000 mg | ORAL_TABLET | Freq: Every day | ORAL | 1 refills | Status: AC

## 2019-12-05 NOTE — Progress Notes (Signed)
[facility]  Patient ID: [name],  MRN: [id number]  PCP: Lujean Amel, MD  Date of Evaluation: 12/05/2019 Time spent:20 minutes  Chief Complaint:  Chief Complaint    Follow-up; Depression     Virtual Visit via Video Note  I connected with [name] on 12/05/19 at  2:30 PM EST by a video enabled telemedicine application and verified that I am speaking with the correct person using two identifiers.  Location: Patient: Home Provider: Office   I discussed the limitations of evaluation and management by telemedicine and the availability of in person appointments. The patient expressed understanding and agreed to proceed.    I provided 20 minutes of non-face-to-face time during this encounter.   Donnal Moat, PA-C  HISTORY/CURRENT STATUS: HPI For routine med check.  At the last visit, we increased the Wellbutrin and weaned him off of the Zoloft.  That has been a good change and he states increasing the Wellbutrin was very helpful at first.  However, he feels that it is not working as well as it did.  He feels kind of hopeless at times.  Part of it is related to a new health problem.  See review of systems below.  He does not have a lot of energy or motivation.  Part of that is because he is not physically able to do things.  He denies suicidal or homicidal thoughts.  Sometimes he sleeps well and sometimes he does not.  He prefers not to use any medication for that right now.  In the past, he was taking tizanidine for muscle spasms and sleep.  But he did not like the hangover feeling and almost like "sleep paralysis" feel that he had from it.  He mostly has trouble falling asleep and he can usually stay asleep.  Patient denies increased energy with decreased need for sleep, no increased talkativeness, no racing thoughts, no impulsivity or risky behaviors, no increased spending, no increased libido, no grandiosity.  Anxiety is not a huge problem.  Of course he can  get a little anxious when triggered by something.  Sexual side effects related to the Zoloft have improved.  Denies dizziness, syncope, seizures, numbness, tingling, tremor, tics, unsteady gait, slurred speech, confusion.  He has chronic muscle pain and stiffness.  No dystonia  Individual Medical History/ Review of Systems: Changes? :Yes  has a vein in his hip that's blocked.  He will have a second procedure to try to open that up.  In the meantime, he is in quite a bit of pain.  His chronic pain medications are ineffective for this type pain.  Past medications for mental health diagnoses include: Cymbalta, Zoloft, Wellbutrin, Tizanadine (for muscle relaxer and sleep)   Allergies: Patient has no known allergies.  Current Medications:  Current Outpatient Medications:  .  gabapentin (NEURONTIN) 800 MG tablet, Take 800-1,600 mg by mouth 3 (three) times daily. Take 800mg  in the morning, 800mg  in the afternoon, and 1600mg  at bedtime, Disp: , Rfl:  .  methadone (DOLOPHINE) 10 MG tablet, Take 10 mg by mouth 3 (three) times daily. , Disp: , Rfl:  .  NUCYNTA 50 MG tablet, Take 50 mg by mouth 3 (three) times daily. , Disp: , Rfl:  .  rivaroxaban (XARELTO) 20 MG TABS tablet, Take 20 mg by mouth at bedtime. , Disp: , Rfl:  .  buPROPion (WELLBUTRIN XL) 150 MG 24 hr tablet, Take 3 tablets (450 mg total) by mouth daily., Disp: 90 tablet, Rfl: 1 Medication Side  Effects: none  Family Medical/ Social History: Changes? No  MENTAL HEALTH EXAM:  There were no vitals taken for this visit.There is no height or weight on file to calculate BMI.  General Appearance: Casual, Neat and Well Groomed  Eye Contact:  Good  Speech:  Clear and Coherent  Volume:  Normal  Mood:  Euthymic  Affect:  Appropriate  Thought Process:  Goal Directed and Descriptions of Associations: Intact  Orientation:  Full (Time, Place, and Person)  Thought Content: Logical   Suicidal Thoughts:  No  Homicidal Thoughts:  No  Memory:  WNL   Judgement:  Good  Insight:  Good  Psychomotor Activity:  Normal  Concentration:  Concentration: Good  Recall:  Good  Fund of Knowledge: Good  Language: Good  Assets:  Desire for Improvement  ADL's:  Intact  Cognition: WNL  Prognosis:  Good    DIAGNOSES:    ICD-10-CM   1. Major depressive disorder, recurrent episode, moderate (HCC)  F33.1   2. PTSD (post-traumatic stress disorder)  F43.10   3. Insomnia due to other mental disorder  F51.05    F99     Receiving Psychotherapy: No    RECOMMENDATIONS:  I spent 20 minutes with him. PDMP was reviewed. Increase Wellbutrin XL 150 mg, to 3 p.o. every morning. We discussed sleep hygiene.  He prefers not to take another medication for sleep right now.  He would like to see how well he does with the change in Wellbutrin and address the insomnia at the next visit if it is still an issue. Recommend therapy. Return in 4 to 6 weeks.  Melony Overly, PA-C

## 2019-12-05 NOTE — Progress Notes (Signed)
DEVARIUS, NELLES ([id number]) Visit Report for 12/05/2019 SuperBill Details Patient Name: Date of Service: NOEMI, ISHMAEL 12/05/2019 Medical Record [id number] Patient Account Number: [id number] Date of Birth/Sex: Treating RN: 07-Oct-1976 (43 y.o. [name]) [name] Primary Care Provider: [name], [name] Other Clinician: Referring Provider: Treating Provider/Extender:Donevan Biller, [name], [name] in Treatment: 32 Diagnosis Coding ICD-10 Codes Code Description L97.821 Non-pressure chronic ulcer of other part of left lower leg limited to breakdown of skin L97.422 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 layer exposed I87.322 Chronic venous hypertension (idiopathic) with inflammation of left lower extremity L97.528 Non-pressure chronic ulcer of other part of left foot with other specified severity I89.0 Lymphedema, not elsewhere classified D68.69 Other thrombophilia Facility Procedures CPT4 Code Description Modifier Quantity [id number] (Facility Use Only) [phone] - APPLY MULTLAY COMPRS LWR LT LEG 1 Electronic Signature(s) Signed: 12/05/2019 5:25:54 PM By: [name] RN Signed: 12/05/2019 5:45:44 PM By: Baltazar Najjar MD Entered By: [name] on 12/05/2019 09:04:24

## 2019-12-05 NOTE — Progress Notes (Signed)
IRVIN, BASTIN ([id number]) Visit Report for 12/05/2019 Arrival Information Details Patient Name: Date of Service: KAYLER, RISE 12/05/2019 8:00 AM Medical Record [id number] Patient Account Number: [id number] Date of Birth/Sex: Treating RN: 08-16-1976 (44 y.o. [name]) [name] Primary Care Sianni Cloninger: [name], [name] Other Clinician: Referring Brylyn Novakovich: Treating Tam Savoia/Extender:[name], [name], [name] in Treatment: 32 Visit Information History Since Last Visit All ordered tests and consults were completed: No Patient Arrived: Ambulatory Added or deleted any medications: No Arrival Time: 08:36 Any new allergies or adverse reactions: No Accompanied By: self Had a fall or experienced change in No Transfer Assistance: None activities of daily living that may affect Patient Identification Verified: Yes risk of falls: Secondary Verification Process Yes Signs or symptoms of abuse/neglect since last No Completed: visito Patient Requires Transmission-Based No Hospitalized since last visit: No Precautions: Implantable device outside of the clinic excluding No Patient Has Alerts: No cellular tissue based products placed in the center since last visit: Has Dressing in Place as Prescribed: Yes Has Compression in Place as Prescribed: Yes Pain Present Now: No Electronic Signature(s) Signed: 12/05/2019 5:25:54 PM By: [name] RN Entered By: [name] on 12/05/2019 08:36:38 -------------------------------------------------------------------------------- Compression Therapy Details Patient Name: Date of Service: ZAYLEN, SUSMAN 12/05/2019 8:00 AM Medical Record [id number] Patient Account Number: [id number] Date of Birth/Sex: Treating RN: [birth year] (43 y.o. [name]) [name] Primary Care Bethanee Redondo: [name], [name] Other Clinician: Referring Jj Enyeart: Treating Lucille Witts/Extender:[name], [name], [name] in Treatment: 32 Compression Therapy Performed for  Wound Wound #13 Left,Proximal,Anterior Lower Leg Assessment: Performed By: Little Ishikawa, RN Compression Type: Four Layer Electronic Signature(s) Signed: 12/05/2019 5:25:54 PM By: [name] RN Entered By: [name] on 12/05/2019 09:01:57 -------------------------------------------------------------------------------- Compression Therapy Details Patient Name: Date of Service: AXLE, PARFAIT 12/05/2019 8:00 AM Medical Record [id number] Patient Account Number: [id number] Date of Birth/Sex: Treating RN: [birth year] (43 y.o. [name] Primary Care Refugia Laneve: [name], [name] Other Clinician: Referring Genieve Ramaswamy: Treating Cornelia Walraven/Extender:[name], [name], [name] in Treatment: 32 Compression Therapy Performed for Wound Wound #16 Left,Medial Calcaneus Assessment: Performed By: Little Ishikawa, RN Compression Type: Four Layer Electronic Signature(s) Signed: 12/05/2019 5:25:54 PM By: [name] RN Entered By: [name] on 12/05/2019 09:01:58 -------------------------------------------------------------------------------- Compression Therapy Details Patient Name: Date of Service: TYCEN, DOCKTER 12/05/2019 8:00 AM Medical Record [id number] Patient Account Number: [id number] Date of Birth/Sex: Treating RN: August 29, 1976 (43 y.o. [name] Primary Care Jeanne Diefendorf: [name], [name] Other Clinician: Referring [name]: Treating Zeek Rostron/Extender:[name], [name], [name] in Treatment: 32 Compression Therapy Performed for Wound Wound #9RR Left,Anterior Lower Leg Assessment: Performed By: Little Ishikawa, RN Compression Type: Four Layer Electronic Signature(s) Signed: 12/05/2019 5:25:54 PM By: [name] RN Entered By: [name] on 12/05/2019 09:01:58 -------------------------------------------------------------------------------- Encounter Discharge Information Details Patient Name: Date of Service: EARL, LOSEE 12/05/2019  8:00 AM Medical Record [id number] Patient Account Number: [id number] Date of Birth/Sex: Treating RN: January 30, 1976 (43 y.o. [name] Primary Care Mayrin Schmuck: [name], [name] Other Clinician: Referring Azelyn Batie: Treating Ashiyah Pavlak/Extender:[name], [name], [name] in Treatment: 32 Encounter Discharge Information Items Discharge Condition: Stable Ambulatory Status: Ambulatory Discharge Destination: Home Transportation: Private Auto Accompanied By: self Schedule Follow-up Appointment: Yes Clinical Summary of Care: Patient Declined Electronic Signature(s) Signed: 12/05/2019 5:25:54 PM By: [name] RN Entered By: [name] on 12/05/2019 09:04:11 -------------------------------------------------------------------------------- Patient/Caregiver Education Details Patient Name: Date of Service: Masden, Judy 2/5/2021andnbsp8:00 AM Medical Record [id number] Patient Account Number: [id number] Date of Birth/Gender: 31-Oct-1975 (43 y.o. M) Treating RN: [name] Primary Care Physician: [name], [name] Other Clinician: Referring  Physician: Treating Physician/Extender:[name], [name], [name] in Treatment: 32 Education Assessment Education Provided To: Patient Education Topics Provided Wound/Skin Impairment: Methods: Explain/Verbal Responses: State content correctly Electronic Signature(s) Signed: 12/05/2019 5:25:54 PM By: Carlene Coria RN Entered By: Carlene Coria on 12/05/2019 09:03:52 -------------------------------------------------------------------------------- Wound Assessment Details Patient Name: Date of Service: JIMMEY, HENGEL 12/05/2019 8:00 AM Medical Record [id number] Patient Account Number: [id number] Date of Birth/Sex: Treating RN: [birth year] (43 y.o. [name]) Carlene Coria Primary Care Justyna Timoney: [name], [name] Other Clinician: Referring Santino Kinsella: Treating Shandricka Monroy/Extender:[name], [name], [name] in Treatment:  32 Wound Status Wound Number: 13 Primary Venous Leg Ulcer Etiology: Wound Location: Left Lower Leg - Anterior, Proximal Wound Open Wounding Event: Gradually Appeared Status: Date Acquired: 09/23/2019 Comorbid Asthma, Sleep Apnea, Deep Vein Weeks Of Treatment: 9 History: Thrombosis, Phlebitis, Osteoarthritis, Clustered Wound: No Neuropathy Wound Measurements Length: (cm) 0.3 % Reduc Width: (cm) 0.4 % Reduc Depth: (cm) 0.2 Epithel Area: (cm) 0.094 Tunnel Volume: (cm) 0.019 Underm Wound Description Classification: Full Thickness Without Exposed Support Structures Wound Flat and Intact Margin: Exudate Small Amount: Exudate Serosanguineous Type: Exudate red, brown Color: Wound Bed Granulation Amount: Large (67-100%) Granulation Quality: Red Necrotic Amount: None Present (0%) Foul Odor After Cleansing: No Slough/Fibrino Yes Exposed Structure Fascia Exposed: No Fat Layer (Subcutaneous Tissue) Exposed: Yes Tendon Exposed: No Muscle Exposed: No Joint Exposed: No Bone Exposed: No tion in Area: 95.3% tion in Volume: 90.5% ialization: Small (1-33%) ing: No ining: No Treatment Notes Wound #13 (Left, Proximal, Anterior Lower Leg) 1. Cleanse With Wound Cleanser Soap and water 3. Primary Dressing Applied Hydrogel or K-Y Jelly Other primary dressing (specifiy in notes) 4. Secondary Dressing ABD Pad Dry Gauze 6. Support Layer Applied 4 layer compression wrap Notes sorbact, netting Electronic Signature(s) Signed: 12/05/2019 5:25:54 PM By: Carlene Coria RN Entered By: Carlene Coria on 12/05/2019 08:59:43 -------------------------------------------------------------------------------- Wound Assessment Details Patient Name: Date of Service: SEYMORE, BRODOWSKI 12/05/2019 8:00 AM Medical Record [id number] Patient Account Number: [id number] Date of Birth/Sex: Treating RN: 08-01-1976 (43 y.o. [name]) Carlene Coria Primary Care Lynwood Kubisiak: [name], [name] Other  Clinician: Referring Rhylan Gross: Treating Cully Luckow/Extender:[name], [name], [name] in Treatment: 32 Wound Status Wound Number: 16 Primary Lymphedema Etiology: Wound Location: Left Calcaneus - Medial Wound Open Wounding Event: Gradually Appeared Status: Date Acquired: 11/07/2019 Comorbid Asthma, Sleep Apnea, Deep Vein Weeks Of Treatment: 4 History: Thrombosis, Phlebitis, Osteoarthritis, Clustered Wound: No Neuropathy Wound Measurements Length: (cm) 0.4 % Reduct Width: (cm) 0.4 % Reduct Depth: (cm) 0.1 Epitheli Area: (cm) 0.126 Tunneli Volume: (cm) 0.013 Undermi Wound Description Full Thickness Without Exposed Support Foul Od Classification: Structures Slough/ Wound Flat and Intact Margin: Exudate Small Amount: Exudate Serosanguineous Type: Exudate red, brown Color: Wound Bed Granulation Amount: None Present (0%) Necrotic Amount: Large (67-100%) Fascia E Necrotic Quality: Adherent Slough Fat Layer Tendon Exp Muscle Exp Joint Expo Bone Expos or After Cleansing: No Fibrino No Exposed Structure xposed: No (Subcutaneous Tissue) Exposed: Yes osed: No osed: No sed: No ed: No ion in Area: 35.7% ion in Volume: 35% alization: Medium (34-66%) ng: No ning: No Treatment Notes Wound #16 (Left, Medial Calcaneus) 1. Cleanse With Wound Cleanser Soap and water 3. Primary Dressing Applied Hydrogel or K-Y Jelly Other primary dressing (specifiy in notes) 4. Secondary Dressing ABD Pad Dry Gauze 6. Support Layer Applied 4 layer compression wrap Notes sorbact, netting Electronic Signature(s) Signed: 12/05/2019 5:25:54 PM By: Carlene Coria RN Entered By: Carlene Coria on 12/05/2019 08:59:59 -------------------------------------------------------------------------------- Wound Assessment Details Patient Name: Date of Service: Files, Rachael 12/05/2019 8:00 AM  Medical Record [id number] Patient Account Number: [id number] Date of Birth/Sex: Treating  RN: 1976/10/03 (43 y.o. [name]) [name] Primary Care Numa Heatwole: [name], [name] Other Clinician: Referring Shayra Anton: Treating Giana Castner/Extender:[name], [name], [name] in Treatment: 32 Wound Status Wound Number: [id number] Primary Venous Leg Ulcer Etiology: Wound Location: Left Lower Leg - Anterior Wound Open Wounding Event: Gradually Appeared Status: Date Acquired: 03/31/2019 Comorbid Asthma, Sleep Apnea, Deep Vein Weeks Of Treatment: 32 History: Thrombosis, Phlebitis, Osteoarthritis, Clustered Wound: No Neuropathy Wound Measurements Length: (cm) 1.4 % Reductio Width: (cm) 0.6 % Reductio Depth: (cm) 0.1 Epithelial Area: (cm) 0.66 Tunneling Volume: (cm) 0.066 Undermini Wound Description Full Thickness Without Exposed Support Foul O Classification: Structures Slough Wound Flat and Intact Margin: Exudate Medium Amount: Exudate Serosanguineous Type: Exudate red, brown Color: Wound Bed Granulation Amount: Medium (34-66%) Granulation Quality: Red Fascia Necrotic Amount: Medium (34-66%) Fat Lay Necrotic Quality: Adherent Slough Tendon Muscle Joint E Bone Ex dor After Cleansing: No /Fibrino Yes Exposed Structure Exposed: No er (Subcutaneous Tissue) Exposed: Yes Exposed: No Exposed: No xposed: No posed: No n in Area: 95% n in Volume: 95% ization: Small (1-33%) : No ng: No Treatment Notes Wound #9RR (Left, Anterior Lower Leg) 1. Cleanse With Wound Cleanser Soap and water 3. Primary Dressing Applied Hydrogel or K-Y Jelly Other primary dressing (specifiy in notes) 4. Secondary Dressing ABD Pad Dry Gauze 6. Support Layer Applied 4 layer compression wrap Notes sorbact, netting Electronic Signature(s) Signed: 12/05/2019 5:25:54 PM By: [name] RN Entered By: [name] on 12/05/2019 09:00:15 -------------------------------------------------------------------------------- Vitals Details Patient Name: Date of Service: Mcphie, Farooq 12/05/2019  8:00 AM Medical Record [id number] Patient Account Number: [id number] Date of Birth/Sex: Treating RN: [birth year] (43 y.o. [name]) [name] Primary Care Ammanda Dobbins: [name], [name] Other Clinician: Referring Pistol Kessenich: Treating Jenel Gierke/Extender:[name], [name], [name] in Treatment: 32 Vital Signs Time Taken: 08:36 Temperature (F): 98.7 Height (in): 74 Pulse (bpm): 81 Weight (lbs): 325 Respiratory Rate (breaths/min): 18 Body Mass Index (BMI): 41.7 Blood Pressure (mmHg): 167/86 Reference Range: 80 - 120 mg / dl Electronic Signature(s) Signed: 12/05/2019 5:25:54 PM By: [name] RN Entered By: [name] on 12/05/2019 08:37:32

## 2019-12-12 ENCOUNTER — Other Ambulatory Visit: Payer: Self-pay

## 2019-12-12 ENCOUNTER — Encounter (HOSPITAL_BASED_OUTPATIENT_CLINIC_OR_DEPARTMENT_OTHER): Payer: 59 | Admitting: Internal Medicine

## 2019-12-12 DIAGNOSIS — L97422 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 layer exposed: Secondary | ICD-10-CM | POA: Diagnosis not present

## 2019-12-15 NOTE — Progress Notes (Signed)
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 progress the other wound has healed. We are using Hydrofera Blue 8/20; the patient has 2 open areas. The original inferior area and the new area superiorly. He has been using Hydrofera Blue ready but the wounds overall look dry. He has not managed to get a CT scan approved and hence has not [facility] 8[facility]27 most of the patient's wound areas on the anterior look epithelialized. There is still some eschar and vulnerability here. He has been wearing to  press stockings which should give him 30/40 mmHg compression. He also has compression pumps that belonged to his father. I think it would be reasonable to try and get  him external compression pumps. He was not using the compression pumps daily and these certainly need to be used daily. Finally he was approved for his CT scan on the right with follow-up with Dr. Trula Ore. We will asked Dr. Trula Ore to look at his left leg as well 9/3; the patient had 2 wound areas. Central tibial area of area is healed. Smaller wound superiorly still perhaps slightly open. I believe his CT scan on the right hip and pelvis areas on the 18th. That was ordered by Dr. Trula Ore. We have ordered him 30/40 stockings and are going to attempt to get him compression pumps 9/10-Patient returns at 1 week for the 2 small areas on his left anterior leg, these areas are healed. Patient is now going to going to the 30/40 stockings. His CT scan of his right leg hip and pelvis areas is scheduled 9/22 the patient was discharged from the clinic on 9/10. He tells me that he started developing pain on the left anterior tibial area and swelling on Thursday of last week. By Saturday the swelling was so prominent that he was scared to take his stocking off because he would be able to get it back on. There was increased pain. He was seen in the emergency department at [facility] on 9/1. 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 He was also felt to have cellulitis. There was apparently not any acute clot. He I he was put on a 3 time a day medication/antibiotic which I am assuming is clindamycin. He has 2 reopened areas both on the anterior tibia 9/29; he completed the clindamycin even though it caused diarrhea. The diarrhea is resolved. The cellulitis in the left leg that was prominent last week has  resolved. 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 I looked in [place] link I do not see the actual CT scan report. He is being apparently scheduled for an attempt at stenting retrograde and then if that does not work anterior grade by Dr. Trula Ore. We use silver alginate to the wound last week because of coexistent cellulitis 10/6; cellulitis on the left leg resolved. He is still waiting for insurance authorization for his pelvic vein stenting. Changed him to [facility] last week 10/13; cellulitis remains resolved. Wound is smaller. Using Hydrofera Blue under compression. In 2 days time he is going for his venous stenting hopefully by Dr. Trula Ore at [facility] 10/20; the patient went for his procedure last Thursday by Dr. Trula Ore although he does not remember in the postop period what he was told. He is not certain whether Dr. Trula Ore managed to get the stent then successfully. His wound is measuring smaller looks healthy on the left mid tibia. He is also having some discomfort behind his left knee that he had me look at which is the site where Dr. Trula Ore had venous access. I looked in care everywhere. I could not see a specific note from Dr. Trula Ore [[facility]] above the actual procedure. Presumably it is still in transfer therefore I could not really answer his question about whether the stent was placed successful 10/27; wound not quite as good as last week. Using Hydrofera Blue. He thinks it might of  stuck to the wound and was adherent when they took it off today. His procedure with Dr. Gladys Damme was not successful. He thinks that Dr. Gladys Damme will try to go at this from the other direction at some point. He sees him in 2-1/2-week 11/3; quite an improvement in wound surface area this week. We are using Hydrofera Blue. No need to change the dressing.  Follows up with Dr. Gladys Damme in about a week and a half 11/10; wound surface not as good this week at all. No changes in dimensions. We have been using Hydrofera Blue. As well he had tells [place] he had discomfort in his foot all week although he admits he was up on this more than usual. He comes in today with 2 small punched out areas on the left lateral foot. He has severe venous hypertension. He sees Dr. Gladys Damme again on 11/16 11/17; not much change in the area on the anterior tibia mid aspect. He also has 2 areas on the left lateral foot. We have been using Sorbact on the left anterior and Iodosorb ointment on the left foot. He is under 4-layer compression. The patient went to see Dr. Gladys Damme I do not yet have access to this note and care everywhere. However according to the patient there is an option to try and address his venous occlusion I think via a transjugular approach. This is complicated by the fact that the IVC filter is there and would have to be traversed. According to the patient there is about a 33% chance of perforation may be even aortic perforation. However they would be prepared for this. He is thinking about this and discussing it with his wife Thanks to our case manager this week we are able to determine if for some reason the patient is not using his compression pumps. He also is not able to get stockings on the right leg which are 30/40 below-knee. I think we may need to order external compression garments 12/1; patient arrives with the area on the left anterior mid tibia expanded superiorly small rim of skin between the original area and the new area. He has painful small denuded areas on the left medial and left lateral heel. We have been using Iodoflex. His next appointment with the Dr. Gladys Damme is January 4. after the holidays to discuss if he would like to pursue this endovascular option. Dr. Matilde Haymaker notes below from last visit Subjective Ranger Petrich is a 44 y.o. 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 Unfortunately during the procedure a stent was not able to be placed d/t narrowing at the IVC. Per the patient the procedure provided 2-3 days of symptomatic relief (decrease in L LE pressure and pain) but after that his symptoms returned with increased pressure, pain in the hips and pain in the legs when standing. The patient does report his L LE wound is stable especially when he is consistently attending his wound care clinic in [place]. The patient denies using compressive LE therapies unless done at his wound clinic due to not being able to reach his legs. He is interested in learning more and possibly pursing further interventional options for his L lower extremity via access through neck. *Objective Physical Exam: BP 171/86  Pulse 80  Temp 37 C (Temporal)  Wt (!) 147.4 kg (325 lb)  BMI 41.73 kg/m General: awake, alert and oriented x 3 Chest: Non labored breathing on room air CVS: Normal rate. Regular rhythm. ABD: soft, NT, ND Ext: bilateral leg edema w/ left leg in compressive dressing from wound clinic. Evidence of skin discoloration on both feet bilaterally. Data Review: Labs: None to review Imaging:Reviewed report of 10/15 L LE venogram. I saw and evaluated the patient, participating in the key portions of the service. I reviewed the residentoos note. I agree with the residentoos findings and plan. Electronically signed by Derry Skill, MD at 09/16/2019 10:53 AM EST 12/11 the areas around his ankles have healed. 2 areas in the center part of the mid tibia area are still open we have been using silver collagen. Procedure attempt by Dr. Trula Ore at [facility] on 11/03/2019 12/18; the wounds around his  ankles remain closed. I think there has been some improvement in the 2 areas in the center part of the mid tibia. Especially superiorly. Debris on the center required debridement we have been using silver collagen 12/31; I saw the patient briefly last week when he was here for a nurse visit. He had irritation in which look believed to be localized cellulitis and folliculitis on the lateral left calf. I gave him a prescription for cephalexin although he admits today he never filled it. The area still looked very angry. His original wounds we have been treating where a figure-of-eight shaped area on the anterior tibial area mid aspect. We have been using silver collagen these do not look healthy. 11/07/2019. The patient has completed his antibiotics. The area of folliculitis on the left lateral calf looks somewhat better although there is still erythema there is no tenderness. I am going to put some more compression on this area and will have a look at this next week. No additional antibiotics. Since the patient was last here he went to see Dr. Trula Ore at [facility]. I am able to see his note from 11/03/2019 in care everywhere. The patient has a history of a right iliac vein stent and IVC filter placement. He is status post angioplasty of the left common iliac vein on 08/14/2019 for chronic obstruction and venous insufficiency. Unfortunately a stent could not be placed during the procedure due to narrowing at the IVC from fibrotic change of the right iliac vein stent. As I understand things they are now going to approach this from both sides i.e. through an area in the internal jugular vein as well as the more standard distal approach and see if this area can be stented. The thought was 5 to 6 weeks before this could be arranged as it will "require 2 surgeons, insurance approval etc. The original wound on his anterior tibia looks better. He has the area on the left lateral tibia with localized swelling. He has  a new area on the left medial calcaneus which is small punched out and very painful. I think this looks similar to wounds he has had in this area before. He has severe venous hypertension 1/15; anterior tibia wound has come down from a figure-of-eight to 1 wound. Has some depth which looks clean. The area on the left lateral calf looks better. We put additional compression in here and the edema in this area looks better. His major complaint is on the medial calcaneus. He had mentioned this last week and he has had wounds like this before. Pain is severe 1/22; anterior tibial wound seems to be closing  at with shallower and better looking wound margin. He has an area anteriorly that I think was initially an area of folliculitis the area on the left lateral calf has closed over The small area on the medial heel that I cultured last week grew methicillin sensitive staph aureus but resistant to the doxycycline I gave him. I substituted cephalexin 500 every 6 and has only been on this for 2 days. Still says the pain is very significant 1/29; all the patient's wounds look quite good today including the anterior tibial which is closing in. The area above this also closing in finally the very painful area on the medial heel. He is completing antibiotics today this cultured methicillin sensitive staph aureus. Back to using silver alginate on the wounds 2/12; in general things are quite good here. He still does not have a date for attempting to stand his left common iliac vein. Still an insurance issue. I have been encouraged him to move on this by calling his insurance company. There is not a good option here. Objective Constitutional Patient is hypertensive.. Pulse regular and within target range for patient.Marland Kitchen Respirations regular, non-labored and within target range.. Temperature is normal and within the target range for the patient.Marland Kitchen Appears in no distress. Vitals Time Taken: 8:00 AM, Height: 74 in,  Weight: 325 lbs, BMI: 41.7, Temperature: 98.7 F, Pulse: 91 bpm, Respiratory Rate: 18 breaths/min, Blood Pressure: 162/91 mmHg. Cardiovascular Pulses are palpable. Excellent edema control. General Notes: Wound exam ooThe area in the mid tibia continues to contract. Epithelialization moving into the small divot here. On the medial calcaneus on the left he has an eschar he still says this is tender. No surrounding erythema. I elected to leave this in place for another week. Wounds debrided with Anasept and gauze Integumentary (Hair, Skin) I see no evidence of infection in any wound area. Wound #13 status is Open. Original cause of wound was Gradually Appeared. The wound is located on the Left,Proximal,Anterior Lower Leg. The wound measures 0.1cm length x 0.1cm width x 0.1cm depth; 0.008cm^2 area and 0.001cm^3 volume. There is Fat Layer (Subcutaneous Tissue) Exposed exposed. There is no tunneling or undermining noted. There is a small amount of serosanguineous drainage noted. The wound margin is flat and intact. There is large (67-100%) red granulation within the wound bed. There is no necrotic tissue within the wound bed. Wound #16 status is Open. Original cause of wound was Gradually Appeared. The wound is located on the Left,Medial Calcaneus. The wound measures 0.1cm length x 0.1cm width x 0.1cm depth; 0.008cm^2 area and 0.001cm^3 volume. There is Fat Layer (Subcutaneous Tissue) Exposed exposed. There is no tunneling or undermining noted. There is a small amount of serosanguineous drainage noted. The wound margin is flat and intact. There is no granulation within the wound bed. There is a large (67-100%) amount of necrotic tissue within the wound bed including Adherent Slough. Wound #9RR status is Open. Original cause of wound was Gradually Appeared. The wound is located on the Left,Anterior Lower Leg. The wound measures 1.5cm length x 0.5cm width x 0.2cm depth; 0.589cm^2 area and 0.118cm^3  volume. There is Fat Layer (Subcutaneous Tissue) Exposed exposed. There is no tunneling or undermining noted. There is a medium amount of serosanguineous drainage noted. The wound margin is flat and intact. There is medium (34-66%) red granulation within the wound bed. There is a medium (34-66%) amount of necrotic tissue within the wound bed including Adherent Slough. Assessment Active Problems ICD-10 Non-pressure chronic ulcer of  other part of left lower leg limited to breakdown of skin 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 layer exposed Chronic venous hypertension (idiopathic) with inflammation of left lower extremity Non-pressure chronic ulcer of other part of left foot with other specified severity Lymphedema, not elsewhere classified Other thrombophilia Procedures Wound #13 Pre-procedure diagnosis of Wound #13 is a Venous Leg Ulcer located on the Left,Proximal,Anterior Lower Leg . There was a Four Layer Compression Therapy Procedure by [name], RN. Post procedure Diagnosis Wound #13: Same as Pre-Procedure Wound #16 Pre-procedure diagnosis of Wound #16 is a Lymphedema located on the Left,Medial Calcaneus . There was a Four Layer Compression Therapy Procedure by [name], RN. Post procedure Diagnosis Wound #16: Same as Pre-Procedure Wound #9RR Pre-procedure diagnosis of Wound #9RR is a Venous Leg Ulcer located on the Left,Anterior Lower Leg . There was a Four Layer Compression Therapy Procedure by [name], RN. Post procedure Diagnosis Wound #9RR: Same as Pre-Procedure Plan Follow-up Appointments: Return Appointment in 1 week. - Friday Dressing Change Frequency: Do not change entire dressing for one week. Skin Barriers/Peri-Wound Care: TCA Cream or Ointment - to left leg. Wound Cleansing: May shower with protection. Primary Wound Dressing: Wound #13 Left,Proximal,Anterior Lower Leg: Other: - cutimed sorbact swab with  hydrogel. Wound #16 Left,Medial Calcaneus: Calcium Alginate with Silver Wound #9RR Left,Anterior Lower Leg: Other: - cutimed sorbact swab with hydrogel. Secondary Dressing: Dry Gauze ABD pad - extra ABD pad to lateral leg for increased compression Edema Control: 4 layer compression: Left lower extremity - Add extra folded ABD pad to lateral leg for additional compression Avoid standing for long periods of time Elevate legs to the level of the heart or above for 30 minutes daily and/or when sitting, a frequency of: Support Garment 30-40 mm/Hg pressure to: - right leg : apply compression stockings dual layer in the am and remove at night. Segmental Compressive Device. - lymphedema pumps 1 hour 2 times per day. 1. I decided to put silver alginate on the left medial calcaneus. I may need to remove the eschar next week I decided to give this another week 2. To the rest of the wounds we are continuing with Sorbact. Nice response 3. We have excellent edema control 4. I have asked him to consider phoning his insurance to see when they will approve the approach outlined previously by Dr. Jacolyn Reedy at [facility]. This is to stent his left common iliac vein I believe Electronic Signature(s) Signed: 12/12/2019 5:45:04 PM By: Baltazar Najjar MD Entered By: Baltazar Najjar on 12/12/2019 08:49:47 -------------------------------------------------------------------------------- SuperBill Details Patient Name: Date of Service: [name], [name] 12/12/2019 Medical Record [id number] Patient Account Number: [id number] Date of Birth/Sex: Treating RN: [birth year] (44 y.o. [name] Primary Care Provider: [name], [name] Other Clinician: Referring Provider: Treating Provider/Extender:Samiel Peel, [name], [name] in Treatment: 33 Diagnosis Coding ICD-10 Codes Code Description L97.821 Non-pressure chronic ulcer of other part of left lower leg limited to breakdown of skin L97.422 Non-pressure  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 with fat layer exposed I87.322 Chronic venous hypertension (idiopathic) with inflammation of left lower extremity L97.528 Non-pressure chronic ulcer of other part of left foot with other specified severity I89.0 Lymphedema, not elsewhere classified D68.69 Other thrombophilia Facility Procedures CPT4 Code Description: [id number] (Facility Use Only) 29581LT - APPLY MULTLAY COMPRS LWR LT LEG Modifier: Quantity: 1 Physician Procedures CPT4 Code Description: [id number] - WC PHYS LEVEL 3 - EST PT ICD-10 Diagnosis Description L97.821 Non-pressure chronic ulcer of other part  of left lower leg skin I87.322 Chronic venous hypertension (idiopathic) with inflammation Modifier: limited to breakdo of left lower extr Quantity: 1 wn of emity Electronic Signature(s) Signed: 12/12/2019 5:45:04 PM By: Baltazar Najjar MD Entered By: Baltazar Najjar on 12/12/2019 08:50:24

## 2019-12-15 NOTE — Progress Notes (Signed)
[name], [name] ([id number]) Visit Report for 12/12/2019 Arrival Information Details Patient Name: Date of Service: [name], [name] 12/12/2019 7:30 AM Medical Record [id number] Patient Account Number: [id number] Date of Birth/Sex: Treating RN: 05/24/1976 (43 y.o. [name]) Carlene Coria Primary Care Tineshia Becraft: [name], [name] Other Clinician: Referring Syre Knerr: Treating Hedy Garro/Extender:[name], [name], [name] in Treatment: 34 Visit Information History Since Last Visit All ordered tests and consults were completed: No Patient Arrived: Ambulatory Added or deleted any medications: No Arrival Time: 07:58 Any new allergies or adverse reactions: No Accompanied By: self Had a fall or experienced change in No Transfer Assistance: None activities of daily living that may affect Patient Identification Verified: Yes risk of falls: Secondary Verification Process Completed: Yes Signs or symptoms of abuse/neglect since last No Patient Requires Transmission-Based No visito Precautions: Hospitalized since last visit: No Patient Has Alerts: No Has Dressing in Place as Prescribed: Yes Has Compression in Place as Prescribed: Yes Pain Present Now: Yes Electronic Signature(s) Signed: 12/12/2019 5:18:45 PM By: Carlene Coria RN Entered By: Carlene Coria on 12/12/2019 08:00:27 -------------------------------------------------------------------------------- Compression Therapy Details Patient Name: Date of Service: [name], [name] 12/12/2019 7:30 AM Medical Record [id number] Patient Account Number: [id number] Date of Birth/Sex: Treating RN: 1975/11/19 (44 y.o. [name] Primary Care Kayliegh Boyers: [name], [name] Other Clinician: Referring Hutch Rhett: Treating Haneef Hallquist/Extender:[name], [name], [name] in Treatment: 33 Compression Therapy Performed for Wound Wound #16 Left,Medial Calcaneus Assessment: Performed By: Clinician Kela Millin, RN Compression Type: Four  Layer Post Procedure Diagnosis Same as Pre-procedure Electronic Signature(s) Signed: 12/15/2019 6:12:41 PM By: Kela Millin Entered By: Kela Millin on 12/12/2019 08:28:03 -------------------------------------------------------------------------------- Compression Therapy Details Patient Name: Date of Service: [name], [name] 12/12/2019 7:30 AM Medical Record [id number] Patient Account Number: [id number] Date of Birth/Sex: Treating RN: Apr 13, 1976 (44 y.o. [name] Primary Care Jahkai Yandell: [name], [name] Other Clinician: Referring Graylee Arutyunyan: Treating Raiza Kiesel/Extender:[name], [name], [name] in Treatment: 33 Compression Therapy Performed for Wound Wound #13 Left,Proximal,Anterior Lower Leg Assessment: Performed By: Clinician Kela Millin, RN Compression Type: Four Layer Post Procedure Diagnosis Same as Pre-procedure Electronic Signature(s) Signed: 12/15/2019 6:12:41 PM By: Kela Millin Entered By: Kela Millin on 12/12/2019 08:28:03 -------------------------------------------------------------------------------- Compression Therapy Details Patient Name: Date of Service: [name], [name] 12/12/2019 7:30 AM Medical Record [id number] Patient Account Number: [id number] Date of Birth/Sex: Treating RN: 03/02/1976 (44 y.o. [name] Primary Care Ronne Stefanski: [name], [name] Other Clinician: Referring Duwane Gewirtz: Treating Frederica Chrestman/Extender:[name], [name], [name] in Treatment: 33 Compression Therapy Performed for Wound Wound #9RR Left,Anterior Lower Leg Assessment: Performed By: Clinician Kela Millin, RN Compression Type: Four Layer Post Procedure Diagnosis Same as Pre-procedure Electronic Signature(s) Signed: 12/15/2019 6:12:41 PM By: Kela Millin Entered By: Kela Millin on 12/12/2019 08:28:03 -------------------------------------------------------------------------------- Encounter Discharge  Information Details Patient Name: Date of Service: [name], [name] 12/12/2019 7:30 AM Medical Record [id number] Patient Account Number: [id number] Date of Birth/Sex: Treating RN: 12-22-1975 (44 y.o. [name] Primary Care Willard Farquharson: [name], [name] Other Clinician: Referring Levern Kalka: Treating Ansley Mangiapane/Extender:[name], [name], [name] in Treatment: 50 Encounter Discharge Information Items Discharge Condition: Stable Ambulatory Status: Ambulatory Discharge Destination: Home Transportation: Private Auto Accompanied By: self Schedule Follow-up Appointment: Yes Clinical Summary of Care: Electronic Signature(s) Signed: 12/12/2019 5:41:48 PM By: Deon Pilling Entered By: Deon Pilling on 12/12/2019 09:04:23 -------------------------------------------------------------------------------- Lower Extremity Assessment Details Patient Name: Date of Service: [name], [name] 12/12/2019 7:30 AM Medical Record [id number] Patient Account Number: [id number] Date of Birth/Sex: Treating RN: 1976-03-15 (43 y.o. Oval Linsey Primary Care Hilbert Briggs: [name], [name] Other Clinician: Referring Jaxon Flatt: Treating Abyan Cadman/Extender:[name], [name],  [name] in Treatment: 33 Edema Assessment Assessed: [Left: No] [Right: No] Edema: [Left: Ye] [Right: s] Calf Left: Right: Point of Measurement: 35 cm From Medial Instep 45 cm cm Ankle Left: Right: Point of Measurement: 11.5 cm From Medial Instep 23 cm cm Electronic Signature(s) Signed: 12/12/2019 5:18:45 PM By: Carlene Coria RN Entered By: Carlene Coria on 12/12/2019 08:11:14 -------------------------------------------------------------------------------- Multi Wound Chart Details Patient Name: Date of Service: [name], [name] 12/12/2019 7:30 AM Medical Record [id number] Patient Account Number: [id number] Date of Birth/Sex: Treating RN: August 05, 1976 (44 y.o. [name] Primary Care Markeia Harkless: [name],  [name] Other Clinician: Referring Amiaya Mcneeley: Treating Narcissa Melder/Extender:[name], [name], [name] in Treatment: 72 Vital Signs Height(in): 74 Pulse(bpm): 91 Weight(lbs): 325 Blood Pressure(mmHg): 162/91 Body Mass Index(BMI): 42 Temperature(F): 98.7 Respiratory 18 Rate(breaths/min): Photos: [13:No Photos] [16:No Photos] [9RR:No Photos] Wound Location: [13:Left Lower Leg - Anterior, Left Calcaneus - Medial Proximal] [9RR:Left Lower Leg - Anterior] Wounding Event: [13:Gradually Appeared] [16:Gradually Appeared] [9RR:Gradually Appeared] Primary Etiology: [13:Venous Leg Ulcer] [16:Lymphedema] [9RR:Venous Leg Ulcer] Comorbid History: [13:Asthma, Sleep Apnea, DeepAsthma, Sleep Apnea, DeepAsthma, Sleep Apnea, Deep Vein Thrombosis, Phlebitis, Vein Thrombosis, Phlebitis, Vein Thrombosis, Phlebitis, Osteoarthritis, Neuropathy Osteoarthritis, Neuropathy Osteoarthritis,  Neuropathy] Date Acquired: [13:09/23/2019] [16:11/07/2019] [9RR:03/31/2019] Weeks of Treatment: [13:10] [16:5] [9RR:33] Wound Status: [13:Open] [16:Open] [9RR:Open] Wound Recurrence: [13:No] [16:No] [9RR:Yes] Measurements L x W x D 0.1x0.1x0.1 [16:0.1x0.1x0.1] [9RR:1.5x0.5x0.2] (cm) Area (cm) : [13:0.008] [65:4.650] [9RR:0.589] Volume (cm) : [13:0.001] [16:0.001] [9RR:0.118] % Reduction in Area: [13:99.60%] [16:95.90%] [9RR:95.50%] % Reduction in Volume: 99.50% [16:95.00%] [9RR:91.10%] Classification: [13:Full Thickness Without Exposed Support Structures Exposed Support Structures Exposed Support Structures] [16:Full Thickness Without] [9RR:Full Thickness Without] Exudate Amount: [13:Small] [16:Small] [9RR:Medium] Exudate Type: [13:Serosanguineous] [16:Serosanguineous] [9RR:Serosanguineous] Exudate Color: [13:red, brown] [16:red, brown] [9RR:red, brown] Wound Margin: [13:Flat and Intact] [16:Flat and Intact] [9RR:Flat and Intact] Granulation Amount: [13:Large (67-100%)] [16:None Present (0%)] [9RR:Medium  (34-66%)] Granulation Quality: [13:Red] [16:N/A] [9RR:Red] Necrotic Amount: [13:None Present (0%)] [16:Large (67-100%)] [9RR:Medium (34-66%)] Exposed Structures: [13:Fat Layer (Subcutaneous Fat Layer (Subcutaneous Fat Layer (Subcutaneous Tissue) Exposed: Yes Fascia: No Tendon: No Muscle: No Joint: No Bone: No] [16:Tissue) Exposed: Yes Fascia: No Tendon: No Muscle: No Joint: No Bone: No] [9RR:Tissue) Exposed: Yes  Fascia: No Tendon: No Muscle: No Joint: No Bone: No] Epithelialization: [13:Small (1-33%) Compression Therapy] [16:Medium (34-66%) Compression Therapy] [9RR:Small (1-33%) Compression Therapy] Treatment Notes Electronic Signature(s) Signed: 12/12/2019 5:45:04 PM By: Linton Ham MD Signed: 12/15/2019 6:12:41 PM By: Kela Millin Entered By: Linton Ham on 12/12/2019 08:44:56 -------------------------------------------------------------------------------- Multi-Disciplinary Care Plan Details Patient Name: Date of Service: [name], [name] 12/12/2019 7:30 AM Medical Record [id number] Patient Account Number: [id number] Date of Birth/Sex: Treating RN: 10/23/1976 (44 y.o. [name] Primary Care Jeanann Balinski: [name], [name] Other Clinician: Referring Omni Dunsworth: Treating Dekendrick Uzelac/Extender:[name], [name], [name] in Treatment: 33 Active Inactive Venous Leg Ulcer Nursing Diagnoses: Actual venous Insuffiency (use after diagnosis is confirmed) Knowledge deficit related to disease process and management Goals: Patient will maintain optimal edema control Date Initiated: 11/14/2019 Target Resolution Date: 01/09/2020 Goal Status: Active Interventions: Assess peripheral edema status every visit. Compression as ordered Treatment Activities: Therapeutic compression applied : 11/14/2019 Notes: Wound/Skin Impairment Nursing Diagnoses: Knowledge deficit related to ulceration/compromised skin integrity Goals: Patient/caregiver will verbalize understanding of  skin care regimen Date Initiated: 07/22/2019 Target Resolution Date: 01/09/2020 Goal Status: Active Ulcer/skin breakdown will have a volume reduction of 30% by week 4 Target Resolution Date Initiated: 07/22/2019 Date Inactivated: 08/26/2019 Date: 08/22/2019 Goal Status: Met Ulcer/skin breakdown will have a volume reduction of 50% by week 8 Target  Resolution Date Initiated: 08/26/2019 Date Inactivated: 09/30/2019 Date: 09/19/2019 Goal Status: Unmet Unmet Reason: comorbities Ulcer/skin breakdown will have a volume reduction of 80% by week 12 Date Initiated: 09/30/2019 Target Resolution Date: 01/09/2020 Goal Status: Active Interventions: Assess patient/caregiver ability to obtain necessary supplies Assess patient/caregiver ability to perform ulcer/skin care regimen upon admission and as needed Assess ulceration(s) every visit Notes: Electronic Signature(s) Signed: 12/15/2019 6:12:41 PM By: Kela Millin Entered By: Kela Millin on 12/12/2019 08:02:07 -------------------------------------------------------------------------------- Pain Assessment Details Patient Name: Date of Service: [name], [name] 12/12/2019 7:30 AM Medical Record [id number] Patient Account Number: [id number] Date of Birth/Sex: Treating RN: [birth year] (43 y.o. Oval Linsey Primary Care Fortunato Nordin: [name], [name] Other Clinician: Referring Taris Galindo: Treating Xzavier Swinger/Extender:[name], [name], [name] in Treatment: 33 Active Problems Location of Pain Severity and Description of Pain Patient Has Paino Yes Site Locations With Dressing Change: Yes With Dressing Change: Yes Duration of the Pain. Constant / Intermittento Constant Rate the pain. Current Pain Level: 2 Worst Pain Level: 3 Least Pain Level: 2 Tolerable Pain Level: 5 Character of Pain Describe the Pain: Throbbing Pain Management and Medication Current Pain Management: Medication: Yes Cold Application: No Rest:  Yes Massage: No Activity: No T.E.N.S.: No Heat Application: No Leg drop or elevation: No Is the Current Pain Management Adequate: Inadequate How does your wound impact your activities of daily livingo Sleep: No Bathing: No Appetite: No Relationship With Others: No Bladder Continence: No Emotions: No Bowel Continence: No Work: No Toileting: No Drive: No Dressing: No Hobbies: No Electronic Signature(s) Signed: 12/12/2019 5:18:45 PM By: Carlene Coria RN Entered By: Carlene Coria on 12/12/2019 08:01:20 -------------------------------------------------------------------------------- Patient/Caregiver Education Details Patient Name: Date of Service: [name] 2/12/2021andnbsp7:30 AM Medical Record [id number] Patient Account Number: [id number] Date of Birth/Gender: Nov 07, 1975 (44 y.o. M) Treating RN: Kela Millin Primary Care Physician: [name], [name] Other Clinician: Referring Physician: Treating Physician/Extender:[name], [name], [name] in Treatment: 61 Education Assessment Education Provided To: Patient Education Topics Provided Wound/Skin Impairment: Methods: Explain/Verbal Responses: State content correctly Electronic Signature(s) Signed: 12/15/2019 6:12:41 PM By: Kela Millin Entered By: Kela Millin on 12/12/2019 08:03:00 -------------------------------------------------------------------------------- Wound Assessment Details Patient Name: Date of Service: [name], [name] 12/12/2019 7:30 AM Medical Record [id number] Patient Account Number: [id number] Date of Birth/Sex: Treating RN: 22-Mar-1976 (44 y.o. [name] Primary Care Damel Querry: [name], [name] Other Clinician: Referring Coralynn Gaona: Treating Shanon Becvar/Extender:[name], [name], [name] in Treatment: 33 Wound Status Wound Number: 13 Primary Venous Leg Ulcer Etiology: Wound Location: Left Lower Leg - Anterior, Proximal Wound Open Wounding Event:  Gradually Appeared Status: Date Acquired: 09/23/2019 Comorbid Asthma, Sleep Apnea, Deep Vein Weeks Of Treatment: 10 History: Thrombosis, Phlebitis, Osteoarthritis, Clustered Wound: No Neuropathy Photos Wound Measurements Length: (cm) 0.1 % Reduc Width: (cm) 0.1 % Reduc Depth: (cm) 0.1 Epithel Area: (cm) 0.008 Tunnel Volume: (cm) 0.001 Underm Wound Description Classification: Full Thickness Without Exposed Support Foul Od Structures Slough/ Wound Flat and Intact Margin: Exudate Small Amount: Exudate Exudate Serosanguineous Type: Exudate red, brown Color: Wound Bed Granulation Amount: Large (67-100%) Granulation Quality: Red Fascia Expo Necrotic Amount: None Present (0%) Fat Layer ( Tendon Expo Muscle Expo Joint Expos Bone Expose or After Cleansing: No Fibrino Yes Exposed Structure sed: No Subcutaneous Tissue) Exposed: Yes sed: No sed: No ed: No d: No tion in Area: 99.6% tion in Volume: 99.5% ialization: Small (1-33%) ing: No ining: No Electronic Signature(s) Signed: 12/12/2019 4:39:06 PM By: Mikeal Hawthorne EMT/HBOT Signed: 12/15/2019 6:12:41 PM By: Kela Millin Entered By: Mikeal Hawthorne on 12/12/2019 15:03:30 -------------------------------------------------------------------------------- Wound  Assessment Details Patient Name: Date of Service: [name], [name] 12/12/2019 7:30 AM Medical Record [id number] Patient Account Number: [id number] Date of Birth/Sex: Treating RN: 02-Dec-1975 (44 y.o. [name] Primary Care Joachim Carton: [name], [name] Other Clinician: Referring Omkar Stratmann: Treating Kaileia Flow/Extender:[name], [name], [name] in Treatment: 33 Wound Status Wound Number: 16 Primary Lymphedema Etiology: Wound Location: Left Calcaneus - Medial Wound Open Wounding Event: Gradually Appeared Status: Date Acquired: 11/07/2019 Comorbid Asthma, Sleep Apnea, Deep Vein Weeks Of Treatment: 5 History: Thrombosis, Phlebitis,  Osteoarthritis, Clustered Wound: No Neuropathy Photos Wound Measurements Length: (cm) 0.1 % Reduction Width: (cm) 0.1 % Reduction Depth: (cm) 0.1 Epitheliali Area: (cm) 0.008 Tunneli Volume: (cm) 0.001 Undermi Wound Description Classification: Full Thickness Without Exposed Support Foul Odo Structures Slough/F Wound Flat and Intact Margin: Exudate Small Amount: Exudate Serosanguineous Type: Exudate red, brown Color: Wound Bed Granulation Amount: None Present (0%) Necrotic Amount: Large (67-100%) Fascia E Necrotic Quality: Adherent Slough Fat Laye Tendon E Muscle E Joint Ex Bone Exp r After Cleansing: No ibrino No Exposed Structure xposed: No r (Subcutaneous Tissue) Exposed: Yes xposed: No xposed: No posed: No osed: No in Area: 95.9% in Volume: 95% zation: Medium (34-66%) ng: No ning: No Treatment Notes Wound #16 (Left, Medial Calcaneus) 1. Cleanse With Wound Cleanser Soap and water 2. Periwound Care TCA Cream 3. Primary Dressing Applied Hydrogel or K-Y Jelly Other primary dressing (specifiy in notes) 4. Secondary Dressing Dry Gauze 6. Support Layer Applied 4 layer compression wrap Notes sorbact. 2 layer compression [place] used. Electronic Signature(s) Signed: 12/12/2019 4:39:06 PM By: Mikeal Hawthorne EMT/HBOT Signed: 12/15/2019 6:12:41 PM By: Kela Millin Entered By: Mikeal Hawthorne on 12/12/2019 15:04:50 -------------------------------------------------------------------------------- Wound Assessment Details Patient Name: Date of Service: TAHJAY, BINION 12/12/2019 7:30 AM Medical Record [id number] Patient Account Number: [id number] Date of Birth/Sex: Treating RN: 07-14-1976 (45 y.o. [name] Primary Care Decoda Van: [name], [name] Other Clinician: Referring Artha Chiasson: Treating Tesa Meadors/Extender:[name], [name], [name] in Treatment: 33 Wound Status Wound Number: [id number] Primary Venous Leg Ulcer Etiology: Wound  Location: Left Lower Leg - Anterior Wound Open Wounding Event: Gradually Appeared Status: Date Acquired: 03/31/2019 Comorbid Asthma, Sleep Apnea, Deep Vein Thrombosis, Weeks Of Treatment: 33 History: Phlebitis, Osteoarthritis, Neuropathy Clustered Wound: No Photos Wound Measurements Length: (cm) 1.5 % Reduct Width: (cm) 0.5 % Reduct Depth: (cm) 0.2 Epitheli Area: (cm) 0.589 Tunneli Volume: (cm) 0.118 Undermi Wound Description Classification: Full Thickness Without Exposed Support Foul Odo Structures Slough/F Wound Flat and Intact Margin: Exudate Medium Amount: Exudate Serosanguineous Type: Exudate red, brown Color: Wound Bed Granulation Amount: Medium (34-66%) Granulation Quality: Red Fascia E Necrotic Amount: Medium (34-66%) Fat Laye Necrotic Quality: Adherent Slough Tendon E Muscle E Joint Ex Bone Exp r After Cleansing: No ibrino Yes Exposed Structure xposed: No r (Subcutaneous Tissue) Exposed: Yes xposed: No xposed: No posed: No osed: No ion in Area: 95.5% ion in Volume: 91.1% alization: Small (1-33%) ng: No ning: No Treatment Notes Wound #9RR (Left, Anterior Lower Leg) 1. Cleanse With Wound Cleanser Soap and water 2. Periwound Care TCA Cream 3. Primary Dressing Applied Hydrogel or K-Y Jelly Other primary dressing (specifiy in notes) 4. Secondary Dressing ABD Pad 6. Support Layer Applied 4 layer compression wrap Notes [place] 2 layer 30-60mHg compression wrap used. primary dressing cutimed sorbact swab. Electronic Signature(s) Signed: 12/12/2019 4:39:06 PM By: JMikeal HawthorneEMT/HBOT Signed: 12/15/2019 6:12:41 PM By: DKela MillinEntered By: JMikeal Hawthorneon 12/12/2019 15:03:49 -------------------------------------------------------------------------------- Vitals Details Patient Name: Date of Service: BDESTEN, MANOR2/09/2020 7:30 AM Medical Record [id number]  Patient Account Number: [id number] Date of Birth/Sex: Treating  RN: 12-18-1975 (43 y.o. [name]) Carlene Coria Primary Care Roe Koffman: [name], [name] Other Clinician: Referring Nainika Newlun: Treating Shadiamond Koska/Extender:[name], [name], [name] in Treatment: 33 Vital Signs Time Taken: 08:00 Temperature (F): 98.7 Height (in): 74 Pulse (bpm): 91 Weight (lbs): 325 Respiratory Rate (breaths/min): 18 Body Mass Index (BMI): 41.7 Blood Pressure (mmHg): 162/91 Reference Range: 80 - 120 mg / dl Electronic Signature(s) Signed: 12/12/2019 5:18:45 PM By: Carlene Coria RN Entered By: Carlene Coria on 12/12/2019 08:00:48

## 2019-12-19 ENCOUNTER — Other Ambulatory Visit: Payer: Self-pay

## 2019-12-19 ENCOUNTER — Encounter (HOSPITAL_BASED_OUTPATIENT_CLINIC_OR_DEPARTMENT_OTHER): Payer: 59 | Admitting: Internal Medicine

## 2019-12-19 DIAGNOSIS — L97422 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 layer exposed: Secondary | ICD-10-CM | POA: Diagnosis not present

## 2019-12-19 NOTE — Progress Notes (Signed)
[name], [name] ([id number]) Visit Report for 12/19/2019 Arrival Information Details Patient Name: Date of Service: [name], [name] 12/19/2019 7:30 AM Medical Record [id number] Patient Account Number: [id number] Date of Birth/Sex: Treating RN: [birth year] (44 y.o. [name] Primary Care Isella Slatten: [name], [name] Other Clinician: Referring Jesly Hartmann: Treating Jahniah Pallas/Extender:[name], [name], [name] in Treatment: 72 Visit Information History Since Last Visit Added or deleted any medications: No Patient Arrived: Ambulatory Any new allergies or adverse reactions: No Arrival Time: 07:39 Had a fall or experienced change in No Accompanied By: self activities of daily living that may affect Transfer Assistance: None risk of falls: Patient Identification Verified: Yes Signs or symptoms of abuse/neglect since last No Secondary Verification Process Yes visito Completed: Hospitalized since last visit: No Patient Requires Transmission-Based No Implantable device outside of the clinic excluding No Precautions: cellular tissue based products placed in the center Patient Has Alerts: No since last visit: Has Dressing in Place as Prescribed: Yes Has Compression in Place as Prescribed: Yes Pain Present Now: No Electronic Signature(s) Signed: 12/19/2019 6:28:18 PM By: Deon Pilling Entered By: Deon Pilling on 12/19/2019 07:58:49 -------------------------------------------------------------------------------- Compression Therapy Details Patient Name: Date of Service: [name], [name] 12/19/2019 7:30 AM Medical Record [id number] Patient Account Number: [id number] Date of Birth/Sex: Treating RN: 01-01-1976 (44 y.o. [name] Primary Care Apolonia Ellwood: [name], [name] Other Clinician: Referring Rhyse Loux: Treating Renata Gambino/Extender:[name], [name], [name] in Treatment: 34 Compression Therapy Performed for Wound Wound #13 Left,Proximal,Anterior Lower  Leg Assessment: Performed By: Clinician Deon Pilling, RN Compression Type: Four Layer Electronic Signature(s) Signed: 12/19/2019 6:28:18 PM By: Deon Pilling Entered By: Deon Pilling on 12/19/2019 07:59:28 -------------------------------------------------------------------------------- Compression Therapy Details Patient Name: Date of Service: [name], [name] 12/19/2019 7:30 AM Medical Record [id number] Patient Account Number: [id number] Date of Birth/Sex: Treating RN: 03/03/1976 (44 y.o. [name] Primary Care Jennelle Pinkstaff: [name], [name] Other Clinician: Referring Gerry Blanchfield: Treating Zakaria Fromer/Extender:[name], [name], [name] in Treatment: 34 Compression Therapy Performed for Wound Wound #9RR Left,Anterior Lower Leg Assessment: Performed By: Clinician Deon Pilling, RN Compression Type: Four Layer Electronic Signature(s) Signed: 12/19/2019 6:28:18 PM By: Deon Pilling Entered By: Deon Pilling on 12/19/2019 07:59:28 -------------------------------------------------------------------------------- Compression Therapy Details Patient Name: Date of Service: [name], [name] 12/19/2019 7:30 AM Medical Record [id number] Patient Account Number: [id number] Date of Birth/Sex: Treating RN: [birth year] (44 y.o. [name] Primary Care Josey Forcier: [name], [name] Other Clinician: Referring Avalene Sealy: Treating Adiah Guereca/Extender:[name], [name], [name] in Treatment: 34 Compression Therapy Performed for Wound Wound #16 Left,Medial Calcaneus Assessment: Performed By: Clinician Deon Pilling, RN Compression Type: Four Layer Electronic Signature(s) Signed: 12/19/2019 6:28:18 PM By: Deon Pilling Entered By: Deon Pilling on 12/19/2019 07:59:28 -------------------------------------------------------------------------------- Encounter Discharge Information Details Patient Name: Date of Service: [name], [name] 12/19/2019 7:30 AM Medical Record  [id number] Patient Account Number: [id number] Date of Birth/Sex: Treating RN: 05/14/1976 (44 y.o. [name] Primary Care Sadler Teschner: [name], [name] Other Clinician: Referring Damarea Merkel: Treating Nefertiti Mohamad/Extender:[name], [name], [name] in Treatment: 50 Encounter Discharge Information Items Discharge Condition: Stable Ambulatory Status: Ambulatory Discharge Destination: Home Transportation: Private Auto Accompanied By: self Schedule Follow-up Appointment: Yes Clinical Summary of Care: Electronic Signature(s) Signed: 12/19/2019 6:28:18 PM By: Deon Pilling Entered By: Deon Pilling on 12/19/2019 08:00:51 -------------------------------------------------------------------------------- Patient/Caregiver Education Details Patient Name: Date of Service: [name] 2/19/2021andnbsp7:30 AM Medical Record [id number] Patient Account Number: [id number] Date of Birth/Gender: 07-19-1976 (43 y.o. M) Treating RN: Deon Pilling Primary Care Physician: Lujean Amel Other Clinician: Referring Physician: Treating Physician/Extender:[name], [name], [name] in Treatment: 62 Education Assessment Education Provided To:  Patient Education Topics Provided Wound/Skin Impairment: Handouts: Skin Care Do's and Dont's Methods: Explain/Verbal Responses: Reinforcements needed Electronic Signature(s) Signed: 12/19/2019 6:28:18 PM By: Shawn Stall Entered By: Shawn Stall on 12/19/2019 08:00:42 -------------------------------------------------------------------------------- Wound Assessment Details Patient Name: Date of Service: [name], [name] 12/19/2019 7:30 AM Medical Record [id number] Patient Account Number: [id number] Date of Birth/Sex: Treating RN: 07/23/1976 (44 y.o. Tammy Sours Primary Care Kira Hartl: [name], [name] Other Clinician: Referring Lessie Manigo: Treating Jolynn Bajorek/Extender:[name], [name], [name] in Treatment: 34 Wound  Status Wound Number: 13 Primary Etiology: Venous Leg Ulcer Wound Location: Left, Proximal, Anterior Lower Leg Wound Status: Open Wounding Event: Gradually Appeared Date Acquired: 09/23/2019 Weeks Of Treatment: 11 Clustered Wound: No Wound Measurements Length: (cm) 0.1 % Reduct Width: (cm) 0.1 % Reduct Depth: (cm) 0.1 Area: (cm) 0.008 Volume: (cm) 0.001 Wound Description Classification: Full Thickness Without Exposed Support Structures ion in Area: 99.6% ion in Volume: 99.5% Treatment Notes Wound #13 (Left, Proximal, Anterior Lower Leg) 1. Cleanse With Wound Cleanser Soap and water 2. Periwound Care TCA Cream 3. Primary Dressing Applied Hydrogel or K-Y Jelly Other primary dressing (specifiy in notes) 4. Secondary Dressing ABD Pad 6. Support Layer Applied 4 layer compression wrap Notes primary dressing cutimed sorbact swab. netting. Electronic Signature(s) Signed: 12/19/2019 6:28:18 PM By: Shawn Stall Entered By: Shawn Stall on 12/19/2019 07:59:13 -------------------------------------------------------------------------------- Wound Assessment Details Patient Name: Date of Service: FELIZ, LINCOLN 12/19/2019 7:30 AM Medical Record [id number] Patient Account Number: [id number] Date of Birth/Sex: Treating RN: [birth year] (44 y.o. Tammy Sours Primary Care Jisella Ashenfelter: [name], [name] Other Clinician: Referring Nadja Lina: Treating Renold Kozar/Extender:[name], [name], [name] in Treatment: 34 Wound Status Wound Number: 16 Primary Etiology: Lymphedema Wound Location: Left, Medial Calcaneus Wound Status: Open Wounding Event: Gradually Appeared Date Acquired: 11/07/2019 Weeks Of Treatment: 6 Clustered Wound: No Wound Measurements Length: (cm) 0.1 % Reduct Width: (cm) 0.1 % Reduct Depth: (cm) 0.1 Area: (cm) 0.008 Volume: (cm) 0.001 Wound Description Full Thickness Without Exposed Support Classification: Structures ion in Area: 95.9% ion in  Volume: 95% Electronic Signature(s) Signed: 12/19/2019 6:28:18 PM By: Shawn Stall Entered By: Shawn Stall on 12/19/2019 07:59:13 -------------------------------------------------------------------------------- Wound Assessment Details Patient Name: Date of Service: TARAN, HAYNESWORTH 12/19/2019 7:30 AM Medical Record [id number] Patient Account Number: [id number] Date of Birth/Sex: Treating RN: Jun 18, 1976 (44 y.o. Tammy Sours Primary Care Brinn Westby: [name], [name] Other Clinician: Referring Bradie Sangiovanni: Treating Avilene Marrin/Extender:[name], [name], [name] in Treatment: 34 Wound Status Wound Number: [id number] Primary Etiology: Venous Leg Ulcer Wound Location: Left, Anterior Lower Leg Wound Status: Open Wounding Event: Gradually Appeared Date Acquired: 03/31/2019 Weeks Of Treatment: 34 Clustered Wound: No Wound Measurements Length: (cm) 1.5 % Reduct Width: (cm) 0.5 % Reduct Depth: (cm) 0.2 Area: (cm) 0.589 Volume: (cm) 0.118 Wound Description Full Thickness Without Exposed Support Classification: Structures ion in Area: 95.5% ion in Volume: 91.1% Treatment Notes Wound #9RR (Left, Anterior Lower Leg) 1. Cleanse With Wound Cleanser Soap and water 2. Periwound Care TCA Cream 3. Primary Dressing Applied Hydrogel or K-Y Jelly Other primary dressing (specifiy in notes) 4. Secondary Dressing ABD Pad 6. Support Layer Applied 4 layer compression wrap Notes primary dressing cutimed sorbact swab. netting. Electronic Signature(s) Signed: 12/19/2019 6:28:18 PM By: Shawn Stall Entered By: Shawn Stall on 12/19/2019 07:59:13 -------------------------------------------------------------------------------- Vitals Details Patient Name: Date of Service: STEFANOS, HAYNESWORTH 12/19/2019 7:30 AM Medical Record [id number] Patient Account Number: [id number] Date of Birth/Sex: Treating RN: 1976/10/06 (44 y.o. Tammy Sours Primary Care Johnnye Sandford: Darrow Bussing Other  Clinician: Referring Shanise Balch: Treating Deina Lipsey/Extender:[name], [name],  [name] in Treatment: 34 Vital Signs Time Taken: 07:40 Temperature (F): 98.2 Height (in): 74 Pulse (bpm): 89 Weight (lbs): 325 Respiratory Rate (breaths/min): 20 Body Mass Index (BMI): 41.7 Blood Pressure (mmHg): 150/86 Reference Range: 80 - 120 mg / dl Electronic Signature(s) Signed: 12/19/2019 6:28:18 PM By: Shawn Stall Entered By: Shawn Stall on 12/19/2019 07:59:03

## 2019-12-19 NOTE — Progress Notes (Signed)
COBIN, CADAVID ([id number]) Visit Report for 12/19/2019 SuperBill Details Patient Name: Date of Service: [name], [name] 12/19/2019 Medical Record [id number] Patient Account Number: [id number] Date of Birth/Sex: Treating RN: Mar 14, 1976 (44 y.o. 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

## 2019-12-26 ENCOUNTER — Encounter (HOSPITAL_BASED_OUTPATIENT_CLINIC_OR_DEPARTMENT_OTHER): Payer: 59 | Admitting: Internal Medicine

## 2019-12-26 ENCOUNTER — Other Ambulatory Visit: Payer: Self-pay

## 2019-12-26 DIAGNOSIS — L97422 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 layer exposed: Secondary | ICD-10-CM | POA: Diagnosis not present

## 2019-12-26 NOTE — Progress Notes (Signed)
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 time, patient states this is less painful than before, he is almost completed his doxycycline, the wound area is larger but the wound itself is less painful. 8/13-Left anterior leg wound looks smaller and overall making good progress the other wound has healed. We are using Hydrofera Blue 8/20; the patient has 2 open areas. The original inferior area and the new area superiorly. He has been using Hydrofera Blue ready but the wounds overall look dry. He has not managed to get a CT scan approved and hence has not seen Dr.  Trula Ore at [facility] 8/27 most of the patient's wound areas on the anterior look epithelialized. There is still some eschar and vulnerability here. He has  been wearing to press stockings which should give him 30/40 mmHg compression. He also has compression pumps that belonged to his father. I think it would be reasonable to try and get him external compression pumps. He was not using the compression pumps daily and these certainly need to be used daily. Finally he was approved for his CT scan on the [name] with follow-up with Dr. Trula Ore. We will asked Dr. Trula Ore to look at his left leg as well 9/3; the patient had 2 wound areas. Central tibial area of area is healed. Smaller wound superiorly still perhaps slightly open. I believe his CT scan on the [name] hip and pelvis areas on the 18th. That was ordered by Dr. Trula Ore. We have ordered him 30/40 stockings and are going to attempt to get him compression pumps 9/10-Patient returns at 1 week for the 2 small areas on his left anterior leg, these areas are healed. Patient is now going to going to the 30/40 stockings. His CT scan of his [name] leg hip and pelvis areas is scheduled 9/22 the patient was discharged from the clinic on 9/10. He tells me that he started developing pain on the left anterior tibial area and swelling on Thursday of last week. By Saturday the swelling was so prominent that he was scared to take his stocking off because he would be able to get it back on. There was increased pain. He was seen in the emergency department at [facility] on 9/1. 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 He was also felt to have cellulitis. There was apparently not any acute clot. He I he was put on a 3 time a day medication/antibiotic which I am assuming is clindamycin. He has 2 reopened areas both on the anterior  tibia 9/29; he completed the clindamycin even though it caused diarrhea. The diarrhea is resolved. The cellulitis in the left leg that was prominent last week has resolved. He also had a CT scan of the pelvis done which I think did not show anything on the [name] but did show obstruction of theo Common iliac vein on the left [he says in close proximity to the IVC]. I looked in [place] link I do not see the actual CT scan report. He is being apparently scheduled for an attempt at stenting retrograde and then if that does not work anterior grade by Dr. Trula Ore. We use silver alginate to the wound last week because of coexistent cellulitis 10/6; cellulitis on the left leg resolved. He is still waiting for insurance authorization for his pelvic vein stenting. Changed him to [facility] last week 10/13; cellulitis remains resolved. Wound is smaller. Using Hydrofera Blue under compression. In 2 days time he is going for his venous stenting hopefully by Dr. Trula Ore at [facility] 10/20; the patient went for his procedure last Thursday by Dr. Trula Ore although he does not remember in the postop period what he was told. He is not certain whether Dr. Trula Ore managed to get the stent then successfully. His wound is measuring smaller looks healthy on the left mid tibia. He is also having some discomfort behind his left knee that he had me look at which is the site where Dr. Trula Ore had venous access. I looked in care everywhere. I could not see a specific note from Dr. Trula Ore [[facility]] above the actual procedure. Presumably it is still in transfer  therefore I could not really answer his question about whether the stent was placed successful 10/27; wound not quite as good as last week. Using Hydrofera Blue. He thinks it might of stuck to the wound and was adherent when they took it off today. His procedure with Dr. Trula OreMarsden was not successful. He thinks that Dr. Trula OreMarsden will try to go at this from the other  direction at some point. He sees him in 2-1/2-week 11/3; quite an improvement in wound surface area this week. We are using Hydrofera Blue. No need to change the dressing. Follows up with Dr. Trula OreMarsden in about a week and a half 11/10; wound surface not as good this week at all. No changes in dimensions. We have been using Hydrofera Blue. As well he had tells us he had discomfort in his foot all week although he admits he was up on this more than usual. He comes in today with 2 small punched out areas on the left lateral foot. He has severe venous hypertension. He sees Dr. Trula OreMarsden again on 11/16 11/17; not much change in the area on the anterior tibia mid aspect. He also has 2 areas on the left lateral foot. We have been using Sorbact on the left anterior and Iodosorb ointment on the left foot. He is under 4-layer compression. The patient went to see Dr. Trula OreMarsden I do not yet have access to this note and care everywhere. However according to the patient there is an option to try and address his venous occlusion I think via a transjugular approach. This is complicated by the fact that the IVC filter is there and would have to be traversed. According to the patient there is about a 33% chance of perforation may be even aortic perforation. However they would be prepared for this. He is thinking about this and discussing it with his wife Thanks to our case manager this week we are able to determine if for some reason the patient is not using his compression pumps. He also is not able to get stockings on the [name] leg which are 30/40 below-knee. I think we may need to order external compression garments 12/1; patient arrives with the area on the left anterior mid tibia expanded superiorly small rim of skin between the original area and the new area. He has painful small denuded areas on the left medial and left lateral heel. We have been using Iodoflex. His next appointment with the Dr. Trula OreMarsden is January  4. after the holidays to discuss if he would like to pursue this endovascular option. Dr. Sherrin DaisyMarsdens notes below from last visit Subjective Lance SellMatthew [name] is a 44 y.o. 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 Unfortunately during the procedure a stent was not able to be placed d/t narrowing at the IVC. Per the patient the procedure provided 2-3 days of symptomatic relief (decrease in L LE pressure and pain) but after that his symptoms returned with increased pressure, pain in the hips and pain in the legs when standing. The patient does report his L LE wound is stable especially when he is consistently [place]. The patient denies using compressive LE therapies unless done at his wound clinic due to not being able to reach his legs. He is  interested in learning more and possibly pursing further interventional options for his L lower extremity via access through neck. *Objective Physical Exam: BP 171/86  Pulse 80  Temp 37 C (Temporal)  Wt (!) 147.4 kg (325 lb)  BMI 41.73 kg/m General: awake, alert and oriented x 3 Chest: Non labored breathing on room air CVS: Normal rate. Regular rhythm. ABD: soft, NT, ND Ext: bilateral leg edema w/ left leg in compressive dressing from wound clinic. Evidence of skin discoloration on both feet bilaterally. Data Review: Labs: None to review Imaging:Reviewed report of 10/15 L LE venogram. I saw and evaluated the patient, participating in the key portions of the service. I reviewed the residentoos note. I agree with the residentoos findings and plan. Electronically signed by [name] Skill, MD at 09/16/2019 10:53 AM EST 12/11 the areas around his ankles have healed. 2 areas in the  center part of the mid tibia area are still open we have been using silver collagen. Procedure attempt by Dr. Trula Ore at [facility] on 11/03/2019 12/18; the wounds around his ankles remain closed. I think there has been some improvement in the 2 areas in the center part of the mid tibia. Especially superiorly. Debris on the center required debridement we have been using silver collagen 12/31; I saw the patient briefly last week when he was here for a nurse visit. He had irritation in which look believed to be localized cellulitis and folliculitis on the lateral left calf. I gave him a prescription for cephalexin although he admits today he never filled it. The area still looked very angry. His original wounds we have been treating where a figure-of-eight shaped area on the anterior tibial area mid aspect. We have been using silver collagen these do not look healthy. 11/07/2019. The patient has completed his antibiotics. The area of folliculitis on the left lateral calf looks somewhat better although there is still erythema there is no tenderness. I am going to put some more compression on this area and will have a look at this next week. No additional antibiotics. Since the patient was last here he went to see Dr. Trula Ore at [facility]. I am able to see his note from 11/03/2019 in care everywhere. The patient has a history of a [name] iliac vein stent and IVC filter placement. He is status post angioplasty of the left common iliac vein on 08/14/2019 for chronic obstruction and venous insufficiency. Unfortunately a stent could not be placed during the procedure due to narrowing at the IVC from fibrotic change of the [name] iliac vein stent. As I understand things they are now going to approach this from both sides i.e. through an area in the internal jugular vein as well as the more standard distal approach and see if this area can be stented. The thought was 5 to 6 [name] before this could be arranged as it will  "require 2 surgeons, insurance approval etc. The original wound on his anterior tibia looks better. He has the area on the left lateral tibia with localized swelling. He has a new area on the left medial calcaneus which is small punched out and very painful. I think this looks similar to wounds he has had in this area before. He has severe venous hypertension 1/15; anterior tibia wound has come down from a figure-of-eight to 1 wound. Has some depth which looks clean. The area on the left lateral calf looks better. We put additional compression in here and the edema in this area looks better.  His major complaint is on the medial calcaneus. He had mentioned this last week and he has had wounds like this before. Pain is severe 1/22; anterior tibial wound seems to be closing at with shallower and better looking wound margin. He has an area anteriorly that I think was initially an area of folliculitis the area on the left lateral calf has closed over The small area on the medial heel that I cultured last week grew methicillin sensitive staph aureus but resistant to the doxycycline I gave him. I substituted cephalexin 500 every 6 and has only been on this for 2 days. Still says the pain is very significant 1/29; all the patient's wounds look quite good today including the anterior tibial which is closing in. The area above this also closing in finally the very painful area on the medial heel. He is completing antibiotics today this cultured methicillin sensitive staph aureus. Back to using silver alginate on the wounds 2/12; in general things are quite good here. He still does not have a date for attempting to stand his left common iliac vein. Still an insurance issue. I have been encouraged him to move on this by calling his insurance company. There is not a good option here. 2/26; continued nice improvement. He still has the open area in the mid tibia that is incompletely closed. Surprisingly today  the difficult area on his left medial calcaneus that was so painful and inflamed has epithelialized over. Objective Constitutional Patient is hypertensive.. Pulse regular and within target range for patient.Marland Kitchen. Respirations regular, non-labored and within target range.. Temperature is normal and within the target range for the patient.Marland Kitchen. Appears in no distress. Vitals Time Taken: 7:57 AM, Height: 74 in, Weight: 325 lbs, BMI: 41.7, Temperature: 98.8 F, Pulse: 91 bpm, Respiratory Rate: 20 breaths/min, Blood Pressure: 150/82 mmHg. General Notes: Wound exam; the area in the mid tibia had some eschar on the surface and circumference I elected not to debride this. The wound itself looks better. Just surprisingly the area that was so difficult on the left medial calcaneus is fully epithelialized. This is a nice development. ooSevere changes of chronic venous insufficiency his edema control is good but not great. I have asked him to use his compression pumps once a day on top of the compression Integumentary (Hair, Skin) Severe skin changes of chronic venous insufficiency. Wound #13 status is Healed - Epithelialized. Original cause of wound was Gradually Appeared. The wound is located on the Left,Proximal,Anterior Lower Leg. The wound measures 0cm length x 0cm width x 0cm depth; 0cm^2 area and 0cm^3 volume. Wound #16 status is Healed - Epithelialized. Original cause of wound was Gradually Appeared. The wound is located on the Left,Medial Calcaneus. The wound measures 0cm length x 0cm width x 0cm depth; 0cm^2 area and 0cm^3 volume. Wound #9RR status is Open. Original cause of wound was Gradually Appeared. The wound is located on the Left,Anterior Lower Leg. The wound measures 1.5cm length x 1.4cm width x 0.1cm depth; 1.649cm^2 area and 0.165cm^3 volume. There is no tunneling or undermining noted. There is a medium amount of serosanguineous drainage noted. The wound margin is distinct with the outline  attached to the wound base. There is no granulation within the wound bed. There is a small (1-33%) amount of necrotic tissue within the wound bed. Assessment Active Problems ICD-10 Non-pressure chronic ulcer of other part of left lower leg limited to breakdown of skin 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 layer exposed  Chronic venous hypertension (idiopathic) with inflammation of left lower extremity Non-pressure chronic ulcer of other part of left foot with other specified severity Lymphedema, not elsewhere classified Other thrombophilia Procedures Wound #9RR Pre-procedure diagnosis of Wound #9RR is a Venous Leg Ulcer located on the Left,Anterior Lower Leg . There was a Four Layer Compression Therapy Procedure by Shawn Stall, RN. Post procedure Diagnosis Wound #9RR: Same as Pre-Procedure Plan Follow-up Appointments: Return Appointment in 1 week. - Friday Dressing Change Frequency: Do not change entire dressing for one week. Skin Barriers/Peri-Wound Care: TCA Cream or Ointment - to left leg. Wound Cleansing: May shower with protection. Primary Wound Dressing: Wound #9RR Left,Anterior Lower Leg: Other: - cutimed sorbact swab with hydrogel. Secondary Dressing: Dry Gauze ABD pad - extra ABD pad to lateral leg for increased compression Edema Control: 4 layer compression: Left lower extremity - Add extra folded ABD pad to lateral leg for additional compression Avoid standing for long periods of time Elevate legs to the level of the heart or above for 30 minutes daily and/or when sitting, a frequency of: Support Garment 30-40 mm/Hg pressure to: - [name] leg : apply compression stockings dual layer in the am and remove at night. Segmental Compressive Device. - lymphedema pumps 1 hour 1 times per day. 1. Good improvement. 2. He tells [place] that the delay was with scheduling at [facility] and not particularly with his insurance. They are apparently moving forward on the  procedure which will be to stent his left common iliac vein. This will be unnecessary procedure going forward if this man is going to have any quality of life 3. I continued with Sorbact in the left anterior lower leg. 4. Continuing with 4-layer compression but I have asked him to use his pumps as well Electronic Signature(s) Signed: 12/26/2019 5:26:28 PM By: Baltazar Najjar MD Entered By: Baltazar Najjar on 12/26/2019 08:23:26 -------------------------------------------------------------------------------- SuperBill Details Patient Name: Date of Service: GENTRY, PILSON 12/26/2019 Medical Record [id number] Patient Account Number: [id number] Date of Birth/Sex: Treating RN: 1976/04/14 (44 y.o. [name] Primary Care Provider: Docia [name], [name] Other Clinician: Referring Provider: Treating Provider/Extender:Rannie Craney, [name], [name] in Treatment: 35 Diagnosis Coding ICD-10 Codes Code Description L97.821 Non-pressure chronic ulcer of other part of left lower leg limited to breakdown of skin L97.422 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 layer exposed I87.322 Chronic venous hypertension (idiopathic) with inflammation of left lower extremity L97.528 Non-pressure chronic ulcer of other part of left foot with other specified severity I89.0 Lymphedema, not elsewhere classified D68.69 Other thrombophilia Facility Procedures CPT4 Code Description: [id number] (Facility Use Only) [id number] - APPLY MULTLAY COMPRS LWR LT LEG Modifier: Quantity: 1 Physician Procedures CPT4 Code Description: [id number] - WC PHYS LEVEL 3 - EST PT ICD-10 Diagnosis Description L97.821 Non-pressure chronic ulcer of other part of left lower leg skin L97.528 Non-pressure chronic ulcer of other part of left foot with Modifier: limited to breakdo other specified se Quantity: 1 wn of verity Electronic Signature(s) Signed: 12/26/2019 5:26:28 PM By: Baltazar Najjar MD Entered By:  Baltazar Najjar on 12/26/2019 08:23:46

## 2020-01-02 ENCOUNTER — Encounter (HOSPITAL_BASED_OUTPATIENT_CLINIC_OR_DEPARTMENT_OTHER): Payer: 59 | Attending: Internal Medicine | Admitting: Internal Medicine

## 2020-01-02 ENCOUNTER — Other Ambulatory Visit: Payer: Self-pay

## 2020-01-02 DIAGNOSIS — M1990 Unspecified osteoarthritis, unspecified site: Secondary | ICD-10-CM | POA: Insufficient documentation

## 2020-01-02 DIAGNOSIS — L97521 Non-pressure chronic ulcer of other part of left foot limited to breakdown of skin: Secondary | ICD-10-CM | POA: Diagnosis not present

## 2020-01-02 DIAGNOSIS — L97528 Non-pressure chronic ulcer of other part of left foot with other specified severity: Secondary | ICD-10-CM | POA: Diagnosis not present

## 2020-01-02 DIAGNOSIS — L03116 Cellulitis of left lower limb: Secondary | ICD-10-CM | POA: Insufficient documentation

## 2020-01-02 DIAGNOSIS — G4730 Sleep apnea, unspecified: Secondary | ICD-10-CM | POA: Insufficient documentation

## 2020-01-02 DIAGNOSIS — L97422 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 layer exposed: Secondary | ICD-10-CM | POA: Diagnosis not present

## 2020-01-02 DIAGNOSIS — G629 Polyneuropathy, unspecified: Secondary | ICD-10-CM | POA: Diagnosis not present

## 2020-01-02 DIAGNOSIS — L97821 Non-pressure chronic ulcer of other part of left lower leg limited to breakdown of skin: Secondary | ICD-10-CM | POA: Insufficient documentation

## 2020-01-02 DIAGNOSIS — J45909 Unspecified asthma, uncomplicated: Secondary | ICD-10-CM | POA: Insufficient documentation

## 2020-01-02 DIAGNOSIS — Z86718 Personal history of other venous thrombosis and embolism: Secondary | ICD-10-CM | POA: Diagnosis not present

## 2020-01-02 DIAGNOSIS — I890 Lymphedema, not elsewhere classified: Secondary | ICD-10-CM | POA: Diagnosis not present

## 2020-01-02 DIAGNOSIS — D6869 Other thrombophilia: Secondary | ICD-10-CM | POA: Diagnosis not present

## 2020-01-02 NOTE — Progress Notes (Signed)
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 last time, patient states this is less painful than before, he is almost completed his doxycycline, the wound area is larger but the wound itself is less painful. 8/13-Left anterior leg wound looks smaller and overall making good progress the other wound has healed. We are using Hydrofera Blue 8/20; the patient has 2 open areas. The original inferior area and the new area superiorly. He has been using Hydrofera Blue ready but the wounds overall look dry. He has  not managed to get a CT scan approved and hence has not seen Dr. Trula Ore at  [facility] 8/27 most of the patient's wound areas on the anterior look epithelialized. There is still some eschar and vulnerability here. He has been wearing to press stockings which should give him 30/40 mmHg compression. He also has compression pumps that belonged to his father. I think it would be reasonable to try and get him external compression pumps. He was not using the compression pumps daily and these certainly need to be used daily. Finally he was approved for his CT scan on the right with follow-up with Dr. Gladys Damme. We will asked Dr. Gladys Damme to look at his left leg as well 9/3; the patient had 2 wound areas. Central tibial area of area is healed. Smaller wound superiorly still perhaps slightly open. I believe his CT scan on the right hip and pelvis areas on the 18th. That was ordered by Dr. Gladys Damme. We have ordered him 30/40 stockings and are going to attempt to get him compression pumps 9/10-Patient returns at 1 week for the 2 small areas on his left anterior leg, these areas are healed. Patient is now going to going to the 30/40 stockings. His CT scan of his right leg hip and pelvis areas is scheduled 9/22 the patient was discharged from the clinic on 9/10. He tells me that he started developing pain on the left anterior tibial area and swelling on Thursday of last week. By Saturday the swelling was so prominent that he was scared to take his stocking off because he would be able to get it back on. There was increased pain. He was seen in the emergency department at [facility] on 9/1. 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 He was also felt to have cellulitis. There was apparently not any acute clot. He I he was put on a 3 time a day medication/antibiotic which I am  assuming is clindamycin. He has 2 reopened areas both on the anterior tibia 9/29; he completed the clindamycin even though it caused diarrhea. The diarrhea is resolved. The cellulitis in the left leg that was prominent last week has resolved. 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 I looked in [place] link I do not see the actual CT scan report. He is being apparently scheduled for an attempt at stenting retrograde and then if that does not work anterior grade by Dr. Gladys Damme. We use silver alginate to the wound last week because of coexistent cellulitis 10/6; cellulitis on the left leg resolved. He is still waiting for insurance authorization for his pelvic vein stenting. Changed him to [facility] last week 10/13; cellulitis remains resolved. Wound is smaller. Using Hydrofera Blue under compression. In 2 days time he is going for his venous stenting hopefully by Dr. Gladys Damme at [facility] 10/20; the patient went for his procedure last Thursday by Dr. Gladys Damme although he does not remember in the postop period what he was told. He is not certain whether Dr. Gladys Damme managed to get the stent then successfully. His wound is measuring smaller looks healthy on the left mid tibia. He is also having some discomfort behind his left knee that he had me look at which is the site where Dr. Gladys Damme had venous access. I looked in  care everywhere. I could not see a specific note from Dr. Trula Ore [[facility]] above the actual procedure. Presumably it is still in transfer therefore I could not really answer his question about whether the stent was placed successful 10/27; wound not quite as good as last week. Using Hydrofera Blue. He thinks it might of stuck to the wound and was adherent when they took it off today. His procedure with Dr. Trula Ore was not successful. He  thinks that Dr. Trula Ore will try to go at this from the other direction at some point. He sees him in 2-1/2-week 11/3; quite an improvement in wound surface area this week. We are using Hydrofera Blue. No need to change the dressing. Follows up with Dr. Trula Ore in about a week and a half 11/10; wound surface not as good this week at all. No changes in dimensions. We have been using Hydrofera Blue. As well he had tells [place] he had discomfort in his foot all week although he admits he was up on this more than usual. He comes in today with 2 small punched out areas on the left lateral foot. He has severe venous hypertension. He sees Dr. Trula Ore again on 11/16 11/17; not much change in the area on the anterior tibia mid aspect. He also has 2 areas on the left lateral foot. We have been using Sorbact on the left anterior and Iodosorb ointment on the left foot. He is under 4-layer compression. The patient went to see Dr. Trula Ore I do not yet have access to this note and care everywhere. However according to the patient there is an option to try and address his venous occlusion I think via a transjugular approach. This is complicated by the fact that the IVC filter is there and would have to be traversed. According to the patient there is about a 33% chance of perforation may be even aortic perforation. However they would be prepared for this. He is thinking about this and discussing it with his wife Thanks to our case manager this week we are able to determine if for some reason the patient is not using his compression pumps. He also is not able to get stockings on the right leg which are 30/40 below-knee. I think we may need to order external compression garments 12/1; patient arrives with the area on the left anterior mid tibia expanded superiorly small rim of skin between the original area and the new area. He has painful small denuded areas on the left medial and left lateral heel. We have been using  Iodoflex. His next appointment with the Dr. Trula Ore is January 4. after the holidays to discuss if he would like to pursue this endovascular option. Dr. Sherrin Daisy notes below from last visit Subjective Maksim Peregoy is a 44 y.o. 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 Unfortunately during the procedure a stent was not able to be placed d/t narrowing at the IVC. Per the patient the procedure provided 2-3 days of symptomatic relief (decrease in L LE pressure and pain) but after that his symptoms returned with increased pressure, pain in the hips and pain in the legs when standing. The patient does report his L LE wound is stable especially when he is consistently attending his wound care clinic in [place]. The  patient denies using compressive LE therapies unless done at his wound clinic due to not being able to reach his legs. He is interested in learning more and possibly pursing further interventional options for his L lower extremity via access through neck. *Objective Physical Exam: BP 171/86  Pulse 80  Temp 37 C (Temporal)  Wt (!) 147.4 kg (325 lb)  BMI 41.73 kg/m General: awake, alert and oriented x 3 Chest: Non labored breathing on room air CVS: Normal rate. Regular rhythm. ABD: soft, NT, ND Ext: bilateral leg edema w/ left leg in compressive dressing from wound clinic. Evidence of skin discoloration on both feet bilaterally. Data Review: Labs: None to review Imaging:Reviewed report of 10/15 L LE venogram. I saw and evaluated the patient, participating in the key portions of the service. I reviewed the residentoos note. I agree with the residentoos findings and plan. Electronically signed by Derry Skill, MD at 09/16/2019 10:53 AM  EST 12/11 the areas around his ankles have healed. 2 areas in the center part of the mid tibia area are still open we have been using silver collagen. Procedure attempt by Dr. Trula Ore at [facility] on 11/03/2019 12/18; the wounds around his ankles remain closed. I think there has been some improvement in the 2 areas in the center part of the mid tibia. Especially superiorly. Debris on the center required debridement we have been using silver collagen 12/31; I saw the patient briefly last week when he was here for a nurse visit. He had irritation in which look believed to be localized cellulitis and folliculitis on the lateral left calf. I gave him a prescription for cephalexin although he admits today he never filled it. The area still looked very angry. His original wounds we have been treating where a figure-of-eight shaped area on the anterior tibial area mid aspect. We have been using silver collagen these do not look healthy. 11/07/2019. The patient has completed his antibiotics. The area of folliculitis on the left lateral calf looks somewhat better although there is still erythema there is no tenderness. I am going to put some more compression on this area and will have a look at this next week. No additional antibiotics. Since the patient was last here he went to see Dr. Trula Ore at [facility]. I am able to see his note from 11/03/2019 in care everywhere. The patient has a history of a right iliac vein stent and IVC filter placement. He is status post angioplasty of the left common iliac vein on 08/14/2019 for chronic obstruction and venous insufficiency. Unfortunately a stent could not be placed during the procedure due to narrowing at the IVC from fibrotic change of the right iliac vein stent. As I understand things they are now going to approach this from both sides i.e. through an area in the internal jugular vein as well as the more standard distal approach and see if this area can be stented. The  thought was 5 to 6 weeks before this could be arranged as it will "require 2 surgeons, insurance approval etc. The original wound on his anterior tibia looks better. He has the area on the left lateral tibia with localized swelling. He has a new area on the left medial calcaneus which is small punched out and very painful. I think this looks similar to wounds he has had in this area before. He has severe venous hypertension 1/15; anterior tibia wound has come down from a figure-of-eight to 1 wound. Has some depth which looks clean.  The area on the left lateral calf looks better. We put additional compression in here and the edema in this area looks better. His major complaint is on the medial calcaneus. He had mentioned this last week and he has had wounds like this before. Pain is severe 1/22; anterior tibial wound seems to be closing at with shallower and better looking wound margin. He has an area anteriorly that I think was initially an area of folliculitis the area on the left lateral calf has closed over The small area on the medial heel that I cultured last week grew methicillin sensitive staph aureus but resistant to the doxycycline I gave him. I substituted cephalexin 500 every 6 and has only been on this for 2 days. Still says the pain is very significant 1/29; all the patient's wounds look quite good today including the anterior tibial which is closing in. The area above this also closing in finally the very painful area on the medial heel. He is completing antibiotics today this cultured methicillin sensitive staph aureus. Back to using silver alginate on the wounds 2/12; in general things are quite good here. He still does not have a date for attempting to stand his left common iliac vein. Still an insurance issue. I have been encouraged him to move on this by calling his insurance company. There is not a good option here. 2/26; continued nice improvement. He still has the open area  in the mid tibia that is incompletely closed. Surprisingly today the difficult area on his left medial calcaneus that was so painful and inflamed has epithelialized over. 3/5; the patient has his stenting procedure attempt at [facility] on 3/25. He continues to make nice progress the only remaining wound is on the left anterior tibial area. His heel medially remains epithelialized. We have been using Sorbac Objective Constitutional Patient is hypertensive.. Pulse regular and within target range for patient.Marland Kitchen Respirations regular, non-labored and within target range.. Temperature is normal and within the target range for the patient.Marland Kitchen Appears in no distress. Vitals Time Taken: 8:12 AM, Height: 74 in, Weight: 325 lbs, BMI: 41.7, Temperature: 98.7 F, Pulse: 88 bpm, Respiratory Rate: 18 breaths/min, Blood Pressure: 147/73 mmHg. Cardiovascular Needle pulses are palpable. General Notes: Wound exam; the area in question is on the left mid tibia. He has some debris on the circumference as usual which we washed off with Anasept and gauze he has a crescent-shaped wound remaining. Surface does not look too bad although will need to have a careful look at that next time especially if dimensions do not change. He does not have an arterial issue. His edema seems under better control. No evidence of concurrent infection which has been problematic in this case Integumentary (Hair, Skin) Wound #9RR status is Open. Original cause of wound was Gradually Appeared. The wound is located on the Left,Anterior Lower Leg. The wound measures 0.6cm length x 1cm width x 0.1cm depth; 0.471cm^2 area and 0.047cm^3 volume. There is Fat Layer (Subcutaneous Tissue) Exposed exposed. There is no tunneling or undermining noted. There is a medium amount of serosanguineous drainage noted. The wound margin is distinct with the outline attached to the wound base. There is large (67-100%) red granulation within the wound bed. There is a  small (1-33%) amount of necrotic tissue within the wound bed including Adherent Slough. Assessment Active Problems ICD-10 Non-pressure chronic ulcer of other part of left lower leg limited to breakdown of skin Chronic venous hypertension (idiopathic) with inflammation of left lower extremity Lymphedema,  not elsewhere classified Other thrombophilia Procedures Wound #9RR Pre-procedure diagnosis of Wound #9RR is a Venous Leg Ulcer located on the Left,Anterior Lower Leg . There was a Four Layer Compression Therapy Procedure by [name], RN. Post procedure Diagnosis Wound #9RR: Same as Pre-Procedure Plan Follow-up Appointments: Return Appointment in 1 week. - Friday Dressing Change Frequency: Do not change entire dressing for one week. Skin Barriers/Peri-Wound Care: TCA Cream or Ointment - to left leg. Wound Cleansing: May shower with protection. Primary Wound Dressing: Wound #9RR Left,Anterior Lower Leg: Other: - cutimed sorbact swab with hydrogel. Secondary Dressing: Dry Gauze ABD pad Edema Control: 4 layer compression: Left lower extremity - may use coflex if available Avoid standing for long periods of time Elevate legs to the level of the heart or above for 30 minutes daily and/or when sitting, a frequency of: Support Garment 30-40 mm/Hg pressure to: - right leg : apply compression stockings dual layer in the am and remove at night. Segmental Compressive Device. - lymphedema pumps 1 hour 1 times per day. 1. I am continuing Sorbact under 4-layer compression which he tolerates well 2. He says he uses his compression pumps once a day-told him I do not think anything will get him away from continuing this at least once a day even successful placement of a venous stent Electronic Signature(s) Signed: 01/02/2020 5:21:33 PM By: Baltazar Najjar MD Entered By: Baltazar Najjar on 01/02/2020  08:33:24 -------------------------------------------------------------------------------- SuperBill Details Patient Name: Date of Service: SHYLER, HOLZMAN 01/02/2020 Medical Record [id number] Patient Account Number: [id number] Date of Birth/Sex: Treating RN: May 21, 1976 (44 y.o. [name] Primary Care Provider: [name], [name] Other Clinician: Referring Provider: Treating Provider/Extender:Ingeborg Fite, [name], [name] in Treatment: 36 Diagnosis Coding ICD-10 Codes Code Description L97.821 Non-pressure chronic ulcer of other part of left lower leg limited to breakdown of skin L97.422 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 layer exposed I87.322 Chronic venous hypertension (idiopathic) with inflammation of left lower extremity L97.528 Non-pressure chronic ulcer of other part of left foot with other specified severity I89.0 Lymphedema, not elsewhere classified D68.69 Other thrombophilia Facility Procedures CPT4 Code Description: [id number] (Facility Use Only) [phone] - APPLY MULTLAY COMPRS LWR LT LEG Modifier: Quantity: 1 Physician Procedures CPT4 Code Description: [id number] - WC PHYS LEVEL 3 - EST PT ICD-10 Diagnosis Description L97.821 Non-pressure chronic ulcer of other part of left lower l skin I87.322 Chronic venous hypertension (idiopathic) with inflammati I89.0 Lymphedema, not  elsewhere classified Modifier: eg limited to b on of left lowe Quantity: 1 reakdown of r extremity Electronic Signature(s) Signed: 01/02/2020 5:21:33 PM By: Baltazar Najjar MD Entered By: Baltazar Najjar on 01/02/2020 08:33:44

## 2020-01-07 NOTE — Progress Notes (Signed)
DRAYCEN, LEICHTER ([id number]) Visit Report for 01/02/2020 Arrival Information Details Patient Name: Date of Service: [name], [name] 01/02/2020 7:30 AM Medical Record [id number] Patient Account Number: [id number] Date of Birth/Sex: Treating RN: April 19, 1976 (43 y.o. [name]) Carlene Coria Primary Care Ardel Jagger: [name], [name] Other Clinician: Referring Yarden Manuelito: Treating Allure Greaser/Extender:[name], [name], [name] in Treatment: 84 Visit Information History Since Last Visit All ordered tests and consults were completed: No Patient Arrived: Ambulatory Added or deleted any medications: No Arrival Time: 08:11 Any new allergies or adverse reactions: No Accompanied By: self Had a fall or experienced change in No Transfer Assistance: None activities of daily living that may affect Patient Identification Verified: Yes risk of falls: Secondary Verification Process Completed: Yes Signs or symptoms of abuse/neglect since last No Patient Requires Transmission-Based No visito Precautions: Hospitalized since last visit: No Patient Has Alerts: No Implantable device outside of the clinic excluding No cellular tissue based products placed in the center since last visit: Has Dressing in Place as Prescribed: Yes Has Compression in Place as Prescribed: Yes Pain Present Now: Yes Electronic Signature(s) Signed: 01/07/2020 7:20:02 AM By: Carlene Coria RN Entered By: Carlene Coria on 01/02/2020 08:12:24 -------------------------------------------------------------------------------- Compression Therapy Details Patient Name: Date of Service: [name], [name] 01/02/2020 7:30 AM Medical Record [id number] Patient Account Number: [id number] Date of Birth/Sex: Treating RN: 1975/11/05 (44 y.o. [name] Primary Care Johney Perotti: [name], [name] Other Clinician: Referring Eyana Stolze: Treating Tymar Polyak/Extender:[name], [name], [name] in Treatment: 36 Compression Therapy Performed for  Wound Wound #9RR Left,Anterior Lower Leg Assessment: Performed By: Clinician Carlene Coria, RN Compression Type: Four Layer Post Procedure Diagnosis Same as Pre-procedure Electronic Signature(s) Signed: 01/02/2020 5:33:53 PM By: Baruch Gouty RN, BSN Entered By: Baruch Gouty on 01/02/2020 08:27:50 -------------------------------------------------------------------------------- Encounter Discharge Information Details Patient Name: Date of Service: [name], [name] 01/02/2020 7:30 AM Medical Record [id number] Patient Account Number: [id number] Date of Birth/Sex: Treating RN: 1976/04/02 (44 y.o. [name] Primary Care Arlo Butt: [name], [name] Other Clinician: Referring Lorrine Killilea: Treating Jerriah Ines/Extender:[name], [name], [name] in Treatment: 3 Encounter Discharge Information Items Discharge Condition: Stable Ambulatory Status: Ambulatory Discharge Destination: Home Transportation: Private Auto Accompanied By: self Schedule Follow-up Appointment: Yes Clinical Summary of Care: Electronic Signature(s) Signed: 01/02/2020 5:34:19 PM By: Deon Pilling Entered By: Deon Pilling on 01/02/2020 09:13:58 -------------------------------------------------------------------------------- Lower Extremity Assessment Details Patient Name: Date of Service: [name], [name] 01/02/2020 7:30 AM Medical Record [id number] Patient Account Number: [id number] Date of Birth/Sex: Treating RN: 1975-11-15 (43 y.o. [name]) Carlene Coria Primary Care Seidy Labreck: [name], [name] Other Clinician: Referring Kimbrely Buckel: Treating Salimata Christenson/Extender:[name], [name], [name] in Treatment: 36 Edema Assessment Assessed: [Left: No] [Right: No] Edema: [Left: Ye] [Right: s] Calf Left: Right: Point of Measurement: 35 cm From Medial Instep 43 cm cm Ankle Left: Right: Point of Measurement: 11.5 cm From Medial Instep 24 cm cm Electronic Signature(s) Signed: 01/07/2020 7:20:02 AM By: Carlene Coria RN Entered By: Carlene Coria on 01/02/2020 08:14:43 -------------------------------------------------------------------------------- Multi Wound Chart Details Patient Name: Date of Service: [name], [name] 01/02/2020 7:30 AM Medical Record [id number] Patient Account Number: [id number] Date of Birth/Sex: Treating RN: [birth year] (44 y.o. [name] Primary Care Yanice Maqueda: [name], [name] Other Clinician: Referring Heath Tesler: Treating Euphemia Lingerfelt/Extender:[name], [name], [name] in Treatment: 36 Vital Signs Height(in): 74 Pulse(bpm): 88 Weight(lbs): 325 Blood Pressure(mmHg): 147/73 Body Mass Index(BMI): 42 Temperature(F): 98.7 Respiratory 18 Rate(breaths/min): Photos: [9RR:No Photos] [N/A:N/A] Wound Location: [9RR:Left Lower Leg - Anterior] [N/A:N/A] Wounding Event: [9RR:Gradually Appeared] [N/A:N/A] Primary Etiology: [9RR:Venous Leg Ulcer] [N/A:N/A] Comorbid History: [9RR:Asthma, Sleep Apnea, DeepN/A Vein Thrombosis,  Phlebitis, Osteoarthritis, Neuropathy] Date Acquired: [9RR:03/31/2019] [N/A:N/A] Weeks of Treatment: [9RR:36] [N/A:N/A] Wound Status: [9RR:Open] [N/A:N/A] Wound Recurrence: [9RR:Yes] [N/A:N/A] Measurements L x W x D 0.6x1x0.1 [N/A:N/A] (cm) Area (cm) : [9RR:0.471] [N/A:N/A] Volume (cm) : [9RR:0.047] [N/A:N/A] % Reduction in Area: [9RR:96.40%] [N/A:N/A] % Reduction in Volume: 96.40% [N/A:N/A] Classification: [9RR:Full Thickness Without Exposed Support Structures] [N/A:N/A] Exudate Amount: [9RR:Medium] [N/A:N/A] Exudate Type: [9RR:Serosanguineous] [N/A:N/A] Exudate Color: [9RR:red, brown] [N/A:N/A] Wound Margin: [9RR:Distinct, outline attached] [N/A:N/A] Granulation Amount: [9RR:Large (67-100%)] [N/A:N/A] Granulation Quality: [9RR:Red] [N/A:N/A] Necrotic Amount: [9RR:Small (1-33%)] [N/A:N/A] Exposed Structures: [9RR:Fat Layer (Subcutaneous Tissue) Exposed: Yes Fascia: No Tendon: No Muscle: No Joint: No Bone: No]  [N/A:N/A] Epithelialization: [9RR:Medium (34-66%) Compression Therapy] [N/A:N/A N/A] Treatment Notes Electronic Signature(s) Signed: 01/02/2020 5:21:33 PM By: Linton Ham MD Signed: 01/05/2020 5:31:36 PM By: Kela Millin Entered By: Linton Ham on 01/02/2020 08:31:00 -------------------------------------------------------------------------------- Multi-Disciplinary Care Plan Details Patient Name: Date of Service: ERIS, BRECK 01/02/2020 7:30 AM Medical Record [id number] Patient Account Number: [id number] Date of Birth/Sex: Treating RN: [birth year] (44 y.o. [name] Primary Care Ciarra Braddy: [name], [name] Other Clinician: Referring Neziah Braley: Treating Zaryiah Barz/Extender:[name], [name], [name] in Treatment: 36 Active Inactive Venous Leg Ulcer Nursing Diagnoses: Actual venous Insuffiency (use after diagnosis is confirmed) Knowledge deficit related to disease process and management Goals: Patient will maintain optimal edema control Date Initiated: 11/14/2019 Target Resolution Date: 01/09/2020 Goal Status: Active Interventions: Assess peripheral edema status every visit. Compression as ordered Treatment Activities: Therapeutic compression applied : 11/14/2019 Notes: Wound/Skin Impairment Nursing Diagnoses: Knowledge deficit related to ulceration/compromised skin integrity Goals: Patient/caregiver will verbalize understanding of skin care regimen Date Initiated: 07/22/2019 Target Resolution Date: 01/09/2020 Goal Status: Active Ulcer/skin breakdown will have a volume reduction of 30% by week 4 Date Inactivated: 08/26/2019 Target Resolution Date Initiated: 07/22/2019 Date: 08/22/2019 Goal Status: Met Ulcer/skin breakdown will have a volume reduction of 50% by week 8 Target Resolution Date Initiated: 08/26/2019 Date Inactivated: 09/30/2019 Date: 09/19/2019 Goal Status: Unmet Unmet Reason: comorbities Ulcer/skin breakdown will have a volume  reduction of 80% by week 12 Date Initiated: 09/30/2019 Target Resolution Date: 01/09/2020 Goal Status: Active Interventions: Assess patient/caregiver ability to obtain necessary supplies Assess patient/caregiver ability to perform ulcer/skin care regimen upon admission and as needed Assess ulceration(s) every visit Notes: Electronic Signature(s) Signed: 01/02/2020 5:33:53 PM By: Baruch Gouty RN, BSN Entered By: Baruch Gouty on 01/02/2020 08:26:03 -------------------------------------------------------------------------------- Pain Assessment Details Patient Name: Date of Service: OAKLEN, THIAM 01/02/2020 7:30 AM Medical Record [id number] Patient Account Number: [id number] Date of Birth/Sex: Treating RN: 13-May-1976 (43 y.o. Oval Linsey Primary Care Kimela Malstrom: [name], [name] Other Clinician: Referring Jezabella Schriever: Treating Shyrl Obi/Extender:[name], [name], [name] in Treatment: 36 Active Problems Location of Pain Severity and Description of Pain Patient Has Paino Yes Site Locations With Dressing Change: Yes With Dressing Change: Yes Duration of the Pain. Constant / Intermittento Constant Rate the pain. Current Pain Level: 2 Worst Pain Level: 2 Least Pain Level: 1 Character of Pain Describe the Pain: Aching Pain Management and Medication Current Pain Management: Medication: Yes Cold Application: No Rest: Yes Massage: No Activity: No T.E.N.S.: No Heat Application: No Leg drop or elevation: No Is the Current Pain Management Adequate: Inadequate How does your wound impact your activities of daily livingo Sleep: No Bathing: No Appetite: No Relationship With Others: No Bladder Continence: No Emotions: No Bowel Continence: No Work: No Toileting: No Drive: No Dressing: No Hobbies: No Electronic Signature(s) Signed: 01/07/2020 7:20:02 AM By: Carlene Coria RN Entered By: Carlene Coria on 01/02/2020  08:14:21 --------------------------------------------------------------------------------  Patient/Caregiver Education Details Patient Name: Date of Service: SCOTTY, WEIGELT 3/5/2021andnbsp7:30 AM Medical Record [phone] Patient Account Number: [id number] Date of Birth/Gender: Sep 20, 1976 (44 y.o. M) Treating RN: Baruch Gouty Primary Care Physician: [name], [name] Other Clinician: Referring Physician: Treating Physician/Extender:[name], [name], [name] in Treatment: 70 Education Assessment Education Provided To: Patient Education Topics Provided Venous: Methods: Explain/Verbal Responses: Reinforcements needed, State content correctly Wound/Skin Impairment: Methods: Explain/Verbal Responses: Reinforcements needed, State content correctly Electronic Signature(s) Signed: 01/02/2020 5:33:53 PM By: Baruch Gouty RN, BSN Entered By: Baruch Gouty on 01/02/2020 41:93:79 -------------------------------------------------------------------------------- Wound Assessment Details Patient Name: Date of Service: LON, KLIPPEL 01/02/2020 7:30 AM Medical Record [id number] Patient Account Number: [id number] Date of Birth/Sex: Treating RN: 1976-10-09 (43 y.o. [name]) Carlene Coria Primary Care Rakesha Dalporto: [name], [name] Other Clinician: Referring Jonathon Castelo: Treating Marlicia Sroka/Extender:[name], [name], [name] in Treatment: 36 Wound Status Wound Number: [id number] Primary Venous Leg Ulcer Etiology: Wound Location: Left Lower Leg - Anterior Wound Open Wounding Event: Gradually Appeared Status: Date Acquired: 03/31/2019 Comorbid Asthma, Sleep Apnea, Deep Vein Thrombosis, Weeks Of Treatment: 36 History: Phlebitis, Osteoarthritis, Neuropathy Clustered Wound: No Wound Measurements Length: (cm) 0.6 % Reduct Width: (cm) 1 % Reduct Depth: (cm) 0.1 Epitheli Area: (cm) 0.471 Tunneli Volume: (cm) 0.047 Undermi Wound Description Classification: Full Thickness Without  Exposed Support Foul Od Structures Slough/ Wound Distinct, outline attached Margin: Exudate Medium Amount: Exudate Serosanguineous Type: Exudate red, brown Color: Wound Bed Granulation Amount: Large (67-100%) Granulation Quality: Red Fascia Necrotic Amount: Small (1-33%) Fat Layer Necrotic Quality: Adherent Slough Tendon Ex Muscle Ex Joint Exp Bone Expo or After Cleansing: No Fibrino Yes Exposed Structure Exposed: No (Subcutaneous Tissue) Exposed: Yes posed: No posed: No osed: No sed: No ion in Area: 96.4% ion in Volume: 96.4% alization: Medium (34-66%) ng: No ning: No Treatment Notes Wound #9RR (Left, Anterior Lower Leg) 1. Cleanse With Wound Cleanser Soap and water 2. Periwound Care TCA Cream 3. Primary Dressing Applied Hydrogel or K-Y Jelly Other primary dressing (specifiy in notes) 4. Secondary Dressing ABD Pad 6. Support Layer Applied 4 layer compression wrap Notes primary dressing cutimed sorbact swab. netting. Electronic Signature(s) Signed: 01/07/2020 7:20:02 AM By: Carlene Coria RN Entered By: Carlene Coria on 01/02/2020 08:15:42 -------------------------------------------------------------------------------- Vitals Details Patient Name: Date of Service: BRUCE, CHURILLA 01/02/2020 7:30 AM Medical Record [id number] Patient Account Number: [id number] Date of Birth/Sex: Treating RN: 1976/09/12 (43 y.o. [name]) Carlene Coria Primary Care Delainie Chavana: [name], [name] Other Clinician: Referring Saabir Blyth: Treating Carlos Quackenbush/Extender:[name], [name], [name] in Treatment: 36 Vital Signs Time Taken: 08:12 Temperature (F): 98.7 Height (in): 74 Pulse (bpm): 88 Weight (lbs): 325 Respiratory Rate (breaths/min): 18 Body Mass Index (BMI): 41.7 Blood Pressure (mmHg): 147/73 Reference Range: 80 - 120 mg / dl Electronic Signature(s) Signed: 01/07/2020 7:20:02 AM By: Carlene Coria RN Entered By: Carlene Coria on 01/02/2020 08:12:58

## 2020-01-09 ENCOUNTER — Encounter (HOSPITAL_BASED_OUTPATIENT_CLINIC_OR_DEPARTMENT_OTHER): Payer: 59 | Admitting: Internal Medicine

## 2020-01-13 ENCOUNTER — Encounter (HOSPITAL_BASED_OUTPATIENT_CLINIC_OR_DEPARTMENT_OTHER): Payer: 59 | Admitting: Internal Medicine

## 2020-01-16 ENCOUNTER — Other Ambulatory Visit: Payer: Self-pay

## 2020-01-16 ENCOUNTER — Encounter (HOSPITAL_BASED_OUTPATIENT_CLINIC_OR_DEPARTMENT_OTHER): Payer: 59 | Admitting: Internal Medicine

## 2020-01-16 DIAGNOSIS — L97821 Non-pressure chronic ulcer of other part of left lower leg limited to breakdown of skin: Secondary | ICD-10-CM | POA: Diagnosis not present

## 2020-01-19 ENCOUNTER — Encounter (HOSPITAL_BASED_OUTPATIENT_CLINIC_OR_DEPARTMENT_OTHER): Payer: 59 | Admitting: Internal Medicine

## 2020-01-19 ENCOUNTER — Other Ambulatory Visit: Payer: Self-pay

## 2020-01-19 DIAGNOSIS — L97821 Non-pressure chronic ulcer of other part of left lower leg limited to breakdown of skin: Secondary | ICD-10-CM | POA: Diagnosis not present

## 2020-01-19 NOTE — Progress Notes (Signed)
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 somewhat surprising adherent black necrotic surface. Not even really eschared. No evidence of infection. We did silver alginate last week 7/9; left mid tibia somewhat improved surface and slightly smaller. Using Iodoflex. 7/16 left mid tibia. Continues to be slightly smaller with improved surface using Iodoflex. He did not see Dr. Trula Ore with regards to the CT scan on the right leg because his  insurance did not approve it 7/23; left mid tibia. No change in surface area however the wound surface looks better. He has another small area superiorly  which is a small hyper granulated lesion. But this is of unclear etiology however it is defined is new this week. He also had significant bleeding reported by our intake nurse the exact source of this was unclear. 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 His original wound looks quite a bit better and how it every he is developed over the last 3 weeks a raised painful area just above the wound. I am not sure if this represents a primary cutaneous issue or a venous issue. He seems to have a palpable vein underneath this which is tender may represent superficial venous thrombosis. He is already on Xarelto. 8/6-Patient returns at 1 week for his left mid tibial wounds, the more proximal of the 2 wounds was raised painful area described last time, patient states this is less painful than before, he is almost completed his doxycycline, the wound area is larger but the wound itself is less painful. 8/13-Left anterior leg wound looks smaller and overall making good progress the other wound has healed. We are using Hydrofera Blue 8/20; the patient has 2 open areas. The original inferior area and the new area superiorly. He has been using Hydrofera Blue ready but the wounds overall look dry. He has not managed to get a CT scan approved and hence has not seen Dr. Trula Ore at [facility] 8/27 most of the patient's wound areas on the anterior look epithelialized. There is still some eschar and vulnerability here. He has been wearing to press stockings which should give him 30/40 mmHg compression. He also has compression pumps that belonged to his father. I think it would be reasonable to try and get him external compression pumps. He was not using the compression pumps daily and these  certainly need to be used daily. Finally he was approved for his CT scan on the right with follow-up with Dr. Trula Ore. We will asked Dr. Trula Ore to look at his left leg as well 9/3; the patient had 2 wound areas. Central tibial area of area is healed. Smaller wound superiorly still perhaps slightly open. I believe his CT scan on the right hip and pelvis areas on the 18th. That was ordered by Dr. Trula Ore. We have ordered him 30/40 stockings and are going to attempt to get him compression pumps 9/10-Patient returns at 1 week for the 2 small areas on his left anterior leg, these areas are healed. Patient is now going to going to the 30/40 stockings. His CT scan of his right leg hip and pelvis areas is scheduled 9/22 the patient was discharged from the clinic on 9/10. He tells me that he started developing pain on the left anterior tibial area and swelling on Thursday of last week. By Saturday the swelling was so prominent that he was scared to take his stocking off because he would be able to get it back on. There was increased pain. He was seen in the emergency department at [facility] on 9/1. 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 He was also felt to have cellulitis. There was apparently not any acute clot. He I he was put on a 3 time a day medication/antibiotic which I am assuming is clindamycin. He has 2 reopened areas both on the anterior tibia 9/29; he completed the clindamycin even though it caused diarrhea. The diarrhea  is resolved. The cellulitis in the left leg that was prominent last week has resolved. 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 I looked in [place] link I do not see the actual CT  scan report. He is being apparently scheduled for an attempt at stenting retrograde and then if that does not work anterior grade by Dr. Trula Ore. We use silver alginate to the wound last week because of coexistent cellulitis 10/6; cellulitis on the left leg resolved. He is still waiting for insurance authorization for his pelvic vein stenting. Changed him to [facility] last week 10/13; cellulitis remains resolved. Wound is smaller. Using Hydrofera Blue under compression. In 2 days time he is going for his venous stenting hopefully by Dr. Trula Ore at [facility] 10/20; the patient went for his procedure last Thursday by Dr. Trula Ore although he does not remember in the postop period what he was told. He is not certain whether Dr. Trula Ore managed to get the stent then successfully. His wound is measuring smaller looks healthy on the left mid tibia. He is also having some discomfort behind his left knee that he had me look at which is the site where Dr. Trula Ore had venous access. I looked in care everywhere. I could not see a specific note from Dr. Trula Ore [[facility]] above the actual procedure. Presumably it is still in transfer therefore I could not really answer his question about whether the stent was placed successful 10/27; wound not quite as good as last week. Using Hydrofera Blue. He thinks it might of stuck to the wound and was adherent when they took it off today. His procedure with Dr. Trula Ore was not successful. He thinks that Dr. Trula Ore will try to go at this from the other direction at some point. He sees him in 2-1/2-week 11/3; quite an improvement in wound surface area this week. We are using Hydrofera Blue. No need to change the dressing. Follows up with Dr. Trula Ore in about a week and a half 11/10; wound surface not as good this week at all. No changes in dimensions. We have been using Hydrofera Blue. As well he had tells [place] he had discomfort in his foot all week although he admits he was up  on this more than usual. He comes in today with 2 small punched out areas on the left lateral foot. He has severe venous hypertension. He sees Dr. Trula Ore again on 11/16 11/17; not much change in the area on the anterior tibia mid aspect. He also has 2 areas on the left lateral foot. We have been using Sorbact on the left anterior and Iodosorb ointment on the left foot. He is under 4-layer compression. The patient went to see Dr. Trula Ore I do not yet have access to this note and care everywhere. However according to the patient there is an option to try and address his venous occlusion I think via a transjugular approach. This is complicated by the fact that the IVC filter is there and would have to be traversed. According to the patient there is about a 33% chance of perforation may be even aortic perforation. However they would be prepared for this. He is thinking about this and discussing it with his wife Thanks to our case manager this week we are able to determine if for some  reason the patient is not using his compression pumps. He also is not able to get stockings on the right leg which are 30/40 below-knee. I think we may need to order external compression garments 12/1; patient arrives with the area on the left anterior mid tibia expanded superiorly small rim of skin between the original area and the new area. He has painful small denuded areas on the left medial and left lateral heel. We have been using Iodoflex. His next appointment with the Dr. Trula Ore is January 4. after the holidays to discuss if he would like to pursue this endovascular option. Dr. Sherrin Daisy notes below from last visit Subjective Altonio Schwertner is a 44 y.o. 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 Unfortunately during the procedure a stent was not able to be placed d/t narrowing at the IVC. Per the patient the procedure provided 2-3 days of symptomatic relief (decrease in L LE pressure and pain) but after that his symptoms returned with increased pressure, pain in the hips and pain in the legs when standing. The patient does report his L LE wound is stable especially when he is consistently attending his wound care clinic in [place]. The patient denies using compressive LE therapies unless done at his wound clinic due to not being able to reach his legs. He is interested in learning more and possibly pursing further interventional options for his L lower extremity via access through neck. *Objective Physical Exam: BP 171/86  Pulse 80  Temp 37 C (Temporal)  Wt (!) 147.4 kg (325 lb)  BMI 41.73 kg/m General: awake, alert and oriented x 3 Chest: Non labored breathing on room air CVS: Normal rate. Regular rhythm. ABD: soft, NT, ND Ext: bilateral leg edema w/ left leg in compressive dressing from wound clinic. Evidence of skin discoloration on both feet bilaterally. Data Review: Labs: None to review Imaging:Reviewed report of 10/15 L LE venogram. I saw and evaluated the patient, participating in the key portions of the service. I reviewed the residentoos note. I agree with the residentoos findings and plan. Electronically signed by Derry Skill, MD at 09/16/2019 10:53 AM EST 12/11 the areas around his ankles have healed. 2 areas in the center part of the mid tibia area are still open we have been using silver collagen. Procedure attempt by Dr. Trula Ore at [facility] on 11/03/2019 12/18; the wounds around his ankles remain closed. I think there has been some improvement in the 2 areas in the center part of the mid tibia. Especially superiorly. Debris on the center required debridement we have been using silver collagen 12/31; I saw the patient briefly last week  when he was here for a nurse visit. He had irritation in which look believed to be localized cellulitis and folliculitis on the lateral left calf. I gave him a prescription for cephalexin although he admits today he never filled it. The area still looked very angry. His original wounds we have been treating where a figure-of-eight shaped area on the anterior tibial area mid aspect. We have been using silver collagen these do not look healthy. 11/07/2019. The patient has completed his antibiotics. The area of folliculitis on the left lateral calf looks somewhat better although there is still erythema there is no tenderness. I am going to  put some more compression on this area and will have a look at this next week. No additional antibiotics. Since the patient was last here he went to see Dr. Trula Ore at [facility]. I am able to see his note from 11/03/2019 in care everywhere. The patient has a history of a right iliac vein stent and IVC filter placement. He is status post angioplasty of the left common iliac vein on 08/14/2019 for chronic obstruction and venous insufficiency. Unfortunately a stent could not be placed during the procedure due to narrowing at the IVC from fibrotic change of the right iliac vein stent. As I understand things they are now going to approach this from both sides i.e. through an area in the internal jugular vein as well as the more standard distal approach and see if this area can be stented. The thought was 5 to 6 weeks before this could be arranged as it will "require 2 surgeons, insurance approval etc. The original wound on his anterior tibia looks better. He has the area on the left lateral tibia with localized swelling. He has a new area on the left medial calcaneus which is small punched out and very painful. I think this looks similar to wounds he has had in this area before. He has severe venous hypertension 1/15; anterior tibia wound has come down from a figure-of-eight to 1  wound. Has some depth which looks clean. The area on the left lateral calf looks better. We put additional compression in here and the edema in this area looks better. His major complaint is on the medial calcaneus. He had mentioned this last week and he has had wounds like this before. Pain is severe 1/22; anterior tibial wound seems to be closing at with shallower and better looking wound margin. He has an area anteriorly that I think was initially an area of folliculitis the area on the left lateral calf has closed over The small area on the medial heel that I cultured last week grew methicillin sensitive staph aureus but resistant to the doxycycline I gave him. I substituted cephalexin 500 every 6 and has only been on this for 2 days. Still says the pain is very significant 1/29; all the patient's wounds look quite good today including the anterior tibial which is closing in. The area above this also closing in finally the very painful area on the medial heel. He is completing antibiotics today this cultured methicillin sensitive staph aureus. Back to using silver alginate on the wounds 2/12; in general things are quite good here. He still does not have a date for attempting to stand his left common iliac vein. Still an insurance issue. I have been encouraged him to move on this by calling his insurance company. There is not a good option here. 2/26; continued nice improvement. He still has the open area in the mid tibia that is incompletely closed. Surprisingly today the difficult area on his left medial calcaneus that was so painful and inflamed has epithelialized over. 3/5; the patient has his stenting procedure attempt at [facility] on 3/25. He continues to make nice progress the only remaining wound is on the left anterior tibial area. His heel medially remains epithelialized. We have been using Sorbac 3/19; his stenting procedure is actually on 4/15 per the patient today. Unfortunately he  was not here last week and did not use his pumps. He has an area of localized edema erythema and marked tenderness above where the patient claims the original wound was.  To be truthful we all had trouble determining what was new wound today and what was original wound. Patient thought it was the more distal medial area where is the more central area is what my nurse and I thought it was. In any case looking back on the pictures really did not clarify things. He has a small punched out area just medial to the tibia which the patient claims was his original wound superiorly he now has an open area which is superficial some satellite lesions very significant erythema and tenderness more centrally and superiorly to what is supposedly his original wound. A lot of tenderness here. We have been using Sorbact I changed to silver alginate today. He will need antibiotic Objective Constitutional Patient is hypertensive.. Pulse regular and within target range for patient.Marland Kitchen. Respirations regular, non-labored and within target range.. Temperature is normal and within the target range for the patient.Marland Kitchen. Appears in no distress. Vitals Time Taken: 7:40 AM, Height: 74 in, Weight: 325 lbs, BMI: 41.7, Temperature: 98.6 F, Pulse: 98 bpm, Respiratory Rate: 20 breaths/min, Blood Pressure: 149/84 mmHg. Respiratory work of breathing is normal. Cardiovascular Pedal pulses are palpable. General Notes: Wound exam; the area in question is on the left mid tibia. As noted we had trouble determining what was new and what was old today. He thinks the original wound is the distal area and the cluster of wounds is new. If that is the case he has 3 new wounds superior and more anterior to the original wound marked erythema and tenderness localized swelling. He also has a small area superior to this area. Integumentary (Hair, Skin) Marked tenderness in an area anterior to the original wound. Wound #17 status is Open. Original  cause of wound was Gradually Appeared. The wound is located on the Left,Proximal,Anterior Lower Leg. The wound measures 2.6cm length x 4cm width x 0.1cm depth; 8.168cm^2 area and 0.817cm^3 volume. There is no tunneling or undermining noted. There is a medium amount of serosanguineous drainage noted. The wound margin is distinct with the outline attached to the wound base. There is large (67-100%) red granulation within the wound bed. There is no necrotic tissue within the wound bed. Wound #9RR status is Open. Original cause of wound was Gradually Appeared. The wound is located on the Left,Anterior Lower Leg. The wound measures 0.9cm length x 1cm width x 0.1cm depth; 0.707cm^2 area and 0.071cm^3 volume. There is Fat Layer (Subcutaneous Tissue) Exposed exposed. There is no tunneling or undermining noted. There is a medium amount of serosanguineous drainage noted. The wound margin is distinct with the outline attached to the wound base. There is large (67-100%) red granulation within the wound bed. There is no necrotic tissue within the wound bed. Assessment Active Problems ICD-10 Non-pressure chronic ulcer of other part of left lower leg limited to breakdown of skin Chronic venous hypertension (idiopathic) with inflammation of left lower extremity Lymphedema, not elsewhere classified Other thrombophilia Cellulitis of left lower limb Procedures Wound #17 Pre-procedure diagnosis of Wound #17 is a Venous Leg Ulcer located on the Left,Proximal,Anterior Lower Leg . There was a Four Layer Compression Therapy Procedure by Shawn Stalleaton, Bobbi, RN. Post procedure Diagnosis Wound #17: Same as Pre-Procedure Plan Follow-up Appointments: Return Appointment in 1 week. - Friday Dressing Change Frequency: Do not change entire dressing for one week. Skin Barriers/Peri-Wound Care: TCA Cream or Ointment - to left leg. Wound Cleansing: May shower with protection. Primary Wound Dressing: Wound #17  Left,Proximal,Anterior Lower Leg: Calcium Alginate with Silver Wound #9RR  Left,Anterior Lower Leg: Calcium Alginate with Silver Secondary Dressing: Wound #17 Left,Proximal,Anterior Lower Leg: Dry Gauze ABD pad - extra ABD pad to lateral leg for increased compression Wound #9RR Left,Anterior Lower Leg: Dry Gauze ABD pad - extra ABD pad to lateral leg for increased compression Edema Control: 4 layer compression: Left lower extremity - Add extra folded ABD pad to lateral leg for additional compression Avoid standing for long periods of time Elevate legs to the level of the heart or above for 30 minutes daily and/or when sitting, a frequency of: Support Garment 30-40 mm/Hg pressure to: - right leg : apply compression stockings dual layer in the am and remove at night. Segmental Compressive Device. - lymphedema pumps 1 hour 1 times per day. The following medication(s) was prescribed: doxycycline monohydrate oral 100 mg capsule 1 capsule oral bid for 7 days for celluitis left leg starting 01/16/2020 1. I use silver alginate on all of this 2. Empiric doxycycline 100 twice daily for the area of cellulitis. This could be localized swelling with inflammation related to poorly controlled localized edema however I know of no way to really determine this. I marked the area 3. ABD and 4-layer compression 4. We will bring him back early next week to ensure adequate treatment of the presumed cellulitis 5. His attempt at stenting the area in his pelvic vein is actually on 4/15 by Dr. Trula Ore at [facility]. 6. I have asked him to go back on his compression pumps Electronic Signature(s) Signed: 01/16/2020 5:09:02 PM By: Baltazar Najjar MD Signed: 01/19/2020 5:18:21 PM By: Zandra Abts RN, BSN Entered By: Zandra Abts on 01/16/2020 16:16:43 -------------------------------------------------------------------------------- SuperBill Details Patient Name: Date of Service: AVONDRE, RICHENS 01/16/2020 Medical Record  [id number] Patient Account Number: [id number] Date of Birth/Sex: Treating RN: January 30, 1976 (44 y.o. [name] Primary Care Provider: [name], [name] Other Clinician: Referring Provider: Treating Provider/Extender:Kyrel Leighton, [name], [name] in Treatment: 38 Diagnosis Coding ICD-10 Codes Code Description L97.821 Non-pressure chronic ulcer of other part of left lower leg limited to breakdown of skin I87.322 Chronic venous hypertension (idiopathic) with inflammation of left lower extremity I89.0 Lymphedema, not elsewhere classified D68.69 Other thrombophilia L03.116 Cellulitis of left lower limb Facility Procedures CPT4 Code Description: [id number] (Facility Use Only) [phone] - APPLY MULTLAY COMPRS LWR LT LEG Modifier: Quantity: 1 Physician Procedures CPT4 Code Description: [id number] - WC PHYS LEVEL 4 - EST PT ICD-10 Diagnosis Description L03.116 Cellulitis of left lower limb I87.322 Chronic venous hypertension (idiopathic) with inflammat I89.0 Lymphedema, not elsewhere classified L97.821  Non-pressure chronic ulcer of other part of left lower skin Modifier: ion of left lowe leg limited to b Quantity: 1 r extremity reakdown of Electronic Signature(s) Signed: 01/16/2020 5:09:02 PM By: Baltazar Najjar MD Entered By: Baltazar Najjar on 01/16/2020 08:36:13

## 2020-01-19 NOTE — Progress Notes (Signed)
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 patient has a history of pulmonary embolism with an IVC filter. 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 He is on chronic anticoagulation. 7/2; the area on the left mid tibia did not have a viable surface today somewhat surprising adherent black necrotic surface. Not even really eschared. No evidence of infection. We did silver alginate last week 7/9; left mid tibia somewhat improved surface and slightly smaller. Using Iodoflex. 7/16 left mid tibia. Continues to be slightly smaller with improved surface using Iodoflex. He did not see Dr. Gladys Damme with regards to the CT scan on the right leg because his insurance did not approve it 7/23; left mid tibia.  No change in surface area however the wound surface looks better. He has another small area superiorly which is  a small hyper granulated lesion. But this is of unclear etiology however it is defined is new this week. He also had significant bleeding reported by our intake nurse the exact source of this was unclear. 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 His original wound looks quite a bit better and how it every he is developed over the last 3 weeks a raised painful area just above the wound. I am not sure if this represents a primary cutaneous issue or a venous issue. He seems to have a palpable vein underneath this which is tender may represent superficial venous thrombosis. He is already on Xarelto. 8/6-Patient returns at 1 week for his left mid tibial wounds, the more proximal of the 2 wounds was raised painful area described last time, patient states this is less painful than before, he is almost completed his doxycycline, the wound area is larger but the wound itself is less painful. 8/13-Left anterior leg wound looks smaller and overall making good progress the other wound has healed. We are using Hydrofera Blue 8/20; the patient has 2 open areas. The original inferior area and the new area superiorly. He has been using Hydrofera Blue ready but the wounds overall look dry. He has not managed to get a CT scan approved and hence has not seen Dr. Trula Ore at [facility] 8/27 most of the patient's wound areas on the anterior look epithelialized. There is still some eschar and vulnerability here. He has been wearing to press stockings which should give him 30/40 mmHg compression. He also has compression pumps that belonged to his father. I think it would be reasonable to try and get him external compression pumps. He was not using the compression pumps daily and these certainly need to be used daily. Finally he was  approved for his CT scan on the right with follow-up with Dr. Trula Ore. We will asked Dr. Trula Ore to look at his left leg as well 9/3; the patient had 2 wound areas. Central tibial area of area is healed. Smaller wound superiorly still perhaps slightly open. I believe his CT scan on the right hip and pelvis areas on the 18th. That was ordered by Dr. Trula Ore. We have ordered him 30/40 stockings and are going to attempt to get him compression pumps 9/10-Patient returns at 1 week for the 2 small areas on his left anterior leg, these areas are healed. Patient is now going to going to the 30/40 stockings. His CT scan of his right leg hip and pelvis areas is scheduled 9/22 the patient was discharged from the clinic on 9/10. He tells me that he started developing pain on the left anterior tibial area and swelling on Thursday of last week. By Saturday the swelling was so prominent that he was scared to take his stocking off because he would be able to get it back on. There was increased pain. He was seen in the emergency department at [facility] on 9/1. 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 He was also felt to have cellulitis. There was apparently not any acute clot. He I he was put on a 3 time a day medication/antibiotic which I am assuming is clindamycin. He has 2 reopened areas both on the anterior tibia 9/29; he completed the clindamycin even though it caused diarrhea. The diarrhea is resolved.  The cellulitis in the left leg that was prominent last week has resolved. 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 I looked in [place] link I do not see the actual CT scan report. He is being apparently scheduled  for an attempt at stenting retrograde and then if that does not work anterior grade by Dr. Trula Ore. We use silver alginate to the wound last week because of coexistent cellulitis 10/6; cellulitis on the left leg resolved. He is still waiting for insurance authorization for his pelvic vein stenting. Changed him to [facility] last week 10/13; cellulitis remains resolved. Wound is smaller. Using Hydrofera Blue under compression. In 2 days time he is going for his venous stenting hopefully by Dr. Trula Ore at [facility] 10/20; the patient went for his procedure last Thursday by Dr. Trula Ore although he does not remember in the postop period what he was told. He is not certain whether Dr. Trula Ore managed to get the stent then successfully. His wound is measuring smaller looks healthy on the left mid tibia. He is also having some discomfort behind his left knee that he had me look at which is the site where Dr. Trula Ore had venous access. I looked in care everywhere. I could not see a specific note from Dr. Trula Ore [[facility]] above the actual procedure. Presumably it is still in transfer therefore I could not really answer his question about whether the stent was placed successful 10/27; wound not quite as good as last week. Using Hydrofera Blue. He thinks it might of stuck to the wound and was adherent when they took it off today. His procedure with Dr. Trula Ore was not successful. He thinks that Dr. Trula Ore will try to go at this from the other direction at some point. He sees him in 2-1/2-week 11/3; quite an improvement in wound surface area this week. We are using Hydrofera Blue. No need to change the dressing. Follows up with Dr. Trula Ore in about a week and a half 11/10; wound surface not as good this week at all. No changes in dimensions. We have been using Hydrofera Blue. As well he had tells [place] he had discomfort in his foot all week although he admits he was up on this more than usual. He comes in today  with 2 small punched out areas on the left lateral foot. He has severe venous hypertension. He sees Dr. Trula Ore again on 11/16 11/17; not much change in the area on the anterior tibia mid aspect. He also has 2 areas on the left lateral foot. We have been using Sorbact on the left anterior and Iodosorb ointment on the left foot. He is under 4-layer compression. The patient went to see Dr. Trula Ore I do not yet have access to this note and care everywhere. However according to the patient there is an option to try and address his venous occlusion I think via a transjugular approach. This is complicated by the fact that the IVC filter is there and would have to be traversed. According to the patient there is about a 33% chance of perforation may be even aortic perforation. However they would be prepared for this. He is thinking about this and discussing it with his wife Thanks to our case manager this week we are able to determine if for some reason the  patient is not using his compression pumps. He also is not able to get stockings on the right leg which are 30/40 below-knee. I think we may need to order external compression garments 12/1; patient arrives with the area on the left anterior mid tibia expanded superiorly small rim of skin between the original area and the new area. He has painful small denuded areas on the left medial and left lateral heel. We have been using Iodoflex. His next appointment with the Dr. Trula Ore is January 4. after the holidays to discuss if he would like to pursue this endovascular option. Dr. Sherrin Daisy notes below from last visit Subjective [name] is a 44 y.o. 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 Unfortunately during the procedure a  stent was not able to be placed d/t narrowing at the IVC. Per the patient the procedure provided 2-3 days of symptomatic relief (decrease in L LE pressure and pain) but after that his symptoms returned with increased pressure, pain in the hips and pain in the legs when standing. The patient does report his L LE wound is stable especially when he is consistently attending his wound care clinic in [place]. The patient denies using compressive LE therapies unless done at his wound clinic due to not being able to reach his legs. He is interested in learning more and possibly pursing further interventional options for his L lower extremity via access through neck. *Objective Physical Exam: BP 171/86  Pulse 80  Temp 37 C (Temporal)  Wt (!) 147.4 kg (325 lb)  BMI 41.73 kg/m General: awake, alert and oriented x 3 Chest: Non labored breathing on room air CVS: Normal rate. Regular rhythm. ABD: soft, NT, ND Ext: bilateral leg edema w/ left leg in compressive dressing from wound clinic. Evidence of skin discoloration on both feet bilaterally. Data Review: Labs: None to review Imaging:Reviewed report of 10/15 L LE venogram. I saw and evaluated the patient, participating in the key portions of the service. I reviewed the residentoos note. I agree with the residentoos findings and plan. Electronically signed by Derry Skill, MD at 09/16/2019 10:53 AM EST 12/11 the areas around his ankles have healed. 2 areas in the center part of the mid tibia area are still open we have been using silver collagen. Procedure attempt by Dr. Trula Ore at [facility] on 11/03/2019 12/18; the wounds around his ankles remain closed. I think there has been some improvement in the 2 areas in the center part of the mid tibia. Especially superiorly. Debris on the center required debridement we have been using silver collagen 12/31; I saw the patient briefly last week when he was here for a nurse visit. He had  irritation in which look believed to be localized cellulitis and folliculitis on the lateral left calf. I gave him a prescription for cephalexin although he admits today he never filled it. The area still looked very angry. His original wounds we have been treating where a figure-of-eight shaped area on the anterior tibial area mid aspect. We have been using silver collagen these do not look healthy. 11/07/2019. The patient has completed his antibiotics. The area of folliculitis on the left lateral calf looks somewhat better although there is still erythema there is no tenderness. I am going to put some  more compression on this area and will have a look at this next week. No additional antibiotics. Since the patient was last here he went to see Dr. Trula Ore at [facility] I am able to see his note from 11/03/2019 in care everywhere. The patient has a history of a right iliac vein stent and IVC filter placement. He is status post angioplasty of the left common iliac vein on 08/14/2019 for chronic obstruction and venous insufficiency. Unfortunately a stent could not be placed during the procedure due to narrowing at the IVC from fibrotic change of the right iliac vein stent. As I understand things they are now going to approach this from both sides i.e. through an area in the internal jugular vein as well as the more standard distal approach and see if this area can be stented. The thought was 5 to 6 weeks before this could be arranged as it will "require 2 surgeons, insurance approval etc. The original wound on his anterior tibia looks better. He has the area on the left lateral tibia with localized swelling. He has a new area on the left medial calcaneus which is small punched out and very painful. I think this looks similar to wounds he has had in this area before. He has severe venous hypertension 1/15; anterior tibia wound has come down from a figure-of-eight to 1 wound. Has some depth which looks clean.  The area on the left lateral calf looks better. We put additional compression in here and the edema in this area looks better. His major complaint is on the medial calcaneus. He had mentioned this last week and he has had wounds like this before. Pain is severe 1/22; anterior tibial wound seems to be closing at with shallower and better looking wound margin. He has an area anteriorly that I think was initially an area of folliculitis the area on the left lateral calf has closed over The small area on the medial heel that I cultured last week grew methicillin sensitive staph aureus but resistant to the doxycycline I gave him. I substituted cephalexin 500 every 6 and has only been on this for 2 days. Still says the pain is very significant 1/29; all the patient's wounds look quite good today including the anterior tibial which is closing in. The area above this also closing in finally the very painful area on the medial heel. He is completing antibiotics today this cultured methicillin sensitive staph aureus. Back to using silver alginate on the wounds 2/12; in general things are quite good here. He still does not have a date for attempting to stand his left common iliac vein. Still an insurance issue. I have been encouraged him to move on this by calling his insurance company. There is not a good option here. 2/26; continued nice improvement. He still has the open area in the mid tibia that is incompletely closed. Surprisingly today the difficult area on his left medial calcaneus that was so painful and inflamed has epithelialized over. 3/5; the patient has his stenting procedure attempt at [facility] on 3/25. He continues to make nice progress the only remaining wound is on the left anterior tibial area. His heel medially remains epithelialized. We have been using Sorbac 3/19; his stenting procedure is actually on 4/15 per the patient today. Unfortunately he was not here last week and did not use his  pumps. He has an area of localized edema erythema and marked tenderness above where the patient claims the original wound was. To be  truthful we all had trouble determining what was new wound today and what was original wound. Patient thought it was the more distal medial area where is the more central area is what my nurse and I thought it was. In any case looking back on the pictures really did not clarify things. He has a small punched out area just medial to the tibia which the patient claims was his original wound superiorly he now has an open area which is superficial some satellite lesions very significant erythema and tenderness more centrally and superiorly to what is supposedly his original wound. A lot of tenderness here. We have been using Sorbact I changed to silver alginate today. He will need antibiotic 3/22; back early for review of probable cellulitis in the mid tibia area. This also could have been localized stasis dermatitis from not changing his wrap and not using compression. We put him on doxycycline empirically things look better he is in less pain no systemic illness Objective Constitutional Patient is hypertensive.. Pulse regular and within target range for patient.Marland Kitchen Respirations regular, non-labored and within target range.. Temperature is normal and within the target range for the patient.Marland Kitchen Appears in no distress. Vitals Time Taken: 7:40 AM, Height: 74 in, Weight: 325 lbs, BMI: 41.7, Temperature: 98.0 F, Pulse: 80 bpm, Respiratory Rate: 20 breaths/min, Blood Pressure: 140/86 mmHg. Respiratory work of breathing is normal. Cardiovascular Peripheral pulses are palpable. We have good edema control on the left. General Notes: Wound exam the area in question is in the left mid tibia. His wounds certainly are not any worse perhaps slightly better he has the clustered area in the middle of where the cellulitis was and the second area distal and just medial to the tibia.  The area is not tender. Everything looks less inflamed Integumentary (Hair, Skin) Wound #17 status is Open. Original cause of wound was Gradually Appeared. The wound is located on the Left,Proximal,Anterior Lower Leg. The wound measures 3cm length x 3.5cm width x 0.1cm depth; 8.247cm^2 area and 0.825cm^3 volume. There is Fat Layer (Subcutaneous Tissue) Exposed exposed. There is no tunneling or undermining noted. There is a medium amount of serosanguineous drainage noted. The wound margin is distinct with the outline attached to the wound base. There is large (67-100%) red granulation within the wound bed. There is a small (1-33%) amount of necrotic tissue within the wound bed including Adherent Slough. Wound #9RR status is Open. Original cause of wound was Gradually Appeared. The wound is located on the Left,Anterior Lower Leg. The wound measures 0.7cm length x 0.6cm width x 0.1cm depth; 0.33cm^2 area and 0.033cm^3 volume. There is Fat Layer (Subcutaneous Tissue) Exposed exposed. There is no tunneling or undermining noted. There is a medium amount of serosanguineous drainage noted. The wound margin is distinct with the outline attached to the wound base. There is large (67-100%) red granulation within the wound bed. There is a small (1-33%) amount of necrotic tissue within the wound bed including Adherent Slough. Assessment Active Problems ICD-10 Non-pressure chronic ulcer of other part of left lower leg limited to breakdown of skin Chronic venous hypertension (idiopathic) with inflammation of left lower extremity Cellulitis of left lower limb Lymphedema, not elsewhere classified Other thrombophilia Procedures Wound #17 Pre-procedure diagnosis of Wound #17 is a Venous Leg Ulcer located on the Left,Proximal,Anterior Lower Leg . There was a Four Layer Compression Therapy Procedure by Zandra Abts, RN. Post procedure Diagnosis Wound #17: Same as Pre-Procedure Wound #9RR Pre-procedure  diagnosis of Wound #9RR is  a Venous Leg Ulcer located on the Left,Anterior Lower Leg . There was a Four Layer Compression Therapy Procedure by Zandra Abts, RN. Post procedure Diagnosis Wound #9RR: Same as Pre-Procedure Plan Follow-up Appointments: Return Appointment in: - Keep appointment for Friday 3/26 Dressing Change Frequency: Do not change entire dressing for one week. Skin Barriers/Peri-Wound Care: Moisturizing lotion TCA Cream or Ointment - mixed with lotion Wound Cleansing: May shower with protection. Primary Wound Dressing: Wound #17 Left,Proximal,Anterior Lower Leg: Calcium Alginate with Silver Wound #9RR Left,Anterior Lower Leg: Calcium Alginate with Silver Secondary Dressing: Wound #17 Left,Proximal,Anterior Lower Leg: Dry Gauze ABD pad - extra ABD pad to lateral leg for increased compression Wound #9RR Left,Anterior Lower Leg: Dry Gauze ABD pad - extra ABD pad to lateral leg for increased compression Edema Control: 4 layer compression: Left lower extremity - Add extra folded ABD pad to lateral leg for additional compression Avoid standing for long periods of time Elevate legs to the level of the heart or above for 30 minutes daily and/or when sitting, a frequency of: - throughout the day Support Garment 30-40 mm/Hg pressure to: - right leg : apply compression stockings dual layer in the am and remove at night. Segmental Compressive Device. - lymphedema pumps twice a day for 1 hour each time 1. He is going to be on doxycycline for 7 days starting last Friday. 2. Follow-up on this coming Friday 3. Still silver alginate to the wounds 4. Things look better today there is less inflammation less tenderness. To be truthful I am still not exactly certain whether this was cellulitis or a very localized painful stasis dermatitis caused by wrap injury, not using his pumps etc. Electronic Signature(s) Signed: 01/19/2020 5:34:39 PM By: Baltazar Najjar MD Entered By: Baltazar Najjar on 01/19/2020 08:08:21 -------------------------------------------------------------------------------- SuperBill Details Patient Name: Date of Service: [name], [name] 01/19/2020 Medical Record [id number] Patient Account Number: [id number] Date of Birth/Sex: Treating RN: 11-15-1975 (44 y.o. [name] Primary Care Provider: [name], [name] Other Clinician: Referring Provider: Treating Provider/Extender:Alannah Averhart, [name], [name] in Treatment: 38 Diagnosis Coding ICD-10 Codes Code Description L97.821 Non-pressure chronic ulcer of other part of left lower leg limited to breakdown of skin I87.322 Chronic venous hypertension (idiopathic) with inflammation of left lower extremity L03.116 Cellulitis of left lower limb I89.0 Lymphedema, not elsewhere classified D68.69 Other thrombophilia Facility Procedures CPT4 Code Description: [id number] (Facility Use Only) [phone] - APPLY MULTLAY COMPRS LWR LT LEG Modifier: Quantity: 1 Physician Procedures CPT4 Code Description: [id number] - WC PHYS LEVEL 3 - EST PT ICD-10 Diagnosis Description L97.821 Non-pressure chronic ulcer of other part of left lower l skin I87.322 Chronic venous hypertension (idiopathic) with inflammati L03.116 Cellulitis of left  lower limb Modifier: eg limited to b on of left lowe Quantity: 1 reakdown of r extremity Electronic Signature(s) Signed: 01/19/2020 5:34:39 PM By: Baltazar Najjar MD Entered By: Baltazar Najjar on 01/19/2020 08:08:44

## 2020-01-20 NOTE — Progress Notes (Signed)
[name], [name] ([id number]) Visit Report for 01/16/2020 Arrival Information Details Patient Name: Date of Service: [name], [name] 01/16/2020 7:30 AM Medical Record [id number] Patient Account Number: [id number] Date of Birth/Sex: Treating RN: 1975-11-20 (44 y.o. [name] Primary Care [name]: [name], [name] Other Clinician: Referring Trellis Vanoverbeke: Treating Jayland Null/Extender:[name], [name], [name] in Treatment: 34 Visit Information History Since Last Visit Added or deleted any medications: No Patient Arrived: Ambulatory Any new allergies or adverse reactions: No Arrival Time: 07:42 Had a fall or experienced change in No Accompanied By: self activities of daily living that may affect Transfer Assistance: None risk of falls: Patient Identification Verified: Yes Signs or symptoms of abuse/neglect since last No Secondary Verification Process Completed: Yes visito Patient Requires Transmission-Based No Hospitalized since last visit: No Precautions: Implantable device outside of the clinic excluding No Patient Has Alerts: No cellular tissue based products placed in the center since last visit: Has Dressing in Place as Prescribed: Yes Has Compression in Place as Prescribed: Yes Pain Present Now: Yes Electronic Signature(s) Signed: 01/16/2020 5:19:35 PM By: Deon Pilling Entered By: Deon Pilling on 01/16/2020 07:42:51 -------------------------------------------------------------------------------- Compression Therapy Details Patient Name: Date of Service: [name], [name] 01/16/2020 7:30 AM Medical Record [id number] Patient Account Number: [id number] Date of Birth/Sex: Treating RN: 03-22-1976 (44 y.o. [name] Primary Care [name]: [name], [name] Other Clinician: Referring [name]: Treating [name]/Extender:[name], [name], [name] in Treatment: 38 Compression Therapy Performed for Wound Wound #17 Left,Proximal,Anterior Lower  Leg Assessment: Performed By: Clinician Deon Pilling, RN Compression Type: Four Layer Post Procedure Diagnosis Same as Pre-procedure Electronic Signature(s) Signed: 01/16/2020 5:10:17 PM By: Kela Millin Entered By: Kela Millin on 01/16/2020 08:13:24 -------------------------------------------------------------------------------- Encounter Discharge Information Details Patient Name: Date of Service: [name], [name] 01/16/2020 7:30 AM Medical Record [id number] Patient Account Number: [id number] Date of Birth/Sex: Treating RN: 1976-04-11 (44 y.o. [name] Primary Care Javae Braaten: [name], [name] Other Clinician: Referring [name]: Treating Jenner Rosier/Extender:[name], [name], [name] in Treatment: 60 Encounter Discharge Information Items Discharge Condition: Stable Ambulatory Status: Ambulatory Discharge Destination: Home Transportation: Private Auto Accompanied By: self Schedule Follow-up Appointment: Yes Clinical Summary of Care: Patient Declined Electronic Signature(s) Signed: 01/20/2020 5:59:14 PM By: Baruch Gouty RN, BSN Entered By: Baruch Gouty on 01/16/2020 08:35:33 -------------------------------------------------------------------------------- Lower Extremity Assessment Details Patient Name: Date of Service: [name], [name] 01/16/2020 7:30 AM Medical Record [id number] Patient Account Number: [id number] Date of Birth/Sex: Treating RN: January 21, 1976 (44 y.o. [name] Primary Care [name]: [name], [name] Other Clinician: Referring [name]: Treating [name]/Extender:[name], [name], [name] in Treatment: 38 Edema Assessment Assessed: [Left: Yes] [Right: No] Edema: [Left: Ye] [Right: s] Calf Left: Right: Point of Measurement: 35 cm From Medial Instep 44.5 cm cm Ankle Left: Right: Point of Measurement: 11.5 cm From Medial Instep 24 cm cm Vascular Assessment Pulses: Dorsalis Pedis Palpable:  [Left:Yes] Electronic Signature(s) Signed: 01/16/2020 5:19:35 PM By: Deon Pilling Entered By: Deon Pilling on 01/16/2020 07:46:20 -------------------------------------------------------------------------------- Multi Wound Chart Details Patient Name: Date of Service: [name], [name] 01/16/2020 7:30 AM Medical Record [id number] Patient Account Number: [id number] Date of Birth/Sex: Treating RN: 1976/05/03 (44 y.o. [name] Primary Care [name]: [name], [name] Other Clinician: Referring [name]: Treating [name]/Extender:[name], [name], [name] in Treatment: 75 Vital Signs Height(in): 74 Pulse(bpm): 98 Weight(lbs): 325 Blood Pressure(mmHg): 149/84 Body Mass Index(BMI): 42 Temperature(F): 98.6 Respiratory 20 Rate(breaths/min): Photos: [17:No Photos] [9RR:No Photos] [N/A:N/A] Wound Location: [17:Left Lower Leg - Anterior, Left Lower Leg - Anterior Proximal] [N/A:N/A] Wounding Event: [17:Gradually Appeared] [9RR:Gradually Appeared] [N/A:N/A] Primary Etiology: [17:Venous Leg Ulcer] [  9RR:Venous Leg Ulcer] [N/A:N/A] Secondary Etiology: [17:Lymphedema] [9RR:N/A] [N/A:N/A] Comorbid History: [17:Asthma, Sleep Apnea, DeepAsthma, Sleep Apnea, DeepN/A Vein Thrombosis, Phlebitis, Vein Thrombosis, Phlebitis, Osteoarthritis, Neuropathy Osteoarthritis, Neuropathy] Date Acquired: [17:01/16/2020] [9RR:03/31/2019] [N/A:N/A] Weeks of Treatment: [17:0] [9RR:38] [N/A:N/A] Wound Status: [17:Open] [9RR:Open] [N/A:N/A] Wound Recurrence: [17:No] [9RR:Yes] [N/A:N/A] Clustered Wound: [17:Yes] [9RR:No] [N/A:N/A] Clustered Quantity: [17:3] [9RR:N/A] [N/A:N/A] Measurements L x W x D 2.6x4x0.1 [9RR:0.9x1x0.1] [N/A:N/A] (cm) Area (cm) : [17:8.168] [8XK:4.818] [N/A:N/A] Volume (cm) : [56:3.149] [9RR:0.071] [N/A:N/A] % Reduction in Area: [17:N/A] [9RR:94.60%] [N/A:N/A] % Reduction in Volume: [17:N/A] [9RR:94.60%] [N/A:N/A] Classification: [17:Full Thickness Without Exposed  Support Structures Exposed Support Structures] [9RR:Full Thickness Without] [N/A:N/A] Exudate Amount: [17:Medium] [9RR:Medium] [N/A:N/A] Exudate Type: [17:Serosanguineous] [9RR:Serosanguineous] [N/A:N/A] Exudate Color: [17:red, brown] [9RR:red, brown] [N/A:N/A] Wound Margin: [17:Distinct, outline attached Distinct, outline attached N/A] Granulation Amount: [17:Large (67-100%)] [9RR:Large (67-100%)] [N/A:N/A] Granulation Quality: [17:Red] [9RR:Red] [N/A:N/A] Necrotic Amount: [17:None Present (0%)] [9RR:None Present (0%)] [N/A:N/A] Exposed Structures: [17:Fascia: No Fat Layer (Subcutaneous Tissue) Exposed: Yes Tissue) Exposed: No Tendon: No Muscle: No Joint: No Bone: No] [9RR:Fat Layer (Subcutaneous N/A Fascia: No Tendon: No Muscle: No Joint: No Bone: No] Epithelialization: [17:None Compression Therapy] [9RR:Medium (34-66%) N/A] [N/A:N/A N/A] Treatment Notes Electronic Signature(s) Signed: 01/16/2020 5:09:02 PM By: Linton Ham MD Signed: 01/16/2020 5:10:17 PM By: Kela Millin Entered By: Linton Ham on 01/16/2020 08:28:55 -------------------------------------------------------------------------------- [place] Details Patient Name: Date of Service: [name], [name] 01/16/2020 7:30 AM Medical Record [id number] Patient Account Number: [id number] Date of Birth/Sex: Treating RN: 02/06/1976 (44 y.o. [name] Primary Care Alisha Burgo: [name], [name] Other Clinician: Referring Donal Lynam: Treating Shey Yott/Extender:[name], [name], [name] in Treatment: 56 Active Inactive Venous Leg Ulcer Nursing Diagnoses: Actual venous Insuffiency (use after diagnosis is confirmed) Knowledge deficit related to disease process and management Goals: Patient will maintain optimal edema control Date Initiated: 11/14/2019 Target Resolution Date: 01/23/2020 Goal Status: Active Interventions: Assess peripheral edema status every visit. Compression as  ordered Treatment Activities: Therapeutic compression applied : 11/14/2019 Notes: Wound/Skin Impairment Nursing Diagnoses: Knowledge deficit related to ulceration/compromised skin integrity Goals: Patient/caregiver will verbalize understanding of skin care regimen Date Initiated: 07/22/2019 Target Resolution Date: 01/23/2020 Goal Status: Active Ulcer/skin breakdown will have a volume reduction of 30% by week 4 Date Inactivated: 08/26/2019 Target10/23/2020 Resolution Date Initiated: 07/22/2019 Date: Goal Status: Met Ulcer/skin breakdown will have a volume reduction of 50% by week 8 Date Initiated: 08/26/2019 Date Inactivated: 09/30/2019 Target Resolution Date: 09/19/2019 Goal Status: Unmet Unmet Reason: comorbities Ulcer/skin breakdown will have a volume reduction of 80% by week 12 Date Initiated: 09/30/2019 Target Resolution Date: 01/23/2020 Goal Status: Active Interventions: Assess patient/caregiver ability to obtain necessary supplies Assess patient/caregiver ability to perform ulcer/skin care regimen upon admission and as needed Assess ulceration(s) every visit Notes: Electronic Signature(s) Signed: 01/16/2020 5:10:17 PM By: Kela Millin Entered By: Kela Millin on 01/16/2020 07:49:22 -------------------------------------------------------------------------------- Pain Assessment Details Patient Name: Date of Service: [place]ORT[place]T, [place] 01/16/2020 7:30 AM Medical Record [id number] Patient Account Number: [id number] Date of Birth/Sex: Treating RN: [birth year] (44 y.o. [name] Primary Care Nga Rabon: [name], [name] Other Clinician: Referring Thessaly Mccullers: Treating Masa Lubin/Extender:[name], [name], [name] in Treatment: 96 Active Problems Location of Pain Severity and Description of Pain Patient Has Paino Yes Site Locations Pain Location: Generalized Pain Rate the pain. Current Pain Level: 8 Worst Pain Level: 10 Least Pain Level: 0 Tolerable  Pain Level: 8 Character of Pain Describe the Pain: Aching Pain Management and Medication Current Pain Management: Medication: No Cold Application: No Rest: No Massage: No Activity: No T.E.N.S.: No  Heat Application: No Leg drop or elevation: No Is the Current Pain Management Adequate: Adequate How does your wound impact your activities of daily livingo Sleep: No Bathing: No Appetite: No Relationship With Others: No Bladder Continence: No Emotions: No Bowel Continence: No Work: No Toileting: No Drive: No Dressing: No Hobbies: Yes Electronic Signature(s) Signed: 01/16/2020 5:19:35 PM By: Deon Pilling Entered By: Deon Pilling on 01/16/2020 07:44:07 -------------------------------------------------------------------------------- Patient/Caregiver Education Details Patient Name: Date of Service: [name] 3/19/2021andnbsp7:30 AM Medical Record [id number] Patient Account Number: [id number] Date of Birth/Gender: 05-Jul-1976 (44 y.o. M) Treating RN: Kela Millin Primary Care Physician: [name], [name] Other Clinician: Referring Physician: Treating Physician/Extender:[name], [name], [name] in Treatment: 30 Education Assessment Education Provided To: Patient Education Topics Provided Wound/Skin Impairment: Methods: Explain/Verbal Responses: State content correctly Electronic Signature(s) Signed: 01/16/2020 5:10:17 PM By: Kela Millin Entered By: Kela Millin on 01/16/2020 07:49:37 -------------------------------------------------------------------------------- Wound Assessment Details Patient Name: Date of Service: [name], [name] 01/16/2020 7:30 AM Medical Record [id number] Patient Account Number: [id number] Date of Birth/Sex: Treating RN: Dec 18, 1975 (44 y.o. [name] Primary Care Yarel Rushlow: [name], [name] Other Clinician: Referring Dereck Agerton: Treating Hoda Hon/Extender:[name], [name], [name] in Treatment:  38 Wound Status Wound Number: [id number] Primary Venous Leg Ulcer Etiology: Wound Location: Left Lower Leg - Anterior, Proximal Secondary Lymphedema Wounding Event: Gradually Appeared Etiology: Date Acquired: 01/16/2020 Wound Open Weeks Of Treatment: 0 Status: Clustered Wound: Yes Comorbid Asthma, Sleep Apnea, Deep Vein History: Thrombosis, Phlebitis, Osteoarthritis, Neuropathy Photos Photo Uploaded By: Mikeal Hawthorne on 01/20/2020 14:46:00 Wound Measurements Length: (cm) 2.6 % Reduc Width: (cm) 4 % Reduc Depth: (cm) 0.1 Epithel Clustered Quantity: 3 Tunneli Area: (cm) 8.168 Underm Volume: (cm) 0.817 Wound Description Classification: Full Thickness Without Exposed Support Foul Odo Structures Slough/F Wound Distinct, outline attached Margin: Exudate Medium Amount: Exudate Serosanguineous Type: Exudate red, brown Color: Wound Bed Granulation Amount: Large (67-100%) Granulation Quality: Red Fascia E Necrotic Amount: None Present (0%) Fat Laye Tendon E Muscle E Joint Ex Bone Exp r After Cleansing: No ibrino No Exposed Structure xposed: No r (Subcutaneous Tissue) Exposed: No xposed: No xposed: No posed: No osed: No tion in Area: tion in Volume: ialization: None ng: No ining: No Electronic Signature(s) Signed: 01/16/2020 5:19:35 PM By: Deon Pilling Entered By: Deon Pilling on 01/16/2020 07:49:54 -------------------------------------------------------------------------------- Wound Assessment Details Patient Name: Date of Service: [name], [name] 01/16/2020 7:30 AM Medical Record [id number] Patient Account Number: [id number] Date of Birth/Sex: Treating RN: Feb 23, 1976 (44 y.o. [name] Primary Care Ellison Leisure: [name], [name] Other Clinician: Referring Bodin Gorka: Treating Sherry Rogus/Extender:[name], [name], [name] in Treatment: 38 Wound Status Wound Number: [id number] Primary Venous Leg Ulcer Etiology: Wound Location: Left Lower Leg -  Anterior Wound Open Wounding Event: Gradually Appeared Status: Date Acquired: 03/31/2019 Comorbid Asthma, Sleep Apnea, Deep Vein Thrombosis, Weeks Of Treatment: 38 History: Phlebitis, Osteoarthritis, Neuropathy Clustered Wound: No Photos Photo Uploaded By: Mikeal Hawthorne on 01/20/2020 14:46:01 Wound Measurements Length: (cm) 0.9 % Reduct Width: (cm) 1 % Reduct Depth: (cm) 0.1 Epitheli Area: (cm) 0.707 Tunneli Volume: (cm) 0.071 Undermi Wound Description Classification: Full Thickness Without Exposed Support Foul Odo Structures Slough/F Wound Distinct, outline attached Margin: Exudate Medium Amount: Exudate Serosanguineous Type: Exudate red, brown Color: Wound Bed Granulation Amount: Large (67-100%) Granulation Quality: Red Fascia E Necrotic Amount: None Present (0%) Fat Laye Tendon E Muscle E Joint Ex Bone Exp r After Cleansing: No ibrino No Exposed Structure xposed: No r (Subcutaneous Tissue) Exposed: Yes xposed: No xposed: No posed: No osed: No ion in Area: 94.6%  ion in Volume: 94.6% alization: Medium (34-66%) ng: No ning: No Electronic Signature(s) Signed: 01/16/2020 5:19:35 PM By: Deon Pilling Entered By: Deon Pilling on 01/16/2020 07:50:53 -------------------------------------------------------------------------------- Vitals Details Patient Name: Date of Service: [name], [name] 01/16/2020 7:30 AM Medical Record [id number] Patient Account Number: [id number] Date of Birth/Sex: Treating RN: Oct 23, 1976 (44 y.o. [name] Primary Care Gregg Holster: [name], [name] Other Clinician: Referring Dellanira Dillow: Treating Zebulen Simonis/Extender:[name], [name], [name] in Treatment: 4 Vital Signs Time Taken: 07:40 Temperature (F): 98.6 Height (in): 74 Pulse (bpm): 98 Weight (lbs): 325 Respiratory Rate (breaths/min): 20 Body Mass Index (BMI): 41.7 Blood Pressure (mmHg): 149/84 Reference Range: 80 - 120 mg / dl Electronic  Signature(s) Signed: 01/16/2020 5:19:35 PM By: Deon Pilling Entered By: Deon Pilling on 01/16/2020 07:44:46

## 2020-01-23 ENCOUNTER — Encounter (HOSPITAL_BASED_OUTPATIENT_CLINIC_OR_DEPARTMENT_OTHER): Payer: 59 | Admitting: Internal Medicine

## 2020-01-23 ENCOUNTER — Other Ambulatory Visit: Payer: Self-pay

## 2020-01-23 DIAGNOSIS — L97821 Non-pressure chronic ulcer of other part of left lower leg limited to breakdown of skin: Secondary | ICD-10-CM | POA: Diagnosis not present

## 2020-01-24 NOTE — Progress Notes (Signed)
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 pulmonary embolism with an IVC filter. 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 He is on chronic anticoagulation. 7/2; the area on the left mid tibia did not have a viable surface today somewhat surprising adherent black necrotic surface. Not even really eschared. No evidence of infection. We did silver alginate last week 7/9;  left mid tibia somewhat improved surface and slightly smaller. Using Iodoflex. 7/16 left mid tibia. Continues to be slightly smaller with improved surface using Iodoflex. He did not see Dr. Trula Ore with regards to the CT scan on the right leg  because his insurance did not approve it 7/23; left mid tibia. No change in surface area however the wound surface looks better. He has another small area superiorly which is a small hyper granulated lesion. But this is of unclear etiology however it is defined is new this week. He also had significant bleeding reported by our intake nurse the exact source of this was unclear. 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 His original wound looks quite a bit better and how it every he is developed over the last 3 weeks a raised painful area just above the wound. I am not sure if this represents a primary cutaneous issue or a venous issue. He seems to have a palpable vein underneath this which is tender may represent superficial venous thrombosis. He is already on Xarelto. 8/6-Patient returns at 1 week for his left mid tibial wounds, the more proximal of the 2 wounds was raised painful area described last time, patient states this is less painful than before, he is almost completed his doxycycline, the wound area is larger but the wound itself is less painful. 8/13-Left anterior leg wound looks smaller and overall making good progress the other wound has healed. We are using Hydrofera Blue 8/20; the patient has 2 open areas. The original inferior area and the new area superiorly. He has been using Hydrofera Blue ready but the wounds overall look dry. He has not managed to get a CT scan approved and hence has not seen Dr. Trula Ore at [facility] 8/27 most of the patient's wound areas on the anterior look epithelialized. There is still some eschar and vulnerability here. He has been wearing to  press stockings which should give him 30/40 mmHg compression. He also has compression pumps that belonged to his father. I think it would be reasonable to try and get him external compression pumps. He was not using the compression pumps daily and these certainly need to be used daily. Finally he was approved for his CT scan on the right with follow-up with Dr. Trula Ore. We will asked Dr. Trula Ore to look at his left leg as well 9/3; the patient had 2 wound areas. Central tibial area of area is healed. Smaller wound superiorly still perhaps slightly open. I believe his CT scan on the right hip and pelvis areas on the 18th. That was ordered by Dr. Trula Ore. We have ordered him 30/40 stockings and are going to attempt to get him compression pumps 9/10-Patient returns at 1 week for the 2 small areas on his left anterior leg, these areas are healed. Patient is now going to going to the 30/40 stockings. His CT scan of his right leg hip and pelvis areas is scheduled 9/22 the patient was discharged from the clinic on 9/10. He tells me that he started developing pain on the left anterior tibial area and swelling on Thursday of last week. By Saturday the swelling was so prominent that he was scared to take his stocking off because he would be able to get it back on. There was increased pain. He was seen in the emergency department at [facility] on 9/1. 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 He was also felt to have cellulitis. There was apparently not any acute clot. He I he was put on a 3 time a day medication/antibiotic  which I am assuming is clindamycin. He has 2 reopened areas both on the anterior tibia 9/29; he completed the clindamycin even though it caused diarrhea. The diarrhea is resolved. The cellulitis in the left leg that was prominent last week has  resolved. 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 I looked in [place] link I do not see the actual CT scan report. He is being apparently scheduled for an attempt at stenting retrograde and then if that does not work anterior grade by Dr. Trula Ore. We use silver alginate to the wound last week because of coexistent cellulitis 10/6; cellulitis on the left leg resolved. He is still waiting for insurance authorization for his pelvic vein stenting. Changed him to [facility] last week 10/13; cellulitis remains resolved. Wound is smaller. Using Hydrofera Blue under compression. In 2 days time he is going for his venous stenting hopefully by Dr. Trula Ore at [facility] 10/20; the patient went for his procedure last Thursday by Dr. Trula Ore although he does not remember in the postop period what he was told. He is not certain whether Dr. Trula Ore managed to get the stent then successfully. His wound is measuring smaller looks healthy on the left mid tibia. He is also having some discomfort behind his left knee that he had me look at which is the site where Dr. Trula Ore had venous access. I looked in care everywhere. I could not see a specific note from Dr. Trula Ore [[facility]] above the actual procedure. Presumably it is still in transfer therefore I could not really answer his question about whether the stent was placed successful 10/27; wound not quite as good as last week. Using Hydrofera Blue. He thinks it might of stuck to the wound and was adherent when they took it off today. His procedure with Dr. Trula Ore was not successful. He thinks that Dr. Trula Ore will try to go at this from the other direction at some point. He sees him in 2-1/2-week 11/3; quite an improvement in wound surface area this week. We are using Hydrofera Blue. No need to change the dressing.  Follows up with Dr. Trula Ore in about a week and a half 11/10; wound surface not as good this week at all. No changes in dimensions. We have been using Hydrofera Blue. As well he had tells [place] he had discomfort in his foot all week although he admits he was up on this more than usual. He comes in today with 2 small punched out areas on the left lateral foot. He has severe venous hypertension. He sees Dr. Trula Ore again on 11/16 11/17; not much change in the area on the anterior tibia mid aspect. He also has 2 areas on the left lateral foot. We have been using Sorbact on the left anterior and Iodosorb ointment on the left foot. He is under 4-layer compression. The patient went to see Dr. Trula Ore I do not yet have access to this note and care everywhere. However according to the patient there is an option to try and address his venous occlusion I think via a transjugular approach. This is complicated by the fact that the IVC filter is there and would have to be traversed. According to the patient there is about a 33% chance of perforation may be even aortic perforation. However they would be prepared  for this. He is thinking about this and discussing it with his wife Thanks to our case manager this week we are able to determine if for some reason the patient is not using his compression pumps. He also is not able to get stockings on the right leg which are 30/40 below-knee. I think we may need to order external compression garments 12/1; patient arrives with the area on the left anterior mid tibia expanded superiorly small rim of skin between the original area and the new area. He has painful small denuded areas on the left medial and left lateral heel. We have been using Iodoflex. His next appointment with the Dr. Trula Ore is January 4. after the holidays to discuss if he would like to pursue this endovascular option. Dr. Sherrin Daisy notes below from last visit Subjective Zerek Litsey is a 44 y.o. 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 Unfortunately during the procedure a stent was not able to be placed d/t narrowing at the IVC. Per the patient the procedure provided 2-3 days of symptomatic relief (decrease in L LE pressure and pain) but after that his symptoms returned with increased pressure, pain in the hips and pain in the legs when standing. The patient does report his L LE wound is stable especially when he is consistently attending his wound care clinic in [place]. The patient denies using compressive LE therapies unless done at his wound clinic due to not being able to reach his legs. He is interested in learning more and possibly pursing further interventional options for his L lower extremity via access through neck. *Objective Physical Exam: BP 171/86  Pulse 80  Temp 37 C (Temporal)  Wt (!) 147.4 kg (325 lb)  BMI 41.73 kg/m General: awake, alert and oriented x 3 Chest: Non labored breathing on room air CVS: Normal rate. Regular rhythm. ABD: soft, NT, ND Ext: bilateral leg edema w/ left leg in compressive dressing from wound clinic. Evidence of skin discoloration on both feet bilaterally. Data Review: Labs: None to review Imaging:Reviewed report of 10/15 L LE venogram. I saw and evaluated the patient, participating in the key portions of the service. I reviewed the residentoos note. I agree with the residentoos findings and plan. Electronically signed by Derry Skill, MD at 09/16/2019 10:53 AM EST 12/11 the areas around his ankles have healed. 2 areas in the center part of the mid tibia area are still open we have been using silver collagen. Procedure attempt by Dr. Trula Ore at [facility] on 11/03/2019 12/18; the wounds around his  ankles remain closed. I think there has been some improvement in the 2 areas in the center part of the mid tibia. Especially superiorly. Debris on the center required debridement we have been using silver collagen 12/31; I saw the patient briefly last week when he was here for a nurse visit. He had irritation in which look believed to be localized cellulitis and folliculitis on the lateral left calf. I gave him a prescription for cephalexin although he admits today he never filled it. The area still looked very angry. His original wounds we have been treating where a figure-of-eight shaped area on the anterior tibial area mid aspect. We have been using silver collagen these do not look healthy. 11/07/2019. The patient has  completed his antibiotics. The area of folliculitis on the left lateral calf looks somewhat better although there is still erythema there is no tenderness. I am going to put some more compression on this area and will have a look at this next week. No additional antibiotics. Since the patient was last here he [facility] I am able to see his note from 11/03/2019 in care everywhere. The patient has a history of a right iliac vein stent and IVC filter placement. He is status post angioplasty of the left common iliac vein on 08/14/2019 for chronic obstruction and venous insufficiency. Unfortunately a stent could not be placed during the procedure due to narrowing at the IVC from fibrotic change of the right iliac vein stent. As I understand things they are now going to approach this from both sides i.e. through an area in the internal jugular vein as well as the more standard distal approach and see if this area can be stented. The thought was 5 to 6 weeks before this could be arranged as it will "require 2 surgeons, insurance approval etc. The original wound on his anterior tibia looks better. He has the area on the left lateral tibia with localized swelling. He has  a new area on the left medial calcaneus which is small punched out and very painful. I think this looks similar to wounds he has had in this area before. He has severe venous hypertension 1/15; anterior tibia wound has come down from a figure-of-eight to 1 wound. Has some depth which looks clean. The area on the left lateral calf looks better. We put additional compression in here and the edema in this area looks better. His major complaint is on the medial calcaneus. He had mentioned this last week and he has had wounds like this before. Pain is severe 1/22; anterior tibial wound seems to be closing at with shallower and better looking wound margin. He has an area anteriorly that I think was initially an area of folliculitis the area on the left lateral calf has closed over The small area on the medial heel that I cultured last week grew methicillin sensitive staph aureus but resistant to the doxycycline I gave him. I substituted cephalexin 500 every 6 and has only been on this for 2 days. Still says the pain is very significant 1/29; all the patient's wounds look quite good today including the anterior tibial which is closing in. The area above this also closing in finally the very painful area on the medial heel. He is completing antibiotics today this cultured methicillin sensitive staph aureus. Back to using silver alginate on the wounds 2/12; in general things are quite good here. He still does not have a date for attempting to stand his left common iliac vein. Still an insurance issue. I have been encouraged him to move on this by calling his insurance company. There is not a good option here. 2/26; continued nice improvement. He still has the open area in the mid tibia that is incompletely closed. Surprisingly today the difficult area on his left medial calcaneus that was so painful and inflamed has epithelialized over. 3/5; the [facility]  He continues to make nice progress the only remaining wound is on the left anterior tibial area. His heel medially remains epithelialized. We have been using Sorbac 3/19; his stenting procedure is actually on 4/15 per the patient today. Unfortunately he was not here  last week and did not use his pumps. He has an area of localized edema erythema and marked tenderness above where the patient claims the original wound was. To be truthful we all had trouble determining what was new wound today and what was original wound. Patient thought it was the more distal medial area where is the more central area is what my nurse and I thought it was. In any case looking back on the pictures really did not clarify things. He has a small punched out area just medial to the tibia which the patient claims was his original wound superiorly he now has an open area which is superficial some satellite lesions very significant erythema and tenderness more centrally and superiorly to what is supposedly his original wound. A lot of tenderness here. We have been using Sorbact I changed to silver alginate today. He will need antibiotic 3/22; back early for review of probable cellulitis in the mid tibia area. This also could have been localized stasis dermatitis from not changing his wrap and not using compression. We put him on doxycycline empirically things look better he is in less pain no systemic illness 3/26; in for his usual appointment. The cellulitis/localized stasis dermatitis we are concerned about a week ago has resolved. He is completing the doxycycline. I really think this was localized poor edema control rather than cellulitis although these things are difficult to be certain. Will use silver alginate on the remaining wound which looks healthy on the left anterior mid tibia Objective Constitutional Patient is hypertensive.. Pulse regular and within target range for patient.Marland Kitchen Respirations regular, non-labored  and within target range.. Temperature is normal and within the target range for the patient.Marland Kitchen Appears in no distress. Vitals Time Taken: 8:22 AM, Height: 74 in, Weight: 325 lbs, BMI: 41.7, Temperature: 98.2 F, Pulse: 76 bpm, Respiratory Rate: 18 breaths/min, Blood Pressure: 160/80 mmHg. General Notes: Wound exam; area questions on the left mid tibia. His wound looks improved. Wash with Anasept and gauze. No need for mechanical debridement. Small area in the left mid tibia a raised inflamed area around this edge is also resolved. Integumentary (Hair, Skin) Wound #17 status is Open. Original cause of wound was Gradually Appeared. The wound is located on the Left,Proximal,Anterior Lower Leg. The wound measures 0.1cm length x 0.1cm width x 0.1cm depth; 0.008cm^2 area and 0.001cm^3 volume. There is Fat Layer (Subcutaneous Tissue) Exposed exposed. There is no tunneling or undermining noted. There is a medium amount of serosanguineous drainage noted. The wound margin is distinct with the outline attached to the wound base. There is large (67-100%) red granulation within the wound bed. There is a small (1-33%) amount of necrotic tissue within the wound bed including Adherent Slough. Wound #9RR status is Open. Original cause of wound was Gradually Appeared. The wound is located on the Left,Anterior Lower Leg. The wound measures 2cm length x 1cm width x 0.1cm depth; 1.571cm^2 area and 0.157cm^3 volume. There is Fat Layer (Subcutaneous Tissue) Exposed exposed. There is no tunneling or undermining noted. There is a medium amount of serosanguineous drainage noted. The wound margin is distinct with the outline attached to the wound base. There is large (67-100%) red granulation within the wound bed. There is a small (1-33%) amount of necrotic tissue within the wound bed including Adherent Slough. Assessment Active Problems ICD-10 Non-pressure chronic ulcer of other part of left lower leg limited to  breakdown of skin Chronic venous hypertension (idiopathic) with inflammation of left lower extremity Cellulitis of  left lower limb Lymphedema, not elsewhere classified Other thrombophilia Procedures Wound #17 Pre-procedure diagnosis of Wound #17 is a Venous Leg Ulcer located on the Left,Proximal,Anterior Lower Leg . There was a Four Layer Compression Therapy Procedure by Shawn Stall, RN. Post procedure Diagnosis Wound #17: Same as Pre-Procedure Wound #9RR Pre-procedure diagnosis of Wound #9RR is a Venous Leg Ulcer located on the Left,Anterior Lower Leg . There was a Four Layer Compression Therapy Procedure by Shawn Stall, RN. Post procedure Diagnosis Wound #9RR: Same as Pre-Procedure Plan Follow-up Appointments: Return Appointment in 1 week. - Nurse Visit Return Appointment in 2 weeks. - MD visit, Friday Dressing Change Frequency: Do not change entire dressing for one week. Skin Barriers/Peri-Wound Care: Moisturizing lotion TCA Cream or Ointment - mixed with lotion Wound Cleansing: May shower with protection. Primary Wound Dressing: Wound #17 Left,Proximal,Anterior Lower Leg: Calcium Alginate with Silver Wound #9RR Left,Anterior Lower Leg: Calcium Alginate with Silver Secondary Dressing: Wound #17 Left,Proximal,Anterior Lower Leg: Dry Gauze ABD pad - extra ABD pad to lateral leg for increased compression Wound #9RR Left,Anterior Lower Leg: Dry Gauze ABD pad - extra ABD pad to lateral leg for increased compression Edema Control: 4 layer compression: Left lower extremity - Add extra folded ABD pad to lateral leg for additional compression Avoid standing for long periods of time Elevate legs to the level of the heart or above for 30 minutes daily and/or when sitting, a frequency of: - throughout the day Support Garment 30-40 mm/Hg pressure to: - right leg : apply compression stockings dual layer in the am and remove at night. Segmental Compressive Device. - lymphedema  pumps twice a day for 1 hour each time 1. The entire situation of the wound on the left mid tibia and the surrounding tissue is considerably better. I gave him empiric antibiotics in case this was cellulitis although my higher suspicion was that this was a localized area of swelling with stasis dermatitis 2. We will put him back with silver alginate and 4-layer compression. Nurse visit next week Electronic Signature(s) Signed: 01/24/2020 8:02:09 AM By: Baltazar Najjar MD Entered By: Baltazar Najjar on 01/23/2020 08:59:10 -------------------------------------------------------------------------------- SuperBill Details Patient Name: Date of Service: ORYON, GARY 01/23/2020 Medical Record [id number] Patient Account Number: [id number] Date of Birth/Sex: Treating RN: Jan 10, 1976 (44 y.o. [name] Primary Care Provider: [name], [name] Other Clinician: Referring Provider: Treating Provider/Extender:Yesha Muchow, [name], [name] in Treatment: 39 Diagnosis Coding ICD-10 Codes Code Description L97.821 Non-pressure chronic ulcer of other part of left lower leg limited to breakdown of skin I87.322 Chronic venous hypertension (idiopathic) with inflammation of left lower extremity L03.116 Cellulitis of left lower limb I89.0 Lymphedema, not elsewhere classified D68.69 Other thrombophilia Facility Procedures Physician Procedures CPT4 Code Description: [id number] - WC PHYS LEVEL 3 - EST PT ICD-10 Diagnosis Description L97.821 Non-pressure chronic ulcer of other part of left lower l skin I87.322 Chronic venous hypertension (idiopathic) with inflammati L03.116 Cellulitis of left  lower limb Modifier: eg limited to b on of left lowe Quantity: 1 reakdown of r extremity Electronic Signature(s) Signed: 01/24/2020 8:02:09 AM By: Baltazar Najjar MD Entered By: Baltazar Najjar on 01/23/2020 08:59:30

## 2020-01-24 NOTE — Progress Notes (Signed)
KENDARRIUS, TANZI ([id number]) Visit Report for 01/23/2020 Arrival Information Details Patient Name: Date of Service: PADRAIG, NHAN 01/23/2020 7:30 AM Medical Record [id number] Patient Account Number: [id number] Date of Birth/Sex: Treating RN: 05/20/1976 (43 y.o. [name]) Carlene Coria Primary Care Shiryl Ruddy: [name], [name] Other Clinician: Referring Rosendo Couser: Treating Satara Virella/Extender:[name], [name], [name] in Treatment: 39 Visit Information History Since Last Visit All ordered tests and consults were completed: No Patient Arrived: Ambulatory Added or deleted any medications: No Arrival Time: 08:21 Any new allergies or adverse reactions: No Accompanied By: self Had a fall or experienced change in No Transfer Assistance: None activities of daily living that may affect Patient Identification Verified: Yes risk of falls: Secondary Verification Process Completed: Yes Signs or symptoms of abuse/neglect since last No Patient Requires Transmission-Based No visito Precautions: Hospitalized since last visit: No Patient Has Alerts: No Implantable device outside of the clinic excluding No cellular tissue based products placed in the center since last visit: Has Dressing in Place as Prescribed: Yes Has Compression in Place as Prescribed: Yes Pain Present Now: No Electronic Signature(s) Signed: 01/23/2020 5:43:34 PM By: Carlene Coria RN Entered By: Carlene Coria on 01/23/2020 08:22:20 -------------------------------------------------------------------------------- Compression Therapy Details Patient Name: Date of Service: DALEN, HENNESSEE 01/23/2020 7:30 AM Medical Record [id number] Patient Account Number: [id number] Date of Birth/Sex: Treating RN: [birth year] (44 y.o. [name] Primary Care Graceanna Theissen: [name], [name] Other Clinician: Referring Anthonymichael Munday: Treating Jasma Seevers/Extender:[name], [name], [name] in Treatment: 39 Compression Therapy Performed  for Wound Wound #17 Left,Proximal,Anterior Lower Leg Assessment: Performed By: Clinician Deon Pilling, RN Compression Type: Four Layer Post Procedure Diagnosis Same as Pre-procedure Electronic Signature(s) Signed: 01/23/2020 5:41:24 PM By: Kela Millin Entered By: Kela Millin on 01/23/2020 08:50:49 -------------------------------------------------------------------------------- Compression Therapy Details Patient Name: Date of Service: COVE, HAYDON 01/23/2020 7:30 AM Medical Record [id number] Patient Account Number: [id number] Date of Birth/Sex: Treating RN: [birth year] (44 y.o. [name] Primary Care Ahlayah Tarkowski: [name], [name] Other Clinician: Referring Audelia Knape: Treating Lillias Difrancesco/Extender:[name], [name], [name] in Treatment: 39 Compression Therapy Performed for Wound Wound #9RR Left,Anterior Lower Leg Assessment: Performed By: Clinician Deon Pilling, RN Compression Type: Four Layer Post Procedure Diagnosis Same as Pre-procedure Electronic Signature(s) Signed: 01/23/2020 5:41:24 PM By: Kela Millin Entered By: Kela Millin on 01/23/2020 08:50:49 -------------------------------------------------------------------------------- Encounter Discharge Information Details Patient Name: Date of Service: CHRISOPHER, PUSTEJOVSKY 01/23/2020 7:30 AM Medical Record [id number] Patient Account Number: [id number] Date of Birth/Sex: Treating RN: Aug 16, 1976 (44 y.o. [name] Primary Care Mardel Grudzien: [name], [name] Other Clinician: Referring Griffey Nicasio: Treating Adaiah Jaskot/Extender:[name], [name], [name] in Treatment: 84 Encounter Discharge Information Items Discharge Condition: Stable Ambulatory Status: Ambulatory Discharge Destination: Home Transportation: Private Auto Accompanied By: self Schedule Follow-up Appointment: Yes Clinical Summary of Care: Electronic Signature(s) Signed: 01/23/2020 5:42:35 PM By: Deon Pilling Entered By: Deon Pilling on 01/23/2020 09:27:58 -------------------------------------------------------------------------------- Lower Extremity Assessment Details Patient Name: Date of Service: CAMARION, WEIER 01/23/2020 7:30 AM Medical Record [id number] Patient Account Number: [id number] Date of Birth/Sex: Treating RN: 02/23/1976 (43 y.o. [name]) Carlene Coria Primary Care Jahyra Sukup: [name], [name] Other Clinician: Referring Ossiel Marchio: Treating Saintclair Schroader/Extender:[name], [name], [name] in Treatment: 39 Edema Assessment Assessed: [Left: No] [Right: No] Edema: [Left: Ye] [Right: s] Calf Left: Right: Point of Measurement: 35 cm From Medial Instep 44 cm cm Ankle Left: Right: Point of Measurement: 11.5 cm From Medial Instep 24 cm cm Electronic Signature(s) Signed: 01/23/2020 5:43:34 PM By: Carlene Coria RN Entered By: Carlene Coria on 01/23/2020 08:37:53 -------------------------------------------------------------------------------- Multi Wound Chart Details Patient Name: Date of Service: Kurek,  Alegandro 01/23/2020 7:30 AM Medical Record [id number] Patient Account Number: [id number] Date of Birth/Sex: Treating RN: [birth year] (44 y.o. [name] Primary Care Yuleni Burich: [name], [name] Other Clinician: Referring Liberato Stansbery: Treating Evadna Donaghy/Extender:[name], [name], [name] in Treatment: 39 Vital Signs Height(in): 74 Pulse(bpm): 76 Weight(lbs): 325 Blood Pressure(mmHg): 160/80 Body Mass Index(BMI): 42 Temperature(F): 98.2 Respiratory 18 Rate(breaths/min): Photos: [17:No Photos] [9RR:No Photos] [N/A:N/A] Wound Location: [17:Left, Proximal, Anterior Lower Leg] [9RR:Left, Anterior Lower Leg] [N/A:N/A] Wounding Event: [17:Gradually Appeared] [9RR:Gradually Appeared] [N/A:N/A] Primary Etiology: [17:Venous Leg Ulcer] [9RR:Venous Leg Ulcer] [N/A:N/A] Secondary Etiology: [17:Lymphedema] [9RR:N/A] [N/A:N/A] Comorbid History: [17:Asthma, Sleep  Apnea, DeepAsthma, Sleep Apnea, DeepN/A Vein Thrombosis, Phlebitis, Vein Thrombosis, Phlebitis, Osteoarthritis, Neuropathy Osteoarthritis, Neuropathy] Date Acquired: [17:01/16/2020] [9RR:03/31/2019] [N/A:N/A] Weeks of Treatment: [17:1] [9RR:39] [N/A:N/A] Wound Status: [17:Open] [9RR:Open] [N/A:N/A] Wound Recurrence: [17:No] [9RR:Yes] [N/A:N/A] Clustered Wound: [17:Yes] [9RR:No] [N/A:N/A] Clustered Quantity: [17:3] [9RR:N/A] [N/A:N/A] Measurements L x W x D 0.1x0.1x0.1 [9RR:2x1x0.1] [N/A:N/A] (cm) Area (cm) : [76:8.115] [9RR:1.571] [N/A:N/A] Volume (cm) : [17:0.001] [9RR:0.157] [N/A:N/A] % Reduction in Area: [17:99.90%] [9RR:88.10%] [N/A:N/A] % Reduction in Volume: 99.90% [9RR:88.10%] [N/A:N/A] Classification: [17:Full Thickness Without Exposed Support Structures Exposed Support Structures] [9RR:Full Thickness Without] [N/A:N/A] Exudate Amount: [17:Medium] [9RR:Medium] [N/A:N/A] Exudate Type: [17:Serosanguineous] [9RR:Serosanguineous] [N/A:N/A] Exudate Color: [17:red, brown] [9RR:red, brown] [N/A:N/A] Wound Margin: [17:Distinct, outline attached Distinct, outline attached N/A] Granulation Amount: [17:Large (67-100%)] [9RR:Large (67-100%)] [N/A:N/A] Granulation Quality: [17:Red] [9RR:Red] [N/A:N/A] Necrotic Amount: [17:Small (1-33%)] [9RR:Small (1-33%)] [N/A:N/A] Exposed Structures: [17:Fat Layer (Subcutaneous Fat Layer (Subcutaneous N/A Tissue) Exposed: Yes Fascia: No Tendon: No Muscle: No Joint: No Bone: No] [9RR:Tissue) Exposed: Yes Fascia: No Tendon: No Muscle: No Joint: No Bone: No] Epithelialization: [17:None] [9RR:Medium (34-66%) Compression Therapy] [N/A:N/A N/A] Treatment Notes Electronic Signature(s) Signed: 01/23/2020 5:41:24 PM By: Kela Millin Signed: 01/24/2020 8:02:09 AM By: Linton Ham MD Entered By: Linton Ham on 01/23/2020 08:53:53 -------------------------------------------------------------------------------- Multi-Disciplinary Care Plan Details Patient  Name: Date of Service: CONNIE, HILGERT 01/23/2020 7:30 AM Medical Record [id number] Patient Account Number: [id number] Date of Birth/Sex: Treating RN: [birth year] (44 y.o. [name] Primary Care Gracelin Weisberg: [name], [name] Other Clinician: Referring Tahjay Binion: Treating Marni Franzoni/Extender:[name], [name], [name] in Treatment: 39 Active Inactive Venous Leg Ulcer Nursing Diagnoses: Actual venous Insuffiency (use after diagnosis is confirmed) Knowledge deficit related to disease process and management Goals: Patient will maintain optimal edema control Date Initiated: 11/14/2019 Target Resolution Date: 02/13/2020 Goal Status: Active Interventions: Assess peripheral edema status every visit. Compression as ordered Treatment Activities: Therapeutic compression applied : 11/14/2019 Notes: Wound/Skin Impairment Nursing Diagnoses: Knowledge deficit related to ulceration/compromised skin integrity Goals: Patient/caregiver will verbalize understanding of skin care regimen Date Initiated: 07/22/2019 Target Resolution Date: 02/13/2020 Goal Status: Active Ulcer/skin breakdown will have a volume reduction of 30% by week 4 Date Inactivated: 08/26/2019 Target Resolution Date Initiated: 07/22/2019 Date: 08/22/2019 Goal Status: Met Ulcer/skin breakdown will have a volume reduction of 50% by week 8 Date Initiated: 08/26/2019 Date Inactivated: 09/30/2019 Target Resolution Date: 09/19/2019 Goal Status: Unmet Unmet Reason: comorbities Ulcer/skin breakdown will have a volume reduction of 80% by week 12 Date Initiated: 09/30/2019 Target Resolution Date: 02/13/2020 Goal Status: Active Interventions: Assess patient/caregiver ability to obtain necessary supplies Assess patient/caregiver ability to perform ulcer/skin care regimen upon admission and as needed Assess ulceration(s) every visit Notes: Electronic Signature(s) Signed: 01/23/2020 5:41:24 PM By: Kela Millin Entered  By: Kela Millin on 01/23/2020 08:34:26 -------------------------------------------------------------------------------- Pain Assessment Details Patient Name: Date of Service: MARCELLUS, PULLIAM 01/23/2020 7:30 AM Medical Record [id number] Patient Account Number: [id number] Date of Birth/Sex: Treating RN: 1976/05/25 (43  y.o. Oval Linsey Primary Care Jaonna Word: [name], [name] Other Clinician: Referring Taten Merrow: Treating Eniya Cannady/Extender:[name], [name], [name] in Treatment: 39 Active Problems Location of Pain Severity and Description of Pain Patient Has Paino No Site Locations Pain Management and Medication Current Pain Management: Electronic Signature(s) Signed: 01/23/2020 5:43:34 PM By: Carlene Coria RN Entered By: Carlene Coria on 01/23/2020 08:22:42 -------------------------------------------------------------------------------- Patient/Caregiver Education Details Patient Name: Date of Service: [name] 3/26/2021andnbsp7:30 AM Medical Record [id number] Patient Account Number: [id number] Date of Birth/Gender: 1976/05/17 (43 y.o. M) Treating RN: Kela Millin Primary Care Physician: [name], [name] Other Clinician: Referring Physician: Treating Physician/Extender:[name], [name], [name] in Treatment: 5 Education Assessment Education Provided To: Patient Education Topics Provided Wound/Skin Impairment: Methods: Explain/Verbal Responses: State content correctly Electronic Signature(s) Signed: 01/23/2020 5:41:24 PM By: Kela Millin Entered By: Kela Millin on 01/23/2020 08:35:09 -------------------------------------------------------------------------------- Wound Assessment Details Patient Name: Date of Service: KAESEN, RODRIGUEZ 01/23/2020 7:30 AM Medical Record [id number] Patient Account Number: [id number] Date of Birth/Sex: Treating RN: April 04, 1976 (43 y.o. Oval Linsey Primary Care Baylin Gamblin: [name],  [name] Other Clinician: Referring Salman Wellen: Treating Aland Chestnutt/Extender:[name], [name], [name] in Treatment: 39 Wound Status Wound Number: 17 Primary Venous Leg Ulcer Etiology: Wound Location: Left, Proximal, Anterior Lower Leg Secondary Lymphedema Wounding Event: Gradually Appeared Etiology: Date Acquired: 01/16/2020 Wound Open Weeks Of Treatment: 1 Status: Clustered Wound: Yes Comorbid Asthma, Sleep Apnea, Deep Vein History: Thrombosis, Phlebitis, Osteoarthritis, Neuropathy Wound Measurements Length: (cm) 0.1 % Reduct Width: (cm) 0.1 % Reduct Depth: (cm) 0.1 Epitheli Depth: (cm) 0.1 Epitheli Clustered Quantity: 3 Tunnelin Area: (cm) 0.008 Undermi Volume: (cm) 0.001 Wound Description Full Thickness Without Exposed Support Foul Odo Classification: Structures Slough/F Wound Distinct, outline attached Margin: Exudate Medium Amount: Exudate Serosanguineous Type: Exudate red, brown Color: Wound Bed Granulation Amount: Large (67-100%) Granulation Quality: Red Fascia E Necrotic Amount: Small (1-33%) Fat Laye Necrotic Quality: Adherent Slough Tendon E Muscle E Joint Ex Bone Exp r After Cleansing: No ibrino No Exposed Structure xposed: No r (Subcutaneous Tissue) Exposed: Yes xposed: No xposed: No posed: No osed: No ion in Area: 99.9% ion in Volume: 99.9% alization: None alization: None g: No ning: No Treatment Notes Wound #17 (Left, Proximal, Anterior Lower Leg) 1. Cleanse With Wound Cleanser Soap and water 2. Periwound Care Moisturizing lotion TCA Cream 3. Primary Dressing Applied Calcium Alginate Ag 4. Secondary Dressing ABD Pad 6. Support Layer Applied 4 layer compression wrap Notes netting. Electronic Signature(s) Signed: 01/23/2020 5:43:34 PM By: Carlene Coria RN Entered By: Carlene Coria on 01/23/2020 08:35:39 -------------------------------------------------------------------------------- Wound Assessment Details Patient  Name: Date of Service: RAEGAN, SIPP 01/23/2020 7:30 AM Medical Record [id number] Patient Account Number: [id number] Date of Birth/Sex: Treating RN: [birth year] (43 y.o. [name]) Carlene Coria Primary Care Matheson Vandehei: [name], [name] Other Clinician: Referring Tajha Sammarco: Treating Hung Rhinesmith/Extender:[name], [name], [name] in Treatment: 39 Wound Status Wound Number: [id number] Primary Venous Leg Ulcer Etiology: Wound Location: Left, Anterior Lower Leg Wound Open Wounding Event: Gradually Appeared Status: Date Acquired: 03/31/2019 Comorbid Asthma, Sleep Apnea, Deep Vein Thrombosis, Weeks Of Treatment: 39 History: Phlebitis, Osteoarthritis, Neuropathy Clustered Wound: No Wound Measurements Length: (cm) 2 % Reduct Width: (cm) 1 % Reduct Depth: (cm) 0.1 Epitheli Area: (cm) 1.571 Tunneli Volume: (cm) 0.157 Undermi Wound Description Classification: Full Thickness Without Exposed Support Foul Odo Structures Slough/F Wound Distinct, outline attached Margin: Exudate Medium Amount: Exudate Serosanguineous Type: Exudate red, brown Color: Wound Bed Granulation Amount: Large (67-100%) Granulation Quality: Red Fascia E Necrotic Amount: Small (1-33%) Fat Laye Necrotic Quality: Adherent Slough Tendon  E Muscle E Joint Ex Bone Exp r After Cleansing: No ibrino No Exposed Structure xposed: No r (Subcutaneous Tissue) Exposed: Yes xposed: No xposed: No posed: No osed: No ion in Area: 88.1% ion in Volume: 88.1% alization: Medium (34-66%) ng: No ning: No Treatment Notes Wound #9RR (Left, Anterior Lower Leg) 1. Cleanse With Wound Cleanser Soap and water 2. Periwound Care Moisturizing lotion TCA Cream 3. Primary Dressing Applied Calcium Alginate Ag 4. Secondary Dressing ABD Pad 6. Support Layer Applied 4 layer compression wrap Notes netting. Electronic Signature(s) Signed: 01/23/2020 5:43:34 PM By: Carlene Coria RN Entered By: Carlene Coria on 01/23/2020  08:35:39 -------------------------------------------------------------------------------- Vitals Details Patient Name: Date of Service: OISIN, YOAKUM 01/23/2020 7:30 AM Medical Record [id number] Patient Account Number: [id number] Date of Birth/Sex: Treating RN: [birth year] (43 y.o. [name]) Carlene Coria Primary Care Artesia Berkey: [name], [name] Other Clinician: Referring Jovany Disano: Treating Nashaly Dorantes/Extender:[name], [name], [name] in Treatment: 39 Vital Signs Time Taken: 08:22 Temperature (F): 98.2 Height (in): 74 Pulse (bpm): 76 Weight (lbs): 325 Respiratory Rate (breaths/min): 18 Body Mass Index (BMI): 41.7 Blood Pressure (mmHg): 160/80 Reference Range: 80 - 120 mg / dl Electronic Signature(s) Signed: 01/23/2020 5:43:34 PM By: Carlene Coria RN Entered By: Carlene Coria on 01/23/2020 08:22:36

## 2020-01-30 ENCOUNTER — Encounter (HOSPITAL_BASED_OUTPATIENT_CLINIC_OR_DEPARTMENT_OTHER): Payer: 59 | Attending: Internal Medicine | Admitting: Internal Medicine

## 2020-01-30 ENCOUNTER — Other Ambulatory Visit: Payer: Self-pay

## 2020-01-30 DIAGNOSIS — I890 Lymphedema, not elsewhere classified: Secondary | ICD-10-CM | POA: Diagnosis not present

## 2020-01-30 DIAGNOSIS — D6869 Other thrombophilia: Secondary | ICD-10-CM | POA: Diagnosis not present

## 2020-01-30 DIAGNOSIS — Z7901 Long term (current) use of anticoagulants: Secondary | ICD-10-CM | POA: Insufficient documentation

## 2020-01-30 DIAGNOSIS — Z86718 Personal history of other venous thrombosis and embolism: Secondary | ICD-10-CM | POA: Diagnosis not present

## 2020-01-30 DIAGNOSIS — L97821 Non-pressure chronic ulcer of other part of left lower leg limited to breakdown of skin: Secondary | ICD-10-CM | POA: Insufficient documentation

## 2020-01-30 DIAGNOSIS — Z86711 Personal history of pulmonary embolism: Secondary | ICD-10-CM | POA: Insufficient documentation

## 2020-01-30 DIAGNOSIS — I87332 Chronic venous hypertension (idiopathic) with ulcer and inflammation of left lower extremity: Secondary | ICD-10-CM | POA: Insufficient documentation

## 2020-01-30 NOTE — Progress Notes (Signed)
IVAL, PACER ([id number]) Visit Report for 01/30/2020 Arrival Information Details Patient Name: Date of Service: DRACEN, REIGLE 01/30/2020 7:30 AM Medical Record [id number] Patient Account Number: [id number] Date of Birth/Sex: Treating RN: 1975/11/06 (44 y.o. [name]) [name] Primary Care Patricio Popwell: [name], [name] Other Clinician: Referring Cassondra Stachowski: Treating Kristien Salatino/Extender:[name], [name], [name] in Treatment: 40 Visit Information History Since Last Visit All ordered tests and consults were completed: No Patient Arrived: Ambulatory Added or deleted any medications: No Arrival Time: 07:41 Any new allergies or adverse reactions: No Accompanied By: self Had a fall or experienced change in No Transfer Assistance: None activities of daily living that may affect Patient Identification Verified: Yes risk of falls: Secondary Verification Process Yes Signs or symptoms of abuse/neglect since last No Completed: visito Patient Requires Transmission-Based No Hospitalized since last visit: No Precautions: Implantable device outside of the clinic excluding No Patient Has Alerts: No cellular tissue based products placed in the center since last visit: Has Dressing in Place as Prescribed: Yes Has Compression in Place as Prescribed: Yes Pain Present Now: No Electronic Signature(s) Signed: 01/30/2020 4:56:09 PM By: [name] RN Entered By: [name] on 01/30/2020 07:42:18 -------------------------------------------------------------------------------- Compression Therapy Details Patient Name: Date of Service: MISCHA, POLLARD 01/30/2020 7:30 AM Medical Record [id number] Patient Account Number: [id number] Date of Birth/Sex: Treating RN: [birth year] (43 y.o. [name] Primary Care Davone Shinault: [name], [name] Other Clinician: Referring Taylah Dubiel: Treating Lyndy Russman/Extender:[name], [name], [name] in Treatment: 40 Compression Therapy Performed for  Wound Wound #9RR Left,Anterior Lower Leg Assessment: Performed By: Little Ishikawa, RN Compression Type: Four Layer Electronic Signature(s) Signed: 01/30/2020 4:56:09 PM By: [name] RN Entered By: [name] on 01/30/2020 08:17:35 -------------------------------------------------------------------------------- Encounter Discharge Information Details Patient Name: Date of Service: ARSENIO, SCHNORR 01/30/2020 7:30 AM Medical Record [id number] Patient Account Number: [id number] Date of Birth/Sex: Treating RN: 1976-10-12 (43 y.o. [name] Primary Care Milla Wahlberg: [name], [name] Other Clinician: Referring Sandrine Bloodsworth: Treating Jeanice Dempsey/Extender:[name], [name], [name] in Treatment: 40 Encounter Discharge Information Items Discharge Condition: Stable Ambulatory Status: Ambulatory Discharge Destination: Home Transportation: Private Auto Accompanied By: self Schedule Follow-up Appointment: Yes Clinical Summary of Care: Patient Declined Electronic Signature(s) Signed: 01/30/2020 4:56:09 PM By: [name] RN Entered By: [name] on 01/30/2020 08:19:56 -------------------------------------------------------------------------------- Patient/Caregiver Education Details Patient Name: Date of Service: [name] 4/2/2021andnbsp7:30 AM Medical Record [id number] Patient Account Number: [id number] Date of Birth/Gender: June 01, 1976 (43 y.o. M) Treating RN: [name] Primary Care Physician: [name], [name] Other Clinician: Referring Physician: Treating Physician/Extender:[name], [name], [name] in Treatment: 38 Education Assessment Education Provided To: Patient Education Topics Provided Wound/Skin Impairment: Methods: Explain/Verbal Responses: State content correctly Electronic Signature(s) Signed: 01/30/2020 4:56:09 PM By: [name] RN Entered By: [name] on 01/30/2020  08:19:38 -------------------------------------------------------------------------------- Wound Assessment Details Patient Name: Date of Service: [name], [name] 01/30/2020 7:30 AM Medical Record [id number] Patient Account Number: [id number] Date of Birth/Sex: Treating RN: 27-Nov-1975 (43 y.o. [name] Primary Care Tejasvi Brissett: [name], [name] Other Clinician: Referring Cally Nygard: Treating Lyfe Reihl/Extender:[name], [name], [name] in Treatment: 40 Wound Status Wound Number: [id number] Primary Venous Leg Ulcer Etiology: Wound Location: Left, Proximal, Anterior Lower Leg Secondary Lymphedema Wounding Event: Gradually Appeared Etiology: Date Acquired: 01/16/2020 Wound Open Weeks Of Treatment: 2 Status: Clustered Wound: Yes Comorbid Asthma, Sleep Apnea, Deep Vein History: Thrombosis, Phlebitis, Osteoarthritis, Neuropathy Wound Measurements Length: (cm) 0.1 % Reduct Width: (cm) 0.1 % Reduct Depth: (cm) 0.1 Epitheli Clustered Quantity: 3 Tunnelin Area: (cm) 0.008 Undermi Volume: (cm) 0.001 Wound Description Classification: Full Thickness Without  Exposed Support Foul Od Structures Slough/ Wound Distinct, outline attached Margin: Exudate Medium Amount: Exudate Serosanguineous Type: Exudate red, brown Color: Wound Bed Granulation Amount: Large (67-100%) Granulation Quality: Red Fascia E Necrotic Amount: Small (1-33%) Fat Laye Necrotic Quality: Adherent Slough Tendon Expo Muscle Expo Joint Expos Bone Expose or After Cleansing: No Fibrino No Exposed Structure xposed: No r (Subcutaneous Tissue) Exposed: Yes sed: No sed: No ed: No d: No ion in Area: 99.9% ion in Volume: 99.9% alization: None g: No ning: No Treatment Notes Wound #17 (Left, Proximal, Anterior Lower Leg) 1. Cleanse With Wound Cleanser Soap and water 2. Periwound Care Moisturizing lotion 3. Primary Dressing Applied Calcium Alginate Ag 4. Secondary Dressing Dry Gauze 6.  Support Layer Applied 4 layer compression wrap Notes netting Electronic Signature(s) Signed: 01/30/2020 4:56:09 PM By: Carlene Coria RN Entered By: Carlene Coria on 01/30/2020 07:43:57 -------------------------------------------------------------------------------- Wound Assessment Details Patient Name: Date of Service: DAWAYNE, OHAIR 01/30/2020 7:30 AM Medical Record [id number] Patient Account Number: [id number] Date of Birth/Sex: Treating RN: Mar 23, 1976 (43 y.o. [name]) Carlene Coria Primary Care Vadhir Mcnay: [name], [name] Other Clinician: Referring Cline Draheim: Treating Jonnell Hentges/Extender:[name], [name], [name] in Treatment: 40 Wound Status Wound Number: [id number] Primary Venous Leg Ulcer Etiology: Wound Location: Left, Anterior Lower Leg Wound Open Wounding Event: Gradually Appeared Status: Date Acquired: 03/31/2019 Comorbid Asthma, Sleep Apnea, Deep Vein Weeks Of Treatment: 40 History: Thrombosis, Phlebitis, Osteoarthritis, Clustered Wound: No Neuropathy Wound Measurements Length: (cm) 2 % Reduction Width: (cm) 1 % Reduction Depth: (cm) 0.1 Epitheliali Area: (cm) 1.571 Tunneling: Volume: (cm) 0.157 Underminin Wound Description Full Thickness Without Exposed Support Foul Classification: Structures Slou Wound Distinct, outline attached Margin: Exudate Medium Amount: Exudate Serosanguineous Type: Exudate red, brown Color: Wound Bed Granulation Amount: Large (67-100%) Granulation Quality: Red Fasci Necrotic Amount: Small (1-33%) Fat L Necrotic Quality: Adherent Slough Tendo Muscl Joint Bone Odor After Cleansing: No gh/Fibrino No Exposed Structure a Exposed: No ayer (Subcutaneous Tissue) Exposed: Yes n Exposed: No e Exposed: No Exposed: No Exposed: No in Area: 88.1% in Volume: 88.1% zation: Medium (34-66%) No g: No Treatment Notes Wound #9RR (Left, Anterior Lower Leg) 1. Cleanse With Wound Cleanser Soap and water 2. Periwound  Care Moisturizing lotion 3. Primary Dressing Applied Calcium Alginate Ag 4. Secondary Dressing Dry Gauze 6. Support Layer Applied 4 layer compression wrap Notes netting Electronic Signature(s) Signed: 01/30/2020 4:56:09 PM By: Carlene Coria RN Entered By: Carlene Coria on 01/30/2020 07:44:20 -------------------------------------------------------------------------------- [place] Details Patient Name: Date of Service: ISAEL, STILLE 01/30/2020 7:30 AM Medical Record [id number] Patient Account Number: [id number] Date of Birth/Sex: Treating RN: [birth year] (43 y.o. [name]) Carlene Coria Primary Care Sheryl Saintil: [name], [name] Other Clinician: Referring Bellamy Judson: Treating Jera Headings/Extender:[name], [name], [name] in Treatment: 40 Vital Signs Time Taken: 07:42 Temperature (F): 98.6 Height (in): 74 Pulse (bpm): 81 Weight (lbs): 325 Respiratory Rate (breaths/min): 18 Body Mass Index (BMI): 41.7 Blood Pressure (mmHg): 178/86 Reference Range: 80 - 120 mg / dl Electronic Signature(s) Signed: 01/30/2020 4:56:09 PM By: Carlene Coria RN Entered By: Carlene Coria on 01/30/2020 07:43:02

## 2020-02-02 NOTE — Progress Notes (Signed)
[name], [name] ([id number]) Visit Report for 01/30/2020 [place] Details Patient Name: Date of Service: [name], [name] 01/30/2020 Medical Record [id number] Patient Account Number: [id number] Date of Birth/Sex: Treating RN: August 14, 1976 (43 y.o. [name]) [name] Primary Care Provider: [name], [name] Other Clinician: Referring Provider: Treating Provider/Extender:[name], [name], [name] in Treatment: 40 Diagnosis Coding ICD-10 Codes Code Description L97.821 Non-pressure chronic ulcer of other part of left lower leg limited to breakdown of skin I87.322 Chronic venous hypertension (idiopathic) with inflammation of left lower extremity L03.116 Cellulitis of left lower limb I89.0 Lymphedema, not elsewhere classified D68.69 Other thrombophilia Facility Procedures CPT4 Code Description Modifier Quantity [id number] (Facility Use Only) [phone] - APPLY MULTLAY COMPRS LWR LT LEG 1 Electronic Signature(s) Signed: 01/30/2020 4:56:09 PM By: [name] RN Signed: 02/02/2020 7:44:34 AM By: Baltazar Najjar MD Entered By: [name] on 01/30/2020 08:20:12

## 2020-02-06 ENCOUNTER — Encounter (HOSPITAL_BASED_OUTPATIENT_CLINIC_OR_DEPARTMENT_OTHER): Payer: 59 | Admitting: Internal Medicine

## 2020-02-06 ENCOUNTER — Other Ambulatory Visit: Payer: Self-pay

## 2020-02-06 DIAGNOSIS — I87332 Chronic venous hypertension (idiopathic) with ulcer and inflammation of left lower extremity: Secondary | ICD-10-CM | POA: Diagnosis not present

## 2020-02-09 NOTE — Progress Notes (Signed)
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 on the left anterior mid tibia area. He has been applying silver collagen to this. When he left our clinic we ordered him 30-40 over the toe stockings. He states his edema is under as good control in this leg as he is ever seen it. Over the last 2 days he has developed an area on the left medial ankle/foot. This is the area that we worked on last time they cultured staph I gave him antibiotics and this closed over. The patient has a history of pulmonary embolism with an IVC filter. I think he has chronic clot in the deep system is thigh  although I'll need to recheck this. He is on chronic anticoagulation. 7/2; the area on the left mid tibia  did not have a viable surface today somewhat surprising adherent black necrotic surface. Not even really eschared. No evidence of infection. We did silver alginate last week 7/9; left mid tibia somewhat improved surface and slightly smaller. Using Iodoflex. 7/16 left mid tibia. Continues to be slightly smaller with improved surface using Iodoflex. He did not see Dr. Trula Ore with regards to the CT scan on the right leg because his insurance did not approve it 7/23; left mid tibia. No change in surface area however the wound surface looks better. He has another small area superiorly which is a small hyper granulated lesion. But this is of unclear etiology however it is defined is new this week. He also had significant bleeding reported by our intake nurse the exact source of this was unclear. 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 His original wound looks quite a bit better and how it every he is developed over the last 3 weeks a raised painful area just above the wound. I am not sure if this represents a primary cutaneous issue or a venous issue. He seems to have a palpable vein underneath this which is tender may represent superficial venous thrombosis. He is already on Xarelto. 8/6-Patient returns at 1 week for his left mid tibial wounds, the more proximal of the 2 wounds was raised painful area described last time, patient states this is less painful than before, he is almost completed his doxycycline, the wound area is larger but the wound itself is less painful. 8/13-Left anterior leg wound looks smaller and overall making good progress the other wound has healed. We are using Hydrofera Blue 8/20; the patient has 2 open areas. The original inferior area and the new area superiorly. He has been  using Hydrofera Blue ready but the wounds overall look dry. He has not managed to get a CT scan approved and hence has not seen Dr. Trula Ore at [facility] 8/27 most of the patient's wound areas on the anterior look epithelialized. There is still some eschar and vulnerability here. He has been wearing to press stockings which should give him 30/40 mmHg compression. He also has compression pumps that belonged to his father. I think it would be reasonable to try and get him external compression pumps. He was not using the compression pumps daily and these certainly need to be used daily. Finally he was approved for his CT scan on the right with follow-up with Dr. Trula Ore. We will asked Dr. Trula Ore to look at his left leg as well 9/3; the patient had 2 wound areas. Central tibial area of area is healed. Smaller wound superiorly still perhaps slightly open. I believe his CT scan on the right hip and pelvis areas on the 18th. That was ordered by Dr. Trula Ore. We have ordered him 30/40 stockings and are going to attempt to get him compression pumps 9/10-Patient returns at 1 week for the 2 small areas on his left anterior leg, these areas are healed. Patient is now going to going to the 30/40 stockings. His CT scan of his right leg hip and pelvis areas is scheduled 9/22 the patient was discharged from the clinic on 9/10. He tells me that he started developing pain on the left anterior tibial area and swelling on Thursday of last week. By Saturday the swelling was so prominent that he was scared to take his stocking off because he would be able to get it back on.  There was increased pain. He was seen in the emergency department at [facility] on 9/1. 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 He was also felt to have cellulitis. There was apparently not any acute clot. He I he  was put on a 3 time a day medication/antibiotic which I am assuming is clindamycin. He has 2 reopened areas both on the anterior tibia 9/29; he completed the clindamycin even though it caused diarrhea. The diarrhea is resolved. The cellulitis in the left leg that was prominent last week has resolved. 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 I looked in [place] link I do not see the actual CT scan report. He is being apparently scheduled for an attempt at stenting retrograde and then if that does not work anterior grade by Dr. Trula Ore. We use silver alginate to the wound last week because of coexistent cellulitis 10/6; cellulitis on the left leg resolved. He is still waiting for insurance authorization for his pelvic vein stenting. Changed him to [facility] last week 10/13; cellulitis remains resolved. Wound is smaller. Using Hydrofera Blue under compression. In 2 days time he is going for his venous stenting hopefully by Dr. Trula Ore at [facility] 10/20; the patient went for his procedure last Thursday by Dr. Trula Ore although he does not remember in the postop period what he was told. He is not certain whether Dr. Trula Ore managed to get the stent then successfully. His wound is measuring smaller looks healthy on the left mid tibia. He is also having some discomfort behind his left knee that he had me look at which is the site where Dr. Trula Ore had venous access. I looked in care everywhere. I could not see a specific note from Dr. Trula Ore [[facility]] above the actual procedure. Presumably it is still in transfer therefore I could not really answer his question about whether the stent was placed successful 10/27; wound not quite as good as last week. Using Hydrofera Blue. He thinks it might of stuck to the wound and was adherent when they took it off  today. His procedure with Dr. Trula Ore was not successful. He thinks that Dr. Trula Ore will try to go at this from the other direction at some point. He sees him in 2-1/2-week 11/3; quite an improvement in wound surface area this week. We are using Hydrofera Blue. No need to change the dressing. Follows up with Dr. Trula Ore in about a week and a half 11/10; wound surface not as good this week at all. No changes in dimensions. We have been using Hydrofera Blue. As well he had tells [place] he had discomfort in his foot all week although he admits he was up on this more than usual. He comes in today with 2 small punched out areas on the left lateral foot. He has severe venous hypertension. He sees Dr. Trula Ore again on 11/16 11/17; not much change in the area on the anterior tibia mid aspect. He also has 2 areas on the left lateral foot. We have been using Sorbact on the left anterior and Iodosorb ointment on the left foot. He is under 4-layer compression. The patient went to see Dr. Trula Ore I do not yet have  access to this note and care everywhere. However according to the patient there is an option to try and address his venous occlusion I think via a transjugular approach. This is complicated by the fact that the IVC filter is there and would have to be traversed. According to the patient there is about a 33% chance of perforation may be even aortic perforation. However they would be prepared for this. He is thinking about this and discussing it with his wife Thanks to our case manager this week we are able to determine if for some reason the patient is not using his compression pumps. He also is not able to get stockings on the right leg which are 30/40 below-knee. I think we may need to order external compression garments 12/1; patient arrives with the area on the left anterior mid tibia expanded superiorly small rim of skin between the original area and the new area. He has painful small denuded areas  on the left medial and left lateral heel. We have been using Iodoflex. His next appointment with the Dr. Trula Ore is January 4. after the holidays to discuss if he would like to pursue this endovascular option. Dr. Sherrin Daisy notes below from last visit Subjective [name] is a 44 y.o. 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 Unfortunately during the procedure a stent was not able to be placed d/t narrowing at the IVC. Per the patient the procedure provided 2-3 days of symptomatic relief (decrease in L LE pressure and pain) but after that his symptoms returned with increased pressure, pain in the hips and pain in the legs when standing. The patient does report his L LE wound is stable especially when he is consistently attending his wound care clinic in [place]. The patient denies using compressive LE therapies unless done at his wound clinic due to not being able to reach his legs. He is interested in learning more and possibly pursing further interventional options for his L lower extremity via access through neck. *Objective Physical Exam: BP 171/86  Pulse 80  Temp 37 C (Temporal)  Wt (!) 147.4 kg (325 lb)  BMI 41.73 kg/m General: awake, alert and oriented x 3 Chest: Non labored breathing on room air CVS: Normal rate. Regular rhythm. ABD: soft, NT, ND Ext: bilateral leg edema w/ left leg in compressive dressing from wound clinic. Evidence of skin discoloration on both feet bilaterally. Data Review: Labs: None to review Imaging:Reviewed report of 10/15 L LE venogram. I saw and evaluated the patient, participating in the key portions of the service. I reviewed the residentoos note. I agree with the residentoos findings and plan. Electronically  signed by Derry Skill, MD at 09/16/2019 10:53 AM EST 12/11 the areas around his ankles have healed. 2 areas in the center part of the mid tibia area are still open we have been using silver collagen. Procedure attempt by Dr. Trula Ore at [facility] on 11/03/2019 12/18; the wounds around his ankles remain closed. I think there has been some improvement in the 2 areas in the center part of the mid tibia. Especially superiorly. Debris on the center required debridement we have been using silver collagen 12/31; I saw the patient briefly last week when he was here for a nurse visit. He had irritation in  which look believed to be localized cellulitis and folliculitis on the lateral left calf. I gave him a prescription for cephalexin although he admits today he never filled it. The area still looked very angry. His original wounds we have been treating where a figure-of-eight shaped area on the anterior tibial area mid aspect. We have been using silver collagen these do not look healthy. 11/07/2019. The patient has completed his antibiotics. The area of folliculitis on the left lateral calf looks somewhat better although there is still erythema there is no tenderness. I am going to put some more compression on this area and will have a look at this next week. No additional antibiotics. Since the patient was last here he went to see Dr. Trula Ore at [facility]. I am able to see his note from 11/03/2019 in care everywhere. The patient has a history of a right iliac vein stent and IVC filter placement. He is status post angioplasty of the left common iliac vein on 08/14/2019 for chronic obstruction and venous insufficiency. Unfortunately a stent could not be placed during the procedure due to narrowing at the IVC from fibrotic change of the right iliac vein stent. As I understand things they are now going to approach this from both sides i.e. through an area in the internal jugular vein as well as the more standard  distal approach and see if this area can be stented. The thought was 5 to 6 weeks before this could be arranged as it will "require 2 surgeons, insurance approval etc. The original wound on his anterior tibia looks better. He has the area on the left lateral tibia with localized swelling. He has a new area on the left medial calcaneus which is small punched out and very painful. I think this looks similar to wounds he has had in this area before. He has severe venous hypertension 1/15; anterior tibia wound has come down from a figure-of-eight to 1 wound. Has some depth which looks clean. The area on the left lateral calf looks better. We put additional compression in here and the edema in this area looks better. His major complaint is on the medial calcaneus. He had mentioned this last week and he has had wounds like this before. Pain is severe 1/22; anterior tibial wound seems to be closing at with shallower and better looking wound margin. He has an area anteriorly that I think was initially an area of folliculitis the area on the left lateral calf has closed over The small area on the medial heel that I cultured last week grew methicillin sensitive staph aureus but resistant to the doxycycline I gave him. I substituted cephalexin 500 every 6 and has only been on this for 2 days. Still says the pain is very significant 1/29; all the patient's wounds look quite good today including the anterior tibial which is closing in. The area above this also closing in finally the very painful area on the medial heel. He is completing antibiotics today this cultured methicillin sensitive staph aureus. Back to using silver alginate on the wounds 2/12; in general things are quite good here. He still does not have a date for attempting to stand his left common iliac vein. Still an insurance issue. I have been encouraged him to move on this by calling his insurance company. There is not a good option  here. 2/26; continued nice improvement. He still has the open area in the mid tibia that is incompletely closed. Surprisingly today the difficult area on  his left medial calcaneus that was so painful and inflamed has epithelialized over. 3/5; the patient has his stenting procedure attempt at [facility] on 3/25. He continues to make nice progress the only remaining wound is on the left anterior tibial area. His heel medially remains epithelialized. We have been using Sorbac 3/19; his stenting procedure is actually on 4/15 per the patient today. Unfortunately he was not here last week and did not use his pumps. He has an area of localized edema erythema and marked tenderness above where the patient claims the original wound was. To be truthful we all had trouble determining what was new wound today and what was original wound. Patient thought it was the more distal medial area where is the more central area is what my nurse and I thought it was. In any case looking back on the pictures really did not clarify things. He has a small punched out area just medial to the tibia which the patient claims was his original wound superiorly he now has an open area which is superficial some satellite lesions very significant erythema and tenderness more centrally and superiorly to what is supposedly his original wound. A lot of tenderness here. We have been using Sorbact I changed to silver alginate today. He will need antibiotic 3/22; back early for review of probable cellulitis in the mid tibia area. This also could have been localized stasis dermatitis from not changing his wrap and not using compression. We put him on doxycycline empirically things look better he is in less pain no systemic illness 3/26; in for his usual appointment. The cellulitis/localized stasis dermatitis we are concerned about a week ago has resolved. He is completing the doxycycline. I really think this was localized poor edema control  rather than cellulitis although these things are difficult to be certain. Will use silver alginate on the remaining wound which looks healthy on the left anterior mid tibia 4/8; the cellulitis stasis dermatitis week concerned about 2 weeks ago is still resolved. His original wound in the mid tibia area is remains healed the area we are dealing with current currently is above this area by about 3 or 4 cm. Clean looking wound. No need for debridement. We have been using silver alginate The patient's attempt to recanalize his left common iliac vein is next Thursday. This is to be done at [facility] by Dr. Trula Ore and colleagues my understanding is that they will approach this from both sides Objective Constitutional Patient is hypertensive.. Pulse regular and within target range for patient.Marland Kitchen Respirations regular, non-labored and within target range.. Temperature is normal and within the target range for the patient.Marland Kitchen Appears in no distress. Vitals Time Taken: 7:50 AM, Height: 74 in, Weight: 325 lbs, BMI: 41.7, Temperature: 98.7 F, Pulse: 93 bpm, Respiratory Rate: 18 breaths/min, Blood Pressure: 164/90 mmHg. Cardiovascular Needle pulses are palpable. Edema control is adequate.. General Notes: Wound exam; area in question is now on the left mid tibia. This is a new open wound not the one we have been dealing with most recently. There is no evidence of infection. Integumentary (Hair, Skin) No evidence of cellulitis. Changes of severe chronic venous disease. Wound #17 status is Healed - Epithelialized. Original cause of wound was Gradually Appeared. The wound is located on the Left,Proximal,Anterior Lower Leg. The wound measures 0cm length x 0cm width x 0cm depth; 0cm^2 area and 0cm^3 volume. There is no tunneling or undermining noted. There is a none present amount of drainage noted. The wound margin  is distinct with the outline attached to the wound base. There is no granulation within the wound bed.  There is no necrotic tissue within the wound bed. Wound #9RR status is Open. Original cause of wound was Gradually Appeared. The wound is located on the Left,Anterior Lower Leg. The wound measures 2.5cm length x 0.8cm width x 0.1cm depth; 1.571cm^2 area and 0.157cm^3 volume. There is Fat Layer (Subcutaneous Tissue) Exposed exposed. There is no tunneling or undermining noted. There is a medium amount of serosanguineous drainage noted. The wound margin is distinct with the outline attached to the wound base. There is large (67-100%) red granulation within the wound bed. There is no necrotic tissue within the wound bed. Assessment Active Problems ICD-10 Non-pressure chronic ulcer of other part of left lower leg limited to breakdown of skin Chronic venous hypertension (idiopathic) with inflammation of left lower extremity Cellulitis of left lower limb Lymphedema, not elsewhere classified Other thrombophilia Procedures Wound #9RR Pre-procedure diagnosis of Wound #9RR is a Venous Leg Ulcer located on the Left,Anterior Lower Leg . There was a Four Layer Compression Therapy Procedure by Shawn Stall, RN. Post procedure Diagnosis Wound #9RR: Same as Pre-Procedure Plan Follow-up Appointments: Return Appointment in 1 week. Dressing Change Frequency: Do not change entire dressing for one week. Skin Barriers/Peri-Wound Care: Moisturizing lotion TCA Cream or Ointment - mixed with lotion Wound Cleansing: May shower with protection. Primary Wound Dressing: Wound #9RR Left,Anterior Lower Leg: Calcium Alginate with Silver Secondary Dressing: Wound #9RR Left,Anterior Lower Leg: Dry Gauze ABD pad - extra ABD pad to lateral leg for increased compression Edema Control: 4 layer compression: Left lower extremity - Add extra folded ABD pad to lateral leg for additional compression Avoid standing for long periods of time Elevate legs to the level of the heart or above for 30 minutes daily and/or when  sitting, a frequency of: - throughout the day Support Garment 30-40 mm/Hg pressure to: - right leg : apply compression stockings dual layer in the am and remove at night. Segmental Compressive Device. - lymphedema pumps twice a day for 1 hour each time 1. Continue with silver alginate ABDs under 4-layer compression 2. In view of the fact that his procedure on the left common iliac vein is next Thursday I have asked him to call us afterwards to let us know how we can help in redressing this area. I am assuming he will have a fairly quick follow- up after the procedure by Dr. Trula Ore perhaps next week whether the procedure is successful or not. The goal is I think this stent the left common iliac vein Electronic Signature(s) Signed: 02/09/2020 9:07:11 AM By: Baltazar Najjar MD Entered By: Baltazar Najjar on 02/06/2020 08:52:44 -------------------------------------------------------------------------------- SuperBill Details Patient Name: Date of Service: KAYVEON, LENNARTZ 02/06/2020 Medical Record [id number] Patient Account Number: [id number] Date of Birth/Sex: Treating RN: Aug 04, 1976 (44 y.o. [name] Primary Care Provider: [name], [name] Other Clinician: Referring Provider: Treating Provider/Extender:Baili Stang, [name], [name] in Treatment: 41 Diagnosis Coding ICD-10 Codes Code Description L97.821 Non-pressure chronic ulcer of other part of left lower leg limited to breakdown of skin I87.322 Chronic venous hypertension (idiopathic) with inflammation of left lower extremity L03.116 Cellulitis of left lower limb I89.0 Lymphedema, not elsewhere classified D68.69 Other thrombophilia Facility Procedures Physician Procedures CPT4 Code Description: [id number] - WC PHYS LEVEL 3 - EST PT ICD-10 Diagnosis Description L97.821 Non-pressure chronic ulcer of other part of left lower l skin I87.322 Chronic venous hypertension (idiopathic) with inflammati Modifier: eg  limited  to b on of left lowe Quantity: 1 reakdown of r extremity Electronic Signature(s) Signed: 02/09/2020 9:07:11 AM By: Baltazar Najjar MD Entered By: Baltazar Najjar on 02/06/2020 08:52:59

## 2020-02-09 NOTE — Progress Notes (Signed)
[name], [name] ([id number]) Visit Report for 02/06/2020 Arrival Information Details Patient Name: Date of Service: [name], [name] 02/06/2020 7:30 AM Medical Record [id number] Patient Account Number: [id number] Date of Birth/Sex: Treating RN: 07-Feb-1976 (44 y.o. [name] Primary Care Deval Mroczka: [name], [name] Other Clinician: Referring Rayland Hamed: Treating Leron Stoffers/Extender:[name], [name], [name] in Treatment: 40 Visit Information History Since Last Visit Added or deleted any medications: No Patient Arrived: Ambulatory Any new allergies or adverse reactions: No Arrival Time: 07:46 Had a fall or experienced change in No Accompanied By: alone activities of daily living that may affect Transfer Assistance: None risk of falls: Patient Identification Verified: Yes Signs or symptoms of abuse/neglect since last No Secondary Verification Process Completed: Yes visito Patient Requires Transmission-Based No Hospitalized since last visit: No Precautions: Implantable device outside of the clinic excluding No Patient Has Alerts: No cellular tissue based products placed in the center since last visit: Has Dressing in Place as Prescribed: Yes Has Compression in Place as Prescribed: Yes Pain Present Now: No Electronic Signature(s) Signed: 02/09/2020 6:12:10 PM By: [name] RN, BSN Entered By: [name] on 02/06/2020 07:47:36 -------------------------------------------------------------------------------- Compression Therapy Details Patient Name: Date of Service: [name], [name] 02/06/2020 7:30 AM Medical Record [id number] Patient Account Number: [id number] Date of Birth/Sex: Treating RN: 04-23-1976 (44 y.o. [name] Primary Care Carisha Kantor: [name], [name] Other Clinician: Referring Lilac Hoff: Treating Alicha Raspberry/Extender:[name], [name], [name] in Treatment: 71 Compression Therapy Performed for Wound Wound #9RR Left,Anterior Lower  Leg Assessment: Performed By: Clinician [name] Pilling, RN Compression Type: Four Layer Post Procedure Diagnosis Same as Pre-procedure Electronic Signature(s) Signed: 02/06/2020 6:04:52 PM By: Kela Millin Entered By: Kela Millin on 02/06/2020 08:21:30 -------------------------------------------------------------------------------- Encounter Discharge Information Details Patient Name: Date of Service: [name], [name] 02/06/2020 7:30 AM Medical Record [id number] Patient Account Number: [id number] Date of Birth/Sex: Treating RN: 1975/11/02 (44 y.o. [name] Primary Care Mariene Dickerman: [name], [name] Other Clinician: Referring Kariya Lavergne: Treating Florencio Hollibaugh/Extender:[name], [name], [name] in Treatment: 37 Encounter Discharge Information Items Discharge Condition: Stable Ambulatory Status: Ambulatory Discharge Destination: Home Transportation: Private Auto Accompanied By: self Schedule Follow-up Appointment: Yes Clinical Summary of Care: Electronic Signature(s) Signed: 02/06/2020 5:55:58 PM By: [name] Entered By: [name] on 02/06/2020 08:37:54 -------------------------------------------------------------------------------- Lower Extremity Assessment Details Patient Name: Date of Service: [name], [name] 02/06/2020 7:30 AM Medical Record [id number] Patient Account Number: [id number] Date of Birth/Sex: Treating RN: [birth year] (44 y.o. [name] Primary Care Vianka Ertel: [name], [name] Other Clinician: Referring Lucky Trotta: Treating Taci Sterling/Extender:[name], [name], [name] in Treatment: 41 Edema Assessment Assessed: [Left: No] [Right: No] Edema: [Left: Ye] [Right: s] Calf Left: Right: Point of Measurement: 35 cm From Medial Instep 44 cm cm Ankle Left: Right: Point of Measurement: 11.5 cm From Medial Instep 23.4 cm cm Vascular Assessment Pulses: Dorsalis Pedis Palpable: [Left:Yes] Electronic Signature(s) Signed:  02/09/2020 6:12:10 PM By: [name] RN, BSN Entered By: [name] on 02/06/2020 07:52:52 -------------------------------------------------------------------------------- Multi Wound Chart Details Patient Name: Date of Service: [name], [name] 02/06/2020 7:30 AM Medical Record [id number] Patient Account Number: [id number] Date of Birth/Sex: Treating RN: [birth year] (44 y.o. [name] Primary Care Sharra Cayabyab: [name], [name] Other Clinician: Referring Karyss Frese: Treating Telissa Palmisano/Extender:[name], [name], [name] in Treatment: 18 Vital Signs Height(in): 74 Pulse(bpm): 93 Weight(lbs): 325 Blood Pressure(mmHg): 164/90 Body Mass Index(BMI): 42 Temperature(F): 98.7 Respiratory 18 Rate(breaths/min): Photos: [17:No Photos] [9RR:No Photos] [N/A:N/A] Wound Location: [17:Left, Proximal, Anterior Lower Leg] [9RR:Left, Anterior Lower Leg] [N/A:N/A] Wounding Event: [17:Gradually Appeared] [9RR:Gradually Appeared] [N/A:N/A] Primary Etiology: [17:Venous Leg Ulcer] [9RR:Venous Leg  Ulcer] [N/A:N/A] Secondary Etiology: [17:Lymphedema] [9RR:N/A] [N/A:N/A] Comorbid History: [17:Asthma, Sleep Apnea, DeepAsthma, Sleep Apnea, DeepN/A Vein Thrombosis, Phlebitis, Vein Thrombosis, Phlebitis, Osteoarthritis, Neuropathy Osteoarthritis, Neuropathy] Date Acquired: [17:01/16/2020] [9RR:03/31/2019] [N/A:N/A] Weeks of Treatment: [17:3] [9RR:41] [N/A:N/A] Wound Status: [17:Healed - Epithelialized] [9RR:Open] [N/A:N/A] Wound Recurrence: [17:No] [9RR:Yes] [N/A:N/A] Clustered Wound: [17:Yes] [9RR:No] [N/A:N/A] Clustered Quantity: [17:3] [9RR:N/A] [N/A:N/A] Measurements L x W x D 0x0x0 [9RR:2.5x0.8x0.1] [N/A:N/A] (cm) Area (cm) : [17:0] [9RR:1.571] [N/A:N/A] Volume (cm) : [17:0] [9RR:0.157] [N/A:N/A] % Reduction in Area: [17:100.00%] [9RR:88.10%] [N/A:N/A] % Reduction in Volume: [17:100.00%] [9RR:88.10%] [N/A:N/A] Classification: [17:Full Thickness Without Exposed Support Structures  Exposed Support Structures] [9RR:Full Thickness Without] [N/A:N/A] Exudate Amount: [17:None Present] [9RR:Medium] [N/A:N/A] Exudate Type: [17:N/A] [9RR:Serosanguineous] [N/A:N/A] Exudate Color: [17:N/A] [9RR:red, brown] [N/A:N/A] Wound Margin: [17:Distinct, outline attached Distinct, outline attached N/A] Granulation Amount: [17:None Present (0%)] [9RR:Large (67-100%)] [N/A:N/A] Granulation Quality: [17:N/A] [9RR:Red] [N/A:N/A] Necrotic Amount: [17:None Present (0%)] [9RR:None Present (0%)] [N/A:N/A] Exposed Structures: [17:Fascia: No Fat Layer (Subcutaneous Tissue) Exposed: Yes Tissue) Exposed: No Tendon: No Muscle: No Joint: No Bone: No] [9RR:Fat Layer (Subcutaneous N/A Fascia: No Tendon: No Muscle: No Joint: No Bone: No] Epithelialization: [17:Large (67-100%) N/A] [9RR:Medium (34-66%) Compression Therapy] [N/A:N/A N/A] Treatment Notes Wound #9RR (Left, Anterior Lower Leg) 1. Cleanse With Wound Cleanser Soap and water 2. Periwound Care Moisturizing lotion TCA Cream 3. Primary Dressing Applied Calcium Alginate Ag 4. Secondary Dressing ABD Pad Dry Gauze 6. Support Layer Applied 4 layer compression wrap Electronic Signature(s) Signed: 02/06/2020 6:04:52 PM By: Kela Millin Signed: 02/09/2020 9:07:11 AM By: Linton Ham MD Entered By: Linton Ham on 02/06/2020 08:48:39 -------------------------------------------------------------------------------- Multi-Disciplinary Care Plan Details Patient Name: Date of Service: [name], [name] 02/06/2020 7:30 AM Medical Record [id number] Patient Account Number: [id number] Date of Birth/Sex: Treating RN: 12-08-1975 (44 y.o. [name] Primary Care Pleshette Tomasini: [name], [name] Other Clinician: Referring Seve Monette: Treating Dray Dente/Extender:[name], [name], [name] in Treatment: 67 Active Inactive Venous Leg Ulcer Nursing Diagnoses: Actual venous Insuffiency (use after diagnosis is confirmed) Knowledge deficit  related to disease process and management Goals: Patient will maintain optimal edema control Date Initiated: 11/14/2019 Target Resolution Date: 02/13/2020 Goal Status: Active Interventions: Assess peripheral edema status every visit. Compression as ordered Treatment Activities: Therapeutic compression applied : 11/14/2019 Notes: Wound/Skin Impairment Nursing Diagnoses: Knowledge deficit related to ulceration/compromised skin integrity Goals: Patient/caregiver will verbalize understanding of skin care regimen Date Initiated: 07/22/2019 Target Resolution Date: 02/13/2020 Goal Status: Active Ulcer/skin breakdown will have a volume reduction of 30% by week 4 Target Resolution Date Initiated: 07/22/2019 Date Inactivated: 08/26/2019 Date: 08/22/2019 Goal Status: Met Ulcer/skin breakdown will have a volume reduction of 50% by week 8 Target Resolution Date Initiated: 08/26/2019 Date Inactivated: 09/30/2019 Date: 09/19/2019 Goal Status: Unmet Unmet Reason: comorbities Ulcer/skin breakdown will have a volume reduction of 80% by week 12 Date Initiated: 09/30/2019 Target Resolution Date: 02/13/2020 Goal Status: Active Interventions: Assess patient/caregiver ability to obtain necessary supplies Assess patient/caregiver ability to perform ulcer/skin care regimen upon admission and as needed Assess ulceration(s) every visit Notes: Electronic Signature(s) Signed: 02/06/2020 6:04:52 PM By: Kela Millin Entered By: Kela Millin on 02/06/2020 07:50:13 -------------------------------------------------------------------------------- Pain Assessment Details Patient Name: Date of Service: [name], [name] 02/06/2020 7:30 AM Medical Record [id number] Patient Account Number: [id number] Date of Birth/Sex: Treating RN: 1976/10/08 (44 y.o. [name] Primary Care Hermie Reagor: [name], [name] Other Clinician: Referring Karan Ramnauth: Treating Clancey Welton/Extender:[name], [name],  [name] in Treatment: 25 Active Problems Location of Pain Severity and Description of Pain Patient Has Paino No Site Locations Pain Management and Medication  Current Pain Management: Electronic Signature(s) Signed: 02/09/2020 6:12:10 PM By: [name] RN, BSN Entered By: [name] on 02/06/2020 07:52:21 -------------------------------------------------------------------------------- Patient/Caregiver Education Details Patient Name: Date of Service: [name], [name] 4/9/2021andnbsp7:30 AM Medical Record [id number] Patient Account Number: [id number] Date of Birth/Gender: 1976-06-09 (43 y.o. M) Treating RN: Kela Millin Primary Care Physician: [name], [name] Other Clinician: Referring Physician: Treating Physician/Extender:[name], [name], [name] in Treatment: 35 Education Assessment Education Provided To: Patient Education Topics Provided Wound/Skin Impairment: Methods: Explain/Verbal Responses: State content correctly Electronic Signature(s) Signed: 02/06/2020 6:04:52 PM By: Kela Millin Entered By: Kela Millin on 02/06/2020 07:50:25 -------------------------------------------------------------------------------- Wound Assessment Details Patient Name: Date of Service: [name], [name] 02/06/2020 7:30 AM Medical Record [id number] Patient Account Number: [id number] Date of Birth/Sex: Treating RN: [birth year] (44 y.o. [name] Primary Care Shanasia Ibrahim: [name], [name] Other Clinician: Referring Francena Zender: Treating Ady Heimann/Extender:[name], [name], [name] in Treatment: 41 Wound Status Wound Number: 17 Primary Venous Leg Ulcer Etiology: Wound Location: Left, Proximal, Anterior Lower Leg Secondary Lymphedema Wounding Event: Gradually Appeared Etiology: Date Acquired: 01/16/2020 Wound Healed - Epithelialized Weeks Of Treatment: 3 Status: Clustered Wound: Yes Comorbid Asthma, Sleep Apnea, Deep Vein History:  Thrombosis, Phlebitis, Osteoarthritis, Neuropathy Wound Measurements Length: (cm) 0 % Reduct Width: (cm) 0 % Reduct Depth: (cm) 0 Epitheli Clustered Quantity: 3 Tunnelin Area: (cm) 0 Undermi Volume: (cm) 0 Wound Description Classification: Full Thickness Without Exposed Support Foul Odo Structures Slough/F Wound Distinct, outline attached Margin: Exudate None Present Amount: Wound Bed Granulation Amount: None Present (0%) Necrotic Amount: None Present (0%) Fascia E Fat Laye Tendon E Muscle E Joint Ex Bone Exp r After Cleansing: No ibrino No Exposed Structure xposed: No r (Subcutaneous Tissue) Exposed: No xposed: No xposed: No posed: No osed: No ion in Area: 100% ion in Volume: 100% alization: Large (67-100%) g: No ning: No Electronic Signature(s) Signed: 02/09/2020 6:12:10 PM By: [name] RN, BSN Entered By: [name] on 02/06/2020 07:59:09 -------------------------------------------------------------------------------- Wound Assessment Details Patient Name: Date of Service: [name], [name] 02/06/2020 7:30 AM Medical Record [id number] Patient Account Number: [id number] Date of Birth/Sex: Treating RN: [birth year] (44 y.o. [name] Primary Care Vernel Donlan: [name], [name] Other Clinician: Referring Johnice Riebe: Treating Willam Munford/Extender:[name], [name], [name] in Treatment: 41 Wound Status Wound Number: [id number] Primary Venous Leg Ulcer Etiology: Wound Location: Left, Anterior Lower Leg Wound Open Wounding Event: Gradually Appeared Status: Date Acquired: 03/31/2019 Comorbid Asthma, Sleep Apnea, Deep Vein Thrombosis, Weeks Of Treatment: 41 History: Phlebitis, Osteoarthritis, Neuropathy Clustered Wound: No Wound Measurements Length: (cm) 2.5 % Reduct Width: (cm) 0.8 % Reduct Depth: (cm) 0.1 Epitheli Area: (cm) 1.571 Tunneli Volume: (cm) 0.157 Undermi Wound Description Full Thickness Without Exposed Support Foul  Odo Classification: Structures Slough/F Wound Distinct, outline attached Margin: Exudate Medium Amount: Exudate Serosanguineous Type: Exudate red, brown Color: Wound Bed Granulation Amount: Large (67-100%) Granulation Quality: Red Fascia E Necrotic Amount: None Present (0%) Fat Laye Tendon Expo Muscle Expo Joint Expos Bone Expose r After Cleansing: No ibrino No Exposed Structure xposed: No r (Subcutaneous Tissue) Exposed: Yes sed: No sed: No ed: No d: No ion in Area: 88.1% ion in Volume: 88.1% alization: Medium (34-66%) ng: No ning: No Treatment Notes Wound #9RR (Left, Anterior Lower Leg) 1. Cleanse With Wound Cleanser Soap and water 2. Periwound Care Moisturizing lotion TCA Cream 3. Primary Dressing Applied Calcium Alginate Ag 4. Secondary Dressing ABD Pad Dry Gauze 6. Support Layer Applied 4 layer compression Water quality scientist) Signed: 02/09/2020 6:12:10 PM By: [name] RN, BSN Entered By:  [name] on 02/06/2020 07:59:21 -------------------------------------------------------------------------------- Vitals Details Patient Name: Date of Service: [name], [name] 02/06/2020 7:30 AM Medical Record [id number] Patient Account Number: [id number] Date of Birth/Sex: Treating RN: 02-23-1976 (44 y.o. [name] Primary Care Loreda Silverio: [name], [name] Other Clinician: Referring Abygail Galeno: Treating Maisa Bedingfield/Extender:[name], [name], [name] in Treatment: 78 Vital Signs Time Taken: 07:50 Temperature (F): 98.7 Height (in): 74 Pulse (bpm): 93 Weight (lbs): 325 Respiratory Rate (breaths/min): 18 Body Mass Index (BMI): 41.7 Blood Pressure (mmHg): 164/90 Reference Range: 80 - 120 mg / dl Electronic Signature(s) Signed: 02/09/2020 6:12:10 PM By: [name] RN, BSN Entered By: [name] on 02/06/2020 07:50:25

## 2020-02-11 ENCOUNTER — Ambulatory Visit (HOSPITAL_BASED_OUTPATIENT_CLINIC_OR_DEPARTMENT_OTHER): Payer: Medicare HMO | Admitting: Physician Assistant

## 2020-02-11 NOTE — Progress Notes (Signed)
[name], [name] ([id number]) Visit Report for 12/26/2019 Arrival Information Details Patient Name: Date of Service: [name], [name] 12/26/2019 7:30 AM Medical Record [id number] Patient Account Number: [id number] Date of Birth/Sex: Treating RN: February 25, 1976 (44 y.o. [name] Primary Care [name]: [name], [name] Other Clinician: Referring [name]: Treating [name]/Extender:[name], [name], [name] Weeks in Treatment: 37 Visit Information History Since Last Visit Added or deleted any medications: No Patient Arrived: Ambulatory Any new allergies or adverse reactions: No Arrival Time: 07:55 Had a fall or experienced change in No Accompanied By: self activities of daily living that may affect Transfer Assistance: None risk of falls: Patient Identification Verified: Yes Signs or symptoms of abuse/neglect since last No Secondary Verification Process Completed: Yes visito Patient Requires Transmission-Based No Hospitalized since last visit: No Precautions: Implantable device outside of the clinic excluding No Patient Has Alerts: No cellular tissue based products placed in the center since last visit: Has Dressing in Place as Prescribed: Yes Pain Present Now: No Electronic Signature(s) Signed: 02/11/2020 9:23:26 AM By: Sandre Kitty Entered By: Sandre Kitty on 12/26/2019 07:57:40 -------------------------------------------------------------------------------- Compression Therapy Details Patient Name: Date of Service: [name], [name] 12/26/2019 7:30 AM Medical Record [name] Patient Account Number: [id number] Date of Birth/Sex: Treating RN: Jun 27, 1976 (44 y.o. [name] Primary Care [name]: [name], [name] Other Clinician: Referring [name]: Treating [name]/Extender:[name], [name], [name] Weeks in Treatment: 35 Compression Therapy Performed for Wound Wound #[id number] Left,Anterior Lower Leg Assessment: Performed By: Clinician  [name] Pilling, RN Compression Type: Four Layer Post Procedure Diagnosis Same as Pre-procedure Electronic Signature(s) Signed: 12/26/2019 5:17:01 PM By: Kela Millin Entered By: Kela Millin on 12/26/2019 08:12:07 -------------------------------------------------------------------------------- Encounter Discharge Information Details Patient Name: Date of Service: [name], [name] 12/26/2019 7:30 AM Medical Record [id number] Patient Account Number: [id number] Date of Birth/Sex: Treating RN: 1975-12-05 (44 y.o. [name] Primary Care Aura [name]: [name], [name] Other Clinician: Referring [name]: Treating [name]/Extender:[name], [name], [name] Weeks in Treatment: 50 Encounter Discharge Information Items Discharge Condition: Stable Ambulatory Status: Ambulatory Discharge Destination: Home Transportation: Private Auto Accompanied By: self Schedule Follow-up Appointment: Yes Clinical Summary of Care: Electronic Signature(s) Signed: 12/26/2019 4:47:37 PM By: [name] Entered By: [name] on 12/26/2019 08:27:01 -------------------------------------------------------------------------------- Lower Extremity Assessment Details Patient Name: Date of Service: [name], [name] 12/26/2019 7:30 AM Medical Record [id number] Patient Account Number: [id number] Date of Birth/Sex: Treating RN: 14-Jan-1976 (44 y.o. [name] Primary Care [name]: [name], [name] Other Clinician: Referring [name]: Treating [name]/Extender:[name], [name], [name] Weeks in Treatment: 35 Edema Assessment Assessed: [Left: Yes] [Right: No] Edema: [Left: Ye] [Right: s] Calf Left: Right: Point of Measurement: 35 cm From Medial Instep 46 cm cm Ankle Left: Right: Point of Measurement: 11.5 cm From Medial Instep 24 cm cm Vascular Assessment Pulses: Dorsalis Pedis Palpable: [Left:Yes] Electronic Signature(s) Signed: 12/26/2019 4:47:37 PM By: [name] Entered By: [name] on 12/26/2019 08:04:39 -------------------------------------------------------------------------------- Multi Wound Chart Details Patient Name: Date of Service: [name], [name] 12/26/2019 7:30 AM Medical Record [name] Patient Account Number: [id number] Date of Birth/Sex: Treating RN: 01/31/1976 (44 y.o. [name] Primary Care [name]: [name], [name] Other Clinician: Referring [name]: Treating [name]/Extender:[name], [name], [name] Weeks in Treatment: 35 Vital Signs Height(in): 74 Pulse(bpm): 91 Weight(lbs): 325 Blood Pressure(mmHg): 150/82 Body Mass Index(BMI): 42 Temperature(F): 98.8 Respiratory 20 Rate(breaths/min): Photos: [13:No Photos] [16:No Photos] [9RR:No Photos] Wound Location: [13:Left Lower Leg - Anterior, Left Calcaneus - Medial Proximal] [9RR:Left Lower Leg - Anterior] Wounding Event: [13:Gradually Appeared] [16:Gradually Appeared] [9RR:Gradually Appeared] Primary Etiology: [13:Venous Leg Ulcer] [16:Lymphedema] [9RR:Venous Leg Ulcer] Comorbid History: [  13:Asthma, Sleep Apnea, DeepAsthma, Sleep Apnea, DeepAsthma, Sleep Apnea, Deep Vein Thrombosis, Phlebitis, Vein Thrombosis, Phlebitis, Vein Thrombosis, Phlebitis, Osteoarthritis, Neuropathy Osteoarthritis, Neuropathy Osteoarthritis,  Neuropathy] Date Acquired: [13:09/23/2019] [16:11/07/2019] [9RR:03/31/2019] Weeks of Treatment: [13:12] [16:7] [9RR:35] Wound Status: [13:Healed - Epithelialized] [16:Healed - Epithelialized] [9RR:Open] Wound Recurrence: [13:No] [16:No] [9RR:Yes] Measurements L x W x D 0x0x0 [16:0x0x0] [9RR:1.5x1.4x0.1] (cm) Area (cm) : [13:0] [16:0] [9RR:1.649] Volume (cm) : [13:0] [16:0] [9RR:0.165] % Reduction in Area: [13:100.00%] [16:100.00%] [9RR:87.50%] % Reduction in Volume: [13:100.00%] [16:100.00%] [9RR:87.50%] Classification: [13:Full Thickness Without Exposed Support Structures Exposed Support Structures Exposed Support  Structures] [16:Full Thickness Without] [9RR:Full Thickness Without] Exudate Amount: [13:N/A] [16:N/A] [9RR:Medium] Exudate Type: [13:N/A] [16:N/A] [9RR:Serosanguineous] Exudate Color: [13:N/A] [16:N/A] [9RR:red, brown] Wound Margin: [13:N/A] [16:N/A] [9RR:Distinct, outline attached] Granulation Amount: [13:N/A] [16:N/A] [9RR:None Present (0%)] Necrotic Amount: [13:N/A] [16:N/A] [9RR:Small (1-33%)] Epithelialization: [13:N/A N/A] [16:N/A N/A] [9RR:Medium (34-66%) Compression Therapy] Treatment Notes Electronic Signature(s) Signed: 12/26/2019 5:17:01 PM By: Kela Millin Signed: 12/26/2019 5:26:28 PM By: Linton Ham MD Entered By: Linton Ham on 12/26/2019 08:17:21 -------------------------------------------------------------------------------- Multi-Disciplinary Care Plan Details Patient Name: Date of Service: [name], [name] 12/26/2019 7:30 AM Medical Record [id number] Patient Account Number: [id number] Date of Birth/Sex: Treating RN: July 23, 1976 (44 y.o. [name] Primary Care Ruchel Brandenburger: [name], [name] Other Clinician: Referring Gurinder Toral: Treating Janifer Gieselman/Extender:[name], [name], [name] Weeks in Treatment: 35 Active Inactive Venous Leg Ulcer Nursing Diagnoses: Actual venous Insuffiency (use after diagnosis is confirmed) Knowledge deficit related to disease process and management Goals: Patient will maintain optimal edema control Date Initiated: 11/14/2019 Target Resolution Date: 01/09/2020 Goal Status: Active Interventions: Assess peripheral edema status every visit. Compression as ordered Treatment Activities: Therapeutic compression applied : 11/14/2019 Notes: Wound/Skin Impairment Nursing Diagnoses: Knowledge deficit related to ulceration/compromised skin integrity Goals: Patient/caregiver will verbalize understanding of skin care regimen Date Initiated: 07/22/2019 Target Resolution Date: 01/09/2020 Goal Status: Active Ulcer/skin  breakdown will have a volume reduction of 30% by week 4 Target Resolution Date Initiated: 07/22/2019 Date Inactivated: 08/26/2019 Date: 08/22/2019 Goal Status: Met Ulcer/skin breakdown will have a volume reduction of 50% by week 8 Target Resolution Date Initiated: 08/26/2019 Date Inactivated: 09/30/2019 Date: 09/19/2019 Goal Status: Unmet Unmet Reason: comorbities Ulcer/skin breakdown will have a volume reduction of 80% by week 12 Date Initiated: 09/30/2019 Target Resolution Date: 01/09/2020 Goal Status: Active Interventions: Assess patient/caregiver ability to obtain necessary supplies Assess patient/caregiver ability to perform ulcer/skin care regimen upon admission and as needed Assess ulceration(s) every visit Notes: Electronic Signature(s) Signed: 12/26/2019 5:17:01 PM By: Kela Millin Entered By: Kela Millin on 12/26/2019 07:53:15 -------------------------------------------------------------------------------- Pain Assessment Details Patient Name: Date of Service: [name], [name] 12/26/2019 7:30 AM Medical Record [id number] Patient Account Number: [id number] Date of Birth/Sex: Treating RN: 05-Sep-1976 (44 y.o. [name] Primary Care Markee Matera: [name], [name] Other Clinician: Referring Vittorio Mohs: Treating Detrich Rakestraw/Extender:[name], [name], [name] Weeks in Treatment: 35 Active Problems Location of Pain Severity and Description of Pain Patient Has Paino No Site Locations Pain Management and Medication Current Pain Management: Electronic Signature(s) Signed: 12/26/2019 5:17:01 PM By: Kela Millin Signed: 02/11/2020 9:23:26 AM By: Sandre Kitty Entered By: Sandre Kitty on 12/26/2019 07:59:52 -------------------------------------------------------------------------------- Patient/Caregiver Education Details Patient Name: Date of Service: LADERIUS, VALBUENA 2/26/2021andnbsp7:30 AM Medical Record [id number] Patient Account Number:  [id number] Date of Birth/Gender: 1976/03/19 (43 y.o. M) Treating RN: Kela Millin Primary Care Physician: [name], [name] Other Clinician: Referring Physician: Treating Physician/Extender:[name], [name], [name] Weeks in Treatment: 23 Education Assessment Education Provided To: Patient Education Topics Provided Wound/Skin Impairment: Methods: Explain/Verbal Responses: State content correctly Electronic Signature(s)  Signed: 12/26/2019 5:17:01 PM By: Kela Millin Entered By: Kela Millin on 12/26/2019 07:54:44 -------------------------------------------------------------------------------- Wound Assessment Details Patient Name: Date of Service: [name], [name] 12/26/2019 7:30 AM Medical Record [id number] Patient Account Number: [id number] Date of Birth/Sex: Treating RN: September 24, 1976 (44 y.o. [name] Primary Care Stockton Nunley: [name], [name] Other Clinician: Referring Khrista Braun: Treating Sion Thane/Extender:[name], [name], [name] Weeks in Treatment: 35 Wound Status Wound Number: [id number] Primary Venous Leg Ulcer Etiology: Wound Location: Left Lower Leg - Anterior, Proximal Wound Healed - Epithelialized Wounding Event: Gradually Appeared Status: Date Acquired: 09/23/2019 Comorbid Asthma, Sleep Apnea, Deep Vein Weeks Of Treatment: 12 History: Thrombosis, Phlebitis, Osteoarthritis, Clustered Wound: No Neuropathy Photos Wound Measurements Length: (cm) 0 Width: (cm) 0 Depth: (cm) 0 Area: (cm) 0 Volume: (cm) 0 Wound Description Classification: Full Thickness Without Exposed Suppor Structures % Reduction in Area: 100% % Reduction in Volume: 100% t Electronic Signature(s) Signed: 12/26/2019 5:12:28 PM By: Mikeal Hawthorne EMT/HBOT Signed: 12/26/2019 5:17:01 PM By: Kela Millin Entered By: Mikeal Hawthorne on 12/26/2019 15:44:28 -------------------------------------------------------------------------------- Wound Assessment  Details Patient Name: Date of Service: [name], [name] 12/26/2019 7:30 AM Medical Record [id number] Patient Account Number: [id number] Date of Birth/Sex: Treating RN: 06-27-1976 (44 y.o. [name] Primary Care Trino Higinbotham: [name], [name] Other Clinician: Referring Thedora Rings: Treating Janautica Netzley/Extender:[name], [name], [name] Weeks in Treatment: 35 Wound Status Wound Number: [id number] Primary Lymphedema Etiology: Wound Location: Left Calcaneus - Medial Wound Healed - Epithelialized Wounding Event: Gradually Appeared Status: Date Acquired: 11/07/2019 Comorbid Asthma, Sleep Apnea, Deep Vein Weeks Of Treatment: 7 History: Thrombosis, Phlebitis, Osteoarthritis, Clustered Wound: No Neuropathy Photos Wound Measurements Length: (cm) 0 Width: (cm) 0 Depth: (cm) 0 Area: (cm) 0 Volume: (cm) 0 Wound Description Full Thickness Without Exposed Suppor Classification: Structures % Reduction in Area: 100% % Reduction in Volume: 100% t Electronic Signature(s) Signed: 12/26/2019 5:12:28 PM By: Mikeal Hawthorne EMT/HBOT Signed: 12/26/2019 5:17:01 PM By: Kela Millin Entered By: Mikeal Hawthorne on 12/26/2019 15:44:52 -------------------------------------------------------------------------------- Wound Assessment Details Patient Name: Date of Service: [name], [name] 12/26/2019 7:30 AM Medical Record [id number] Patient Account Number: [id number] Date of Birth/Sex: Treating RN: 03-Mar-1976 (44 y.o. [name] Primary Care Landry Lookingbill: [name], [name] Other Clinician: Referring Glenda Spelman: Treating Akila Batta/Extender:[name], [name], [name] Weeks in Treatment: 35 Wound Status Wound Number: [id number] Primary Venous Leg Ulcer Etiology: Wound Location: Left Lower Leg - Anterior Wound Open Wounding Event: Gradually Appeared Status: Date Acquired: 03/31/2019 Comorbid Asthma, Sleep Apnea, Deep Vein Thrombosis, Weeks Of Treatment: 35 History: Phlebitis, Osteoarthritis,  Neuropathy Clustered Wound: No Photos Wound Measurements Length: (cm) 1.5 % Reduct Width: (cm) 1.4 % Reduct Depth: (cm) 0.1 Epitheli Area: (cm) 1.649 Tunneli Volume: (cm) 0.165 Undermi Wound Description Full Thickness Without Exposed Support Foul Odo Classification: Structures Slough/F Wound Distinct, outline attached Margin: Exudate Medium Amount: Exudate Serosanguineous Type: Exudate red, brown Color: Wound Bed Granulation Amount: None Present (0%) Necrotic Amount: Small (1-33%) Fascia E Fat Laye Tendon E Muscle E Joint Ex Bone Exp r After Cleansing: No ibrino Yes Exposed Structure xposed: No r (Subcutaneous Tissue) Exposed: No xposed: No xposed: No posed: No osed: No ion in Area: 87.5% ion in Volume: 87.5% alization: Medium (34-66%) ng: No ning: No Electronic Signature(s) Signed: 12/26/2019 5:12:28 PM By: Mikeal Hawthorne EMT/HBOT Signed: 12/26/2019 5:17:01 PM By: Kela Millin Entered By: Mikeal Hawthorne on 12/26/2019 15:44:01 -------------------------------------------------------------------------------- Vitals Details Patient Name: Date of Service: [name], [name] 12/26/2019 7:30 AM Medical Record [id number] Patient Account Number: [id number] Date of Birth/Sex: Treating RN: 08-Nov-1975 (44 y.o. [name] Primary Care Damari Hiltz: [name], [name]  Other Clinician: Referring Akela Pocius: Treating Wirt Hemmerich/Extender:[name], [name], [name] Weeks in Treatment: 35 Vital Signs Time Taken: 07:57 Temperature (F): 98.8 Height (in): 74 Pulse (bpm): 91 Weight (lbs): 325 Respiratory Rate (breaths/min): 20 Body Mass Index (BMI): 41.7 Blood Pressure (mmHg): 150/82 Reference Range: 80 - 120 mg / dl Electronic Signature(s) Signed: 02/11/2020 9:23:26 AM By: Sandre Kitty Entered By: Sandre Kitty on 12/26/2019 07:59:48

## 2020-02-11 NOTE — Progress Notes (Signed)
[name], [name] ([id number]) Visit Report for 01/19/2020 Arrival Information Details Patient Name: Date of Service: [name], [name] 01/19/2020 7:30 AM Medical Record [id number] Patient Account Number: [id number] Date of Birth/Sex: Treating RN: 15-Jan-1976 (44 y.o. [name] Primary Care Linton Stolp: [name], [name] Other Clinician: Referring Yahye Siebert: Treating Kellen Dutch/Extender:[name], [name], [name] in Treatment: 17 Visit Information History Since Last Visit Added or deleted any medications: No Patient Arrived: Ambulatory Any new allergies or adverse reactions: No Arrival Time: 07:39 Had a fall or experienced change in No Accompanied By: self activities of daily living that may affect Transfer Assistance: None risk of falls: Patient Identification Verified: Yes Signs or symptoms of abuse/neglect since last No Secondary Verification Process Completed: Yes visito Patient Requires Transmission-Based No Hospitalized since last visit: No Precautions: Implantable device outside of the clinic excluding No Patient Has Alerts: No cellular tissue based products placed in the center since last visit: Has Dressing in Place as Prescribed: Yes Pain Present Now: No Electronic Signature(s) Signed: 02/11/2020 9:21:08 AM By: Sandre Kitty Entered By: Sandre Kitty on 01/19/2020 07:40:16 -------------------------------------------------------------------------------- Compression Therapy Details Patient Name: Date of Service: [name], [name] 01/19/2020 7:30 AM Medical Record [id number] Patient Account Number: [id number] Date of Birth/Sex: Treating RN: November 15, 1975 (44 y.o. [name] Primary Care Eisa Conaway: [name], [name] Other Clinician: Referring Darya Bigler: Treating Niemah Schwebke/Extender:[name], [name], [name] in Treatment: 38 Compression Therapy Performed for Wound Wound #17 Left,Proximal,Anterior Lower Leg Assessment: Performed By: Clinician  [name], RN Compression Type: Four Layer Post Procedure Diagnosis Same as Pre-procedure Electronic Signature(s) Signed: 01/19/2020 5:18:21 PM By: [name] RN, BSN Entered By: [name] on 01/19/2020 08:05:15 -------------------------------------------------------------------------------- Compression Therapy Details Patient Name: Date of Service: [name], [name] 01/19/2020 7:30 AM Medical Record [id number] Patient Account Number: [id number] Date of Birth/Sex: Treating RN: [birth year] (44 y.o. [name] Primary Care Aniya Jolicoeur: [name], [name] Other Clinician: Referring Jamaurie Bernier: Treating Dorell Gatlin/Extender:[name], [name], [name] in Treatment: 38 Compression Therapy Performed for Wound Wound #9RR Left,Anterior Lower Leg Assessment: Performed By: Clinician [name], RN Compression Type: Four Layer Post Procedure Diagnosis Same as Pre-procedure Electronic Signature(s) Signed: 01/19/2020 5:18:21 PM By: [name] RN, BSN Entered By: [name] on 01/19/2020 08:05:15 -------------------------------------------------------------------------------- Encounter Discharge Information Details Patient Name: Date of Service: [name], [name] 01/19/2020 7:30 AM Medical Record [id number] Patient Account Number: [id number] Date of Birth/Sex: Treating RN: [birth year] (44 y.o. [name] Primary Care Sierria Bruney: [name], [name] Other Clinician: Referring Tomasz Steeves: Treating Hitomi Slape/Extender:[name], [name], [name] in Treatment: 110 Encounter Discharge Information Items Discharge Condition: Stable Ambulatory Status: Ambulatory Discharge Destination: Home Transportation: Private Auto Accompanied By: self Schedule Follow-up Appointment: Yes Clinical Summary of Care: Electronic Signature(s) Signed: 01/19/2020 5:27:21 PM By: Deon Pilling Entered By: Deon Pilling on 01/19/2020  08:17:23 -------------------------------------------------------------------------------- Lower Extremity Assessment Details Patient Name: Date of Service: [name], [name] 01/19/2020 7:30 AM Medical Record [id number] Patient Account Number: [id number] Date of Birth/Sex: Treating RN: 11/11/1975 (44 y.o. [name] Primary Care Fryda Molenda: [name], [name] Other Clinician: Referring Zykera Abella: Treating Sol Englert/Extender:[name], [name], [name] in Treatment: 38 Edema Assessment Assessed: [Left: No] [Right: No] Edema: [Left: Ye] [Right: s] Calf Left: Right: Point of Measurement: 35 cm From Medial Instep 43 cm cm Ankle Left: Right: Point of Measurement: 11.5 cm From Medial Instep 24 cm cm Vascular Assessment Pulses: Dorsalis Pedis Palpable: [Left:Yes] Electronic Signature(s) Signed: 01/19/2020 5:18:21 PM By: [name] RN, BSN Entered By: [name] on 01/19/2020 07:55:01 -------------------------------------------------------------------------------- Multi Wound Chart Details Patient Name: Date of Service: [name], [name] 01/19/2020 7:30 AM  Medical Record [id number] Patient Account Number: [id number] Date of Birth/Sex: Treating RN: 02/19/1976 (44 y.o. [name] Primary Care Ainsley Sanguinetti: [name], [name] Other Clinician: Referring Kayan Blissett: Treating Damarrion Mimbs/Extender:[name], [name], [name] in Treatment: 28 Vital Signs Height(in): 74 Pulse(bpm): 80 Weight(lbs): 325 Blood Pressure(mmHg): 140/86 Body Mass Index(BMI): 42 Temperature(F): 98.0 Respiratory 20 Rate(breaths/min): Photos: [17:No Photos] [9RR:No Photos] [N/A:N/A] Wound Location: [17:Left Lower Leg - Anterior, Left Lower Leg - Anterior Proximal] [N/A:N/A] Wounding Event: [17:Gradually Appeared] [9RR:Gradually Appeared] [N/A:N/A] Primary Etiology: [17:Venous Leg Ulcer] [9RR:Venous Leg Ulcer] [N/A:N/A] Secondary Etiology: [17:Lymphedema] [9RR:N/A] [N/A:N/A] Comorbid  History: [17:Asthma, Sleep Apnea, DeepAsthma, Sleep Apnea, DeepN/A Vein Thrombosis, Phlebitis, Vein Thrombosis, Phlebitis, Osteoarthritis, Neuropathy Osteoarthritis, Neuropathy] Date Acquired: [17:01/16/2020] [9RR:03/31/2019] [N/A:N/A] Weeks of Treatment: [17:0] [9RR:38] [N/A:N/A] Wound Status: [17:Open] [9RR:Open] [N/A:N/A] Wound Recurrence: [17:No] [9RR:Yes] [N/A:N/A] Clustered Wound: [17:Yes] [9RR:No] [N/A:N/A] Clustered Quantity: [17:3] [9RR:N/A] [N/A:N/A] Measurements L x W x D 3x3.5x0.1 [9RR:0.7x0.6x0.1] [N/A:N/A] (cm) Area (cm) : [17:8.247] [9RR:0.33] [N/A:N/A] Volume (cm) : [17:0.825] [9RR:0.033] [N/A:N/A] % Reduction in Area: [17:-1.00%] [9RR:97.50%] [N/A:N/A] % Reduction in Volume: -1.00% [9RR:97.50%] [N/A:N/A] Classification: [17:Full Thickness Without Exposed Support Structures Exposed Support Structures] [9RR:Full Thickness Without] [N/A:N/A] Exudate Amount: [17:Medium] [9RR:Medium] [N/A:N/A] Exudate Type: [17:Serosanguineous] [9RR:Serosanguineous] [N/A:N/A] Exudate Color: [17:red, brown] [9RR:red, brown] [N/A:N/A] Wound Margin: [17:Distinct, outline attached Distinct, outline attached N/A] Granulation Amount: [17:Large (67-100%)] [9RR:Large (67-100%)] [N/A:N/A] Granulation Quality: [17:Red] [9RR:Red] [N/A:N/A] Necrotic Amount: [17:Small (1-33%)] [9RR:Small (1-33%)] [N/A:N/A] Exposed Structures: [17:Fat Layer (Subcutaneous Fat Layer (Subcutaneous N/A Tissue) Exposed: Yes Fascia: No Tendon: No Muscle: No Joint: No Bone: No None] [9RR:Tissue) Exposed: Yes Fascia: No Tendon: No Muscle: No Joint: No Bone: No Medium (34-66%)] [N/A:N/A] Treatment Notes Electronic Signature(s) Signed: 01/19/2020 5:18:21 PM By: [name] RN, BSN Signed: 01/19/2020 5:34:39 PM By: Linton Ham MD Entered By: Linton Ham on 01/19/2020 08:05:05 -------------------------------------------------------------------------------- Multi-Disciplinary Care Plan Details Patient Name: Date of  Service: [name], [name] 01/19/2020 7:30 AM Medical Record [id number] Patient Account Number: [id number] Date of Birth/Sex: Treating RN: [birth year] (44 y.o. [name] Primary Care Katelind Pytel: [name], [name] Other Clinician: Referring Areyanna Figeroa: Treating Alexyia Guarino/Extender:[name], [name], [name] in Treatment: 2 Active Inactive Venous Leg Ulcer Nursing Diagnoses: Actual venous Insuffiency (use after diagnosis is confirmed) Knowledge deficit related to disease process and management Goals: Patient will maintain optimal edema control Date Initiated: 11/14/2019 Target Resolution Date: 01/23/2020 Goal Status: Active Interventions: Assess peripheral edema status every visit. Compression as ordered Treatment Activities: Therapeutic compression applied : 11/14/2019 Notes: Wound/Skin Impairment Nursing Diagnoses: Knowledge deficit related to ulceration/compromised skin integrity Goals: Patient/caregiver will verbalize understanding of skin care regimen Date Initiated: 07/22/2019 Target Resolution Date: 01/23/2020 Goal Status: Active Ulcer/skin breakdown will have a volume reduction of 30% by week 4 Target Resolution Date Initiated: 07/22/2019 Date Inactivated: 08/26/2019 Date: 08/22/2019 Goal Status: Met Ulcer/skin breakdown will have a volume reduction of 50% by week 8 Target Resolution Date Initiated: 08/26/2019 Date Inactivated: 09/30/2019 Date: 09/19/2019 Goal Status: Unmet Unmet Reason: comorbities Ulcer/skin breakdown will have a volume reduction of 80% by week 12 Date Initiated: 09/30/2019 Target Resolution Date: 01/23/2020 Goal Status: Active Interventions: Assess patient/caregiver ability to obtain necessary supplies Assess patient/caregiver ability to perform ulcer/skin care regimen upon admission and as needed Assess ulceration(s) every visit Notes: Electronic Signature(s) Signed: 01/19/2020 5:18:21 PM By: [name] RN, BSN Entered By: [name] on 01/19/2020 07:45:10 -------------------------------------------------------------------------------- Pain Assessment Details Patient Name: Date of Service: [name], [name] 01/19/2020 7:30 AM Medical Record [id number] Patient Account Number: [id number] Date of Birth/Sex: Treating RN: [birth year] (44 y.o. M)  [name] Primary Care Reba Hulett: [name], [name] Other Clinician: Referring Damien Batty: Treating Kelsee Preslar/Extender:[name], [name], [name] in Treatment: 38 Active Problems Location of Pain Severity and Description of Pain Patient Has Paino No Site Locations Pain Management and Medication Current Pain Management: Electronic Signature(s) Signed: 01/19/2020 5:18:21 PM By: [name] RN, BSN Signed: 02/11/2020 9:21:08 AM By: Sandre Kitty Entered By: Sandre Kitty on 01/19/2020 07:40:23 -------------------------------------------------------------------------------- Patient/Caregiver Education Details Patient Name: Date of Service: [name], [name] 3/22/2021andnbsp7:30 AM Medical Record [id number] Patient Account Number: [id number] Date of Birth/Gender: 15-Dec-1975 (43 y.o. M) Treating RN: [name] Primary Care Physician: Lujean Amel Other Clinician: Referring Physician: Treating Physician/Extender:[name], [name], [name] in Treatment: 61 Education Assessment Education Provided To: Patient Education Topics Provided Wound/Skin Impairment: Methods: Explain/Verbal Responses: State content correctly Electronic Signature(s) Signed: 01/19/2020 5:18:21 PM By: [name] RN, BSN Entered By: [name] on 01/19/2020 07:45:20 -------------------------------------------------------------------------------- Wound Assessment Details Patient Name: Date of Service: [name], [name] 01/19/2020 7:30 AM Medical Record [id number] Patient Account Number: [id number] Date of Birth/Sex: Treating RN: 01-26-1976 (44 y.o. [name] Primary Care Oletha Tolson: [name], [name] Other Clinician: Referring Treacy Holcomb: Treating Maitlyn Penza/Extender:[name], [name], [name] in Treatment: 38 Wound Status Wound Number: [id number] Primary Venous Leg Ulcer Etiology: Wound Location: Left Lower Leg - Anterior, Proximal Secondary Lymphedema Wounding Event: Gradually Appeared Etiology: Date Acquired: 01/16/2020 Wound Open Weeks Of Treatment: 0 Status: Clustered Wound: Yes Comorbid Asthma, Sleep Apnea, Deep Vein History: Thrombosis, Phlebitis, Osteoarthritis, Neuropathy Photos Photo Uploaded By: Mikeal Hawthorne on 01/20/2020 14:24:53 Wound Measurements Length: (cm) 3 % Reduct Width: (cm) 3.5 % Reduct Depth: (cm) 0.1 Epitheli Clustered Quantity: 3 Tunnelin Area: (cm) 8.247 Undermi Volume: (cm) 0.825 Wound Description Classification: Full Thickness Without Exposed Support Foul Odo Structures Slough/F Wound Distinct, outline attached Margin: Exudate Medium Amount: Exudate Serosanguineous Type: Exudate red, brown Color: Wound Bed Granulation Amount: Large (67-100%) Granulation Quality: Red Fascia E Necrotic Amount: Small (1-33%) Fat Laye Necrotic Quality: Adherent Slough Tendon E Muscle E Joint Ex Bone Exp r After Cleansing: No ibrino No Exposed Structure xposed: No r (Subcutaneous Tissue) Exposed: Yes xposed: No xposed: No posed: No osed: No ion in Area: -1% ion in Volume: -1% alization: None g: No ning: No Electronic Signature(s) Signed: 01/19/2020 5:18:21 PM By: [name] RN, BSN Entered By: [name] on 01/19/2020 07:55:25 -------------------------------------------------------------------------------- Wound Assessment Details Patient Name: Date of Service: [name], [name] 01/19/2020 7:30 AM Medical Record [id number] Patient Account Number: [id number] Date of Birth/Sex: Treating RN: 1976/02/09 (44 y.o. [name] Primary Care Natnael Biederman: [name], [name] Other  Clinician: Referring Viggo Perko: Treating Kaedynce Tapp/Extender:[name], [name], [name] in Treatment: 38 Wound Status Wound Number: [id number] Primary Venous Leg Ulcer Etiology: Wound Location: Left Lower Leg - Anterior Wound Open Wounding Event: Gradually Appeared Status: Date Acquired: 03/31/2019 Comorbid Asthma, Sleep Apnea, Deep Vein Thrombosis, Weeks Of Treatment: 38 History: Phlebitis, Osteoarthritis, Neuropathy Clustered Wound: No Photos Photo Uploaded By: Mikeal Hawthorne on 01/20/2020 14:24:54 Wound Measurements Length: (cm) 0.7 % Reduct Width: (cm) 0.6 % Reduct Depth: (cm) 0.1 Epitheli Area: (cm) 0.33 Tunneli Volume: (cm) 0.033 Undermi Wound Description Classification: Full Thickness Without Exposed Support Foul Odo Structures Slough/F Wound Distinct, outline attached Margin: Exudate Medium Amount: Exudate Serosanguineous Type: Exudate red, brown Color: Wound Bed Granulation Amount: Large (67-100%) Granulation Quality: Red Fascia E Necrotic Amount: Small (1-33%) Fat Laye Necrotic Quality: Adherent Slough Tendon E Muscle E Joint Ex Bone Exp r After Cleansing: No ibrino No Exposed Structure xposed: No r (Subcutaneous Tissue) Exposed: Yes xposed:  No xposed: No posed: No osed: No ion in Area: 97.5% ion in Volume: 97.5% alization: Medium (34-66%) ng: No ning: No Electronic Signature(s) Signed: 01/19/2020 5:18:21 PM By: [name] RN, BSN Entered By: [name] on 01/19/2020 07:55:47 -------------------------------------------------------------------------------- [place] Details Patient Name: Date of Service: JACQUELYN, ANTONY 01/19/2020 7:30 AM Medical Record [id number] Patient Account Number: [id number] Date of Birth/Sex: Treating RN: 1976-08-28 (44 y.o. [name] Primary Care Jervis Trapani: [name], [name] Other Clinician: Referring Chiyoko Torrico: Treating Demari Kropp/Extender:[name], [name], [name] in Treatment: 39 Vital  Signs Time Taken: 07:40 Temperature (F): 98.0 Height (in): 74 Pulse (bpm): 80 Weight (lbs): 325 Respiratory Rate (breaths/min): 20 Body Mass Index (BMI): 41.7 Blood Pressure (mmHg): 140/86 Reference Range: 80 - 120 mg / dl Electronic Signature(s) Signed: 02/11/2020 9:21:08 AM By: Sandre Kitty Entered By: Sandre Kitty on 01/19/2020 07:42:27

## 2020-02-20 ENCOUNTER — Encounter (HOSPITAL_BASED_OUTPATIENT_CLINIC_OR_DEPARTMENT_OTHER): Payer: 59 | Admitting: Internal Medicine

## 2020-02-20 ENCOUNTER — Other Ambulatory Visit: Payer: Self-pay

## 2020-02-20 DIAGNOSIS — I87332 Chronic venous hypertension (idiopathic) with ulcer and inflammation of left lower extremity: Secondary | ICD-10-CM | POA: Diagnosis not present

## 2020-02-24 NOTE — Progress Notes (Signed)
YAGO, LUDVIGSEN ([id number]) Visit Report for 02/20/2020 SuperBill Details Patient Name: Date of Service: [name], [name] 02/20/2020 Medical Record [id number] Patient Account Number: [id number] Date of Birth/Sex: Treating RN: 1976/05/07 (43 y.o. [name]) [name] Primary Care Provider: [name], [name] Other Clinician: Referring Provider: Treating Provider/Extender:Saylah Ketner, [name], [name] in Treatment: 43 Diagnosis Coding ICD-10 Codes Code Description L97.821 Non-pressure chronic ulcer of other part of left lower leg limited to breakdown of skin I87.322 Chronic venous hypertension (idiopathic) with inflammation of left lower extremity L03.116 Cellulitis of left lower limb I89.0 Lymphedema, not elsewhere classified D68.69 Other thrombophilia Facility Procedures CPT4 Code Description Modifier Quantity [id number] (Facility Use Only) [phone] - APPLY MULTLAY COMPRS LWR RT LEG 1 Electronic Signature(s) Signed: 02/20/2020 5:22:40 PM By: Baltazar Najjar MD Signed: 02/24/2020 5:28:23 PM By: [name] RN Entered By: [name] on 02/20/2020 08:27:16

## 2020-02-27 ENCOUNTER — Encounter (HOSPITAL_BASED_OUTPATIENT_CLINIC_OR_DEPARTMENT_OTHER): Payer: 59 | Admitting: Internal Medicine

## 2020-02-27 DIAGNOSIS — I87332 Chronic venous hypertension (idiopathic) with ulcer and inflammation of left lower extremity: Secondary | ICD-10-CM | POA: Diagnosis not present

## 2020-02-27 NOTE — Progress Notes (Signed)
DAYON, [name] ([id number]) Visit Report for 02/27/2020 Arrival Information Details Patient Name: Date of Service: [name], [place] [name] 02/27/2020 7:30 A M Medical Record Number: [id number] Patient Account Number: [id number] Date of Birth/Sex: Treating RN: [birth year] (44 y.o. [name] Primary Care [name]: [name], [name] Other Clinician: Referring Pearlean Sabina: Treating Burnetta Kohls/Extender: Otis Brace, [name] in Treatment: 26 Visit Information History Since Last Visit Added or deleted any medications: No Patient Arrived: Ambulatory Any new allergies or adverse reactions: No Arrival Time: 07:48 Had a fall or experienced change in No Accompanied By: self activities of daily living that may affect Transfer Assistance: None risk of falls: Patient Identification Verified: Yes Signs or symptoms of abuse/neglect since last visito No Secondary Verification Process Completed: Yes Hospitalized since last visit: No Patient Requires Transmission-Based Precautions: No Implantable device outside of the clinic excluding No Patient Has Alerts: No cellular tissue based products placed in the center since last visit: Has Dressing in Place as Prescribed: No Has Compression in Place as Prescribed: No Pain Present Now: Yes Electronic Signature(s) Signed: 02/27/2020 5:38:02 PM By: Deon Pilling Entered By: Deon Pilling on 02/27/2020 07:49:25 -------------------------------------------------------------------------------- Compression Therapy Details Patient Name: Date of Service: [name] Freeze, [place] [name] 02/27/2020 7:30 A M Medical Record Number: [id number] Patient Account Number: [id number] Date of Birth/Sex: Treating RN: [birth year] (44 y.o. [name] Primary Care Elvis Laufer: [name], [name] Other Clinician: Referring Nikhita Mentzel: Treating Anmarie Fukushima/Extender: Otis Brace, [name] in Treatment: 44 Compression Therapy Performed for Wound Assessment: NonWound Condition  Other Dermatologic Condition - Bilateral Leg Performed By: Clinician Deon Pilling, RN Compression Type: Four Layer Post Procedure Diagnosis Same as Pre-procedure Electronic Signature(s) Signed: 02/27/2020 5:51:03 PM By: Kela Millin Entered By: Kela Millin on 02/27/2020 08:12:12 -------------------------------------------------------------------------------- Encounter Discharge Information Details Patient Name: Date of Service: [name] Freeze, [place] [name] 02/27/2020 7:30 A M Medical Record Number: [id number] Patient Account Number: [id number] Date of Birth/Sex: Treating RN: [birth year] (44 y.o. [name] Primary Care Janesa Dockery: [name], [name] Other Clinician: Referring Jerrol Helmers: Treating Marshella Tello/Extender: Otis Brace, [name] in Treatment: 65 Encounter Discharge Information Items Discharge Condition: Stable Ambulatory Status: Ambulatory Discharge Destination: Home Transportation: Private Auto Accompanied By: self Schedule Follow-up Appointment: Yes Clinical Summary of Care: Electronic Signature(s) Signed: 02/27/2020 5:38:02 PM By: Deon Pilling Entered By: Deon Pilling on 02/27/2020 08:20:41 -------------------------------------------------------------------------------- Lower Extremity Assessment Details Patient Name: Date of Service: [name] BE, [place] [name] 02/27/2020 7:30 A M Medical Record Number: [id number] Patient Account Number: [id number] Date of Birth/Sex: Treating RN: 02-18-1976 (44 y.o. [name] Primary Care Guida Asman: [name], [name] Other Clinician: Referring Jonavon Trieu: Treating Kenzlei Runions/Extender: Otis Brace, [name] in Treatment: 44 Edema Assessment Assessed: [Left: Yes] [Right: No] Edema: [Left: Ye] [Right: s] Calf Left: Right: Point of Measurement: 35 cm From Medial Instep 46.5 cm cm Ankle Left: Right: Point of Measurement: 11.5 cm From Medial Instep 24.5 cm cm Vascular Assessment Pulses: Dorsalis  Pedis Palpable: [Left:Yes] Electronic Signature(s) Signed: 02/27/2020 5:38:02 PM By: Deon Pilling Signed: 02/27/2020 5:51:03 PM By: Kela Millin Entered By: Deon Pilling on 02/27/2020 07:50:49 -------------------------------------------------------------------------------- Multi Wound Chart Details Patient Name: Date of Service: [name] Freeze, [place] [name] 02/27/2020 7:30 A M Medical Record Number: [id number] Patient Account Number: [id number] Date of Birth/Sex: Treating RN: August 12, 1976 (44 y.o. [name] Primary Care Yanelli Zapanta: [name], [name] Other Clinician: Referring Timiya Howells: Treating Almina Schul/Extender: Otis Brace, [name] in Treatment: 62 Vital Signs Height(in): 74 Pulse(bpm): 91 Weight(lbs): 325 Blood Pressure(mmHg): 157/87 Body Mass Index(BMI): 42 Temperature(F): 98.8 Respiratory Rate(breaths/min): 18 Photos: [  9RR:No Photos Left, Anterior Lower Leg] [N/A:N/A N/A] Wound Location: [9RR:Gradually Appeared] [N/A:N/A] Wounding Event: [9RR:Venous Leg Ulcer] [N/A:N/A] Primary Etiology: [9RR:03/31/2019] [N/A:N/A] Date Acquired: [9RR:44] [N/A:N/A] Weeks of Treatment: [9RR:Healed - Epithelialized] [N/A:N/A] Wound Status: [9RR:Yes] [N/A:N/A] Wound Recurrence: [9RR:0x0x0] [N/A:N/A] Measurements L x W x D (cm) [9RR:0] [N/A:N/A] A (cm) : rea [9RR:0] [N/A:N/A] Volume (cm) : [9RR:100.00%] [N/A:N/A] % Reduction in A rea: [9RR:100.00%] [N/A:N/A] % Reduction in Volume: [9RR:Full Thickness Without Exposed] [N/A:N/A] Classification: [9RR:Support Structures] Treatment Notes Electronic Signature(s) Signed: 02/27/2020 5:51:03 PM By: Kela Millin Signed: 02/27/2020 6:02:27 PM By: Linton Ham MD Entered By: Linton Ham on 02/27/2020 19:62:22 -------------------------------------------------------------------------------- Multi-Disciplinary Care Plan Details Patient Name: Date of Service: [name] Freeze, [place] [name] 02/27/2020 7:30 A M Medical Record Number:  [id number] Patient Account Number: [id number] Date of Birth/Sex: Treating RN: 23-Jun-1976 (44 y.o. [name] Primary Care Jazier Mcglamery: [name], [name] Other Clinician: Referring Cheryllynn Sarff: Treating Antwoin Lackey/Extender: Otis Brace, [name] in Treatment: 14 Active Inactive Wound/Skin Impairment Nursing Diagnoses: Knowledge deficit related to ulceration/compromised skin integrity Goals: Patient/caregiver will verbalize understanding of skin care regimen Date Initiated: 07/22/2019 Target Resolution Date: 03/05/2020 Goal Status: Active Ulcer/skin breakdown will have a volume reduction of 30% by week 4 Date Initiated: 07/22/2019 Date Inactivated: 08/26/2019 Target Resolution Date: 08/22/2019 Goal Status: Met Ulcer/skin breakdown will have a volume reduction of 50% by week 8 Date Initiated: 08/26/2019 Date Inactivated: 09/30/2019 Target Resolution Date: 09/19/2019 Goal Status: Unmet Unmet Reason: comorbities Ulcer/skin breakdown will have a volume reduction of 80% by week 12 Date Initiated: 09/30/2019 Date Inactivated: 02/27/2020 Target Resolution Date: 02/13/2020 Goal Status: Met Interventions: Assess patient/caregiver ability to obtain necessary supplies Assess patient/caregiver ability to perform ulcer/skin care regimen upon admission and as needed Assess ulceration(s) every visit Notes: Electronic Signature(s) Signed: 02/27/2020 5:51:03 PM By: Kela Millin Entered By: Kela Millin on 02/27/2020 08:11:25 -------------------------------------------------------------------------------- Pain Assessment Details Patient Name: Date of Service: [name] Freeze, [place] [name] 02/27/2020 7:30 A M Medical Record Number: [id number] Patient Account Number: [id number] Date of Birth/Sex: Treating RN: [birth year] (44 y.o. [name] Primary Care Thurma Priego: [name], [name] Other Clinician: Referring Georgie Haque: Treating Shenouda Genova/Extender: Otis Brace,  [name] in Treatment: 66 Active Problems Location of Pain Severity and Description of Pain Patient Has Paino Yes Site Locations Rate the pain. Current Pain Level: 3 Worst Pain Level: 10 Least Pain Level: 0 Tolerable Pain Level: 8 Pain Management and Medication Current Pain Management: Medication: No Cold Application: No Rest: No Massage: No Activity: No T.E.N.S.: No Heat Application: No Leg drop or elevation: No Is the Current Pain Management Adequate: Adequate How does your wound impact your activities of daily livingo Sleep: No Bathing: No Appetite: No Relationship With Others: No Bladder Continence: No Emotions: No Bladder Continence: No Emotions: No Bowel Continence: No Work: No Toileting: No Drive: No Dressing: No Hobbies: No Electronic Signature(s) Signed: 02/27/2020 5:38:02 PM By: Deon Pilling Signed: 02/27/2020 5:51:03 PM By: Kela Millin Entered By: Deon Pilling on 02/27/2020 07:50:23 -------------------------------------------------------------------------------- Patient/Caregiver Education Details Patient Name: Date of Service: [name] Freeze, [place] [name] 4/30/2021andnbsp7:30 A M Medical Record Number: [id number] Patient Account Number: [id number] Date of Birth/Gender: Treating RN: 1976-08-11 (44 y.o. [name] Primary Care Physician: [name], [name] Other Clinician: Referring Physician: Treating Physician/Extender: Otis Brace, [name] in Treatment: 32 Education Assessment Education Provided To: Patient Education Topics Provided Venous: Handouts: Controlling Swelling with Multilayered Compression Wraps Methods: Explain/Verbal Responses: State content correctly Electronic Signature(s) Signed: 02/27/2020 5:51:03 PM By: Kela Millin Entered By: Kela Millin on 02/27/2020  08:11:47 -------------------------------------------------------------------------------- Wound Assessment Details Patient Name: Date of  Service: [name], [place] [name] 02/27/2020 7:30 A M Medical Record Number: [id number] Patient Account Number: [id number] Date of Birth/Sex: Treating RN: August 10, 1976 (44 y.o. [name] Primary Care Shaya Altamura: [name], [name] Other Clinician: Referring Athelene Hursey: Treating Arber Wiemers/Extender: Otis Brace, [name] in Treatment: 44 Wound Status Wound Number: [id number] Primary Etiology: Venous Leg Ulcer Wound Location: Left, Anterior Lower Leg Wound Status: Healed - Epithelialized Wounding Event: Gradually Appeared Date Acquired: 03/31/2019 Weeks Of Treatment: 44 Clustered Wound: No Wound Measurements Length: (cm) Width: (cm) Depth: (cm) Area: (cm) Volume: (cm) 0 % Reduction in Area: 100% 0 % Reduction in Volume: 100% 0 0 0 Wound Description Classification: Full Thickness Without Exposed Support Structur es Electronic Signature(s) Signed: 02/27/2020 5:38:02 PM By: Deon Pilling Signed: 02/27/2020 5:51:03 PM By: Kela Millin Entered By: Deon Pilling on 02/27/2020 07:50:57 -------------------------------------------------------------------------------- Vitals Details Patient Name: Date of Service: [name] Freeze, [place] [name] 02/27/2020 7:30 A M Medical Record Number: [id number] Patient Account Number: [id number] Date of Birth/Sex: Treating RN: Oct 25, 1976 (44 y.o. [name] Primary Care Miklos Bidinger: [name], [name] Other Clinician: Referring Leanthony Rhett: Treating Samanta Gal/Extender: Otis Brace, [name] in Treatment: 7 Vital Signs Time Taken: 07:48 Temperature (F): 98.8 Height (in): 74 Pulse (bpm): 91 Weight (lbs): 325 Respiratory Rate (breaths/min): 18 Body Mass Index (BMI): 41.7 Blood Pressure (mmHg): 157/87 Reference Range: 80 - 120 mg / dl Electronic Signature(s) Signed: 02/27/2020 5:38:02 PM By: Deon Pilling Entered By: Deon Pilling on 02/27/2020 07:49:46

## 2020-02-27 NOTE — Progress Notes (Signed)
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 silver collagen to the major area on the left anterior tibia that continues to get smaller Gi Or Norman is developed a additional wound on the left medial foot. Undoubtedly this was probably a break in his skin that became secondarily infected. Very painful I gave him doxycycline last week he is still saying that this is painful but not quite as bad as last time 5/11; using silver collagen to the major area in the left anterior tibia ooThe area on the left medial foot culture grew methicillin sensitive staph aureus I gave him 10 days in total of doxycycline which should have covered this. 5/18-.Returns at 1 week, has been in 4 layer compression on the left,  using alginate for the left leg and Prisma on the left foot READMISSION 6/25 He returns to  clinic today with a 3 week history of an opening on the left anterior mid tibia area. He has been applying silver collagen to this. When he left our clinic we ordered him 30-40 over the toe stockings. He states his edema is under as good control in this leg as he is ever seen it. Over the last 2 days he has developed an area on the left medial ankle/foot. This is the area that we worked on last time they cultured staph I gave him antibiotics and this closed over. The patient has a history of pulmonary embolism with an IVC filter. 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 He is on chronic anticoagulation. 7/2; the area on the left mid tibia did not have a viable surface today somewhat surprising adherent black necrotic surface. Not even really eschared. No evidence of infection. We did silver alginate last week 7/9; left mid tibia somewhat improved surface and slightly smaller. Using Iodoflex. 7/16 left mid tibia. Continues to [name] slightly smaller with improved surface using Iodoflex. He did not see Dr. Trula Ore with regards to the CT scan on the [name] leg because his insurance did not approve it 7/23; left mid tibia. No change in surface area however the wound surface looks better. He has another small area superiorly which is a small hyper granulated lesion. But this is of unclear etiology however it is defined is new this week. He also had significant bleeding reported by our intake nurse the exact source of this was unclear. 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 His original wound looks quite a bit better and how it every he is developed over the last 3 weeks a raised painful area just above the wound. I am not sure if this represents a primary cutaneous issue or a venous issue. He seems to have a palpable vein underneath this which is tender may represent superficial  venous thrombosis. He is already on Xarelto. 8/6-Patient returns at 1 week for his left mid tibial wounds, the more proximal of the 2 wounds was raised painful area described last time, patient states this is less painful than before, he is almost completed his doxycycline, the wound area is larger but the wound itself is less painful. 8/13-Left anterior leg wound looks smaller and overall making good progress the other wound has healed. We are using Hydrofera Blue 8/20; the patient has 2 open areas. The original inferior area and the new area superiorly. He has been using Hydrofera Blue ready but the wounds overall look dry. He has not managed to get a CT scan approved and hence has not seen Dr. Trula Ore at [facility] 8/27 most of the patient's wound areas on the anterior look epithelialized. There is still some eschar and vulnerability here. He has been wearing to press stockings which should give him 30/40 mmHg compression. He also has compression pumps that belonged to his father. I think it would [name] reasonable to try and get him external compression pumps. He was not using the compression pumps daily and these certainly need to [name] used daily. Finally he was approved for his CT scan on the [name] with follow-up with Dr. Trula Ore. We will asked Dr. Trula Ore to look at his left leg as well 9/3; the patient had  2 wound areas. Central tibial area of area is healed. Smaller wound superiorly still perhaps slightly open. I believe his CT scan on the [name] hip and pelvis areas on the 18th. That was ordered by Dr. Trula Ore. We have ordered him 30/40 stockings and are going to attempt to get him compression pumps 9/10-Patient returns at 1 week for the 2 small areas on his left anterior leg, these areas are healed. Patient is now going to going to the 30/40 stockings. His CT scan of his [name] leg hip and pelvis areas is scheduled 9/22 the patient was discharged from the clinic on 9/10. He tells me that he started  developing pain on the left anterior tibial area and swelling on Thursday of last week. By Saturday the swelling was so prominent that he was scared to take his stocking off because he would [name] able to get it back on. There was increased pain. He was seen in the emergency department at [facility] on 9/1. 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 He was also felt to have cellulitis. There was apparently not any acute clot. He I he was put on a 3 time a day medication/antibiotic which I am assuming is clindamycin. He has 2 reopened areas both on the anterior tibia 9/29; he completed the clindamycin even though it caused diarrhea. The diarrhea is resolved. The cellulitis in the left leg that was prominent last week has resolved. He also had a CT scan of the pelvis done which I think did not show anything on the [name] but did show obstruction of theo Common iliac vein on the left [he says in close proximity to the IVC]. I looked in  link I do not see the actual CT scan report. He is being apparently scheduled for an attempt at stenting retrograde and then if that does not work anterior grade by Dr. Trula Ore. We use silver alginate to the wound last week because of coexistent cellulitis 10/6; cellulitis on the left leg resolved. He is still waiting for insurance authorization for his pelvic vein stenting. Changed him to [facility] last week 10/13; cellulitis remains resolved. Wound is smaller. Using Hydrofera Blue under compression. In 2 days time he is going for his venous stenting hopefully by Dr. Trula Ore at [facility] 10/20; the patient went for his procedure last Thursday by Dr. Trula Ore although he does not remember in the postop period what he was told. He is not certain whether Dr. Trula Ore managed to get the stent then successfully. His wound is  measuring smaller looks healthy on the left mid tibia. He is also having some discomfort behind his left knee that he had me look at which is the site where Dr. Trula Ore had venous access. I looked in care everywhere. I could not see a specific note from Dr. Trula Ore [[facility]] above the actual procedure. Presumably it is still in transfer therefore I could not really answer his question about whether the stent was placed successful 10/27; wound not quite as good as last week. Using Hydrofera Blue. He thinks it might of stuck to the wound and was adherent when they took it off today. His procedure with Dr. Trula Ore was not successful. He thinks that Dr. Trula Ore will try to go at this from the other direction at some point. He sees him in 2- 1/2-week 11/3; quite an improvement in wound surface  area this week. We are using Hydrofera Blue. No need to change the dressing. Follows up with Dr. Trula Ore in about a week and a half 11/10; wound surface not as good this week at all. No changes in dimensions. We have been using Hydrofera Blue. As well he had tells [place] he had discomfort in his foot all week although he admits he was up on this more than usual. He comes in today with 2 small punched out areas on the left lateral foot. He has severe venous hypertension. He sees Dr. Trula Ore again on 11/16 11/17; not much change in the area on the anterior tibia mid aspect. He also has 2 areas on the left lateral foot. We have been using Sorbact on the left anterior and Iodosorb ointment on the left foot. He is under 4-layer compression. The patient went to see Dr. Trula Ore I do not yet have access to this note and care everywhere. However according to the patient there is an option to try and address his venous occlusion I think via a transjugular approach. This is complicated by the fact that the IVC filter is there and would have to [name] traversed. According to the patient there is about a 33% chance of perforation may [name]  even aortic perforation. However they would [name] prepared for this. He is thinking about this and discussing it with his wife Thanks to our case manager this week we are able to determine if for some reason the patient is not using his compression pumps. He also is not able to get stockings on the [name] leg which are 30/40 below-knee. I think we may need to order external compression garments 12/1; patient arrives with the area on the left anterior mid tibia expanded superiorly small rim of skin between the original area and the new area. He has painful small denuded areas on the left medial and left lateral heel. We have been using Iodoflex. His next appointment with the Dr. Trula Ore is January 4. after the holidays to discuss if he would like to pursue this endovascular option. Dr. Sherrin Daisy notes below from last visit Subjective Lydia Meng is a 44 y.o. 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 Unfortunately during the procedure a stent was not able to [name] placed d/t narrowing at the IVC. Per the patient the procedure provided 2-3 days of symptomatic relief (decrease in L LE pressure and pain) but after that his symptoms returned with increased pressure, pain in the hips and pain in the legs when standing. The patient does report his L LE wound is stable especially when he is consistently attending his wound care clinic in [place]. The patient denies using compressive LE therapies unless done at his wound clinic due to not being able to reach his legs. He is interested in learning more and possibly pursing further interventional options for his L lower extremity via access through neck. *Objective Physical Exam: BP 171/86  Pulse 80  T emp 37 C (T emporal)  Wt (!)  147.4 kg (325 lb)  BMI 41.73 kg/m General: awake, alert and oriented x 3 Chest: Non labored breathing on room air CVS: Normal rate. Regular rhythm. ABD: soft, NT ND, Ext: bilateral leg edema w/ left leg in compressive dressing from wound clinic. Evidence  of skin discoloration on both feet bilaterally. Data Review: Labs: None to review Imaging:Reviewed report of 10/15 L LE venogram. I saw and evaluated the patient, participating in the key portions of the service. I reviewed the residentoos note. I agree with the residentoos findings and plan. Electronically signed by Derry Skill, MD at 09/16/2019 10:53 AM EST 12/11 the areas around his ankles have healed. 2 areas in the center part of the mid tibia area are still open we have been using silver collagen. Procedure attempt by Dr. Trula Ore at [facility] on 11/03/2019 12/18; the wounds around his ankles remain closed. I think there has been some improvement in the 2 areas in the center part of the mid tibia. Especially superiorly. Debris on the center required debridement we have been using silver collagen 12/31; I saw the patient briefly last week when he was here for a nurse visit. He had irritation in which look believed to [name] localized cellulitis and folliculitis on the lateral left calf. I gave him a prescription for cephalexin although he admits today he never filled it. The area still looked very angry. His original wounds we have been treating where a figure-of-eight shaped area on the anterior tibial area mid aspect. We have been using silver collagen these do not look healthy. 11/07/2019. The patient has completed his antibiotics. The area of folliculitis on the left lateral calf looks somewhat better although there is still erythema there is no tenderness. I am going to put some more compression on this area and will have a look at this next week. No additional antibiotics. Since the patient was last here he went to see Dr. Trula Ore at  [facility]. I am able to see his note from 11/03/2019 in care everywhere. The patient has a history of a [name] iliac vein stent and IVC filter placement. He is status post angioplasty of the left common iliac vein on 08/14/2019 for chronic obstruction and venous insufficiency. Unfortunately a stent could not [name] placed during the procedure due to narrowing at the IVC from fibrotic change of the [name] iliac vein stent. As I understand things they are now going to approach this from both sides i.e. through an area in the internal jugular vein as well as the more standard distal approach and see if this area can [name] stented. The thought was 5 to 6 weeks before this could [name] arranged as it will "require 2 surgeons, insurance approval etc. The original wound on his anterior tibia looks better. He has the area on the left lateral tibia with localized swelling. He has a new area on the left medial calcaneus which is small punched out and very painful. I think this looks similar to wounds he has had in this area before. He has severe venous hypertension 1/15; anterior tibia wound has come down from a figure-of-eight to 1 wound. Has some depth which looks clean. The area on the left lateral calf looks better. We put additional compression in here and the edema in this area looks better. His major complaint is on the medial calcaneus. He had mentioned this last week and he has had wounds like this before. Pain is severe 1/22; anterior tibial wound seems to [name] closing at with shallower and better looking wound margin. He has an area anteriorly that I think was initially an area of folliculitis the area on the left lateral calf has closed over The small area on the medial heel that I cultured last week grew methicillin sensitive staph  aureus but resistant to the doxycycline I gave him. I substituted cephalexin 500 every 6 and has only been on this for 2 days. Still says the pain is very significant 1/29; all the patient's  wounds look quite good today including the anterior tibial which is closing in. The area above this also closing in finally the very painful area on the medial heel. He is completing antibiotics today this cultured methicillin sensitive staph aureus. Back to using silver alginate on the wounds 2/12; in general things are quite good here. He still does not have a date for attempting to stand his left common iliac vein. Still an insurance issue. I have been encouraged him to move on this by calling his insurance company. There is not a good option here. 2/26; continued nice improvement. He still has the open area in the mid tibia that is incompletely closed. Surprisingly today the difficult area on his left medial calcaneus that was so painful and inflamed has epithelialized over. 3/5; the patient has his stenting procedure attempt at [facility] on 3/25. He continues to make nice progress the only remaining wound is on the left anterior tibial area. His heel medially remains epithelialized. We have been using Sorbac 3/19; his stenting procedure is actually on 4/15 per the patient today. Unfortunately he was not here last week and did not use his pumps. He has an area of localized edema erythema and marked tenderness above where the patient claims the original wound was. T [name] truthful we all had trouble determining what was o new wound today and what was original wound. Patient thought it was the more distal medial area where is the more central area is what my nurse and I thought it was. In any case looking back on the pictures really did not clarify things. He has a small punched out area just medial to the tibia which the patient claims was his original wound superiorly he now has an open area which is superficial some satellite lesions very significant erythema and tenderness more centrally and superiorly to what is supposedly his original wound. A lot of tenderness here. We have been using Sorbact I changed  to silver alginate today. He will need antibiotic 3/22; back early for review of probable cellulitis in the mid tibia area. This also could have been localized stasis dermatitis from not changing his wrap and not using compression. We put him on doxycycline empirically things look better he is in less pain no systemic illness 3/26; in for his usual appointment. The cellulitis/localized stasis dermatitis we are concerned about a week ago has resolved. He is completing the doxycycline. I really think this was localized poor edema control rather than cellulitis although these things are difficult to [name] certain. Will use silver alginate on the remaining wound which looks healthy on the left anterior mid tibia 4/9; the cellulitis stasis dermatitis week concerned about 2 weeks ago is still resolved. His original wound in the mid tibia area is remains healed the area we are dealing with current currently is above this area by about 3 or 4 cm. Clean looking wound. No need for debridement. We have been using silver alginate The patient's attempt to recanalize his left common iliac vein is next Thursday. This is to [name] done at [facility] by Dr. Trula Ore and colleagues my understanding is that they will approach this from both sides 4/30; the patient underwent an extensive venous revascularization by Dr. Trula Ore at [facility] on 02/12/2020 this included recanalization of the  inferior vena cava, endovascular stenting of the inferior vena cava of the left common iliac vein the left iliac vein and the left femoral vein and venoplasty of the inferior vena cava left iliac vein common femoral vein [name] iliac vein and [name] femoral vein. The patient did well. He is now on a months worth of Lovenox and lieu of his Xarelto. He is not on any other antiplatelet agents. We have been wrapping his leg with 4-layer compression. According to our nurses he had a nurse visit last week and was closed he remains closed but is concerned about some  degree of swelling in the left leg. He is not in any pain but says he is hypersensitive Objective Constitutional Patient is hypertensive.. Pulse regular and within target range for patient.Marland Kitchen Respirations regular, non-labored and within target range.. Temperature is normal and within the target range for the patient.Marland Kitchen Appears in no distress. Vitals Time Taken: 7:48 AM, Height: 74 in, Weight: 325 lbs, BMI: 41.7, Temperature: 98.8 F, Pulse: 91 bpm, Respiratory Rate: 18 breaths/min, Blood Pressure: 157/87 mmHg. Respiratory work of breathing is normal. Cardiovascular Pedal pulses are palpable on the left. General Notes: Wound exam; there is no open area in the left leg some very superficial scabbing/eschar. There is some swelling in his leg but no tenderness he says this is worse than it and after he first had the procedure but nothing really seems acute here. Peripheral pulses are palpable no other open wounds Integumentary (Hair, Skin) Changes of severe chronic venous insufficiency. Wound #9RR status is Healed - Epithelialized. Original cause of wound was Gradually Appeared. The wound is located on the Left,Anterior Lower Leg. The wound measures 0cm length x 0cm width x 0cm depth; 0cm^2 area and 0cm^3 volume. Assessment Active Problems ICD-10 Non-pressure chronic ulcer of other part of left lower leg limited to breakdown of skin Chronic venous hypertension (idiopathic) with inflammation of left lower extremity Cellulitis of left lower limb Lymphedema, not elsewhere classified Other thrombophilia Procedures There was a Four Layer Compression Therapy Procedure by Shawn Stall, RN. Post procedure Diagnosis Wound #: Same as Pre-Procedure Plan Follow-up Appointments: Return Appointment in 1 week. - Friday Dressing Change Frequency: Do not change entire dressing for one week. Skin Barriers/Peri-Wound Care: Moisturizing lotion TCA Cream or Ointment - mixed with lotion Wound  Cleansing: May shower with protection. Edema Control: 4 layer compression: Left lower extremity Avoid standing for long periods of time Elevate legs to the level of the heart or above for 30 minutes daily and/or when sitting, a frequency of: - throughout the day Support Garment 30-40 mm/Hg pressure to: - [name] leg : apply compression stockings dual layer in the am and remove at night. Segmental Compressive Device. - lymphedema pumps twice a day for 1 hour each time 1. The patient was really very concerned about leaving this on wrap this week. I put him back in 4 layer compression but I told him to bring his 30/40 below- knee stockings next week we are going to have to make an attempt to transition him into stockings. 2. He tells me the stockings are about a month old in terms of use although he has not used them in some time we will see if this will [name] able to [name] used 3. His skin is always going to [name] very fragile. I told him that below-knee stockings standardly have a shelf life of about 4 to 5 months Electronic Signature(s) Signed: 02/27/2020 6:02:27 PM By: Baltazar Najjar MD Entered By:  Baltazar Najjar on 02/27/2020 08:32:19 -------------------------------------------------------------------------------- SuperBill Details Patient Name: Date of Service: [name], [name] 02/27/2020 Medical Record Number: [id number] Patient Account Number: [id number] Date of Birth/Sex: Treating RN: April 18, 1976 (44 y.o. [name] Primary Care Provider: [name], [name] Other Clinician: Referring Provider: Treating Provider/Extender: Carola Rhine, [name] in Treatment: 44 Diagnosis Coding ICD-10 Codes Code Description [phone] Non-pressure chronic ulcer of other part of left lower leg limited to breakdown of skin I87.322 Chronic venous hypertension (idiopathic) with inflammation of left lower extremity L03.116 Cellulitis of left lower limb I89.0 Lymphedema, not elsewhere  classified D68.69 Other thrombophilia Facility Procedures CPT4 Code: [id number] Description: (Facility Use Only) [phone] - APPLY MULTLAY COMPRS LWR LT LEG Modifier: Quantity: 1 Physician Procedures : CPT4 Code Description Modifier [id number] - WC PHYS LEVEL 3 - EST PT ICD-10 Diagnosis Description L97.821 Non-pressure chronic ulcer of other part of left lower leg limited to breakdown of skin I87.322 Chronic venous hypertension (idiopathic) with  inflammation of left lower extremity I89.0 Lymphedema, not elsewhere classified Quantity: 1 Electronic Signature(s) Signed: 02/27/2020 6:02:27 PM By: Baltazar Najjar MD Entered By: Baltazar Najjar on 02/27/2020 08:32:38

## 2020-03-05 ENCOUNTER — Encounter (HOSPITAL_BASED_OUTPATIENT_CLINIC_OR_DEPARTMENT_OTHER): Payer: 59 | Attending: Internal Medicine | Admitting: Internal Medicine

## 2020-03-05 DIAGNOSIS — I87322 Chronic venous hypertension (idiopathic) with inflammation of left lower extremity: Secondary | ICD-10-CM | POA: Diagnosis not present

## 2020-03-05 DIAGNOSIS — L97821 Non-pressure chronic ulcer of other part of left lower leg limited to breakdown of skin: Secondary | ICD-10-CM | POA: Diagnosis present

## 2020-03-05 DIAGNOSIS — Z86711 Personal history of pulmonary embolism: Secondary | ICD-10-CM | POA: Diagnosis not present

## 2020-03-05 DIAGNOSIS — Z95818 Presence of other cardiac implants and grafts: Secondary | ICD-10-CM | POA: Diagnosis not present

## 2020-03-05 DIAGNOSIS — Z7901 Long term (current) use of anticoagulants: Secondary | ICD-10-CM | POA: Diagnosis not present

## 2020-03-05 DIAGNOSIS — I890 Lymphedema, not elsewhere classified: Secondary | ICD-10-CM | POA: Insufficient documentation

## 2020-03-05 DIAGNOSIS — L03116 Cellulitis of left lower limb: Secondary | ICD-10-CM | POA: Diagnosis not present

## 2020-03-05 DIAGNOSIS — D6869 Other thrombophilia: Secondary | ICD-10-CM | POA: Insufficient documentation

## 2020-03-05 NOTE — Progress Notes (Signed)
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 this area. 5/4; using silver collagen to the major area on the left anterior tibia that continues to get smaller [facility] is developed a additional wound on the left medial foot. Undoubtedly this was probably a break in his skin that became secondarily infected. Very painful I gave him doxycycline last week he is still saying that this is painful but not quite as bad as last time 5/11; using silver collagen to the major area in the left anterior tibia ooThe area on the left medial foot culture grew methicillin sensitive staph aureus I gave him 10 days in total of doxycycline which should have covered this.  5/18-.Returns at 1 week, has been in 4 layer compression on the left, using alginate for the left leg and Prisma on the left foot READMISSION 6/25 He returns to  clinic today with a 3 week history of an opening on the left anterior mid tibia area. He has been applying silver collagen to this. When he left our clinic we ordered him 30-40 over the toe stockings. He states his edema is under as good control in this leg as he is ever seen it. Over the last 2 days he has developed an area on the left medial ankle/foot. This is the area that we worked on last time they cultured staph I gave him antibiotics and this closed over. The patient has a history of pulmonary embolism with an IVC filter. 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 He is on chronic anticoagulation. 7/2; the area on the left mid tibia did not have a viable surface today somewhat surprising adherent black necrotic surface. Not even really eschared. No evidence of infection. We did silver alginate last week 7/9; left mid tibia somewhat improved surface and slightly smaller. Using Iodoflex. 7/16 left mid tibia. Continues to [name] slightly smaller with improved surface using Iodoflex. He did not see Dr. Gladys Damme with regards to the CT scan on the right leg because his insurance did not approve it 7/23; left mid tibia. No change in surface area however the wound surface looks better. He has another small area superiorly which is a small hyper granulated lesion. But this is of unclear etiology however it is defined is new this week. He also had significant bleeding reported by our intake nurse the exact source of this was unclear. 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 His original wound looks quite a bit better and how it every he is developed over the last 3 [name] a raised painful area just above the wound. I am not sure if this represents a primary cutaneous issue or a venous issue. He seems to have a palpable vein underneath this which is tender may represent superficial  venous thrombosis. He is already on Xarelto. 8/6-Patient returns at 1 week for his left mid tibial wounds, the more proximal of the 2 wounds was raised painful area described last time, patient states this is less painful than before, he is almost completed his doxycycline, the wound area is larger but the wound itself is less painful. 8/13-Left anterior leg wound looks smaller and overall making good progress the other wound has healed. We are using Hydrofera Blue 8/20; the patient has 2 open areas. The original inferior area and the new area superiorly. He has been using Hydrofera Blue ready but the wounds overall look dry. He has not managed to get a CT scan approved and hence has not seen Dr. Gladys Damme at [facility] 8/27 most of the patient's wound areas on the anterior look epithelialized. There is still some eschar and vulnerability here. He has been wearing to press stockings which should give him 30/40 mmHg compression. He also has compression pumps that belonged to his father. I think it would [name] reasonable to try and get him external compression pumps. He was not using the compression pumps daily and these certainly need to [name] used daily. Finally he was approved for his CT scan on the right with follow-up with Dr. Gladys Damme. We will asked Dr.  Marsden to look at his left leg as well 9/3; the patient had 2 wound areas. Central tibial area of area is healed. Smaller wound superiorly still perhaps slightly open. I believe his CT scan on the right hip and pelvis areas on the 18th. That was ordered by Dr. Trula Ore. We have ordered him 30/40 stockings and are going to attempt to get him compression pumps 9/10-Patient returns at 1 week for the 2 small areas on his left anterior leg, these areas are healed. Patient is now going to going to the 30/40 stockings. His CT scan of his right leg hip and pelvis areas is scheduled 9/22 the patient was discharged from the clinic on 9/10. He tells me that he started  developing pain on the left anterior tibial area and swelling on Thursday of last week. By Saturday the swelling was so prominent that he was scared to take his stocking off because he would [name] able to get it back on. There was increased pain. He was seen in the emergency department at [facility] on 9/1. 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 He was also felt to have cellulitis. There was apparently not any acute clot. He I he was put on a 3 time a day medication/antibiotic which I am assuming is clindamycin. He has 2 reopened areas both on the anterior tibia 9/29; he completed the clindamycin even though it caused diarrhea. The diarrhea is resolved. The cellulitis in the left leg that was prominent last week has resolved. 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 I looked in [place] link I do not see the actual CT scan report. He is being apparently scheduled for an attempt at stenting retrograde and then if that does not work anterior grade by Dr. Trula Ore. We use silver alginate to the wound last week because of coexistent cellulitis 10/6; cellulitis on the left leg resolved. He is still waiting for insurance authorization for his pelvic vein stenting. Changed him to [facility] last week 10/13; cellulitis remains resolved. Wound is smaller. Using Hydrofera Blue under compression. In 2 days time he is going for his venous stenting hopefully by Dr. Trula Ore at [facility] 10/20; the patient went for his procedure last Thursday by Dr. Trula Ore although he does not remember in the postop period what he was told. He is not certain whether Dr. Trula Ore managed to get the stent then successfully. His wound is  measuring smaller looks healthy on the left mid tibia. He is also having some discomfort behind his left knee that he had me look at which is the site where Dr. Trula Ore had venous access. I looked in care everywhere. I could not see a specific note from Dr. Trula Ore [[facility]] above the actual procedure. Presumably it is still in transfer therefore I could not really answer his question about whether the stent was placed successful 10/27; wound not quite as good as last week. Using Hydrofera Blue. He thinks it might of stuck to the wound and was adherent when they took it off today. His procedure with Dr. Trula Ore was not successful. He thinks that Dr. Trula Ore will try to go at this from the other direction at some point.  He sees him in 2- 1/2-week 11/3; quite an improvement in wound surface area this week. We are using Hydrofera Blue. No need to change the dressing. Follows up with Dr. Trula Ore in about a week and a half 11/10; wound surface not as good this week at all. No changes in dimensions. We have been using Hydrofera Blue. As well he had tells [place] he had discomfort in his foot all week although he admits he was up on this more than usual. He comes in today with 2 small punched out areas on the left lateral foot. He has severe venous hypertension. He sees Dr. Trula Ore again on 11/16 11/17; not much change in the area on the anterior tibia mid aspect. He also has 2 areas on the left lateral foot. We have been using Sorbact on the left anterior and Iodosorb ointment on the left foot. He is under 4-layer compression. The patient went to see Dr. Trula Ore I do not yet have access to this note and care everywhere. However according to the patient there is an option to try and address his venous occlusion I think via a transjugular approach. This is complicated by the fact that the IVC filter is there and would have to [name] traversed. According to the patient there is about a 33% chance of perforation may [name]  even aortic perforation. However they would [name] prepared for this. He is thinking about this and discussing it with his wife Thanks to our case manager this week we are able to determine if for some reason the patient is not using his compression pumps. He also is not able to get stockings on the right leg which are 30/40 below-knee. I think we may need to order external compression garments 12/1; patient arrives with the area on the left anterior mid tibia expanded superiorly small rim of skin between the original area and the new area. He has painful small denuded areas on the left medial and left lateral heel. We have been using Iodoflex. His next appointment with the Dr. Trula Ore is January 4. after the holidays to discuss if he would like to pursue this endovascular option. Dr. Sherrin Daisy notes below from last visit Subjective [name] is a 44 y.o. 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 Unfortunately during the procedure a stent was not able to [name] placed d/t narrowing at the IVC. Per the patient the procedure provided 2-3 days of symptomatic relief (decrease in L LE pressure and pain) but after that his symptoms returned with increased pressure, pain in the hips and pain in the legs when standing. The patient does report his L LE wound is stable especially when he is consistently attending his wound care clinic in [place]. The patient denies using compressive LE therapies unless done at his wound clinic due to not being able to reach his legs. He is interested in learning more and possibly pursing further interventional options for his L lower extremity via access through neck. *Objective Physical Exam: BP 171/86  Pulse 80  T emp 37 C (T emporal)  Wt (!)  147.4 kg (325 lb)  BMI 41.73 kg/m General: awake, alert and oriented x 3 Chest: Non labored breathing on room air CVS: Normal rate. Regular rhythm. ABD: soft, NT ND, Ext:  bilateral leg edema w/ left leg in compressive dressing from wound clinic. Evidence of skin discoloration on both feet bilaterally. Data Review: Labs: None to review Imaging:Reviewed report of 10/15 L LE venogram. I saw and evaluated the patient, participating in the key portions of the service. I reviewed the residentoos note. I agree with the residentoos findings and plan. Electronically signed by Derry Skill, MD at 09/16/2019 10:53 AM EST 12/11 the areas around his ankles have healed. 2 areas in the center part of the mid tibia area are still open we have been using silver collagen. Procedure attempt by Dr. Trula Ore at [facility] on 11/03/2019 12/18; the wounds around his ankles remain closed. I think there has been some improvement in the 2 areas in the center part of the mid tibia. Especially superiorly. Debris on the center required debridement we have been using silver collagen 12/31; I saw the patient briefly last week when he was here for a nurse visit. He had irritation in which look believed to [name] localized cellulitis and folliculitis on the lateral left calf. I gave him a prescription for cephalexin although he admits today he never filled it. The area still looked very angry. His original wounds we have been treating where a figure-of-eight shaped area on the anterior tibial area mid aspect. We have been using silver collagen these do not look healthy. 11/07/2019. The patient has completed his antibiotics. The area of folliculitis on the left lateral calf looks somewhat better although there is still erythema there is no tenderness. I am going to put some more compression on this area and will have a look at this next week. No additional antibiotics. Since the patient was last here he went to see Dr. Trula Ore at  [facility]. I am able to see his note from 11/03/2019 in care everywhere. The patient has a history of a right iliac vein stent and IVC filter placement. He is status post angioplasty of the left common iliac vein on 08/14/2019 for chronic obstruction and venous insufficiency. Unfortunately a stent could not [name] placed during the procedure due to narrowing at the IVC from fibrotic change of the right iliac vein stent. As I understand things they are now going to approach this from both sides i.e. through an area in the internal jugular vein as well as the more standard distal approach and see if this area can [name] stented. The thought was 5 to 6 [name] before this could [name] arranged as it will "require 2 surgeons, insurance approval etc. The original wound on his anterior tibia looks better. He has the area on the left lateral tibia with localized swelling. He has a new area on the left medial calcaneus which is small punched out and very painful. I think this looks similar to wounds he has had in this area before. He has severe venous hypertension 1/15; anterior tibia wound has come down from a figure-of-eight to 1 wound. Has some depth which looks clean. The area on the left lateral calf looks better. We put additional compression in here and the edema in this area looks better. His major complaint is on the medial calcaneus. He had mentioned this last week and he has had wounds like this before. Pain is severe 1/22; anterior tibial wound seems to [name] closing at with shallower and better looking wound margin. He has an area anteriorly that I think was initially an area of folliculitis the area on the left lateral calf has closed over The small area  on the medial heel that I cultured last week grew methicillin sensitive staph aureus but resistant to the doxycycline I gave him. I substituted cephalexin 500 every 6 and has only been on this for 2 days. Still says the pain is very significant 1/29; all the patient's  wounds look quite good today including the anterior tibial which is closing in. The area above this also closing in finally the very painful area on the medial heel. He is completing antibiotics today this cultured methicillin sensitive staph aureus. Back to using silver alginate on the wounds 2/12; in general things are quite good here. He still does not have a date for attempting to stand his left common iliac vein. Still an insurance issue. I have been encouraged him to move on this by calling his insurance company. There is not a good option here. 2/26; continued nice improvement. He still has the open area in the mid tibia that is incompletely closed. Surprisingly today the difficult area on his left medial calcaneus that was so painful and inflamed has epithelialized over. 3/5; the patient has his stenting procedure attempt at [facility] on 3/25. He continues to make nice progress the only remaining wound is on the left anterior tibial area. His heel medially remains epithelialized. We have been using Sorbac 3/19; his stenting procedure is actually on 4/15 per the patient today. Unfortunately he was not here last week and did not use his pumps. He has an area of localized edema erythema and marked tenderness above where the patient claims the original wound was. T [name] truthful we all had trouble determining what was o new wound today and what was original wound. Patient thought it was the more distal medial area where is the more central area is what my nurse and I thought it was. In any case looking back on the pictures really did not clarify things. He has a small punched out area just medial to the tibia which the patient claims was his original wound superiorly he now has an open area which is superficial some satellite lesions very significant erythema and tenderness more centrally and superiorly to what is supposedly his original wound. A lot of tenderness here. We have been using Sorbact I changed  to silver alginate today. He will need antibiotic 3/22; back early for review of probable cellulitis in the mid tibia area. This also could have been localized stasis dermatitis from not changing his wrap and not using compression. We put him on doxycycline empirically things look better he is in less pain no systemic illness 3/26; in for his usual appointment. The cellulitis/localized stasis dermatitis we are concerned about a week ago has resolved. He is completing the doxycycline. I really think this was localized poor edema control rather than cellulitis although these things are difficult to [name] certain. Will use silver alginate on the remaining wound which looks healthy on the left anterior mid tibia 4/9; the cellulitis stasis dermatitis week concerned about 2 [name] ago is still resolved. His original wound in the mid tibia area is remains healed the area we are dealing with current currently is above this area by about 3 or 4 cm. Clean looking wound. No need for debridement. We have been using silver alginate The patient's attempt to recanalize his left common iliac vein is next Thursday. This is to [name] done at [facility] by Dr. Trula Ore and colleagues my understanding is that they will approach this from both sides 4/30; the patient underwent an extensive venous  revascularization by Dr. Trula Ore at [facility] on 02/12/2020 this included recanalization of the inferior vena cava, endovascular stenting of the inferior vena cava of the left common iliac vein the left iliac vein and the left femoral vein and venoplasty of the inferior vena cava left iliac vein common femoral vein right iliac vein and right femoral vein. The patient did well. He is now on a months worth of Lovenox and lieu of his Xarelto. He is not on any other antiplatelet agents. We have been wrapping his leg with 4-layer compression. According to our nurses he had a nurse visit last week and was closed he remains closed but is concerned about some  degree of swelling in the left leg. He is not in any pain but says he is hypersensitive 5/7; the patient is status post an extensive venous revascularization by Dr. Trula Ore at [facility]. This included recanalization of the inferior vena cava, endovascular stenting of the left common iliac vein left iliac vein and left femoral vein. The patient has done very well. His wounds are all closed. He has not been able to get into his 30/40 below-knee stockings however he has extremitease stockings at home and I have suggested these. He also has compression pumps and I have asked him to consider using these once a day and monitor the swelling in his lower extremity Objective Constitutional Vitals Time Taken: 7:49 AM, Height: 74 in, Weight: 325 lbs, BMI: 41.7, Temperature: 98.6 F, Pulse: 103 bpm, Respiratory Rate: 20 breaths/min, Blood Pressure: 148/85 mmHg. Assessment Active Problems ICD-10 Non-pressure chronic ulcer of other part of left lower leg limited to breakdown of skin Chronic venous hypertension (idiopathic) with inflammation of left lower extremity Cellulitis of left lower limb Lymphedema, not elsewhere classified Other thrombophilia Plan Discharge From [facility] Services: Discharge from Wound Care Center Skin Barriers/Peri-Wound Care: Moisturizing lotion - to leg nightly Wound Cleansing: May shower and wash wound with soap and water. Edema Control: Avoid standing for long periods of time Elevate legs to the level of the heart or above for 30 minutes daily and/or when sitting, a frequency of: - throughout the day Support Garment 30-40 mm/Hg pressure to: - juxtalite compression garment to left leg daily, compression stocking to right leg daily Segmental Compressive Device. - lymphedema pumps twice a day for 1 hour each time 1. I think the patient can [name] discharged from the clinic 2. We have advised to use the extremitease stockings on the left and for that matter of the right leg on which she has  not had a previous problem. Extremity stocking at 30/40 compression 3. I have also recommended using the compression pumps he already has once a day. Monitoring his swelling and if the swelling is not controlled then perhaps twice a day . #4 he has an appointment with Dr. Trula Ore next Monday. 5. Remain available to the patient especially over the next month if he has any further breakdown. Hopefully not Electronic Signature(s) Signed: 03/05/2020 4:59:38 PM By: Baltazar Najjar MD Entered By: Baltazar Najjar on 03/05/2020 16:10:96 -------------------------------------------------------------------------------- SuperBill Details Patient Name: Date of Service: [name], [name] 03/05/2020 Medical Record Number: [id number] Patient Account Number: [id number] Date of Birth/Sex: Treating RN: 03-10-1976 (44 y.o. [name] Primary Care Provider: Docia [name], [name] Other Clinician: Referring Provider: Treating Provider/Extender: [name], [name] in Treatment: 44 Diagnosis Coding ICD-10 Codes Code Description [phone] Non-pressure chronic ulcer of other part of left lower leg limited to breakdown of skin I87.322 Chronic venous hypertension (idiopathic) with  inflammation of left lower extremity L03.116 Cellulitis of left lower limb I89.0 Lymphedema, not elsewhere classified D68.69 Other thrombophilia Facility Procedures CPT4 Code: [id number] Description: [phone] - WOUND CARE VISIT-LEV 2 EST PT Modifier: Quantity: 1 Physician Procedures : CPT4 Code Description Modifier [id number] - WC PHYS LEVEL 3 - EST PT ICD-10 Diagnosis Description L97.821 Non-pressure chronic ulcer of other part of left lower leg limited to breakdown of skin I87.322 Chronic venous hypertension (idiopathic) with  inflammation of left lower extremity I89.0 Lymphedema, not elsewhere classified Quantity: 1 Electronic Signature(s) Signed: 03/05/2020 4:59:38 PM By: Baltazar Najjar MD Entered By: Baltazar Najjar  on 03/05/2020 08:27:01

## 2020-03-05 NOTE — Progress Notes (Signed)
GEHRIG, PATRAS ([id number]) Visit Report for 03/05/2020 Arrival Information Details Patient Name: Date of Service: [place], [place] [name] 03/05/2020 7:30 A M Medical Record Number: [id number] Patient Account Number: [id number] Date of Birth/Sex: Treating RN: 28-Oct-1976 (44 y.o. [name] Primary Care Mahari Strahm: [name], [name] Other Clinician: Referring Farra Nikolic: Treating Dyesha Henault/Extender: Carola Rhine, [name] in Treatment: 45 Visit Information History Since Last Visit Added or deleted any medications: No Patient Arrived: Ambulatory Any new allergies or adverse reactions: No Arrival Time: 07:47 Had a fall or experienced change in No Accompanied By: self activities of daily living that may affect Transfer Assistance: None risk of falls: Patient Requires Transmission-Based Precautions: No Signs or symptoms of abuse/neglect since last visito No Patient Has Alerts: No Hospitalized since last visit: No Implantable device outside of the clinic excluding No cellular tissue based products placed in the center since last visit: Has Dressing in Place as Prescribed: No Has Compression in Place as Prescribed: No Pain Present Now: No Electronic Signature(s) Signed: 03/05/2020 4:59:21 PM By: Shawn Stall Entered By: Shawn Stall on 03/05/2020 07:51:02 -------------------------------------------------------------------------------- Clinic Level of Care Assessment Details Patient Name: Date of Service: [name], [place] [name] 03/05/2020 7:30 A M Medical Record Number: [id number] Patient Account Number: [id number] Date of Birth/Sex: Treating RN: 1976-03-29 (45 y.o. [name] Primary Care Kendra Woolford: [name], [name] Other Clinician: Referring Minda Faas: Treating Neyra Pettie/Extender: Carola Rhine, [name] in Treatment: 45 Clinic Level of Care Assessment Items TOOL 4 Quantity Score []  - 0 Use when only an EandM is performed on FOLLOW-UP visit ASSESSMENTS - Nursing  Assessment / Reassessment X- 1 10 Reassessment of Co-morbidities (includes updates in patient status) X- 1 5 Reassessment of Adherence to Treatment Plan ASSESSMENTS - Wound and Skin A ssessment / Reassessment []  - 0 Simple Wound Assessment / Reassessment - one wound []  - 0 Complex Wound Assessment / Reassessment - multiple wounds X- 1 10 Dermatologic / Skin Assessment (not related to wound area) ASSESSMENTS - Focused Assessment X- 1 5 Circumferential Edema Measurements - multi extremities []  - 0 Nutritional Assessment / Counseling / Intervention X- 1 5 Lower Extremity Assessment (monofilament, tuning fork, pulses) []  - 0 Peripheral Arterial Disease Assessment (using hand held doppler) ASSESSMENTS - Ostomy and/or Continence Assessment and Care []  - 0 Incontinence Assessment and Management []  - 0 Ostomy Care Assessment and Management (repouching, etc.) PROCESS - Coordination of Care X - Simple Patient / Family Education for ongoing care 1 15 []  - 0 Complex (extensive) Patient / Family Education for ongoing care X- 1 10 Staff obtains , Records, T Results / Process Orders est []  - 0 Staff telephones HHA, Nursing Homes / Clarify orders / etc []  - 0 Routine Transfer to another Facility (non-emergent condition) []  - 0 Routine Hospital Admission (non-emergent condition) []  - 0 New Admissions / / Ordering NPWT Apligraf, etc. , []  - 0 Emergency Hospital Admission (emergent condition) X- 1 10 Simple Discharge Coordination []  - 0 Complex (extensive) Discharge Coordination PROCESS - Special Needs []  - 0 Pediatric / Minor Patient Management []  - 0 Isolation Patient Management []  - 0 Hearing / Language / Visual special needs []  - 0 Assessment of Community assistance (transportation, D/C planning, etc.) []  - 0 Additional assistance / Altered mentation []  - 0 Support Surface(s) Assessment (bed, cushion, seat, etc.) INTERVENTIONS - Wound  Cleansing / Measurement []  - 0 Simple Wound Cleansing - one wound []  - 0 Complex Wound Cleansing - multiple wounds []  - 0 Wound Imaging (  photographs - any number of wounds) []  - 0 Wound Tracing (instead of photographs) []  - 0 Simple Wound Measurement - one wound []  - 0 Complex Wound Measurement - multiple wounds INTERVENTIONS - Wound Dressings []  - 0 Small Wound Dressing one or multiple wounds []  - 0 Medium Wound Dressing one or multiple wounds []  - 0 Large Wound Dressing one or multiple wounds []  - 0 Application of Medications - topical []  - 0 Application of Medications - injection INTERVENTIONS - Miscellaneous []  - 0 External ear exam []  - 0 Specimen Collection (cultures, biopsies, blood, body fluids, etc.) []  - 0 Specimen(s) / Culture(s) sent or taken to Lab for analysis []  - 0 Patient Transfer (multiple staff / / Similar devices) []  - 0 Simple Staple / Suture removal (25 or less) []  - 0 Complex Staple / Suture removal (26 or more) []  - 0 Hypo / Hyperglycemic Management (close monitor of Blood Glucose) []  - 0 Ankle / Brachial Index (ABI) - do not check if billed separately X- 1 5 Vital Signs Has the patient been seen at the hospital within the last three years: Yes Total Score: 75 Level Of Care: New/Established - Level 2 Electronic Signature(s) Signed: 03/05/2020 5:35:25 PM By: RN, BSN Entered By: on 03/05/2020 08:11:48 -------------------------------------------------------------------------------- Encounter Discharge Information Details Patient Name: Date of Service: , [name] 03/05/2020 7:30 A M Medical Record Number: Patient Account Number: Date of Birth/Sex: Treating RN: 1976-09-28 (44 y.o. Primary Care Canuto Kingston: Nurse, adult, [name] Other Clinician: Referring Amari Zagal: Treating Daylon Lafavor/Extender: , [name] in Treatment: 69 Encounter Discharge  Information Items Discharge Condition: Stable Ambulatory Status: Ambulatory Discharge Destination: Home Transportation: Private Auto Accompanied By: self Schedule Follow-up Appointment: Yes Clinical Summary of Care: Patient Declined Electronic Signature(s) Signed: 03/05/2020 5:35:25 PM By: RN, BSN Entered By: 05/05/2020 on 03/05/2020 08:13:32 -------------------------------------------------------------------------------- Lower Extremity Assessment Details Patient Name: Date of Service: [name], [name] 03/05/2020 7:30 A M Medical Record Number: 05/05/2020 Patient Account Number: [name] Date of Birth/Sex: Treating RN: 06/14/1976 (44 y.o. [id number] Primary Care Alyzabeth Pontillo: 07/10/1976, [name] Other Clinician: Referring Cem Kosman: Treating Zeniah Briney/Extender: 58, [name] in Treatment: 45 Edema Assessment Assessed: [Left: Yes] [Right: No] Edema: [Left: Ye] [Right: s] Calf Left: Right: Point of Measurement: 35 cm From Medial Instep 45 cm cm Ankle Left: Right: Point of Measurement: 11.5 cm From Medial Instep 25 cm cm Vascular Assessment Pulses: Dorsalis Pedis Palpable: [Left:Yes] Electronic Signature(s) Signed: 03/05/2020 4:59:21 PM By: [name] Signed: 03/05/2020 5:35:25 PM By: 53 RN, BSN Entered By: 05/05/2020 on 03/05/2020 07:52:23 -------------------------------------------------------------------------------- Multi-Disciplinary Care Plan Details Patient Name: Date of Service: [name], [name] 03/05/2020 7:30 A M Medical Record Number: 05/05/2020 Patient Account Number: [id number] Date of Birth/Sex: Treating RN: Mar 03, 1976 (45 y.o. 59 Primary Care Kaylub Detienne: [name], [name] Other Clinician: Referring Sundy Houchins: Treating Pete Schnitzer/Extender: [name], [name] in Treatment: 45 Active Inactive Electronic Signature(s) Signed: 03/05/2020 5:35:25 PM By: 05/05/2020 RN, BSN Entered  By: Shawn Stall on 03/05/2020 07:53:43 -------------------------------------------------------------------------------- Pain Assessment Details Patient Name: Date of Service: [name], [name] 03/05/2020 7:30 A M Medical Record Number: 05/05/2020 Patient Account Number: [name] Date of Birth/Sex: Treating RN: Dec 07, 1975 (45 y.o. [id number] Primary Care Emryn Flanery: 07/10/1976, [name] Other Clinician: Referring Makilah Dowda: Treating Rockne Dearinger/Extender: 58, [name] in Treatment: 45 Active Problems Location of Pain Severity and Description of Pain Patient Has  Paino No Site Locations Rate the pain. Current Pain Level: 0 Pain Management and Medication Current Pain Management: Medication: No Cold Application: No Rest: No Massage: No Activity: No T.E.N.S.: No Heat Application: No Leg drop or elevation: No Is the Current Pain Management Adequate: Adequate How does your wound impact your activities of daily livingo Sleep: No Bathing: No Appetite: No Relationship With Others: No Bladder Continence: No Emotions: No Bowel Continence: No Work: No Toileting: No Drive: No Dressing: No Hobbies: No Electronic Signature(s) Signed: 03/05/2020 4:59:21 PM By: Deon Pilling Signed: 03/05/2020 5:35:25 PM By: Baruch Gouty RN, BSN Entered By: Deon Pilling on 03/05/2020 07:51:44 -------------------------------------------------------------------------------- Patient/Caregiver Education Details Patient Name: Date of Service: Delma Freeze, [name] 5/7/2021andnbsp7:30 A M Medical Record Number: [id number] Patient Account Number: [id number] Date of Birth/Gender: Treating RN: [birth year] (44 y.o. [name] Primary Care Physician: [name], [place] Other Clinician: Referring Physician: Treating Physician/Extender: Otis Brace, [name] in Treatment: 46 Education Assessment Education Provided To: Patient Education Topics  Provided Venous: Methods: Explain/Verbal Responses: Reinforcements needed, State content correctly Wound/Skin Impairment: Methods: Explain/Verbal Responses: Reinforcements needed, State content correctly Electronic Signature(s) Signed: 03/05/2020 5:35:25 PM By: Baruch Gouty RN, BSN Entered By: Baruch Gouty on 03/05/2020 07:54:05 -------------------------------------------------------------------------------- Vitals Details Patient Name: Date of Service: Delma Freeze, [name] 03/05/2020 7:30 A M Medical Record Number: [id number] Patient Account Number: [id number] Date of Birth/Sex: Treating RN: February 20, 1976 (44 y.o. [name] Primary Care Geremiah Fussell: [name], [name] Other Clinician: Referring Marche Hottenstein: Treating Aislin Onofre/Extender: Otis Brace, [name] in Treatment: 45 Vital Signs Time Taken: 07:49 Temperature (F): 98.6 Height (in): 74 Pulse (bpm): 103 Weight (lbs): 325 Respiratory Rate (breaths/min): 20 Body Mass Index (BMI): 41.7 Blood Pressure (mmHg): 148/85 Reference Range: 80 - 120 mg / dl Electronic Signature(s) Signed: 03/05/2020 4:59:21 PM By: Deon Pilling Entered By: Deon Pilling on 03/05/2020 07:51:18

## 2020-03-10 NOTE — Progress Notes (Signed)
SUTTON, HIRSCH ([id number]) Visit Report for 02/20/2020 Arrival Information Details Patient Name: Date of Service: [place], [place] [name] 02/20/2020 8:15 A M Medical Record Number: [id number] Patient Account Number: [id number] Date of Birth/Sex: Treating RN: May 25, 1976 (44 y.o. [name] Primary Care Lamesha Tibbits: [name], [name] Other Clinician: Referring Bhavin Monjaraz: Treating Dajion Bickford/Extender: Otis Brace, [name] in Treatment: 70 Visit Information History Since Last Visit Added or deleted any medications: No Patient Arrived: Ambulatory Any new allergies or adverse reactions: No Arrival Time: 08:06 Had a fall or experienced change in No Accompanied By: self activities of daily living that may affect Transfer Assistance: None risk of falls: Patient Identification Verified: Yes Signs or symptoms of abuse/neglect since last visito No Secondary Verification Process Completed: Yes Hospitalized since last visit: No Patient Requires Transmission-Based Precautions: No Implantable device outside of the clinic excluding No Patient Has Alerts: No cellular tissue based products placed in the center since last visit: Has Dressing in Place as Prescribed: Yes Pain Present Now: No Electronic Signature(s) Signed: 03/10/2020 9:10:01 AM By: Sandre Kitty Entered By: Sandre Kitty on 02/20/2020 08:07:35 -------------------------------------------------------------------------------- Compression Therapy Details Patient Name: Date of Service: Delma Freeze, [name] 02/20/2020 8:15 A M Medical Record Number: [id number] Patient Account Number: [id number] Date of Birth/Sex: Treating RN: 09-Jan-1976 (44 y.o. Oval Linsey Primary Care Malia Corsi: [name], [name] Other Clinician: Referring Dashanae Longfield: Treating Jadie Allington/Extender: Otis Brace, [name] in Treatment: 43 Compression Therapy Performed for Wound Assessment: Wound #9RR Left,Anterior Lower Leg Performed By: [name] Church, RN Compression Type: Four Layer Electronic Signature(s) Signed: 02/24/2020 5:28:23 PM By: Carlene Coria RN Entered By: Carlene Coria on 02/20/2020 08:15:29 -------------------------------------------------------------------------------- Encounter Discharge Information Details Patient Name: Date of Service: Delma Freeze, [name] 02/20/2020 8:15 A M Medical Record Number: [id number] Patient Account Number: [id number] Date of Birth/Sex: Treating RN: 04/19/1976 (44 y.o. [name]) Carlene Coria Primary Care Nakyla Bracco: Other Clinician: [name], [name] Referring Malique Driskill: Treating [name] Summer/Extender: Otis Brace, [name] in Treatment: 36 Encounter Discharge Information Items Discharge Condition: Stable Ambulatory Status: Ambulatory Discharge Destination: Home Transportation: Private Auto Accompanied By: self Schedule Follow-up Appointment: Yes Clinical Summary of Care: Patient Declined Electronic Signature(s) Signed: 02/24/2020 5:28:23 PM By: Carlene Coria RN Entered By: Carlene Coria on 02/20/2020 08:27:04 -------------------------------------------------------------------------------- Patient/Caregiver Education Details Patient Name: Date of Service: Delma Freeze, [place] 4/23/2021andnbsp8:15 A M Medical Record Number: [id number] Patient Account Number: [id number] Date of Birth/Gender: Treating RN: [birth year] (44 y.o. Oval Linsey Primary Care Physician: [name], [name] Other Clinician: Referring Physician: Treating Physician/Extender: Otis Brace, [name] in Treatment: 48 Education Assessment Education Provided To: Patient Education Topics Provided Wound/Skin Impairment: Methods: Explain/Verbal Responses: State content correctly Electronic Signature(s) Signed: 02/24/2020 5:28:23 PM By: Carlene Coria RN Entered By: Carlene Coria on 02/20/2020 08:16:54 -------------------------------------------------------------------------------- Wound Assessment  Details Patient Name: Date of Service: Delma Freeze, [name] 02/20/2020 8:15 A M Medical Record Number: [id number] Patient Account Number: [id number] Date of Birth/Sex: Treating RN: 1976-03-27 (44 y.o. [name] Primary Care Merilyn Pagan: [name], [name] Other Clinician: Referring Xandrea Clarey: Treating Akiko Schexnider/Extender: Otis Brace, [name] in Treatment: 60 Wound Status Wound Number: [id number] Primary Etiology: Venous Leg Ulcer Wound Location: Left, Anterior Lower Leg Wound Status: Open Wounding Event: Gradually Appeared Date Acquired: 03/31/2019 Weeks Of Treatment: 43 Clustered Wound: No Wound Measurements Length: (cm) 2.5 Width: (cm) 0.8 Depth: (cm) 0.1 Area: (cm) 1.571 Volume: (cm) 0.157 % Reduction in Area: 88.1% % Reduction in Volume: 88.1% Wound Description Classification: Full Thickness Without Exposed Support Structur es Electronic Signature(s)  Signed: 02/20/2020 5:13:47 PM By: [name] Signed: 03/10/2020 9:10:01 AM By: Karl Ito Entered By: Karl Ito on 02/20/2020 08:08:00 -------------------------------------------------------------------------------- Vitals Details Patient Name: Date of Service: [name], [name] 02/20/2020 8:15 A M Medical Record Number: [id number] Patient Account Number: [id number] Date of Birth/Sex: Treating RN: 08-05-1976 (44 y.o. [name] Primary Care Irbin Fines: [name], [name] Other Clinician: Referring Halea Lieb: Treating Bayard More/Extender: Carola Rhine, [name] in Treatment: 57 Vital Signs Time Taken: 08:07 Temperature (F): 98.3 Height (in): 74 Pulse (bpm): 98 Weight (lbs): 325 Respiratory Rate (breaths/min): 18 Body Mass Index (BMI): 41.7 Blood Pressure (mmHg): 153/75 Reference Range: 80 - 120 mg / dl Electronic Signature(s) Signed: 03/10/2020 9:10:01 AM By: Karl Ito Entered By: Karl Ito on 02/20/2020 08:07:51

## 2020-04-20 ENCOUNTER — Other Ambulatory Visit: Payer: Self-pay

## 2020-04-20 ENCOUNTER — Ambulatory Visit (INDEPENDENT_AMBULATORY_CARE_PROVIDER_SITE_OTHER): Payer: Medicare HMO | Admitting: Orthopaedic Surgery

## 2020-04-20 ENCOUNTER — Ambulatory Visit (INDEPENDENT_AMBULATORY_CARE_PROVIDER_SITE_OTHER): Payer: Medicare HMO

## 2020-04-20 ENCOUNTER — Encounter: Payer: Self-pay | Admitting: Orthopaedic Surgery

## 2020-04-20 VITALS — Ht 73.5 in | Wt 335.6 lb

## 2020-04-20 DIAGNOSIS — M25551 Pain in right hip: Secondary | ICD-10-CM

## 2020-04-20 DIAGNOSIS — M25552 Pain in left hip: Secondary | ICD-10-CM

## 2020-04-20 DIAGNOSIS — M1611 Unilateral primary osteoarthritis, right hip: Secondary | ICD-10-CM | POA: Diagnosis not present

## 2020-04-20 DIAGNOSIS — M1612 Unilateral primary osteoarthritis, left hip: Secondary | ICD-10-CM | POA: Diagnosis not present
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
## 2020-05-28 ENCOUNTER — Encounter (HOSPITAL_BASED_OUTPATIENT_CLINIC_OR_DEPARTMENT_OTHER): Payer: Medicare HMO | Attending: Internal Medicine | Admitting: Physician Assistant

## 2020-05-28 DIAGNOSIS — G47419 Narcolepsy without cataplexy: Secondary | ICD-10-CM | POA: Diagnosis not present

## 2020-05-28 DIAGNOSIS — Z8249 Family history of ischemic heart disease and other diseases of the circulatory system: Secondary | ICD-10-CM | POA: Diagnosis not present

## 2020-05-28 DIAGNOSIS — I872 Venous insufficiency (chronic) (peripheral): Secondary | ICD-10-CM | POA: Insufficient documentation

## 2020-05-28 DIAGNOSIS — I10 Essential (primary) hypertension: Secondary | ICD-10-CM | POA: Diagnosis not present

## 2020-05-28 DIAGNOSIS — J45909 Unspecified asthma, uncomplicated: Secondary | ICD-10-CM | POA: Insufficient documentation

## 2020-05-28 DIAGNOSIS — L03116 Cellulitis of left lower limb: Secondary | ICD-10-CM | POA: Insufficient documentation

## 2020-05-28 DIAGNOSIS — I890 Lymphedema, not elsewhere classified: Secondary | ICD-10-CM | POA: Insufficient documentation

## 2020-05-28 DIAGNOSIS — Z6841 Body Mass Index (BMI) 40.0 and over, adult: Secondary | ICD-10-CM | POA: Insufficient documentation

## 2020-05-28 DIAGNOSIS — Z95820 Peripheral vascular angioplasty status with implants and grafts: Secondary | ICD-10-CM | POA: Insufficient documentation

## 2020-05-28 DIAGNOSIS — G4730 Sleep apnea, unspecified: Secondary | ICD-10-CM | POA: Diagnosis not present

## 2020-05-28 DIAGNOSIS — Z7901 Long term (current) use of anticoagulants: Secondary | ICD-10-CM | POA: Diagnosis not present

## 2020-05-28 DIAGNOSIS — G629 Polyneuropathy, unspecified: Secondary | ICD-10-CM | POA: Diagnosis not present

## 2020-05-28 DIAGNOSIS — L97822 Non-pressure chronic ulcer of other part of left lower leg with fat layer exposed: Secondary | ICD-10-CM | POA: Insufficient documentation

## 2020-05-28 DIAGNOSIS — Z86718 Personal history of other venous thrombosis and embolism: Secondary | ICD-10-CM | POA: Insufficient documentation

## 2020-05-28 DIAGNOSIS — M1990 Unspecified osteoarthritis, unspecified site: Secondary | ICD-10-CM | POA: Insufficient documentation

## 2020-05-28 DIAGNOSIS — D6869 Other thrombophilia: Secondary | ICD-10-CM | POA: Diagnosis not present

## 2020-05-28 DIAGNOSIS — Z833 Family history of diabetes mellitus: Secondary | ICD-10-CM | POA: Diagnosis not present

## 2020-05-28 DIAGNOSIS — Z86711 Personal history of pulmonary embolism: Secondary | ICD-10-CM | POA: Insufficient documentation

## 2020-05-28 DIAGNOSIS — I809 Phlebitis and thrombophlebitis of unspecified site: Secondary | ICD-10-CM | POA: Diagnosis not present

## 2020-05-28 DIAGNOSIS — E669 Obesity, unspecified: Secondary | ICD-10-CM | POA: Diagnosis not present

## 2020-05-28 NOTE — Progress Notes (Signed)
[name], [name] ([id number]) Visit Report for 05/28/2020 Allergy List Details Patient Name: Date of Service: [name] 05/28/2020 9:00 A M Medical Record Number: [id number] Patient Account Number: [id number] Date of Birth/Sex: Treating [name]: 1976/02/20 (44 y.o. [name]) [name] Primary Care [name]: [name], [name] Other Clinician: Referring [name]: Treating [name]/Extender: [name] Weeks in Treatment: [name] Allergies Active Allergies No Known Drug Allergies Allergy Notes Electronic Signature(s) Signed: 05/28/2020 4:58:30 PM By: [name] Entered By: [name] on 05/28/2020 09:19:51 -------------------------------------------------------------------------------- Arrival Information Details Patient Name: Date of Service: [name] Hough, MA [name] 05/28/2020 9:00 A M Medical Record Number: [id number] Patient Account Number: [id number] Date of Birth/Sex: Treating [name]: 1975/11/06 (44 y.o. [name]) [name] Primary Care [name]: [name], [name] Other Clinician: Referring [name]: Treating [name]/Extender: [name] Weeks in Treatment: [name] Visit Information Patient Arrived: Ambulatory Arrival Time: 09:17 Accompanied By: self Transfer Assistance: None Patient Identification Verified: Yes Secondary Verification Process Completed: Yes Patient Has Alerts: Yes Patient Alerts: Patient on Blood Thinner History Since Last Visit All ordered tests and consults were completed: No Added or deleted any medications: No Any new allergies or adverse reactions: No Had a fall or experienced change in activities of daily living that may affect risk of falls: No Signs or symptoms of abuse/neglect since last visito No Hospitalized since last visit: No Implantable device outside of the clinic excluding cellular tissue based products placed in the center since last visit: No Pain Present Now: No Electronic Signature(s) Signed: 05/28/2020 4:58:30 PM By: [name] Entered By: [name] on 05/28/2020 09:18:32 -------------------------------------------------------------------------------- Clinic Level of Care Assessment Details Patient Name: Date of Service: [name] 05/28/2020 9:00 A M Medical Record Number: [id number] Patient Account Number: [id number] Date of Birth/Sex: Treating [name]: 08/28/1976 (44 y.o. [name] Primary Care Rieley Khalsa: [name], [name] Other Clinician: Referring Kishia Shackett: Treating Jozeph Persing/Extender: [name] Weeks in Treatment: [name] Clinic Level of Care Assessment Items TOOL 1 Quantity Score X- 1 [name] Use when EandM and Procedure is performed on INITIAL visit ASSESSMENTS - Nursing Assessment / Reassessment X- 1 20 General Physical Exam (combine w/ comprehensive assessment (listed just below) when performed on new pt. evals) X- 1 25 Comprehensive Assessment (HX, ROS, Risk Assessments, Wounds Hx, etc.) ASSESSMENTS - Wound and Skin Assessment / Reassessment []  - [name] Dermatologic / Skin Assessment (not related to wound area) ASSESSMENTS - Ostomy and/or Continence Assessment and Care []  - [name] Incontinence Assessment and Management []  - [name] Ostomy Care Assessment and Management (repouching, etc.) PROCESS - Coordination of Care X - Simple Patient / Family Education for ongoing care 1 15 []  - [name] Complex (extensive) Patient / Family Education for ongoing care X- 1 10 Staff obtains , Records, T Results / Process Orders est []  - [name] Staff telephones HHA, Nursing Homes / Clarify orders / etc []  - [name] Routine Transfer to another Facility (non-emergent condition) []  - [name] Routine Hospital Admission (non-emergent condition) X- 1 15 New Admissions / / Ordering NPWT Apligraf, etc. , []  - [name] Emergency Hospital Admission (emergent condition) PROCESS - Special Needs []  - [name] Pediatric / Minor Patient Management []  - [name] Isolation Patient Management []  - [name] Hearing /  Language / Visual special needs []  - [name] Assessment of Community assistance (transportation, D/C planning, etc.) []  - [name] Additional assistance / Altered mentation []  - [name] Support Surface(s) Assessment (bed, cushion, seat, etc.) INTERVENTIONS - Miscellaneous []  - [name] External ear  exam []  - [name] Patient Transfer (multiple staff / / Similar devices) []  - [name] Simple Staple / Suture removal (25 or less) []  - [name] Complex Staple / Suture removal (26 or more) []  - [name] Hypo/Hyperglycemic Management (do not check if billed separately) X- 1 15 Ankle / Brachial Index (ABI) - do not check if billed separately Has the patient been seen at the hospital within the last three years: Yes Total Score: 100 Level Of Care: New/Established - Level 3 Electronic Signature(s) Signed: 05/28/2020 5:02:39 PM By: Signed: 05/28/2020 5:02:39 PM By: Entered By: 05/30/2020 on 05/28/2020 10:07:06 -------------------------------------------------------------------------------- Compression Therapy Details Patient Name: Date of Service: 05/30/2020, MA [name] 05/28/2020 9:00 A M Medical Record Number: [name] Patient Account Number: 05/30/2020 Date of Birth/Sex: Treating [name]: 09/22/1976 (44 y.o. 05/30/2020 Primary Care [name]: [id number], [name] Other Clinician: Referring [name]: Treating [name]/Extender: [id number], [name] Weeks in Treatment: [name] Compression Therapy Performed for Wound Assessment: Wound #18 Left,Anterior Lower Leg Performed By: Clinician 07/10/1976, [name] Compression Type: Four Layer Post Procedure Diagnosis Same as Pre-procedure Electronic Signature(s) Signed: 05/28/2020 5:02:39 PM By: [name] Entered By: [name] on 05/28/2020 09:57:30 -------------------------------------------------------------------------------- Encounter Discharge Information Details Patient Name: Date of Service: 05/30/2020, MA [name] 05/28/2020  9:00 A M Medical Record Number: [name] Patient Account Number: 05/30/2020 Date of Birth/Sex: Treating [name]: Aug 28, 1976 (44 y.o. [id number] Primary Care [name]: [id number], [name] Other Clinician: Referring Beverley Sherrard: Treating Zaelynn Fuchs/Extender: 07/10/1976, [name] Weeks in Treatment: [name] Encounter Discharge Information Items Post Procedure Vitals Discharge Condition: Stable Temperature (F): 98.5 Ambulatory Status: Ambulatory Pulse (bpm): 88 Discharge Destination: Home Respiratory Rate (breaths/min): 18 Transportation: Private Auto Blood Pressure (mmHg): 148/93 Accompanied By: self Schedule Follow-up Appointment: Yes Clinical Summary of Care: Patient Declined Electronic Signature(s) Signed: 05/28/2020 4:58:30 PM By: [name] [name] Entered By: [name] on 05/28/2020 10:29:25 -------------------------------------------------------------------------------- Lower Extremity Assessment Details Patient Name: Date of Service: 05/30/2020, MA [name] 05/28/2020 9:00 A M Medical Record Number: [name] Patient Account Number: 05/30/2020 Date of Birth/Sex: Treating [name]: 07-05-1976 (44 y.o. [id number] Primary Care Aedyn Mckeon: [id number], [name] Other Clinician: Referring Jams Trickett: Treating Dylan Ruotolo/Extender: 07/10/1976, [name] Weeks in Treatment: [name] Edema Assessment Assessed: [Left: No] [[name]: No] E[Left: dema] [[name]: :] Calf Left: [name]: Point of Measurement: 45 cm From Medial Instep 45 cm cm Ankle Left: [name]: Point of Measurement: 10 cm From Medial Instep 25 cm cm Vascular Assessment Blood Pressure: Brachial: [Left:148] Ankle: [Left:Dorsalis Pedis: 160 1.08] Electronic Signature(s) Signed: 05/28/2020 4:58:30 PM By: [name] [name] Entered By: [name] on 05/28/2020 09:36:13 -------------------------------------------------------------------------------- Multi-Disciplinary Care Plan Details Patient Name: Date of Service: 05/30/2020, MA [name] 05/28/2020  9:00 A M Medical Record Number: [name] Patient Account Number: 05/30/2020 Date of Birth/Sex: Treating [name]: Oct 24, 1976 (44 y.o. 05/30/2020 Primary Care Ottis Vacha: [id number], [name] Other Clinician: Referring Nahia Nissan: Treating Ahron Hulbert/Extender: [id number], [name] Weeks in Treatment: [name] Active Inactive Venous Leg Ulcer Nursing Diagnoses: Actual venous Insuffiency (use after diagnosis is confirmed) Goals: Patient will maintain optimal edema control Date Initiated: 05/28/2020 Target Resolution Date: 06/25/2020 Goal Status: Active Interventions: Compression as ordered Notes: Wound/Skin Impairment Nursing Diagnoses: Impaired tissue integrity Goals: Ulcer/skin breakdown will have a volume reduction of 30% by week 4 Date Initiated: 05/28/2020 Target Resolution Date: 06/25/2020 Goal Status: Active Interventions: Provide education on ulcer and skin care Notes: Electronic Signature(s) Signed: 05/28/2020 5:02:39 PM By: 06/27/2020 Entered By: 05/30/2020  on 05/28/2020 09:47:02 -------------------------------------------------------------------------------- Pain Assessment Details Patient Name: Date of Service: [name] [name] 05/28/2020 9:00 A M Medical Record Number: [id number] Patient Account Number: [id number] Date of Birth/Sex: Treating [name]: [birth year] (44 y.o. [name] PetitM) [name] PaxEpps, Carrie Primary Care Patty Lopezgarcia: [name] ChuckKoirala, [name] Other Clinician: Referring Kearah Gayden: Treating Damarko Stitely/Extender: [name] PriestStone III, Hoyt Koirala, [name] Weeks in Treatment: [name] Active Problems Location of Pain Severity and Description of Pain Patient Has Paino No Site Locations Pain Management and Medication Current Pain Management: Electronic Signature(s) Signed: 05/28/2020 4:58:30 PM By: [name] PaxEpps, Carrie [name] Entered By: [name] PaxEpps, Carrie on 05/28/2020 09:44:20 -------------------------------------------------------------------------------- Patient/Caregiver Education Details Patient Name: Date of  Service: [name] HoughBO BE, MA [name] 7/30/2021andnbsp9:00 A M Medical Record Number: [id number] Patient Account Number: [id number] Date of Birth/Gender: Treating [name]: [birth year] (44 y.o. [name] RightM) Dwiggins, Shannon Primary Care Physician: [name] ChuckKoirala, [name] Other Clinician: Referring Physician: Treating Physician/Extender: [name] PriestStone III, Hoyt Koirala, [name] Weeks in Treatment: [name] Education Assessment Education Provided To: Patient Education Topics Provided Wound/Skin Impairment: Handouts: Caring for Your Ulcer Methods: Explain/Verbal Responses: State content correctly Electronic Signature(s) Signed: 05/28/2020 5:02:39 PM By: [name] Mylarwiggins, Shannon Entered By: [name] Mylarwiggins, Shannon on 05/28/2020 09:48:06 -------------------------------------------------------------------------------- Wound Assessment Details Patient Name: Date of Service: [name] HoughBO BE, MA [name] 05/28/2020 9:00 A M Medical Record Number: [id number] Patient Account Number: [id number] Date of Birth/Sex: Treating [name]: [birth year] (44 y.o. [name] PetitM) [name] PaxEpps, Carrie Primary Care Tiras Bianchini: [name] ChuckKoirala, [name] Other Clinician: Referring Vyolet Sakuma: Treating Dajanee Voorheis/Extender: [name] PriestStone III, Hoyt Koirala, [name] Weeks in Treatment: [name] Wound Status Wound Number: 18 Primary Inflammatory Etiology: Wound Location: Left, Anterior Lower Leg Wound Open Wounding Event: Gradually Appeared Status: Date Acquired: 05/12/2020 Comorbid Asthma, Sleep Apnea, Deep Vein Thrombosis, Phlebitis, Weeks Of Treatment: [name] History: Osteoarthritis, Neuropathy Clustered Wound: No Wound Measurements Length: (cm) 6.6 Width: (cm) 3.5 Depth: (cm) [name].1 Area: (cm) 18.143 Volume: (cm) 1.814 % Reduction in Area: % Reduction in Volume: Epithelialization: None Tunneling: No Undermining: No Wound Description Classification: Full Thickness Without Exposed Support Structures Exudate Amount: Medium Exudate Type: Serosanguineous Exudate Color: red, brown Foul Odor After Cleansing: No Slough/Fibrino Yes Wound  Bed Granulation Amount: Medium (34-66%) Exposed Structure Granulation Quality: Red Fascia Exposed: No Necrotic Amount: Medium (34-66%) Fat Layer (Subcutaneous Tissue) Exposed: Yes Necrotic Quality: Adherent Slough Tendon Exposed: No Muscle Exposed: No Joint Exposed: No Bone Exposed: No Electronic Signature(s) Signed: 05/28/2020 4:58:30 PM By: [name] PaxEpps, Carrie [name] Entered By: [name] PaxEpps, Carrie on 05/28/2020 09:43:27 -------------------------------------------------------------------------------- Wound Assessment Details Patient Name: Date of Service: [name] HoughBO BE, MA [name] 05/28/2020 9:00 A M Medical Record Number: [id number] Patient Account Number: [id number] Date of Birth/Sex: Treating [name]: [birth year] (44 y.o. [name] RightM) Dwiggins, Shannon Primary Care Micheale Schlack: [name] ChuckKoirala, [name] Other Clinician: Referring Lincoln Kleiner: Treating Stevey Stapleton/Extender: [name] PriestStone III, Hoyt Koirala, [name] Weeks in Treatment: [name] Wound Status Wound Number: 19 Primary Lymphedema Etiology: Wound Location: Left, Medial Lower Leg Wound Open Wounding Event: Gradually Appeared Status: Date Acquired: 05/12/2020 Comorbid Asthma, Sleep Apnea, Deep Vein Thrombosis, Phlebitis, Weeks Of Treatment: [name] History: Osteoarthritis, Neuropathy Clustered Wound: No Wound Measurements Length: (cm) [name] Width: (cm) [name].5 Depth: (cm) [name].2 Area: (cm) [name].039 Volume: (cm) [name].008 % Reduction in Area: % Reduction in Volume: Epithelialization: None Tunneling: No Undermining: No Wound Description Classification: Full Thickness Without Exposed Support Structures Wound Margin: Distinct, outline attached Exudate Amount: Medium Exudate Type: Serosanguineous Exudate Color: red, brown Foul Odor After Cleansing: No Slough/Fibrino No Wound Bed Granulation Amount: Large (67-100%) Exposed Structure Granulation Quality: Red, Pink Fascia Exposed: No Necrotic Amount: None Present ([name]%) Fat Layer (Subcutaneous Tissue) Exposed: Yes Tendon Exposed: No Muscle Exposed:  No Joint  Exposed: No Bone Exposed: No Treatment Notes Wound #19 (Left, Medial Lower Leg) 1. Cleanse With Wound Cleanser 2. Periwound Care Moisturizing lotion 3. Primary Dressing Applied Hydrofera Blue 4. Secondary Dressing ABD Pad Dry Gauze 6. Support Layer Applied 4 layer compression Cytogeneticist) Signed: 05/28/2020 5:02:39 PM By: [name] Entered By: [name] on 05/28/2020 10:03:17 -------------------------------------------------------------------------------- Vitals Details Patient Name: Date of Service: [name] Hough, MA [name] 05/28/2020 9:00 A M Medical Record Number: [id number] Patient Account Number: [id number] Date of Birth/Sex: Treating [name]: [birth year] (44 y.o. [name]) [name] Primary Care Scotty Weigelt: [name], [name] Other Clinician: Referring Gaelle Adriance: Treating Cutberto Winfree/Extender: [name] Weeks in Treatment: [name] Vital Signs Time Taken: 09:18 Temperature (F): 98.5 Height (in): 74 Pulse (bpm): 88 Source: Stated Respiratory Rate (breaths/min): 18 Weight (lbs): 335 Blood Pressure (mmHg): 148/93 Source: Stated Reference Range: 80 - 120 mg / dl Body Mass Index (BMI): 43 Electronic Signature(s) Signed: 05/28/2020 4:58:30 PM By: [name] Entered By: [name] on 05/28/2020 09:19:46

## 2020-05-28 NOTE — Progress Notes (Signed)
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 4-layer compression. He tells me that his IVC filter is permanent and cannot [name] removed he is already been reviewed for this. The [name] iliac vein stent is already 50% occluded. We are using 4 layer compression and he seems to [name] doing better 1/14; the area on the anterior lateral leg is effectively closed. His major area on the anterior tibia still is open with a mild amount of depth old measurements are better. He has a new area just lateral to the tibia and more superiorly the other wounds. He states the wrap was too tight in the area and he developed a blister. His wife is in today to learn how to do 4 layer compression and will see him back in 2 weeks 1/28; he only has 1 open area on the left anterior tibial area. This is come in somewhat. Still 2 to 3 mm in depth does not require debridement we have  been using silver collagen his wife is changing the dressings. He points out on the medial upper calf a small tender area that is developed when he took his wrap off last night to have a shower this has some dark discoloration. It is tender. There is a palpable raised area in the middle. I suspect this is an area of folliculitis however the amount of tenderness around this is somewhat more in diameter than I am used to seeing with this type of presentation. I am concerned enough to consider empiric oral antibiotics, there is nothing to culture here. The patient is on Xarelto he has  no allergies 2/11; the patient completed a week's worth of antibiotics last time for folliculitis area on the left medial upper calf. This is expanded into a wound. More concerning than this he has eschar around his original wound on the left anterior tibia and several eschared areas around it which are small individually. He probably does not have great edema control. He asked to come back weekly to [name] rewrapped, he is concerned that his wife is not able to do this consistently in terms of the amount of compression. I agreed. Continuing with silver collagen to the wounds 2/18; patient tells me he was in the ER at [facility] last weekend. He has been experiencing increasing pain in his [name] upper thigh/groin area mostly when he changes position. He apparently had a duplex ultrasound that was negative for clot but is scheduled for a CT scan to look at the upper vasculature. His wound areas on the left leg which are the ones we have been dealing with are a lot better. We have been using silver collagen 2/25; the patient has a small area on the medial left tibia which is just about closed and a smaller area on the medial left calf. We have been using silver collagen 3/3; the area on the left anterior tibia is closed and a smaller area on the medial left calf superiorly is just about fully epithelialized. We have been  using silver collagen 02/03/2019 Readmission The patient called after his appointment a month ago to say that he was healed over. He went back into his 30/40 below-knee compression stockings. He states about a week or 2 after this he developed an open area in the same part of the left anterior tibial area. His stockings are about a year old. He feels they may have punched around the area that broke down. He was not putting anything over this but nor had we really instructed him to. We use silver collagen to close this out the last time under 4-layer compression. He is definitely going to need new stockings 4/13; general things look better this is on his left anterior tibia. We have been using silver collagen 4/20; using silver collagen. Dimensions are smaller. Left anterior tibia 4/27; using silver collagen. Dimensions continue to [name] smaller on the left anterior tibia [facility] tells me that last week there was a small scab on the medial part of the left foot. None of [place] recorded this. He states that over the course of the week he developed increasing pain in this area. He took the compression off last night expecting to see a large open wound however a small amount of tissue came out of this area to reveal a small wound which otherwise looks somewhat benign yet he is complaining of extreme pain in this area. 5/4; using silver collagen to the major area on the left anterior tibia that continues to get smaller [facility] is developed a additional wound on the left medial foot. Undoubtedly this was probably a break in his skin that became secondarily infected. Very painful I gave him doxycycline last week he is still saying that this is painful but not quite as bad as last time 5/11; using silver collagen to the major area in the left anterior tibia ooThe area on the left medial foot culture grew methicillin sensitive staph aureus I gave him 10 days in total of doxycycline which should have covered  this. 5/18-.Returns at 1 week, has been in 4 layer compression on the left,  using alginate for the left leg and Prisma on the left foot READMISSION 6/25 He returns to clinic today with a 3 week history of an opening on the left anterior mid tibia area. He has been applying silver collagen to this. When he left our clinic we ordered him 30-40 over the toe stockings. He states his edema is under as good control in this leg as he is ever seen it. Over the last 2 days he has developed an area on the left medial ankle/foot. This is the area that we worked on last time they cultured staph I gave him antibiotics and this closed over. The patient has a history of pulmonary embolism with an IVC filter. 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 He is on chronic anticoagulation. 7/2; the area on the left mid tibia did not have a viable surface today somewhat surprising adherent black necrotic surface. Not even really eschared. No evidence of infection. We did silver alginate last week 7/9; left mid tibia somewhat improved surface and slightly smaller. Using Iodoflex. 7/16 left mid tibia. Continues to [name] slightly smaller with improved surface using Iodoflex. He did not see Dr. Trula Ore with regards to the CT scan on the [name] leg because his insurance did not approve it 7/23; left mid tibia. No change in surface area however the wound surface looks better. He has another small area superiorly which is a small hyper granulated lesion. But this is of unclear etiology however it is defined is new this week. He also had significant bleeding reported by our intake nurse the exact source of this was unclear. 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 His original wound looks quite a bit better and how it every he is developed over the last 3 weeks a raised painful area just above the wound.  I am not sure if this represents a primary cutaneous issue or a venous issue. He seems to have a palpable vein underneath this which is tender may represent superficial venous thrombosis. He is already on Xarelto. 8/6-Patient returns at 1 week for his left mid tibial wounds, the more proximal of the 2 wounds was raised painful area described last time, patient states this is less painful than before, he is almost completed his doxycycline, the wound area is larger but the wound itself is less painful. 8/13-Left anterior leg wound looks smaller and overall making good progress the other wound has healed. We are using Hydrofera Blue 8/20; the patient has 2 open areas. The original inferior area and the new area superiorly. He has been using Hydrofera Blue ready but the wounds overall look dry. He has not managed to get a CT scan approved and hence has not seen Dr. Trula Ore at [facility] 8/27 most of the patient's wound areas on the anterior look epithelialized. There is still some eschar and vulnerability here. He has been wearing to press stockings which should give him 30/40 mmHg compression. He also has compression pumps that belonged to his father. I think it would [name] reasonable to try and get him external compression pumps. He was not using the compression pumps daily and these certainly need to [name] used daily. Finally he was approved for his CT scan on the [name] with follow-up with Dr. Trula Ore. We will asked Dr. Trula Ore to look at his left leg as well 9/3; the patient had 2  wound areas. Central tibial area of area is healed. Smaller wound superiorly still perhaps slightly open. I believe his CT scan on the [name] hip and pelvis areas on the 18th. That was ordered by Dr. Trula Ore. We have ordered him 30/40 stockings and are going to attempt to get him compression pumps 9/10-Patient returns at 1 week for the 2 small areas on his left anterior leg, these areas are healed. Patient is now going to going to the  30/40 stockings. His CT scan of his [name] leg hip and pelvis areas is scheduled 9/22 the patient was discharged from the clinic on 9/10. He tells me that he started developing pain on the left anterior tibial area and swelling on Thursday of last week. By Saturday the swelling was so prominent that he was scared to take his stocking off because he would [name] able to get it back on. There was increased pain. He was seen in the emergency department at [facility] on 9/1. 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 He was also felt to have cellulitis. There was apparently not any acute clot. He I he was put on a 3 time a day medication/antibiotic which I am assuming is clindamycin. He has 2 reopened areas both on the anterior tibia 9/29; he completed the clindamycin even though it caused diarrhea. The diarrhea is resolved. The cellulitis in the left leg that was prominent last week has resolved. He also had a CT scan of the pelvis done which I think did not show anything on the [name] but did show obstruction of theo Common iliac vein on the left [he says in close proximity to the IVC]. I looked in [place] link I do not see the actual CT scan report. He is being apparently scheduled for an attempt at stenting retrograde and then if that does not work anterior grade by Dr. Trula Ore. We use silver alginate to the wound last week because of coexistent cellulitis 10/6; cellulitis on the left leg resolved. He is still waiting for insurance authorization for his pelvic vein stenting. Changed him to [facility] last week 10/13; cellulitis remains resolved. Wound is smaller. Using Hydrofera Blue under compression. In 2 days time he is going for his venous stenting hopefully by Dr. Trula Ore at [facility] 10/20; the patient went for his procedure last Thursday by Dr. Trula Ore  although he does not remember in the postop period what he was told. He is not certain whether Dr. Trula Ore managed to get the stent then successfully. His wound is measuring smaller looks healthy on the left mid tibia. He is also having some discomfort behind his left knee that he had me look at which is the site where Dr. Trula Ore had venous access. I looked in care everywhere. I could not see a specific note from Dr. Trula Ore [[facility]] above the actual procedure. Presumably it is still in transfer therefore I could not really answer his question about whether the stent was placed successful 10/27; wound not quite as good as last week. Using Hydrofera Blue. He thinks it might of stuck to the wound and was adherent when they took it off today. His procedure with Dr. Trula Ore was not successful. He thinks that Dr. Trula Ore will try to go at this from the other direction at some point. He sees him in 2- 1/2-week 11/3; quite an improvement in wound surface area  this week. We are using Hydrofera Blue. No need to change the dressing. Follows up with Dr. Trula Ore in about a week and a half 11/10; wound surface not as good this week at all. No changes in dimensions. We have been using Hydrofera Blue. As well he had tells [place] he had discomfort in his foot all week although he admits he was up on this more than usual. He comes in today with 2 small punched out areas on the left lateral foot. He has severe venous hypertension. He sees Dr. Trula Ore again on 11/16 11/17; not much change in the area on the anterior tibia mid aspect. He also has 2 areas on the left lateral foot. We have been using Sorbact on the left anterior and Iodosorb ointment on the left foot. He is under 4-layer compression. The patient went to see Dr. Trula Ore I do not yet have access to this note and care everywhere. However according to the patient there is an option to try and address his venous occlusion I think via a transjugular approach. This is  complicated by the fact that the IVC filter is there and would have to [name] traversed. According to the patient there is about a 33% chance of perforation may [name] even aortic perforation. However they would [name] prepared for this. He is thinking about this and discussing it with his wife Thanks to our case manager this week we are able to determine if for some reason the patient is not using his compression pumps. He also is not able to get stockings on the [name] leg which are 30/40 below-knee. I think we may need to order external compression garments 12/1; patient arrives with the area on the left anterior mid tibia expanded superiorly small rim of skin between the original area and the new area. He has painful small denuded areas on the left medial and left lateral heel. We have been using Iodoflex. His next appointment with the Dr. Trula Ore is January 4. after the holidays to discuss if he would like to pursue this endovascular option. Dr. Sherrin Daisy notes below from last visit Subjective Kaydenn Mclear is a 45 y.o. 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 Unfortunately during the procedure a stent was not able to [name] placed d/t narrowing at the IVC. Per the patient the procedure provided 2-3 days of symptomatic relief (decrease in L LE pressure and pain) but after that his symptoms returned with increased pressure, pain in the hips and pain in the legs when standing. The patient does report his L LE wound is stable especially when he is consistently attending his wound care clinic in [place]. The patient denies using compressive LE therapies unless done at his wound clinic due to not being able to reach his legs. He is interested in learning more and possibly pursing further  interventional options for his L lower extremity via access through neck. *Objective Physical Exam: BP 171/86   Pulse 80   T emp 37 C (T emporal)   Wt (!) 147.4 kg (325 lb)   BMI 41.73 kg/m General: awake, alert and oriented x 3 Chest: Non labored breathing on room air CVS: Normal rate. Regular rhythm. ABD: soft, NT ND, Ext: bilateral leg edema w/ left leg in compressive dressing from  wound clinic. Evidence of skin discoloration on both feet bilaterally. Data Review: Labs: None to review Imaging:Reviewed report of 10/15 L LE venogram. I saw and evaluated the patient, participating in the key portions of the service. I reviewed the residentoos note. I agree with the residentoos findings and plan. Electronically signed by Derry Skill, MD at 09/16/2019 10:53 AM EST 12/11 the areas around his ankles have healed. 2 areas in the center part of the mid tibia area are still open we have been using silver collagen. Procedure attempt by Dr. Trula Ore at [facility] on 11/03/2019 12/18; the wounds around his ankles remain closed. I think there has been some improvement in the 2 areas in the center part of the mid tibia. Especially superiorly. Debris on the center required debridement we have been using silver collagen 12/31; I saw the patient briefly last week when he was here for a nurse visit. He had irritation in which look believed to [name] localized cellulitis and folliculitis on the lateral left calf. I gave him a prescription for cephalexin although he admits today he never filled it. The area still looked very angry. His original wounds we have been treating where a figure-of-eight shaped area on the anterior tibial area mid aspect. We have been using silver collagen these do not look healthy. 11/07/2019. The patient has completed his antibiotics. The area of folliculitis on the left lateral calf looks somewhat better although there is still erythema there is no tenderness. I am going to put some  more compression on this area and will have a look at this next week. No additional antibiotics. Since the patient was last here he went to see Dr. Trula Ore at [facility]. I am able to see his note from 11/03/2019 in care everywhere. The patient has a history of a [name] iliac vein stent and IVC filter placement. He is status post angioplasty of the left common iliac vein on 08/14/2019 for chronic obstruction and venous insufficiency. Unfortunately a stent could not [name] placed during the procedure due to narrowing at the IVC from fibrotic change of the [name] iliac vein stent. As I understand things they are now going to approach this from both sides i.e. through an area in the internal jugular vein as well as the more standard distal approach and see if this area can [name] stented. The thought was 5 to 6 weeks before this could [name] arranged as it will "require 2 surgeons, insurance approval etc. The original wound on his anterior tibia looks better. He has the area on the left lateral tibia with localized swelling. He has a new area on the left medial calcaneus which is small punched out and very painful. I think this looks similar to wounds he has had in this area before. He has severe venous hypertension 1/15; anterior tibia wound has come down from a figure-of-eight to 1 wound. Has some depth which looks clean. The area on the left lateral calf looks better. We put additional compression in here and the edema in this area looks better. His major complaint is on the medial calcaneus. He had mentioned this last week and he has had wounds like this before. Pain is severe 1/22; anterior tibial wound seems to [name] closing at with shallower and better looking wound margin. He has an area anteriorly that I think was initially an area of folliculitis the area on the left lateral calf has closed over The small area on the medial heel that I cultured last week grew  methicillin sensitive staph aureus but resistant to the  doxycycline I gave him. I substituted cephalexin 500 every 6 and has only been on this for 2 days. Still says the pain is very significant 1/29; all the patient's wounds look quite good today including the anterior tibial which is closing in. The area above this also closing in finally the very painful area on the medial heel. He is completing antibiotics today this cultured methicillin sensitive staph aureus. Back to using silver alginate on the wounds 2/12; in general things are quite good here. He still does not have a date for attempting to stand his left common iliac vein. Still an insurance issue. I have been encouraged him to move on this by calling his insurance company. There is not a good option here. 2/26; continued nice improvement. He still has the open area in the mid tibia that is incompletely closed. Surprisingly today the difficult area on his left medial calcaneus that was so painful and inflamed has epithelialized over. 3/5; the patient has his stenting procedure attempt at [facility] on 3/25. He continues to make nice progress the only remaining wound is on the left anterior tibial area. His heel medially remains epithelialized. We have been using Sorbac 3/19; his stenting procedure is actually on 4/15 per the patient today. Unfortunately he was not here last week and did not use his pumps. He has an area of localized edema erythema and marked tenderness above where the patient claims the original wound was. T [name] truthful we all had trouble determining what was o new wound today and what was original wound. Patient thought it was the more distal medial area where is the more central area is what my nurse and I thought it was. In any case looking back on the pictures really did not clarify things. He has a small punched out area just medial to the tibia which the patient claims was his original wound superiorly he now has an open area which is superficial some satellite lesions very  significant erythema and tenderness more centrally and superiorly to what is supposedly his original wound. A lot of tenderness here. We have been using Sorbact I changed to silver alginate today. He will need antibiotic 3/22; back early for review of probable cellulitis in the mid tibia area. This also could have been localized stasis dermatitis from not changing his wrap and not using compression. We put him on doxycycline empirically things look better he is in less pain no systemic illness 3/26; in for his usual appointment. The cellulitis/localized stasis dermatitis we are concerned about a week ago has resolved. He is completing the doxycycline. I really think this was localized poor edema control rather than cellulitis although these things are difficult to [name] certain. Will use silver alginate on the remaining wound which looks healthy on the left anterior mid tibia 4/9; the cellulitis stasis dermatitis week concerned about 2 weeks ago is still resolved. His original wound in the mid tibia area is remains healed the area we are dealing with current currently is above this area by about 3 or 4 cm. Clean looking wound. No need for debridement. We have been using silver alginate The patient's attempt to recanalize his left common iliac vein is next Thursday. This is to [name] done at [facility] by Dr. Trula Ore and colleagues my understanding is that they will approach this from both sides 4/30; the patient underwent an extensive venous revascularization by Dr. Trula Ore at [facility] on 02/12/2020 this included  recanalization of the inferior vena cava, endovascular stenting of the inferior vena cava of the left common iliac vein the left iliac vein and the left femoral vein and venoplasty of the inferior vena cava left iliac vein common femoral vein [name] iliac vein and [name] femoral vein. The patient did well. He is now on a months worth of Lovenox and lieu of his Xarelto. He is not on any other antiplatelet agents.  We have been wrapping his leg with 4-layer compression. According to our nurses he had a nurse visit last week and was closed he remains closed but is concerned about some degree of swelling in the left leg. He is not in any pain but says he is hypersensitive 5/7; the patient is status post an extensive venous revascularization by Dr. Trula Ore at [facility]. This included recanalization of the inferior vena cava, endovascular stenting of the left common iliac vein left iliac vein and left femoral vein. The patient has done very well. His wounds are all closed. He has not been able to get into his 30/40 below-knee stockings however he has extremitease stockings at home and I have suggested these. He also has compression pumps and I have asked him to consider using these once a day and monitor the swelling in his lower extremity Readmission: 05/28/2020 on evaluation today patient presents for readmission here in the clinic concerning issues that he has been having currently with a reopening of his wound over the past 1-2 weeks [place] the [place] and tells me that he was not wearing his compression regularly like he is supposed to. He states that subsequently his leg began to swell and then subsequently following this he had wounds that open. Fortunately there is no signs of active infection at this time which is good news. He does tell me it appeared to [name] somewhat infected at one point but he thinks it was just fluid and drainage and not true active infection. Nonetheless he does have overall worsening with regard to the open wound at this time in the past he is done well with Hydrofera Blue than when he had less drainage we had switched to Sorbact. Patient History Information obtained from Patient. Allergies No Known Drug Allergies Family History Diabetes - Father,Siblings, Heart Disease - Father,Mother, Hypertension - Father, Kidney Disease - Father, No family history of Cancer, Hereditary  Spherocytosis, Lung Disease, Seizures, Stroke, Thyroid Problems, Tuberculosis. Social History Current every day smoker - 1 ppd, Marital Status - Married, Alcohol Use - Rarely, Drug Use - No History, Caffeine Use - Daily - coffee. Medical History Eyes Denies history of Cataracts, Glaucoma, Optic Neuritis Ear/Nose/Mouth/Throat Denies history of Chronic sinus problems/congestion, Middle ear problems Hematologic/Lymphatic Denies history of Anemia, Hemophilia, Human Immunodeficiency Virus, Lymphedema, Sickle Cell Disease Respiratory Patient has history of Asthma - as a child, Sleep Apnea - no CPap or BiPap Denies history of Aspiration, Chronic Obstructive Pulmonary Disease (COPD), Pneumothorax, Tuberculosis Cardiovascular Patient has history of Deep Vein Thrombosis - both legs, Phlebitis Denies history of Angina, Arrhythmia, Congestive Heart Failure, Coronary Artery Disease, Hypertension, Hypotension, Myocardial Infarction, Peripheral Arterial Disease, Peripheral Venous Disease, Vasculitis Gastrointestinal Denies history of Cirrhosis , Colitis, Crohnoos, Hepatitis A, Hepatitis B, Hepatitis C Endocrine Denies history of Type I Diabetes, Type II Diabetes Genitourinary Denies history of End Stage Renal Disease Immunological Denies history of Lupus Erythematosus, Raynaudoos, Scleroderma Integumentary (Skin) Denies history of History of Burn Musculoskeletal Patient has history of Osteoarthritis Denies history of Gout, Rheumatoid Arthritis, Osteomyelitis Neurologic  Patient has history of Neuropathy Denies history of Dementia, Quadriplegia, Paraplegia, Seizure Disorder Oncologic Denies history of Received Chemotherapy, Received Radiation Psychiatric Denies history of Anorexia/bulimia, Confinement Anxiety Hospitalization/Surgery History - IVC filter issues. - (R) radius fracture surgery. - IVC filter placed. Medical A Surgical History Notes nd Constitutional Symptoms (General  Health) obesity Hematologic/Lymphatic sickle cell trait Review of Systems (ROS) Constitutional Symptoms (General Health) Denies complaints or symptoms of Fatigue, Fever, Chills, Marked Weight Change. Eyes Denies complaints or symptoms of Dry Eyes, Vision Changes, Glasses / Contacts. Ear/Nose/Mouth/Throat Denies complaints or symptoms of Chronic sinus problems or rhinitis. Respiratory Denies complaints or symptoms of Chronic or frequent coughs, Shortness of Breath. Cardiovascular Denies complaints or symptoms of Chest pain. Gastrointestinal Denies complaints or symptoms of Frequent diarrhea, Nausea, Vomiting. Endocrine Denies complaints or symptoms of Heat/cold intolerance. Genitourinary Denies complaints or symptoms of Frequent urination. Integumentary (Skin) Complains or has symptoms of Wounds. Musculoskeletal Denies complaints or symptoms of Muscle Pain, Muscle Weakness. Neurologic Denies complaints or symptoms of Numbness/parasthesias. Psychiatric Denies complaints or symptoms of Claustrophobia, Suicidal. Objective Constitutional patient is hypertensive.. pulse regular and within target range for patient.Marland Kitchen respirations regular, non-labored and within target range for patient.Marland Kitchen temperature within target range for patient.. Well-nourished and well-hydrated in no acute distress. Vitals Time Taken: 9:18 AM, Height: 74 in, Source: Stated, Weight: 335 lbs, Source: Stated, BMI: 43, Temperature: 98.5 F, Pulse: 88 bpm, Respiratory Rate: 18 breaths/min, Blood Pressure: 148/93 mmHg. Eyes conjunctiva clear no eyelid edema noted. pupils equal round and reactive to light and accommodation. Ears, Nose, Mouth, and Throat no gross abnormality of ear auricles or external auditory canals. normal hearing noted during conversation. mucus membranes moist. Respiratory normal breathing without difficulty. Cardiovascular 1+ dorsalis pedis/posterior tibialis pulses. 2+ pitting edema of the  bilateral lower extremities. Musculoskeletal normal gait and posture. no significant deformity or arthritic changes, no loss or range of motion, no clubbing. Psychiatric this patient is able to make decisions and demonstrates good insight into disease process. Alert and Oriented x 3. pleasant and cooperative. General Notes: Upon inspection patient's wound bed actually showed signs of worsening in general at this time. Fortunately there does not appear to [name] any evidence of significant infection locally or systemically which is good news. With that being said I am concerned about the fact that his leg is quite swollen he does have signs of him hemosiderin staining as well as lymphedema noted at this point his blood flow does seem to [name] doing well he has had vascular intervention. Integumentary (Hair, Skin) Wound #18 status is Open. Original cause of wound was Gradually Appeared. The wound is located on the Left,Anterior Lower Leg. The wound measures 6.6cm length x 3.5cm width x 0.1cm depth; 18.143cm^2 area and 1.814cm^3 volume. There is Fat Layer (Subcutaneous Tissue) Exposed exposed. There is no tunneling or undermining noted. There is a medium amount of serosanguineous drainage noted. There is medium (34-66%) red granulation within the wound bed. There is a medium (34-66%) amount of necrotic tissue within the wound bed including Adherent Slough. Wound #19 status is Open. Original cause of wound was Gradually Appeared. The wound is located on the Left,Medial Lower Leg. The wound measures 0cm length x 0.5cm width x 0.2cm depth; 0.039cm^2 area and 0.008cm^3 volume. There is Fat Layer (Subcutaneous Tissue) Exposed exposed. There is no tunneling or undermining noted. There is a medium amount of serosanguineous drainage noted. The wound margin is distinct with the outline attached to the wound base. There is large (67-100%) red,  pink granulation within the wound bed. There is no necrotic tissue within  the wound bed. Assessment Active Problems ICD-10 Lymphedema, not elsewhere classified Chronic venous hypertension (idiopathic) with inflammation of left lower extremity Non-pressure chronic ulcer of other part of left lower leg with fat layer exposed Other thrombophilia Cellulitis of left lower limb Procedures Wound #18 Pre-procedure diagnosis of Wound #18 is a Lymphedema located on the Left,Anterior Lower Leg . There was a Excisional Skin/Subcutaneous Tissue Debridement with a total area of 9 sq cm performed by Lenda Kelp, PA. With the following instrument(s): Curette to remove Viable and Non-Viable tissue/material. Material removed includes Subcutaneous Tissue, Skin: Dermis, and Fibrin/Exudate after achieving pain control using Other (benzocaine, 20%). No specimens were taken. A time out was conducted at 09:58, prior to the start of the procedure. There was no bleeding. The procedure was tolerated well with a pain level of 0 throughout and a pain level of 0 following the procedure. Post Debridement Measurements: 6.6cm length x 3.5cm width x 0.1cm depth; 1.814cm^3 volume. Character of Wound/Ulcer Post Debridement is improved. Post procedure Diagnosis Wound #18: Same as Pre-Procedure Pre-procedure diagnosis of Wound #18 is a Lymphedema located on the Left,Anterior Lower Leg . There was a Four Layer Compression Therapy Procedure by Zandra Abts, RN. Post procedure Diagnosis Wound #18: Same as Pre-Procedure Plan Follow-up Appointments: Return Appointment in 1 week. Dressing Change Frequency: Do not change entire dressing for one week. Skin Barriers/Peri-Wound Care: Moisturizing lotion TCA Cream or Ointment Wound Cleansing: May shower with protection. Primary Wound Dressing: Hydrofera Blue Secondary Dressing: ABD pad Zetuvit or Kerramax Edema Control: 4 layer compression: Left lower extremity Avoid standing for long periods of time Elevate legs to the level of the heart or  above for 30 minutes daily and/or when sitting, a frequency of: Exercise regularly 1. I would recommend currently that we go ahead and initiate treatment with [facility] for the patient I think this will do well for him in the past he is done excellent with this therapy. 2. I am also can recommend that we go ahead and initiate treatment with a 4-layer compression wrap as well which she also has done well with in the past. 3. I am also going to suggest that we use triamcinolone cream or ointment to help with the skin quality [name] now he is very dry and the skin is very irritated in the periwound region of the left leg. We will see patient back for reevaluation in 1 week here in the clinic. If anything worsens or changes patient will contact our office for additional recommendations. Electronic Signature(s) Signed: 05/28/2020 10:13:57 AM By: Lenda Kelp PA-C Entered By: Lenda Kelp on 05/28/2020 10:13:56 -------------------------------------------------------------------------------- HxROS Details Patient Name: Date of Service: [name], [name] 05/28/2020 9:00 A M Medical Record Number: [id number] Patient Account Number: [id number] Date of Birth/Sex: Treating RN: 03-Aug-1976 (44 y.o. [name] Primary Care Provider: [name], [name] Other Clinician: Referring Provider: Treating Provider/Extender: [name], [name] in Treatment: 0 Information Obtained From Patient Constitutional Symptoms (General Health) Complaints and Symptoms: Negative for: Fatigue; Fever; Chills; Marked Weight Change Medical History: Past Medical History Notes: obesity Eyes Complaints and Symptoms: Negative for: Dry Eyes; Vision Changes; Glasses / Contacts Medical History: Negative for: Cataracts; Glaucoma; Optic Neuritis Ear/Nose/Mouth/Throat Complaints and Symptoms: Negative for: Chronic sinus problems or rhinitis Medical History: Negative for: Chronic sinus  problems/congestion; Middle ear problems Respiratory Complaints and Symptoms: Negative for: Chronic or frequent coughs; Shortness of  Breath Medical History: Positive for: Asthma - as a child; Sleep Apnea - no CPap or BiPap Negative for: Aspiration; Chronic Obstructive Pulmonary Disease (COPD); Pneumothorax; Tuberculosis Cardiovascular Complaints and Symptoms: Negative for: Chest pain Medical History: Positive for: Deep Vein Thrombosis - both legs; Phlebitis Negative for: Angina; Arrhythmia; Congestive Heart Failure; Coronary Artery Disease; Hypertension; Hypotension; Myocardial Infarction; Peripheral Arterial Disease; Peripheral Venous Disease; Vasculitis Gastrointestinal Complaints and Symptoms: Negative for: Frequent diarrhea; Nausea; Vomiting Medical History: Negative for: Cirrhosis ; Colitis; Crohns; Hepatitis A; Hepatitis B; Hepatitis C Endocrine Complaints and Symptoms: Negative for: Heat/cold intolerance Medical History: Negative for: Type I Diabetes; Type II Diabetes Genitourinary Complaints and Symptoms: Negative for: Frequent urination Medical History: Negative for: End Stage Renal Disease Integumentary (Skin) Complaints and Symptoms: Positive for: Wounds Medical History: Negative for: History of Burn Musculoskeletal Complaints and Symptoms: Negative for: Muscle Pain; Muscle Weakness Medical History: Positive for: Osteoarthritis Negative for: Gout; Rheumatoid Arthritis; Osteomyelitis Neurologic Complaints and Symptoms: Negative for: Numbness/parasthesias Medical History: Positive for: Neuropathy Negative for: Dementia; Quadriplegia; Paraplegia; Seizure Disorder Psychiatric Complaints and Symptoms: Negative for: Claustrophobia; Suicidal Medical History: Negative for: Anorexia/bulimia; Confinement Anxiety Hematologic/Lymphatic Medical History: Negative for: Anemia; Hemophilia; Human Immunodeficiency Virus; Lymphedema; Sickle Cell Disease Past Medical  History Notes: sickle cell trait Immunological Medical History: Negative for: Lupus Erythematosus; Raynauds; Scleroderma Oncologic Medical History: Negative for: Received Chemotherapy; Received Radiation Immunizations Pneumococcal Vaccine: Received Pneumococcal Vaccination: No Immunization Notes: unsure of when last Tetanus was Implantable Devices None Hospitalization / Surgery History Type of Hospitalization/Surgery IVC filter issues (R) radius fracture surgery IVC filter placed Family and Social History Cancer: No; Diabetes: Yes - Father,Siblings; Heart Disease: Yes - Father,Mother; Hereditary Spherocytosis: No; Hypertension: Yes - Father; Kidney Disease: Yes - Father; Lung Disease: No; Seizures: No; Stroke: No; Thyroid Problems: No; Tuberculosis: No; Current every day smoker - 1 ppd; Marital Status - Married; Alcohol Use: Rarely; Drug Use: No History; Caffeine Use: Daily - coffee; Financial Concerns: No; Food, Clothing or Shelter Needs: No; Support System Lacking: No; Transportation Concerns: No Electronic Signature(s) Signed: 05/28/2020 4:58:30 PM By: [name] RN Signed: 05/28/2020 5:19:16 PM By: Lenda Kelp PA-C Entered By: [name] on 05/28/2020 09:20:52 -------------------------------------------------------------------------------- SuperBill Details Patient Name: Date of Service: [name], [name] 05/28/2020 Medical Record Number: [id number] Patient Account Number: [id number] Date of Birth/Sex: Treating RN: [birth year] (44 y.o. [name] Primary Care Provider: [name], [name] Other Clinician: Referring Provider: Treating Provider/Extender: [name], [name] in Treatment: 0 Diagnosis Coding ICD-10 Codes Code Description I89.0 Lymphedema, not elsewhere classified I87.322 Chronic venous hypertension (idiopathic) with inflammation of left lower extremity L97.822 Non-pressure chronic ulcer of other part of left lower leg with fat  layer exposed D68.69 Other thrombophilia L03.116 Cellulitis of left lower limb Facility Procedures CPT4 Code: 40981191 9 Description: 9213 - WOUND CARE VISIT-LEV 3 EST PT Modifier: 25 Quantity: 1 CPT4 Code: 47829562 1 Description: 1042 - DEB SUBQ TISSUE 20 SQ CM/< ICD-10 Diagnosis Description L97.822 Non-pressure chronic ulcer of other part of left lower leg with fat layer expos Modifier: ed Quantity: 1 Physician Procedures : CPT4 Code Description Modifier [id number] 99213 - WC PHYS LEVEL 3 - EST PT 25 ICD-10 Diagnosis Description I89.0 Lymphedema, not elsewhere classified I87.322 Chronic venous hypertension (idiopathic) with inflammation of left lower extremity L97.822  Non-pressure chronic ulcer of other part of left lower leg with fat layer exposed D68.69 Other thrombophilia Quantity: 1 : 8469629 11042 - WC PHYS SUBQ TISS 20 SQ CM ICD-10 Diagnosis Description L97.822  Non-pressure chronic ulcer of other part of left lower leg with fat layer exposed Quantity: 1 Electronic Signature(s) Signed: 05/28/2020 10:14:14 AM By: Lenda Kelp PA-C Entered By: Lenda Kelp on 05/28/2020 10:14:13

## 2020-05-28 NOTE — Progress Notes (Signed)
AHMON, TOSI ([id number]) Visit Report for 05/28/2020 Abuse/Suicide Risk Screen Details Patient Name: Date of Service: [name], [place] [name] 05/28/2020 9:00 A M Medical Record Number: [id number] Patient Account Number: [id number] Date of Birth/Sex: Treating RN: 04-Jun-1976 (44 y.o. [name]) [name] Primary Care Esteven Overfelt: [name], [name] Other Clinician: Referring Copelyn Widmer: Treating Damoni Erker/Extender: [name], [name] in Treatment: 0 Abuse/Suicide Risk Screen Items Answer ABUSE RISK SCREEN: Has anyone close to you tried to hurt or harm you recentlyo No Do you feel uncomfortable with anyone in your familyo No Has anyone forced you do things that you didnt want to doo No Electronic Signature(s) Signed: 05/28/2020 4:58:30 PM By: [name] RN Entered By: [name] on 05/28/2020 09:21:14 -------------------------------------------------------------------------------- Activities of Daily Living Details Patient Name: Date of Service: [name], [place] [name] 05/28/2020 9:00 A M Medical Record Number: [id number] Patient Account Number: [id number] Date of Birth/Sex: Treating RN: 09/08/1976 (44 y.o. [name]) [name] Primary Care [name]: [name], [name] Other Clinician: Referring [name]: Treating Jareli Highland/Extender: [name], [name] in Treatment: 0 Activities of Daily Living Items Answer Activities of Daily Living (Please select one for each item) Drive Automobile Completely Able T Medications ake Completely Able Use T elephone Completely Able Care for Appearance Completely Able Use T oilet Completely Able Bath / Shower Completely Able Dress Self Completely Able Feed Self Completely Able Walk Completely Able Get In / Out Bed Completely Able Housework Completely Able Prepare Meals Completely Able Handle Money Completely Able Shop for Self Completely Able Electronic Signature(s) Signed: 05/28/2020 4:58:30 PM By: [name] RN Entered By: [name] on  05/28/2020 09:21:41 -------------------------------------------------------------------------------- Education Screening Details Patient Name: Date of Service: [name] Hough, [name] 05/28/2020 9:00 A M Medical Record Number: [id number] Patient Account Number: [id number] Date of Birth/Sex: Treating RN: [birth year] (44 y.o. [name]) [name] Primary Care [name]: [name], [name] Other Clinician: Referring [name]: Treating [name]/Extender: [name], [name] in Treatment: 0 Primary Learner Assessed: Patient Learning Preferences/Education Level/Primary Language Learning Preference: Explanation Preferred Language: English Cognitive Barrier Language Barrier: No Translator Needed: No Memory Deficit: No Emotional Barrier: No Cultural/Religious Beliefs Affecting Medical Care: No Physical Barrier Impaired Vision: No Impaired Hearing: No Decreased Hand dexterity: No Knowledge/Comprehension Knowledge Level: Medium Comprehension Level: High Ability to understand written instructions: High Ability to understand verbal instructions: High Motivation Anxiety Level: Anxious Cooperation: Cooperative Education Importance: Acknowledges Need Interest in Health Problems: Asks Questions Perception: Coherent Willingness to Engage in Self-Management High Activities: Readiness to Engage in Self-Management High Activities: Electronic Signature(s) Signed: 05/28/2020 4:58:30 PM By: [name] RN Entered By: [name] on 05/28/2020 09:22:44 -------------------------------------------------------------------------------- Fall Risk Assessment Details Patient Name: Date of Service: [name], [name] 05/28/2020 9:00 A M Medical Record Number: [id number] Patient Account Number: [id number] Date of Birth/Sex: Treating RN: 1976/09/19 (44 y.o. [name]) [name] Primary Care [name]: [name], [name] Other Clinician: Referring [name]: Treating [name]/Extender: [name],  [name] in Treatment: 0 Fall Risk Assessment Items Have you had 2 or more falls in the last 12 monthso 0 No Have you had any fall that resulted in injury in the last 12 monthso 0 No FALLS RISK SCREEN History of falling - immediate or within 3 months 0 No Secondary diagnosis (Do you have 2 or more medical diagnoseso) 0 No Ambulatory aid None/bed rest/wheelchair/nurse 0 No Crutches/cane/walker 0 No Furniture 0 No Intravenous therapy Access/Saline/Heparin Lock 0 No Gait/Transferring Normal/ bed rest/ wheelchair 0 No Weak (short steps with or without shuffle, stooped  but able to lift head while walking, may seek 0 No support from furniture) Impaired (short steps with shuffle, may have difficulty arising from chair, head down, impaired 0 No balance) Mental Status Oriented to own ability 0 No Electronic Signature(s) Signed: 05/28/2020 4:58:30 PM By: [name] RN Entered By: [name] on 05/28/2020 09:22:56 -------------------------------------------------------------------------------- Foot Assessment Details Patient Name: Date of Service: [name], [name] 05/28/2020 9:00 A M Medical Record Number: [id number] Patient Account Number: [id number] Date of Birth/Sex: Treating RN: Feb 28, 1976 (44 y.o. [name]) [name] Primary Care [name]: [name], [name] Other Clinician: Referring Adriano Bischof: Treating [name]/Extender: [name], [name] in Treatment: 0 Foot Assessment Items Site Locations + = Sensation present, - = Sensation absent, C = Callus, U = Ulcer R = Redness, W = Warmth, M = Maceration, PU = Pre-ulcerative lesion F = Fissure, S = Swelling, D = Dryness Assessment Right: Left: Other Deformity: No No Prior Foot Ulcer: No No Prior Amputation: No No Charcot Joint: No No Ambulatory Status: Ambulatory Without Help Gait: Steady Electronic Signature(s) Signed: 05/28/2020 4:58:30 PM By: [name] RN Entered By: [name] on 05/28/2020  09:25:33 -------------------------------------------------------------------------------- Nutrition Risk Screening Details Patient Name: Date of Service: [name], [name] 05/28/2020 9:00 A M Medical Record Number: [id number] Patient Account Number: [id number] Date of Birth/Sex: Treating RN: 06-Nov-1975 (44 y.o. [name]) [name] Primary Care Onisha Cedeno: [name], [name] Other Clinician: Referring Raidon Swanner: Treating Aitanna Haubner/Extender: [name], [name] in Treatment: 0 Height (in): 74 Weight (lbs): 335 Body Mass Index (BMI): 43 Nutrition Risk Screening Items Score Screening NUTRITION RISK SCREEN: I have an illness or condition that made me change the kind and/or amount of food I eat 0 No I eat fewer than two meals per day 0 No I eat few fruits and vegetables, or milk products 0 No I have three or more drinks of beer, liquor or wine almost every day 0 No I have tooth or mouth problems that make it hard for me to eat 0 No I don't always have enough money to buy the food I need 0 No I eat alone most of the time 0 No I take three or more different prescribed or over-the-counter drugs a day 1 Yes Without wanting to, I have lost or gained 10 pounds in the last six months 0 No I am not always physically able to shop, cook and/or feed myself 0 No Nutrition Protocols Good Risk Protocol 0 No interventions needed Moderate Risk Protocol High Risk Proctocol Risk Level: Good Risk Score: 1 Electronic Signature(s) Signed: 05/28/2020 4:58:30 PM By: [name] RN Entered By: [name] on 05/28/2020 09:23:13

## 2020-06-01 ENCOUNTER — Ambulatory Visit (INDEPENDENT_AMBULATORY_CARE_PROVIDER_SITE_OTHER): Payer: Medicare HMO | Admitting: Orthopaedic Surgery

## 2020-06-01 ENCOUNTER — Encounter: Payer: Self-pay | Admitting: Orthopaedic Surgery

## 2020-06-01 ENCOUNTER — Telehealth: Payer: Self-pay | Admitting: Radiology

## 2020-06-01 DIAGNOSIS — M1611 Unilateral primary osteoarthritis, right hip: Secondary | ICD-10-CM

## 2020-06-01 DIAGNOSIS — M1612 Unilateral primary osteoarthritis, left hip: Secondary | ICD-10-CM | POA: Diagnosis not present

## 2020-06-01 NOTE — Progress Notes (Signed)
HPI: [name] returns today due to bilateral hip pain.  He did not undergo the intra-articular injections of both hips due to the fact that he finds this facet joint injections very painful states he cannot think of the pain greatly with Imdur with hip injections.  He still having severe pain in both hips describes as constant achy pain with at times stabbing sensation.  Pain is worse with stairs or bending over.  He is leaning towards having hip replacement.  However he has questions about hip surgery and the postoperative recovery.  He also is questioning whether he could have both hips done at once.  Patient is on Xarelto and recently had stent placement on the right his iliac artery.  Has a history of DVTs.  Does report a superficial wound on his left lower leg but denies any fevers chills shortness of breath chest pain otherwise.  He does note he feels 1 leg is longer than the other but he is not sure which leg.  Review of systems see HPI otherwise negative  Physical exam: General: Well-developed well-nourished male in no acute distress mood and affect appropriate. Psych: Alert and oriented x3 Gait: He walks with an antalgic slow gait he rises slowly from a seated position.  He uses no assistive device to ambulate.   Impression: Bilateral hip osteoarthritis  Plan: He will talk things over with his wife but is leaning towards having a right total hip arthroplasty in the future.  He would have to come off his Xarelto.  He would have to check with his vascular surgeon to see when he can come off of the Xarelto.  He would need to be off for 4 days prior to surgery.  He is given Domenica Reamer card and will call us whenever he is ready to set up surgery.  Questions were encouraged and answered at length.  Risk benefits of surgery discussed with him risk include but are not limited to DVT/PE, wound healing problems, prolonged pain, worsening pain, blood loss, leg length discrepancy and nerve or vessel  injury.

## 2020-06-01 NOTE — Telephone Encounter (Signed)
Left voicemail for patient to return call.  Patient need to stop xarelto 4 days prior to surgery. Patient has has stent placed and needs to find out from vascular surgeon when it is ok to stop xarelto. This is dependant on when surgery can be scheduled.

## 2020-06-04 ENCOUNTER — Encounter (HOSPITAL_BASED_OUTPATIENT_CLINIC_OR_DEPARTMENT_OTHER): Payer: 59 | Attending: Physician Assistant | Admitting: Physician Assistant

## 2020-06-04 DIAGNOSIS — D6869 Other thrombophilia: Secondary | ICD-10-CM | POA: Insufficient documentation

## 2020-06-04 DIAGNOSIS — I890 Lymphedema, not elsewhere classified: Secondary | ICD-10-CM | POA: Diagnosis not present

## 2020-06-04 DIAGNOSIS — L97822 Non-pressure chronic ulcer of other part of left lower leg with fat layer exposed: Secondary | ICD-10-CM | POA: Insufficient documentation

## 2020-06-04 DIAGNOSIS — I87322 Chronic venous hypertension (idiopathic) with inflammation of left lower extremity: Secondary | ICD-10-CM | POA: Insufficient documentation

## 2020-06-04 DIAGNOSIS — L03116 Cellulitis of left lower limb: Secondary | ICD-10-CM | POA: Diagnosis not present

## 2020-06-04 NOTE — Progress Notes (Signed)
[name], [name] ([id number]) Visit Report for 06/04/2020 Arrival Information Details Patient Name: Date of Service: [place], [place] [name] 06/04/2020 1:00 PM Medical Record Number: [id number] Patient Account Number: [id number] Date of Birth/Sex: Treating RN: 01-Nov-1975 (44 y.o. [name]) [name] Primary Care Delphina Schum: [name], [name] Other Clinician: Referring Stina Gane: Treating [name]/Extender: [name], [name] in Treatment: 1 Visit Information History Since Last Visit All ordered tests and consults were completed: No Patient Arrived: Ambulatory Added or deleted any medications: No Arrival Time: 13:04 Any new allergies or adverse reactions: No Accompanied By: self Had a fall or experienced change in No Transfer Assistance: None activities of daily living that may affect Patient Identification Verified: Yes risk of falls: Secondary Verification Process Completed: Yes Signs or symptoms of abuse/neglect since last visito No Patient Has Alerts: Yes Hospitalized since last visit: No Patient Alerts: Patient on Blood Thinner Implantable device outside of the clinic excluding No cellular tissue based products placed in the center since last visit: Has Dressing in Place as Prescribed: Yes Has Compression in Place as Prescribed: Yes Pain Present Now: No Electronic Signature(s) Signed: 06/04/2020 4:42:20 PM By: [name] RN Entered By: [name] on 06/04/2020 13:04:52 -------------------------------------------------------------------------------- Compression Therapy Details Patient Name: Date of Service: [name], [place] [name] 06/04/2020 1:00 PM Medical Record Number: [id number] Patient Account Number: [id number] Date of Birth/Sex: Treating RN: 06/10/1976 (44 y.o. [name] Primary Care Babara Buffalo: [name], [name] Other Clinician: Referring Sarena Jezek: Treating [name]/Extender: [name], [name] in Treatment: 1 Compression Therapy Performed for Wound  Assessment: Wound #18 Left,Anterior Lower Leg Performed By: Clinician [name], RN Compression Type: Four Layer Post Procedure Diagnosis Same as Pre-procedure Electronic Signature(s) Signed: 06/04/2020 4:58:13 PM By: [name] Entered By: [name] on 06/04/2020 13:57:49 -------------------------------------------------------------------------------- Compression Therapy Details Patient Name: Date of Service: [name], MA [name] 06/04/2020 1:00 PM Medical Record Number: [id number] Patient Account Number: [id number] Date of Birth/Sex: Treating RN: 10/02/1976 (44 y.o. [name] Primary Care Destini Cambre: [name], [name] Other Clinician: Referring Imer Foxworth: Treating Anai Lipson/Extender: [name], [name] in Treatment: 1 Compression Therapy Performed for Wound Assessment: Wound #19 Left,Medial Lower Leg Performed By: Clinician [name], RN Compression Type: Four Layer Post Procedure Diagnosis Same as Pre-procedure Electronic Signature(s) Signed: 06/04/2020 4:58:13 PM By: [name] Entered By: [name] on 06/04/2020 13:57:49 -------------------------------------------------------------------------------- Encounter Discharge Information Details Patient Name: Date of Service: [name], MA [name] 06/04/2020 1:00 PM Medical Record Number: [id number] Patient Account Number: [id number] Date of Birth/Sex: Treating RN: [birth year] (44 y.o. [name] Primary Care Bellamy Rubey: [name], [name] Other Clinician: Referring Kariem Wolfson: Treating [name]/Extender: [name], [name] in Treatment: 1 Encounter Discharge Information Items Discharge Condition: Stable Ambulatory Status: Ambulatory Discharge Destination: Home Transportation: Private Auto Accompanied By: self Schedule Follow-up Appointment: Yes Clinical Summary of Care: Patient Declined Electronic Signature(s) Signed: 06/04/2020 5:16:39 PM By: [name] RN, BSN Entered By: [name] on 06/04/2020 14:21:59 -------------------------------------------------------------------------------- Lower Extremity Assessment Details Patient Name: Date of Service: [name] BE, MA [name] 06/04/2020 1:00 PM Medical Record Number: [id number] Patient Account Number: [id number] Date of Birth/Sex: Treating RN: 1975/11/20 (44 y.o. [name] Primary Care Kenn Rekowski: [name], [name] Other Clinician: Referring Champayne Kocian: Treating [name]/Extender: [name], [name] in Treatment: 1 Edema Assessment Assessed: [Left: No] [[name]: No] E[Left: dema] [[name]: :] Calf Left: [name]: Point of Measurement: 45 cm From Medial Instep 45 cm cm Ankle Left: [name]: Point of Measurement: 10 cm From Medial Instep 24 cm  cm Electronic Signature(s) Signed: 06/04/2020 4:42:20 PM By: [name] RN Entered By: [name] on 06/04/2020 13:12:24 -------------------------------------------------------------------------------- Multi-Disciplinary Care Plan Details Patient Name: Date of Service: [name], MA [name] 06/04/2020 1:00 PM Medical Record Number: [id number] Patient Account Number: [id number] Date of Birth/Sex: Treating RN: 1975-12-04 (44 y.o. [name] Primary Care Keylah Darwish: [name], [name] Other Clinician: Referring Yanai Hobson: Treating [name]/Extender: [name], [name] in Treatment: 1 Active Inactive Venous Leg Ulcer Nursing Diagnoses: Actual venous Insuffiency (use after diagnosis is confirmed) Goals: Patient will maintain optimal edema control Date Initiated: 05/28/2020 Target Resolution Date: 06/25/2020 Goal Status: Active Interventions: Compression as ordered Notes: Wound/Skin Impairment Nursing Diagnoses: Impaired tissue integrity Goals: Ulcer/skin breakdown will have a volume reduction of 30% by week 4 Date Initiated: 05/28/2020 Target Resolution Date: 06/25/2020 Goal Status:  Active Interventions: Provide education on ulcer and skin care Notes: Electronic Signature(s) Signed: 06/04/2020 4:58:13 PM By: [name] Entered By: [name] on 06/04/2020 13:21:37 -------------------------------------------------------------------------------- Pain Assessment Details Patient Name: Date of Service: [name], MA [name] 06/04/2020 1:00 PM Medical Record Number: [id number] Patient Account Number: [id number] Date of Birth/Sex: Treating RN: [birth year] (44 y.o. [name] Primary Care Tandre Conly: [name], [name] Other Clinician: Referring Shashwat Cleary: Treating Trenice Mesa/Extender: [name], [name] in Treatment: 1 Active Problems Location of Pain Severity and Description of Pain Patient Has Paino Yes Site Locations With Dressing Change: Yes Duration of the Pain. Constant / Intermittento Intermittent How Long Does it Lasto Hours: Minutes: 15 Rate the pain. Current Pain Level: 5 Worst Pain Level: 7 Least Pain Level: 0 Tolerable Pain Level: 5 Character of Pain Describe the Pain: Aching, Burning Pain Management and Medication Current Pain Management: Medication: Yes Cold Application: No Rest: Yes Massage: No Activity: No T.E.N.S.: No Heat Application: No Leg drop or elevation: No Is the Current Pain Management Adequate: Inadequate How does your wound impact your activities of daily livingo Sleep: No Bathing: No Appetite: No Relationship With Others: No Bladder Continence: No Emotions: No Bowel Continence: No Work: No Toileting: No Drive: No Dressing: No Hobbies: No Electronic Signature(s) Signed: 06/04/2020 4:42:20 PM By: [name] RN Entered By: [name] on 06/04/2020 13:06:39 -------------------------------------------------------------------------------- Patient/Caregiver Education Details Patient Name: Date of Service: [name], MA [name] 8/6/2021andnbsp1:00 PM Medical Record Number: [id number] Patient Account  Number: [id number] Date of Birth/Gender: Treating RN: 1976-02-04 (44 y.o. [name] Primary Care Physician: [name], [name] Other Clinician: Referring Physician: Treating Physician/Extender: [name], [name] in Treatment: 1 Education Assessment Education Provided To: Patient Education Topics Provided Wound/Skin Impairment: Handouts: Caring for Your Ulcer Methods: Explain/Verbal Responses: State content correctly Electronic Signature(s) Signed: 06/04/2020 4:58:13 PM By: [name] Entered By: [name] on 06/04/2020 13:21:51 -------------------------------------------------------------------------------- Wound Assessment Details Patient Name: Date of Service: [name], MA [name] 06/04/2020 1:00 PM Medical Record Number: [id number] Patient Account Number: [id number] Date of Birth/Sex: Treating RN: April 18, 1976 (44 y.o. [name] Primary Care Niveah Boerner: [name], [name] Other Clinician: Referring Annaleah Arata: Treating Dorsie Sethi/Extender: [name], [name] in Treatment: 1 Wound Status Wound Number: 18 Primary Lymphedema Etiology: Wound Location: Left, Anterior Lower Leg Wound Open Wounding Event: Gradually Appeared Status: Date Acquired: 05/12/2020 Comorbid Asthma, Sleep Apnea, Deep Vein Thrombosis, Phlebitis, Weeks Of Treatment: 1 History: Osteoarthritis, Neuropathy Clustered Wound: No Wound Measurements Length: (cm) 4.5 Width: (cm) 3 Depth: (cm) 0.1 Area: (cm) 10.603 Volume: (cm) 1.06 % Reduction in Area: 41.6% % Reduction in Volume: 41.6% Epithelialization: None Tunneling: No Undermining: No Wound Description  Classification: Full Thickness Without Exposed Support Structures Exudate Amount: Medium Exudate Type: Serosanguineous Exudate Color: red, brown Foul Odor After Cleansing: No Slough/Fibrino Yes Wound Bed Granulation Amount: Medium (34-66%) Exposed Structure Granulation Quality: Red Fascia  Exposed: No Necrotic Amount: Medium (34-66%) Fat Layer (Subcutaneous Tissue) Exposed: Yes Necrotic Quality: Adherent Slough Tendon Exposed: No Muscle Exposed: No Joint Exposed: No Bone Exposed: No Treatment Notes Wound #18 (Left, Anterior Lower Leg) 2. Periwound Care Moisturizing lotion TCA Cream 3. Primary Dressing Applied Hydrofera Blue 4. Secondary Dressing Dry Gauze Other secondary dressing (specify in notes) 6. Support Layer Applied 4 layer compression wrap Notes zetuvit Electronic Signature(s) Signed: 06/04/2020 4:42:20 PM By: [name] RN Entered By: [name] on 06/04/2020 13:13:41 -------------------------------------------------------------------------------- Wound Assessment Details Patient Name: Date of Service: [name] BE, MA [name] 06/04/2020 1:00 PM Medical Record Number: [id number] Patient Account Number: [id number] Date of Birth/Sex: Treating RN: January 24, 1976 (44 y.o. [name]) [name] Primary Care Chandlor Noecker: [name], [name] Other Clinician: Referring Veleria Barnhardt: Treating Acelin Ferdig/Extender: [name], [name] in Treatment: 1 Wound Status Wound Number: 19 Primary Lymphedema Etiology: Wound Location: Left, Medial Lower Leg Wound Open Wounding Event: Gradually Appeared Status: Date Acquired: 05/12/2020 Comorbid Asthma, Sleep Apnea, Deep Vein Thrombosis, Phlebitis, Weeks Of Treatment: 1 History: Osteoarthritis, Neuropathy Clustered Wound: No Wound Measurements Length: (cm) 0.1 Width: (cm) 0.1 Depth: (cm) 0.1 Area: (cm) 0.008 Volume: (cm) 0.001 % Reduction in Area: 79.5% % Reduction in Volume: 87.5% Epithelialization: None Tunneling: No Undermining: No Wound Description Classification: Full Thickness Without Exposed Support Structures Wound Margin: Distinct, outline attached Exudate Amount: Medium Exudate Type: Serosanguineous Exudate Color: red, brown Foul Odor After Cleansing: No Slough/Fibrino No Wound Bed Granulation Amount:  Large (67-100%) Exposed Structure Granulation Quality: Red, Pink Fascia Exposed: No Necrotic Amount: None Present (0%) Fat Layer (Subcutaneous Tissue) Exposed: Yes Tendon Exposed: No Muscle Exposed: No Joint Exposed: No Bone Exposed: No Treatment Notes Wound #19 (Left, Medial Lower Leg) 2. Periwound Care Moisturizing lotion TCA Cream 3. Primary Dressing Applied Hydrofera Blue 4. Secondary Dressing Dry Gauze Other secondary dressing (specify in notes) 6. Support Layer Applied 4 layer compression wrap Notes zetuvit Electronic Signature(s) Signed: 06/04/2020 4:42:20 PM By: [name] RN Entered By: [name] on 06/04/2020 13:13:04 -------------------------------------------------------------------------------- Vitals Details Patient Name: Date of Service: [name] BE, MA [name] 06/04/2020 1:00 PM Medical Record Number: [id number] Patient Account Number: [id number] Date of Birth/Sex: Treating RN: [birth year] (44 y.o. [name] Primary Care Tamaria Dunleavy: [name], [name] Other Clinician: Referring Kaliope Quinonez: Treating Kimila Papaleo/Extender: [name], [name] in Treatment: 1 Vital Signs Time Taken: 13:04 Temperature (F): 98.2 Height (in): 74 Pulse (bpm): 105 Weight (lbs): 335 Respiratory Rate (breaths/min): 18 Body Mass Index (BMI): 43 Blood Pressure (mmHg): 150/92 Reference Range: 80 - 120 mg / dl Electronic Signature(s) Signed: 06/04/2020 4:42:20 PM By: [name] RN Entered By: [name] on 06/04/2020 13:05:24

## 2020-06-07 NOTE — Progress Notes (Signed)
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 mostly when he changes position. He apparently had a duplex ultrasound that was negative for clot but is scheduled for a CT scan to look at the upper vasculature. His wound areas on the left leg which are the ones we have been dealing with are a lot better. We have been using silver collagen 2/25; the patient has a small area on the medial left tibia which is just about closed and a smaller area on the medial left calf. We have been using silver collagen 3/3; the area on the left anterior tibia is closed and a smaller area on the medial left calf superiorly is just about fully epithelialized. We have been using silver collagen 02/03/2019 Readmission The patient called after his appointment a month ago to say that he was healed over. He went back into his 30/40  below-knee compression stockings. He states about a week or 2 after this he developed an  open area in the same part of the left anterior tibial area. His stockings are about a year old. He feels they may have punched around the area that broke down. He was not putting anything over this but nor had we really instructed him to. We use silver collagen to close this out the last time under 4-layer compression. He is definitely going to need new stockings 4/13; general things look better this is on his left anterior tibia. We have been using silver collagen 4/20; using silver collagen. Dimensions are smaller. Left anterior tibia 4/27; using silver collagen. Dimensions continue to [name] smaller on the left anterior tibia [facility] tells me that last week there was a small scab on the medial part of the left foot. None of [place] recorded this. He states that over the course of the week he developed increasing pain in this area. He took the compression off last night expecting to see a large open wound however a small amount of tissue came out of this area to reveal a small wound which otherwise looks somewhat benign yet he is complaining of extreme pain in this area. 5/4; using silver collagen to the major area on the left anterior tibia that continues to get smaller [facility] is developed a additional wound on the left medial foot. Undoubtedly this was probably a break in his skin that became secondarily infected. Very painful I gave him doxycycline last week he is still saying that this is painful but not quite as bad as last time 5/11; using silver collagen to the major area in the left anterior tibia ooThe area on the left medial foot culture grew methicillin sensitive staph aureus I gave him 10 days in total of doxycycline which should have covered this. 5/18-.Returns at 1 week, has been in 4 layer compression on the left, using alginate for the left leg and Prisma on the left foot READMISSION 6/25 He returns to  clinic today with a 3 week history of an opening on the left anterior mid tibia area. He has been applying silver collagen to this. When he left our clinic we ordered him 30-40 over the toe stockings. He states his edema is under as good control in this leg as he is ever seen it. Over the last 2 days he has developed an area on the left medial ankle/foot. This is the area that we worked on last time they cultured staph I gave him antibiotics and this closed over. The patient has a history of pulmonary embolism with an IVC filter. 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 He is on chronic anticoagulation. 7/2; the area on the left mid tibia did not have a viable surface today somewhat surprising adherent black necrotic surface. Not even really eschared. No evidence of infection. We did silver alginate last week 7/9; left mid tibia somewhat improved surface and slightly smaller. Using Iodoflex. 7/16 left mid tibia. Continues to [name] slightly smaller with improved surface using Iodoflex. He did not see Dr. Trula Ore with regards to the CT scan on the [name] leg because his insurance did not approve it 7/23; left mid tibia. No change in surface area however the wound surface looks better. He has another small area superiorly which is a small hyper granulated lesion. But this is of unclear etiology however it is defined is new this week. He also had significant bleeding reported by our intake nurse the exact source of this  was unclear. 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 His original wound looks quite a bit better and how it every he is developed over the last 3 [name] a raised painful area just above the wound. I am not sure if this represents a primary cutaneous issue or a venous issue. He seems to have a palpable vein underneath this which is tender may represent superficial  venous thrombosis. He is already on Xarelto. 8/6-Patient returns at 1 week for his left mid tibial wounds, the more proximal of the 2 wounds was raised painful area described last time, patient states this is less painful than before, he is almost completed his doxycycline, the wound area is larger but the wound itself is less painful. 8/13-Left anterior leg wound looks smaller and overall making good progress the other wound has healed. We are using Hydrofera Blue 8/20; the patient has 2 open areas. The original inferior area and the new area superiorly. He has been using Hydrofera Blue ready but the wounds overall look dry. He has not managed to get a CT scan approved and hence has not seen Dr. Trula Ore at [facility] 8/27 most of the patient's wound areas on the anterior look epithelialized. There is still some eschar and vulnerability here. He has been wearing to press stockings which should give him 30/40 mmHg compression. He also has compression pumps that belonged to his father. I think it would [name] reasonable to try and get him external compression pumps. He was not using the compression pumps daily and these certainly need to [name] used daily. Finally he was approved for his CT scan on the [name] with follow-up with Dr. Trula Ore. We will asked Dr. Trula Ore to look at his left leg as well 9/3; the patient had 2 wound areas. Central tibial area of area is healed. Smaller wound superiorly still perhaps slightly open. I believe his CT scan on the [name] hip and pelvis areas on the 18th. That was ordered by Dr. Trula Ore. We have ordered him 30/40 stockings and are going to attempt to get him compression pumps 9/10-Patient returns at 1 week for the 2 small areas on his left anterior leg, these areas are healed. Patient is now going to going to the 30/40 stockings. His CT scan of his [name] leg hip and pelvis areas is scheduled 9/22 the patient was discharged from the clinic on 9/10. He tells me that he started  developing pain on the left anterior tibial area and swelling on Thursday of last week. By Saturday the swelling was so prominent that he was scared to take his stocking off because he would [name] able to get it back on. There was increased pain. He was seen in the emergency department at [facility] on 9/1. 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 He was also felt to have cellulitis. There was apparently not any acute clot. He I he was put on a 3 time a day medication/antibiotic which I am assuming is clindamycin. He has 2 reopened areas both on the anterior tibia 9/29; he completed the clindamycin even though it caused diarrhea. The diarrhea is resolved. The cellulitis in the left leg that was prominent last week has resolved. He also had a CT scan of the pelvis done which I think did not show anything on the [name]  but did show obstruction of theo Common iliac vein on the left [he says in close proximity to the IVC]. I looked in [place] link I do not see the actual CT scan report. He is being apparently scheduled for an attempt at stenting retrograde and then if that does not work anterior grade by Dr. Trula Ore. We use silver alginate to the wound last week because of coexistent cellulitis 10/6; cellulitis on the left leg resolved. He is still waiting for insurance authorization for his pelvic vein stenting. Changed him to [facility] last week 10/13; cellulitis remains resolved. Wound is smaller. Using Hydrofera Blue under compression. In 2 days time he is going for his venous stenting hopefully by Dr. Trula Ore at [facility] 10/20; the patient went for his procedure last Thursday by Dr. Trula Ore although he does not remember in the postop period what he was told. He is not certain whether Dr. Trula Ore managed to get the stent then successfully. His wound is  measuring smaller looks healthy on the left mid tibia. He is also having some discomfort behind his left knee that he had me look at which is the site where Dr. Trula Ore had venous access. I looked in care everywhere. I could not see a specific note from Dr. Trula Ore [[facility]] above the actual procedure. Presumably it is still in transfer therefore I could not really answer his question about whether the stent was placed successful 10/27; wound not quite as good as last week. Using Hydrofera Blue. He thinks it might of stuck to the wound and was adherent when they took it off today. His procedure with Dr. Trula Ore was not successful. He thinks that Dr. Trula Ore will try to go at this from the other direction at some point. He sees him in 2- 1/2-week 11/3; quite an improvement in wound surface area this week. We are using Hydrofera Blue. No need to change the dressing. Follows up with Dr. Trula Ore in about a week and a half 11/10; wound surface not as good this week at all. No changes in dimensions. We have been using Hydrofera Blue. As well he had tells [place] he had discomfort in his foot all week although he admits he was up on this more than usual. He comes in today with 2 small punched out areas on the left lateral foot. He has severe venous hypertension. He sees Dr. Trula Ore again on 11/16 11/17; not much change in the area on the anterior tibia mid aspect. He also has 2 areas on the left lateral foot. We have been using Sorbact on the left anterior and Iodosorb ointment on the left foot. He is under 4-layer compression. The patient went to see Dr. Trula Ore I do not yet have access to this note and care everywhere. However according to the patient there is an option to try and address his venous occlusion I think via a transjugular approach. This is complicated by the fact that the IVC filter is there and would have to [name] traversed. According to the patient there is about a 33% chance of perforation may [name]  even aortic perforation. However they would [name] prepared for this. He is thinking about this and discussing it with his wife Thanks to our case manager this week we are able to determine if for some reason the patient is not using his compression pumps. He also is not able to get stockings on the [name] leg which are 30/40 below-knee. I think we may need to order external compression  garments 12/1; patient arrives with the area on the left anterior mid tibia expanded superiorly small rim of skin between the original area and the new area. He has painful small denuded areas on the left medial and left lateral heel. We have been using Iodoflex. His next appointment with the Dr. Trula Ore is January 4. after the holidays to discuss if he would like to pursue this endovascular option. Dr. Sherrin Daisy notes below from last visit Subjective Joanne Brander is a 44 y.o. 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 Unfortunately during the procedure a stent was not able to [name] placed d/t narrowing at the IVC. Per the patient the procedure provided 2-3 days of symptomatic relief (decrease in L LE pressure and pain) but after that his symptoms returned with increased pressure, pain in the hips and pain in the legs when standing. The patient does report his L LE wound is stable especially when he is consistently attending his wound care clinic in [place]. The patient denies using compressive LE therapies unless done at his wound clinic due to not being able to reach his legs. He is interested in learning more and possibly pursing further interventional options for his L lower extremity via access through neck. *Objective Physical Exam: BP 171/86   Pulse 80   T emp 37 C (T emporal)   Wt (!)  147.4 kg (325 lb)   BMI 41.73 kg/m General: awake, alert and oriented x 3 Chest: Non labored breathing on room air CVS: Normal rate. Regular rhythm. ABD: soft, NT ND, Ext: bilateral leg edema w/ left leg in compressive dressing from wound clinic. Evidence of skin discoloration on both feet bilaterally. Data Review: Labs: None to review Imaging:Reviewed report of 10/15 L LE venogram. I saw and evaluated the patient, participating in the key portions of the service. I reviewed the residentoos note. I agree with the residentoos findings and plan. Electronically signed by Derry Skill, MD at 09/16/2019 10:53 AM EST 12/11 the areas around his ankles have healed. 2 areas in the center part of the mid tibia area are still open we have been using silver collagen. Procedure attempt by Dr. Trula Ore at [facility] on 11/03/2019 12/18; the wounds around his ankles remain closed. I think there has been some improvement in the 2 areas in the center part of the mid tibia. Especially superiorly. Debris on the center required debridement we have been using silver collagen 12/31; I saw the patient briefly last week when he was here for a nurse visit. He had irritation in which look believed to [name] localized cellulitis and folliculitis on the lateral left calf. I gave him a prescription for cephalexin although he admits today he never filled it. The area still looked very angry. His original wounds we have been treating where a figure-of-eight shaped area on the anterior tibial area mid aspect. We have been using silver collagen these do not look healthy. 11/07/2019. The patient has completed his antibiotics. The area of folliculitis on the left lateral calf looks somewhat better although there is still erythema there is no tenderness. I am going to put some more compression on this area and will have a look at this next week. No additional antibiotics. Since the patient was last here he went to  see Dr. Trula Ore at  [facility]. I am able to see his note from 11/03/2019 in care everywhere. The patient has a history of a [name] iliac vein stent and IVC filter placement. He is status post angioplasty of the left common iliac vein on 08/14/2019 for chronic obstruction and venous insufficiency. Unfortunately a stent could not [name] placed during the procedure due to narrowing at the IVC from fibrotic change of the [name] iliac vein stent. As I understand things they are now going to approach this from both sides i.e. through an area in the internal jugular vein as well as the more standard distal approach and see if this area can [name] stented. The thought was 5 to 6 [name] before this could [name] arranged as it will "require 2 surgeons, insurance approval etc. The original wound on his anterior tibia looks better. He has the area on the left lateral tibia with localized swelling. He has a new area on the left medial calcaneus which is small punched out and very painful. I think this looks similar to wounds he has had in this area before. He has severe venous hypertension 1/15; anterior tibia wound has come down from a figure-of-eight to 1 wound. Has some depth which looks clean. The area on the left lateral calf looks better. We put additional compression in here and the edema in this area looks better. His major complaint is on the medial calcaneus. He had mentioned this last week and he has had wounds like this before. Pain is severe 1/22; anterior tibial wound seems to [name] closing at with shallower and better looking wound margin. He has an area anteriorly that I think was initially an area of folliculitis the area on the left lateral calf has closed over The small area on the medial heel that I cultured last week grew methicillin sensitive staph aureus but resistant to the doxycycline I gave him. I substituted cephalexin 500 every 6 and has only been on this for 2 days. Still says the pain is very significant 1/29; all the patient's  wounds look quite good today including the anterior tibial which is closing in. The area above this also closing in finally the very painful area on the medial heel. He is completing antibiotics today this cultured methicillin sensitive staph aureus. Back to using silver alginate on the wounds 2/12; in general things are quite good here. He still does not have a date for attempting to stand his left common iliac vein. Still an insurance issue. I have been encouraged him to move on this by calling his insurance company. There is not a good option here. 2/26; continued nice improvement. He still has the open area in the mid tibia that is incompletely closed. Surprisingly today the difficult area on his left medial calcaneus that was so painful and inflamed has epithelialized over. 3/5; the patient has his stenting procedure attempt at [facility] on 3/25. He continues to make nice progress the only remaining wound is on the left anterior tibial area. His heel medially remains epithelialized. We have been using Sorbac 3/19; his stenting procedure is actually on 4/15 per the patient today. Unfortunately he was not here last week and did not use his pumps. He has an area of localized edema erythema and marked tenderness above where the patient claims the original wound was. T [name] truthful we all had trouble determining what was o new wound today and what was original wound. Patient thought it was the more distal medial area  where is the more central area is what my nurse and I thought it was. In any case looking back on the pictures really did not clarify things. He has a small punched out area just medial to the tibia which the patient claims was his original wound superiorly he now has an open area which is superficial some satellite lesions very significant erythema and tenderness more centrally and superiorly to what is supposedly his original wound. A lot of tenderness here. We have been using Sorbact I changed  to silver alginate today. He will need antibiotic 3/22; back early for review of probable cellulitis in the mid tibia area. This also could have been localized stasis dermatitis from not changing his wrap and not using compression. We put him on doxycycline empirically things look better he is in less pain no systemic illness 3/26; in for his usual appointment. The cellulitis/localized stasis dermatitis we are concerned about a week ago has resolved. He is completing the doxycycline. I really think this was localized poor edema control rather than cellulitis although these things are difficult to [name] certain. Will use silver alginate on the remaining wound which looks healthy on the left anterior mid tibia 4/9; the cellulitis stasis dermatitis week concerned about 2 [name] ago is still resolved. His original wound in the mid tibia area is remains healed the area we are dealing with current currently is above this area by about 3 or 4 cm. Clean looking wound. No need for debridement. We have been using silver alginate The patient's attempt to recanalize his left common iliac vein is next Thursday. This is to [name] done at [facility] by Dr. Trula Ore and colleagues my understanding is that they will approach this from both sides 4/30; the patient underwent an extensive venous revascularization by Dr. Trula Ore at [facility] on 02/12/2020 this included recanalization of the inferior vena cava, endovascular stenting of the inferior vena cava of the left common iliac vein the left iliac vein and the left femoral vein and venoplasty of the inferior vena cava left iliac vein common femoral vein [name] iliac vein and [name] femoral vein. The patient did well. He is now on a months worth of Lovenox and lieu of his Xarelto. He is not on any other antiplatelet agents. We have been wrapping his leg with 4-layer compression. According to our nurses he had a nurse visit last week and was closed he remains closed but is concerned about some  degree of swelling in the left leg. He is not in any pain but says he is hypersensitive 5/7; the patient is status post an extensive venous revascularization by Dr. Trula Ore at [facility]. This included recanalization of the inferior vena cava, endovascular stenting of the left common iliac vein left iliac vein and left femoral vein. The patient has done very well. His wounds are all closed. He has not been able to get into his 30/40 below-knee stockings however he has extremitease stockings at home and I have suggested these. He also has compression pumps and I have asked him to consider using these once a day and monitor the swelling in his lower extremity Readmission: 05/28/2020 on evaluation today patient presents for readmission here in the clinic concerning issues that he has been having currently with a reopening of his wound over the past 1-2 [name] and tells me that he was not wearing his compression regularly like he is supposed to. He states that subsequently his leg began to swell  and then subsequently following this he had wounds that open. Fortunately there is no signs of active infection at this time which is good news. He does tell me it appeared to [name] somewhat infected at one point but he thinks it was just fluid and drainage and not true active infection. Nonetheless he does have overall worsening with regard to the open wound at this time in the past he is done well with Hydrofera Blue than when he had less drainage we had switched to Sorbact. 06/04/2020 upon evaluation today patient presents for follow-up concerning his leg ulcer. He has been tolerating the dressing changes without complication. Fortunately there is no signs of active infection at this time which is great news. No fevers, chills, nausea, vomiting, or diarrhea. Objective Constitutional Well-nourished and well-hydrated in no acute distress. Vitals Time Taken: 1:04 PM, Height: 74 in, Weight: 335 lbs,  BMI: 43, Temperature: 98.2 F, Pulse: 105 bpm, Respiratory Rate: 18 breaths/min, Blood Pressure: 150/92 mmHg. Cardiovascular trace pitting edema of the bilateral lower extremities. Psychiatric this patient is able to make decisions and demonstrates good insight into disease process. Alert and Oriented x 3. pleasant and cooperative. General Notes: Patient's wound bed currently showed signs of good granulation he has been tolerating the dressing changes without complication. Fortunately there is no evidence of infection currently and I do believe the [facility] is actually doing a great job for him. I would recommend that we continue with this at this point. No sharp debridement was necessary today. Integumentary (Hair, Skin) Wound #18 status is Open. Original cause of wound was Gradually Appeared. The wound is located on the Left,Anterior Lower Leg. The wound measures 4.5cm length x 3cm width x 0.1cm depth; 10.603cm^2 area and 1.06cm^3 volume. There is Fat Layer (Subcutaneous Tissue) Exposed exposed. There is no tunneling or undermining noted. There is a medium amount of serosanguineous drainage noted. There is medium (34-66%) red granulation within the wound bed. There is a medium (34-66%) amount of necrotic tissue within the wound bed including Adherent Slough. Wound #19 status is Open. Original cause of wound was Gradually Appeared. The wound is located on the Left,Medial Lower Leg. The wound measures 0.1cm length x 0.1cm width x 0.1cm depth; 0.008cm^2 area and 0.001cm^3 volume. There is Fat Layer (Subcutaneous Tissue) Exposed exposed. There is no tunneling or undermining noted. There is a medium amount of serosanguineous drainage noted. The wound margin is distinct with the outline attached to the wound base. There is large (67-100%) red, pink granulation within the wound bed. There is no necrotic tissue within the wound bed. Assessment Active Problems ICD-10 Lymphedema, not elsewhere  classified Chronic venous hypertension (idiopathic) with inflammation of left lower extremity Non-pressure chronic ulcer of other part of left lower leg with fat layer exposed Other thrombophilia Cellulitis of left lower limb Procedures Wound #18 Pre-procedure diagnosis of Wound #18 is a Lymphedema located on the Left,Anterior Lower Leg . There was a Four Layer Compression Therapy Procedure by [name], RN. Post procedure Diagnosis Wound #18: Same as Pre-Procedure Wound #19 Pre-procedure diagnosis of Wound #19 is a Lymphedema located on the Left,Medial Lower Leg . There was a Four Layer Compression Therapy Procedure by [name], RN. Post procedure Diagnosis Wound #19: Same as Pre-Procedure Plan Follow-up Appointments: Return Appointment in 1 week. Dressing Change Frequency: Do not change entire dressing for one week. Skin Barriers/Peri-Wound Care: Moisturizing lotion TCA Cream or Ointment Wound Cleansing: May shower with protection. Primary Wound Dressing: Hydrofera Blue Secondary Dressing:  ABD pad Zetuvit or Kerramax Edema Control: 4 layer compression: Left lower extremity Avoid standing for long periods of time Elevate legs to the level of the heart or above for 30 minutes daily and/or when sitting, a frequency of: Exercise regularly 1. I recommend currently that we continue with the [facility] I do feel like that has been beneficial for the patient. 2. I am also can recommend that we have the patient continue to elevate his legs much as possible to try to keep edema under good control. 3. I am also can recommend that he have the 4-layer compression wrap applied and continued at this seems to [name] controlling his edema quite well. We will see patient back for reevaluation in 1 week here in the clinic. If anything worsens or changes patient will contact our office for additional recommendations. Electronic Signature(s) Signed: 06/04/2020 2:10:44 PM By: Lenda Kelp PA-C Entered By: Lenda Kelp on 06/04/2020 14:10:44 -------------------------------------------------------------------------------- SuperBill Details Patient Name: Date of Service: [name], MA [name] 06/04/2020 Medical Record Number: [id number] Patient Account Number: [id number] Date of Birth/Sex: Treating RN: January 15, 1976 (44 y.o. [name] Primary Care Provider: Docia [name], [name] Other Clinician: Referring Provider: Treating Provider/Extender: [name], [name] in Treatment: 1 Diagnosis Coding ICD-10 Codes Code Description I89.0 Lymphedema, not elsewhere classified I87.322 Chronic venous hypertension (idiopathic) with inflammation of left lower extremity L97.822 Non-pressure chronic ulcer of other part of left lower leg with fat layer exposed D68.69 Other thrombophilia L03.116 Cellulitis of left lower limb Facility Procedures CPT4 Code: [id number] Description: (Facility Use Only) [phone] - APPLY MULTLAY COMPRS LWR LT LEG Modifier: Quantity: 1 Physician Procedures : CPT4 Code Description Modifier [id number] - WC PHYS LEVEL 3 - EST PT ICD-10 Diagnosis Description I89.0 Lymphedema, not elsewhere classified I87.322 Chronic venous hypertension (idiopathic) with inflammation of left lower extremity L97.822  Non-pressure chronic ulcer of other part of left lower leg with fat layer exposed D68.69 Other thrombophilia Quantity: 1 Electronic Signature(s) Signed: 06/04/2020 2:15:40 PM By: Lenda Kelp PA-C Entered By: Lenda Kelp on 06/04/2020 14:15:40

## 2020-06-10 ENCOUNTER — Encounter (HOSPITAL_BASED_OUTPATIENT_CLINIC_OR_DEPARTMENT_OTHER): Payer: 59 | Admitting: Internal Medicine

## 2020-06-10 DIAGNOSIS — L97822 Non-pressure chronic ulcer of other part of left lower leg with fat layer exposed: Secondary | ICD-10-CM | POA: Diagnosis not present

## 2020-06-11 ENCOUNTER — Encounter (HOSPITAL_BASED_OUTPATIENT_CLINIC_OR_DEPARTMENT_OTHER): Payer: 59 | Admitting: Internal Medicine

## 2020-06-11 NOTE — Progress Notes (Signed)
TADARRIUS, BURCH ([id number]) Visit Report for 06/10/2020 Arrival Information Details Patient Name: Date of Service: [name], [place] [name] 06/10/2020 7:30 A M Medical Record Number: [id number] Patient Account Number: [id number] Date of Birth/Sex: Treating RN: [birth year] (44 y.o. [name] Primary Care Raenette Sakata: [name], [name] Other Clinician: Referring Charron Coultas: Treating Mada Sadik/Extender: Rolm Gala, [name] in Treatment: 1 Visit Information History Since Last Visit Added or deleted any medications: No Patient Arrived: Ambulatory Any new allergies or adverse reactions: No Arrival Time: 07:49 Had a fall or experienced change in No Accompanied By: self activities of daily living that may affect Transfer Assistance: None risk of falls: Patient Identification Verified: Yes Signs or symptoms of abuse/neglect since last visito No Secondary Verification Process Completed: Yes Hospitalized since last visit: No Patient Has Alerts: Yes Implantable device outside of the clinic excluding No Patient Alerts: Patient on Blood Thinner cellular tissue based products placed in the center since last visit: Has Dressing in Place as Prescribed: Yes Has Compression in Place as Prescribed: Yes Pain Present Now: No Electronic Signature(s) Signed: 06/10/2020 5:05:13 PM By: [name] Entered By: [name] on 06/10/2020 07:51:32 -------------------------------------------------------------------------------- Compression Therapy Details Patient Name: Date of Service: [name], [name] 06/10/2020 7:30 A M Medical Record Number: [id number] Patient Account Number: [id number] Date of Birth/Sex: Treating RN: Apr 27, 1976 (44 y.o. [name] Primary Care Kelyn Koskela: [name], [name] Other Clinician: Referring Chundra Sauerwein: Treating Kumar Falwell/Extender: Rolm Gala, [name] in Treatment: 1 Compression Therapy Performed for Wound Assessment: Wound #18 Left,Anterior  Lower Leg Performed By: Clinician Zandra Abts, RN Compression Type: Four Layer Post Procedure Diagnosis Same as Pre-procedure Electronic Signature(s) Signed: 06/11/2020 5:19:54 PM By: Zandra Abts RN, BSN Entered By: Zandra Abts on 06/10/2020 08:12:11 -------------------------------------------------------------------------------- Encounter Discharge Information Details Patient Name: Date of Service: [name], [name] 06/10/2020 7:30 A M Medical Record Number: [id number] Patient Account Number: [id number] Date of Birth/Sex: Treating RN: [birth year] (44 y.o. [name] Primary Care Mitch Arquette: [name], [name] Other Clinician: Referring Ambera Fedele: Treating Cherrelle Plante/Extender: Rolm Gala, [name] in Treatment: 1 Encounter Discharge Information Items Discharge Condition: Stable Ambulatory Status: Ambulatory Discharge Destination: Home Transportation: Private Auto Accompanied By: self Schedule Follow-up Appointment: Yes Clinical Summary of Care: Patient Declined Electronic Signature(s) Signed: 06/11/2020 5:26:58 PM By: [name] RN, BSN Entered By: [name] on 06/10/2020 08:32:13 -------------------------------------------------------------------------------- Lower Extremity Assessment Details Patient Name: Date of Service: [name], [name] 06/10/2020 7:30 A M Medical Record Number: [id number] Patient Account Number: [id number] Date of Birth/Sex: Treating RN: 1976/03/30 (44 y.o. [name] Primary Care Penne Rosenstock: [name], [name] Other Clinician: Referring Jean Alejos: Treating Shonice Wrisley/Extender: Rolm Gala, [name] in Treatment: 1 Edema Assessment Assessed: [Left: No] [Right: No] E[Left: dema] [Right: :] Calf Left: Right: Point of Measurement: 45 cm From Medial Instep 45 cm cm Ankle Left: Right: Point of Measurement: 10 cm From Medial Instep 24 cm cm Vascular Assessment Pulses: Dorsalis Pedis Palpable:  [Left:Yes] Electronic Signature(s) Signed: 06/10/2020 5:05:13 PM By: [name] Entered By: [name] on 06/10/2020 07:55:34 -------------------------------------------------------------------------------- Multi-Disciplinary Care Plan Details Patient Name: Date of Service: [name], [name] 06/10/2020 7:30 A M Medical Record Number: [id number] Patient Account Number: [id number] Date of Birth/Sex: Treating RN: Mar 02, 1976 (44 y.o. [name] Primary Care Ita Fritzsche: [name], [name] Other Clinician: Referring Andon Villard: Treating Jenisha Faison/Extender: Rolm Gala, [name] in Treatment: 1 Active Inactive Venous Leg Ulcer Nursing Diagnoses: Actual venous Insuffiency (use after diagnosis is confirmed) Goals: Patient will maintain optimal edema control Date Initiated: 05/28/2020  Target Resolution Date: 06/25/2020 Goal Status: Active Interventions: Compression as ordered Notes: Wound/Skin Impairment Nursing Diagnoses: Impaired tissue integrity Goals: Ulcer/skin breakdown will have a volume reduction of 30% by week 4 Date Initiated: 05/28/2020 Target Resolution Date: 06/25/2020 Goal Status: Active Interventions: Provide education on ulcer and skin care Notes: Electronic Signature(s) Signed: 06/11/2020 5:19:54 PM By: Zandra Abts RN, BSN Entered By: Zandra Abts on 06/10/2020 08:17:59 -------------------------------------------------------------------------------- Pain Assessment Details Patient Name: Date of Service: [name], [name] 06/10/2020 7:30 A M Medical Record Number: [id number] Patient Account Number: [id number] Date of Birth/Sex: Treating RN: 1976/01/21 (44 y.o. [name] Primary Care Lucee Brissett: [name], [name] Other Clinician: Referring Edker Punt: Treating Deward Sebek/Extender: Rolm Gala, [name] in Treatment: 1 Active Problems Location of Pain Severity and Description of Pain Patient Has Paino No Site  Locations Pain Management and Medication Current Pain Management: Electronic Signature(s) Signed: 06/10/2020 5:05:13 PM By: [name] Entered By: [name] on 06/10/2020 07:52:04 -------------------------------------------------------------------------------- Patient/Caregiver Education Details Patient Name: Date of Service: [name], [name] 8/12/2021andnbsp7:30 A M Medical Record Number: [id number] Patient Account Number: [id number] Date of Birth/Gender: Treating RN: 05/02/1976 (44 y.o. [name] Primary Care Physician: [name], [name] Other Clinician: Referring Physician: Treating Physician/Extender: Rolm Gala, [name] in Treatment: 1 Education Assessment Education Provided To: Patient Education Topics Provided Wound/Skin Impairment: Methods: Explain/Verbal Responses: State content correctly Electronic Signature(s) Signed: 06/11/2020 5:19:54 PM By: Zandra Abts RN, BSN Entered By: Zandra Abts on 06/10/2020 08:18:11 -------------------------------------------------------------------------------- Wound Assessment Details Patient Name: Date of Service: [name], [name] 06/10/2020 7:30 A M Medical Record Number: [id number] Patient Account Number: [id number] Date of Birth/Sex: Treating RN: December 18, 1975 (44 y.o. [name] Primary Care Yareli Carthen: [name], [name] Other Clinician: Referring Kendre Sires: Treating Thelia Tanksley/Extender: Rolm Gala, [name] in Treatment: 1 Wound Status Wound Number: 18 Primary Lymphedema Etiology: Wound Location: Left, Anterior Lower Leg Wound Open Wounding Event: Gradually Appeared Status: Date Acquired: 05/12/2020 Comorbid Asthma, Sleep Apnea, Deep Vein Thrombosis, Phlebitis, Weeks Of Treatment: 1 History: Osteoarthritis, Neuropathy Clustered Wound: No Photos Photo Uploaded By: Benjaman Kindler on 06/11/2020 11:05:22 Wound Measurements Length: (cm) 4.6 Width: (cm) 2.6 Depth:  (cm) 0.1 Area: (cm) 9.393 Volume: (cm) 0.939 % Reduction in Area: 48.2% % Reduction in Volume: 48.2% Epithelialization: None Tunneling: No Undermining: No Wound Description Classification: Full Thickness Without Exposed Support Structures Wound Margin: Distinct, outline attached Exudate Amount: Medium Exudate Type: Serosanguineous Exudate Color: red, brown Foul Odor After Cleansing: No Slough/Fibrino Yes Wound Bed Granulation Amount: Large (67-100%) Exposed Structure Granulation Quality: Red Fascia Exposed: No Necrotic Amount: Small (1-33%) Fat Layer (Subcutaneous Tissue) Exposed: Yes Necrotic Quality: Adherent Slough Tendon Exposed: No Muscle Exposed: No Joint Exposed: No Bone Exposed: No Treatment Notes Wound #18 (Left, Anterior Lower Leg) 2. Periwound Care Moisturizing lotion TCA Cream 3. Primary Dressing Applied Hydrofera Blue 4. Secondary Dressing Dry Gauze Other secondary dressing (specify in notes) 6. Support Layer Applied 4 layer compression wrap Notes zetuvit Electronic Signature(s) Signed: 06/10/2020 5:05:13 PM By: [name] Entered By: [name] on 06/10/2020 07:58:41 -------------------------------------------------------------------------------- Wound Assessment Details Patient Name: Date of Service: [name], [name] 06/10/2020 7:30 A M Medical Record Number: [id number] Patient Account Number: [id number] Date of Birth/Sex: Treating RN: 12/09/1975 (44 y.o. [name] Primary Care Dilyn Smiles: [name], [name] Other Clinician: Referring Tijah Hane: Treating Trista Ciocca/Extender: Rolm Gala, [name] in Treatment: 1 Wound Status Wound Number: 19 Primary Lymphedema Etiology: Wound Location: Left, Medial Lower Leg Wound Open Wounding Event: Gradually Appeared Status: Date Acquired:  05/12/2020 Comorbid Asthma, Sleep Apnea, Deep Vein Thrombosis, Phlebitis, Weeks Of Treatment: 1 History: Osteoarthritis,  Neuropathy Clustered Wound: No Photos Photo Uploaded By: Benjaman Kindler on 06/11/2020 11:05:22 Wound Measurements Length: (cm) Width: (cm) Depth: (cm) Area: (cm) Volume: (cm) 0 % Reduction in Area: 100% 0 % Reduction in Volume: 100% 0 Epithelialization: Large (67-100%) 0 Tunneling: No 0 Undermining: No Wound Description Classification: Full Thickness Without Exposed Support Structures Wound Margin: Distinct, outline attached Exudate Amount: None Present Foul Odor After Cleansing: No Slough/Fibrino No Wound Bed Granulation Amount: None Present (0%) Exposed Structure Necrotic Amount: None Present (0%) Fascia Exposed: No Fat Layer (Subcutaneous Tissue) Exposed: No Tendon Exposed: No Muscle Exposed: No Joint Exposed: No Bone Exposed: No Electronic Signature(s) Signed: 06/10/2020 5:05:13 PM By: [name] Entered By: [name] on 06/10/2020 08:01:17 -------------------------------------------------------------------------------- Vitals Details Patient Name: Date of Service: [name], [name] 06/10/2020 7:30 A M Medical Record Number: [id number] Patient Account Number: [id number] Date of Birth/Sex: Treating RN: 01-May-1976 (44 y.o. [name] Primary Care Dezhane Staten: [name], [name] Other Clinician: Referring Travone Georg: Treating Danaye Sobh/Extender: Rolm Gala, [name] in Treatment: 1 Vital Signs Time Taken: 07:50 Temperature (F): 98.9 Height (in): 74 Pulse (bpm): 84 Weight (lbs): 335 Respiratory Rate (breaths/min): 18 Body Mass Index (BMI): 43 Blood Pressure (mmHg): 143/92 Reference Range: 80 - 120 mg / dl Electronic Signature(s) Signed: 06/10/2020 5:05:13 PM By: [name] Entered By: [name] on 06/10/2020 07:51:54

## 2020-06-11 NOTE — Progress Notes (Signed)
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 compression stockings. He states about a week or 2 after this he developed an open area in the same part of the left anterior tibial area. His stockings are about a year old. He feels they may have punched around the area that broke down. He was not putting anything over this but nor had we really instructed him to. We use silver collagen to close this out the last time under 4-layer compression. He is definitely going  to need new stockings 4/13; general things look better this is on his left anterior  tibia. We have been using silver collagen 4/20; using silver collagen. Dimensions are smaller. Left anterior tibia 4/27; using silver collagen. Dimensions continue to be smaller on the left anterior tibia [facility] tells me that last week there was a small scab on the medial part of the left foot. None of [place] recorded this. He states that over the course of the week he developed increasing pain in this area. He took the compression off last night expecting to see a large open wound however a small amount of tissue came out of this area to reveal a small wound which otherwise looks somewhat benign yet he is complaining of extreme pain in this area. 5/4; using silver collagen to the major area on the left anterior tibia that continues to get smaller [facility] is developed a additional wound on the left medial foot. Undoubtedly this was probably a break in his skin that became secondarily infected. Very painful I gave him doxycycline last week he is still saying that this is painful but not quite as bad as last time 5/11; using silver collagen to the major area in the left anterior tibia ooThe area on the left medial foot culture grew methicillin sensitive staph aureus I gave him 10 days in total of doxycycline which should have covered this. 5/18-.Returns at 1 week, has been in 4 layer compression on the left, using alginate for the left leg and Prisma on the left foot READMISSION 6/25 He returns to clinic today with a 3 week history of an opening on the left anterior mid tibia area. He has been applying silver collagen to this. When he left our clinic we ordered him 30-40 over the toe stockings. He states his edema is under as good control in this leg as he is ever seen it. Over the last 2 days he has developed an area on the left medial ankle/foot. This is the area that we worked on last time they cultured staph I gave him antibiotics and this closed over. The patient has a history of pulmonary embolism  with an IVC filter. 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 He is on chronic anticoagulation. 7/2; the area on the left mid tibia did not have a viable surface today somewhat surprising adherent black necrotic surface. Not even really eschared. No evidence of infection. We did silver alginate last week 7/9; left mid tibia somewhat improved surface and slightly smaller. Using Iodoflex. 7/16 left mid tibia. Continues to be slightly smaller with improved surface using Iodoflex. He did not see Dr. Trula Ore with regards to the CT scan on the right leg because his insurance did not approve it 7/23; left mid tibia. No change in surface area however the wound surface looks better. He has another small area superiorly which is a small hyper granulated lesion. But this is of unclear etiology however it is defined is new this week. He also had significant bleeding reported by our intake nurse the exact source of this was unclear. 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 His original wound looks quite a bit better and how it every he is developed over the last 3 weeks a raised painful area just above the wound. I am not sure if this represents a primary cutaneous issue or a venous  issue. He seems to have a palpable vein underneath this which is tender may represent superficial venous thrombosis. He is already on Xarelto. 8/6-Patient returns at 1 week for his left mid tibial wounds, the more proximal of the 2 wounds was raised painful area described last time, patient states this is less painful than before, he is almost completed his doxycycline, the wound area is larger but the wound itself is less painful. 8/13-Left anterior leg wound looks smaller and overall making good progress the other wound has healed. We are using Hydrofera Blue 8/20; the patient has 2 open areas. The  original inferior area and the new area superiorly. He has been using Hydrofera Blue ready but the wounds overall look dry. He has not managed to get a CT scan approved and hence has not seen Dr. Trula [facility]re at [facility] 8/27 most of the patient's wound areas on the anterior look epithelialized. There is still some eschar and vulnerability here. He has been wearing to press stockings which should give him 30/40 mmHg compression. He also has compression pumps that belonged to his father. I think it would be reasonable to try and get him external compression pumps. He was not using the compression pumps daily and these certainly need to be used daily. Finally he was approved for his CT scan on the right with follow-up with Dr. Trula Ore. We will asked Dr. Trula Ore to look at his left leg as well 9/3; the patient had 2 wound areas. Central tibial area of area is healed. Smaller wound superiorly still perhaps slightly open. I believe his CT scan on the right hip and pelvis areas on the 18th. That was ordered by Dr. Trula Ore. We have ordered him 30/40 stockings and are going to attempt to get him compression pumps 9/10-Patient returns at 1 week for the 2 small areas on his left anterior leg, these areas are healed. Patient is now going to going to the 30/40 stockings. His CT scan of his right leg hip and pelvis areas is scheduled 9/22 the patient was discharged from the clinic on 9/10. He tells me that he started developing pain on the left anterior tibial area and swelling on Thursday of last week. By Saturday the swelling was so prominent that he was scared to take his stocking off because he would be able to get it back on. There was increased pain. He was seen in the emergency department at [facility] on 9/1. 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 He was also felt to have  cellulitis. There was apparently not any acute clot. He I he was put on a 3 time a day medication/antibiotic which I am assuming is clindamycin. He has 2 reopened areas both on the anterior tibia 9/29; he completed the clindamycin even though it caused diarrhea. The diarrhea is resolved. The cellulitis in the left leg that was prominent last week has resolved. 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 I looked in [place] link I do not see the actual CT scan report. He is being apparently scheduled for an attempt at stenting retrograde and then if that does not work anterior grade by Dr. Trula Ore. We use silver alginate to  the wound last week because of coexistent cellulitis 10/6; cellulitis on the left leg resolved. He is still waiting for insurance authorization for his pelvic vein stenting. Changed him to [facility] last week 10/13; cellulitis remains resolved. Wound is smaller. Using Hydrofera Blue under compression. In 2 days time he is going for his venous stenting hopefully by Dr. Trula Ore at [facility] 10/20; the patient went for his procedure last Thursday by Dr. Trula Ore although he does not remember in the postop period what he was told. He is not certain whether Dr. Trula Ore managed to get the stent then successfully. His wound is measuring smaller looks healthy on the left mid tibia. He is also having some discomfort behind his left knee that he had me look at which is the site where Dr. Trula Ore had venous access. I looked in care everywhere. I could not see a specific note from Dr. Trula Ore [[facility]] above the actual procedure. Presumably it is still in transfer therefore I could not really answer his question about whether the stent was placed successful 10/27; wound not quite as good as last week. Using Hydrofera Blue. He thinks it might of  stuck to the wound and was adherent when they took it off today. His procedure with Dr. Trula Ore was not successful. He thinks that Dr. Trula Ore will try to go at this from the other direction at some point. He sees him in 2- 1/2-week 11/3; quite an improvement in wound surface area this week. We are using Hydrofera Blue. No need to change the dressing. Follows up with Dr. Trula Ore in about a week and a half 11/10; wound surface not as good this week at all. No changes in dimensions. We have been using Hydrofera Blue. As well he had tells [place] he had discomfort in his foot all week although he admits he was up on this more than usual. He comes in today with 2 small punched out areas on the left lateral foot. He has severe venous hypertension. He sees Dr. Trula Ore again on 11/16 11/17; not much change in the area on the anterior tibia mid aspect. He also has 2 areas on the left lateral foot. We have been using Sorbact on the left anterior and Iodosorb ointment on the left foot. He is under 4-layer compression. The patient went to see Dr. Trula Ore I do not yet have access to this note and care everywhere. However according to the patient there is an option to try and address his venous occlusion I think via a transjugular approach. This is complicated by the fact that the IVC filter is there and would have to be traversed. According to the patient there is about a 33% chance of perforation may be even aortic perforation. However they would be prepared for this. He is thinking about this and discussing it with his wife Thanks to our case manager this week we are able to determine if for some reason the patient is not using his compression pumps. He also is not able to get stockings on the right leg which are 30/40 below-knee. I think we may need to order external compression garments 12/1; patient arrives with the area on the left anterior mid tibia expanded superiorly small rim of skin between the original  area and the new area. He has painful small denuded areas on the left medial and left lateral heel. We have been using Iodoflex. His next appointment with the Dr. Trula Ore is January 4. after the holidays to discuss if he  would like to pursue this endovascular option. Dr. Sherrin Daisy notes below from last visit Subjective Sena Clouatre is a 44 y.o. 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 Unfortunately during the procedure a stent was not able to be placed d/t narrowing at the IVC. Per the patient the procedure provided 2-3 days of symptomatic relief (decrease in L LE pressure and pain) but after that his symptoms returned with increased pressure, pain in the hips and pain in the legs when standing. The patient does report his L LE wound is stable especially when he is consistently attending his wound care clinic in [place]. The patient denies using compressive LE therapies unless done at his wound clinic due to not being able to reach his legs. He is interested in learning more and possibly pursing further interventional options for his L lower extremity via access through neck. *Objective Physical Exam: BP 171/86  Pulse 80  T emp 37 C (T emporal)  Wt (!) 147.4 kg (325 lb)  BMI 41.73 kg/m General: awake, alert and oriented x 3 Chest: Non labored breathing on room air CVS: Normal rate. Regular rhythm. ABD: soft, NT ND, Ext: bilateral leg edema w/ left leg in compressive dressing from wound clinic. Evidence of skin discoloration on both feet bilaterally. Data Review: Labs: None to review Imaging:Reviewed report of 10/15 L LE venogram. I saw and evaluated the patient, participating in the key portions of the service. I reviewed the residentoos note. I agree  with the residentoos findings and plan. Electronically signed by Derry Skill, MD at 09/16/2019 10:53 AM EST 12/11 the areas around his ankles have healed. 2 areas in the center part of the mid tibia area are still open we have been using silver collagen. Procedure attempt by Dr. Trula Ore at [facility] on 11/03/2019 12/18; the wounds around his ankles remain closed. I think there has been some improvement in the 2 areas in the center part of the mid tibia. Especially superiorly. Debris on the center required debridement we have been using silver collagen 12/31; I saw the patient briefly last week when he was here for a nurse visit. He had irritation in which look believed to be localized cellulitis and folliculitis on the lateral left calf. I gave him a prescription for cephalexin although he admits today he never filled it. The area still looked very angry. His original wounds we have been treating where a figure-of-eight shaped area on the anterior tibial area mid aspect. We have been using silver collagen these do not look healthy. 11/07/2019. The patient has completed his antibiotics. The area of folliculitis on the left lateral calf looks somewhat better although there is still erythema there is no tenderness. I am going to put some more compression on this area and will have a look at this next week. No additional antibiotics. Since the patient was last here he went to see Dr. Trula Ore at [facility]. I am able to see his note from 11/03/2019 in care everywhere. The patient has a history of a right iliac vein stent and IVC filter placement. He is status post angioplasty of the left common iliac vein on 08/14/2019 for chronic obstruction and venous insufficiency. Unfortunately a stent could not be placed during the procedure due to narrowing at the IVC  from fibrotic change of the right iliac vein stent. As I understand things they are now going to approach this from both sides i.e. through an area in the  internal jugular vein as well as the more standard distal approach and see if this area can be stented. The thought was 5 to 6 weeks before this could be arranged as it will "require 2 surgeons, insurance approval etc. The original wound on his anterior tibia looks better. He has the area on the left lateral tibia with localized swelling. He has a new area on the left medial calcaneus which is small punched out and very painful. I think this looks similar to wounds he has had in this area before. He has severe venous hypertension 1/15; anterior tibia wound has come down from a figure-of-eight to 1 wound. Has some depth which looks clean. The area on the left lateral calf looks better. We put additional compression in here and the edema in this area looks better. His major complaint is on the medial calcaneus. He had mentioned this last week and he has had wounds like this before. Pain is severe 1/22; anterior tibial wound seems to be closing at with shallower and better looking wound margin. He has an area anteriorly that I think was initially an area of folliculitis the area on the left lateral calf has closed over The small area on the medial heel that I cultured last week grew methicillin sensitive staph aureus but resistant to the doxycycline I gave him. I substituted cephalexin 500 every 6 and has only been on this for 2 days. Still says the pain is very significant 1/29; all the patient's wounds look quite good today including the anterior tibial which is closing in. The area above this also closing in finally the very painful area on the medial heel. He is completing antibiotics today this cultured methicillin sensitive staph aureus. Back to using silver alginate on the wounds 2/12; in general things are quite good here. He still does not have a date for attempting to stand his left common iliac vein. Still an insurance issue. I have been encouraged him to move on this by calling his insurance  company. There is not a good option here. 2/26; continued nice improvement. He still has the open area in the mid tibia that is incompletely closed. Surprisingly today the difficult area on his left medial calcaneus that was so painful and inflamed has epithelialized over. 3/5; the patient has his stenting procedure attempt at [facility] on 3/25. He continues to make nice progress the only remaining wound is on the left anterior tibial area. His heel medially remains epithelialized. We have been using Sorbac 3/19; his stenting procedure is actually on 4/15 per the patient today. Unfortunately he was not here last week and did not use his pumps. He has an area of localized edema erythema and marked tenderness above where the patient claims the original wound was. T be truthful we all had trouble determining what was o new wound today and what was original wound. Patient thought it was the more distal medial area where is the more central area is what my nurse and I thought it was. In any case looking back on the pictures really did not clarify things. He has a small punched out area just medial to the tibia which the patient claims was his original wound superiorly he now has an open area which is superficial some satellite lesions very significant erythema and tenderness more  centrally and superiorly to what is supposedly his original wound. A lot of tenderness here. We have been using Sorbact I changed to silver alginate today. He will need antibiotic 3/22; back early for review of probable cellulitis in the mid tibia area. This also could have been localized stasis dermatitis from not changing his wrap and not using compression. We put him on doxycycline empirically things look better he is in less pain no systemic illness 3/26; in for his usual appointment. The cellulitis/localized stasis dermatitis we are concerned about a week ago has resolved. He is completing the doxycycline. I really think this was  localized poor edema control rather than cellulitis although these things are difficult to be certain. Will use silver alginate on the remaining wound which looks healthy on the left anterior mid tibia 4/9; the cellulitis stasis dermatitis week concerned about 2 weeks ago is still resolved. His original wound in the mid tibia area is remains healed the area we are dealing with current currently is above this area by about 3 or 4 cm. Clean looking wound. No need for debridement. We have been using silver alginate The patient's attempt to recanalize his left common iliac vein is next Thursday. This is to be done at [facility] by Dr. Trula Ore and colleagues my understanding is that they will approach this from both sides 4/30; the patient underwent an extensive venous revascularization by Dr. Trula Ore at [facility] on 02/12/2020 this included recanalization of the inferior vena cava, endovascular stenting of the inferior vena cava of the left common iliac vein the left iliac vein and the left femoral vein and venoplasty of the inferior vena cava left iliac vein common femoral vein right iliac vein and right femoral vein. The patient did well. He is now on a months worth of Lovenox and lieu of his Xarelto. He is not on any other antiplatelet agents. We have been wrapping his leg with 4-layer compression. According to our nurses he had a nurse visit last week and was closed he remains closed but is concerned about some degree of swelling in the left leg. He is not in any pain but says he is hypersensitive 5/7; the patient is status post an extensive venous revascularization by Dr. Trula Ore at [facility]. This included recanalization of the inferior vena cava, endovascular stenting of the left common iliac vein left iliac vein and left femoral vein. The patient has done very well. His wounds are all closed. He has not been able to get into his 30/40 below-knee stockings however he has extremitease stockings at home and I have  suggested these. He also has compression pumps and I have asked him to consider using these once a day and monitor the swelling in his lower extremity Readmission: 05/28/2020 on evaluation today patient presents for readmission here in the clinic concerning issues that he has been having currently with a reopening of his wound over the past 1-2 [place] and tells me that he was not wearing his compression regularly like he is supposed to. He states that subsequently his leg began to swell and then subsequently following this he had wounds that open. Fortunately there is no signs of active infection at this time which is good news. He does tell me it appeared to be somewhat infected at one point but he thinks it was just fluid and drainage and not true active infection. Nonetheless he does have overall worsening with regard to the open wound at this time  in the past he is done well with Hydrofera Blue than when he had less drainage we had switched to Sorbact. 06/04/2020 upon evaluation today patient presents for follow-up concerning his leg ulcer. He has been tolerating the dressing changes without complication. Fortunately there is no signs of active infection at this time which is great news. No fevers, chills, nausea, vomiting, or diarrhea. 06/10/20-Returns at 1 week under 4 layer compression, Hydrofera blue, with 1 open area closed up and the remaining on left anterior leg smaller Objective Constitutional alert and oriented x 3. sitting or standing blood pressure is within target range for patient.. supine blood pressure is within target range for patient.. pulse regular and within target range for patient.Marland Kitchen respirations regular, non-labored and within target range for patient.Marland Kitchen temperature within target range for patient.. Well- nourished and well-hydrated in no acute distress. Vitals Time Taken: 7:50 AM, Height: 74 in, Weight: 335 lbs, BMI: 43, Temperature: 98.9 F, Pulse: 84  bpm, Respiratory Rate: 18 breaths/min, Blood Pressure: 143/92 mmHg. General Notes: Left anterior leg wound with healthy granulation tissue at base, surrounding skin intact, left medial wound is closed Integumentary (Hair, Skin) Wound #18 status is Open. Original cause of wound was Gradually Appeared. The wound is located on the Left,Anterior Lower Leg. The wound measures 4.6cm length x 2.6cm width x 0.1cm depth; 9.393cm^2 area and 0.939cm^3 volume. There is Fat Layer (Subcutaneous Tissue) Exposed exposed. There is no tunneling or undermining noted. There is a medium amount of serosanguineous drainage noted. The wound margin is distinct with the outline attached to the wound base. There is large (67-100%) red granulation within the wound bed. There is a small (1-33%) amount of necrotic tissue within the wound bed including Adherent Slough. Wound #19 status is Open. Original cause of wound was Gradually Appeared. The wound is located on the Left,Medial Lower Leg. The wound measures 0cm length x 0cm width x 0cm depth; 0cm^2 area and 0cm^3 volume. There is no tunneling or undermining noted. There is a none present amount of drainage noted. The wound margin is distinct with the outline attached to the wound base. There is no granulation within the wound bed. There is no necrotic tissue within the wound bed. Assessment Active Problems ICD-10 Lymphedema, not elsewhere classified Chronic venous hypertension (idiopathic) with inflammation of left lower extremity Non-pressure chronic ulcer of other part of left lower leg with fat layer exposed Other thrombophilia Cellulitis of left lower limb Procedures Wound #18 Pre-procedure diagnosis of Wound #18 is a Lymphedema located on the Left,Anterior Lower Leg . There was a Four Layer Compression Therapy Procedure by Zandra Abts, RN. Post procedure Diagnosis Wound #18: Same as Pre-Procedure Plan Follow-up Appointments: Return Appointment in 1  week. Dressing Change Frequency: Wound #18 Left,Anterior Lower Leg: Do not change entire dressing for one week. Skin Barriers/Peri-Wound Care: Moisturizing lotion TCA Cream or Ointment - mixed with lotion Wound Cleansing: Wound #18 Left,Anterior Lower Leg: May shower with protection. - use cast protector Primary Wound Dressing: Wound #18 Left,Anterior Lower Leg: Hydrofera Blue Secondary Dressing: Wound #18 Left,Anterior Lower Leg: ABD pad Zetuvit or Kerramax Edema Control: 4 layer compression: Left lower extremity Avoid standing for long periods of time Elevate legs to the level of the heart or above for 30 minutes daily and/or when sitting, a frequency of: - throughout the day Exercise regularly -Continue Hydrofera Blue as primary dressing under 4 layer compression -Patient continue to keep his legs elevated -Return to clinic next week Electronic Signature(s) Signed: 06/10/2020 8:14:41  AM By: Cassandria Anger MD, MBA Entered By: Cassandria Anger on 06/10/2020 08:14:41 -------------------------------------------------------------------------------- SuperBill Details Patient Name: Date of Service: [name], [name] 06/10/2020 Medical Record Number: [id number] Patient Account Number: [id number] Date of Birth/Sex: Treating RN: 07-Jan-1976 (44 y.o. [name] Primary Care Provider: [name], [name] Other Clinician: Referring Provider: Treating Provider/Extender: Rolm Gala, [name] in Treatment: 1 Diagnosis Coding ICD-10 Codes Code Description I89.0 Lymphedema, not elsewhere classified I87.322 Chronic venous hypertension (idiopathic) with inflammation of left lower extremity L97.822 Non-pressure chronic ulcer of other part of left lower leg with fat layer exposed D68.69 Other thrombophilia L03.116 Cellulitis of left lower limb Facility Procedures CPT4 Code: [id number] Description: (Facility Use Only) [id number] - APPLY MULTLAY COMPRS LWR LT  LEG Modifier: Quantity: 1 Physician Procedures : CPT4 Code Description Modifier [id number] - WC PHYS LEVEL 3 - EST PT ICD-10 Diagnosis Description L97.822 Non-pressure chronic ulcer of other part of left lower leg with fat layer exposed Quantity: 1 Electronic Signature(s) Signed: 06/10/2020 8:14:56 AM By: Cassandria Anger MD, MBA Entered By: Cassandria Anger on 06/10/2020 08:14:56

## 2020-06-14 ENCOUNTER — Encounter (HOSPITAL_BASED_OUTPATIENT_CLINIC_OR_DEPARTMENT_OTHER): Payer: Medicare HMO | Admitting: Internal Medicine

## 2020-06-17 ENCOUNTER — Encounter (HOSPITAL_BASED_OUTPATIENT_CLINIC_OR_DEPARTMENT_OTHER): Payer: 59 | Admitting: Internal Medicine

## 2020-06-17 DIAGNOSIS — L97822 Non-pressure chronic ulcer of other part of left lower leg with fat layer exposed: Secondary | ICD-10-CM | POA: Diagnosis not present

## 2020-06-17 NOTE — Progress Notes (Signed)
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 wounds 2/18; patient tells me he was in the ER at [facility] last weekend. He has been experiencing increasing pain in his right upper thigh/groin area mostly when he changes position. He apparently had a duplex ultrasound that was negative for clot but is scheduled for a CT scan to look at the upper vasculature. His wound areas on the left leg which are the ones we have been dealing with are a lot better. We have  been using silver collagen 2/25; the patient has a small area on the medial left  tibia which is just about closed and a smaller area on the medial left calf. We have been using silver collagen 3/3; the area on the left anterior tibia is closed and a smaller area on the medial left calf superiorly is just about fully epithelialized. We have been using silver collagen 02/03/2019 Readmission The patient called after his appointment a month ago to say that he was healed over. He went back into his 30/40 below-knee compression stockings. He states about a week or 2 after this he developed an open area in the same part of the left anterior tibial area. His stockings are about a year old. He feels they may have punched around the area that broke down. He was not putting anything over this but nor had we really instructed him to. We use silver collagen to close this out the last time under 4-layer compression. He is definitely going to need new stockings 4/13; general things look better this is on his left anterior tibia. We have been using silver collagen 4/20; using silver collagen. Dimensions are smaller. Left anterior tibia 4/27; using silver collagen. Dimensions continue to be smaller on the left anterior tibia [facility] tells me that last week there was a small scab on the medial part of the left foot. None of [place] recorded this. He states that over the course of the week he developed increasing pain in this area. He took the compression off last night expecting to see a large open wound however a small amount of tissue came out of this area to reveal a small wound which otherwise looks somewhat benign yet he is complaining of extreme pain in this area. 5/4; using silver collagen to the major area on the left anterior tibia that continues to get smaller [facility] is developed a additional wound on the left medial foot. Undoubtedly this was probably a break in his skin that became secondarily infected. Very painful I gave him doxycycline last week he is still saying that this is painful but  not quite as bad as last time 5/11; using silver collagen to the major area in the left anterior tibia ooThe area on the left medial foot culture grew methicillin sensitive staph aureus I gave him 10 days in total of doxycycline which should have covered this. 5/18-.Returns at 1 week, has been in 4 layer compression on the left, using alginate for the left leg and Prisma on the left foot READMISSION 6/25 He returns to clinic today with a 3 week history of an opening on the left anterior mid tibia area. He has been applying silver collagen to this. When he left our clinic we ordered him 30-40 over the toe stockings. He states his edema is under as good control in this leg as he is ever seen it. Over the last 2 days he has developed an area on the left medial ankle/foot. This is the area that we worked on last time they cultured staph I gave him antibiotics and this closed over. The patient has a history of pulmonary embolism with an IVC filter. 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 He is on chronic anticoagulation. 7/2; the area on the left mid tibia did not have a viable surface today somewhat surprising adherent black necrotic surface. Not even really eschared. No evidence of infection. We did silver alginate last  week 7/9; left mid tibia somewhat improved surface and slightly smaller. Using Iodoflex. 7/16 left mid tibia. Continues to be slightly smaller with improved surface using Iodoflex. He did not see Dr. Trula Ore with regards to the CT scan on the right leg because his insurance did not approve it 7/23; left mid tibia. No change in surface area however the wound surface looks better. He has another small area superiorly which is a small hyper granulated lesion. But this is of unclear etiology however it is defined is new this week. He also had significant bleeding reported by our intake nurse the exact source of this was unclear. 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 His original wound looks quite a bit better and how it every he is developed over the last 3 weeks a raised painful area just above the wound. I am not sure if this represents a primary cutaneous issue or a venous issue. He seems to have a palpable vein underneath this which is tender may represent superficial venous thrombosis. He is already on Xarelto. 8/6-Patient returns at 1 week for his left mid tibial wounds, the more proximal of the 2 wounds was raised painful area described last time, patient states this is less painful than before, he is almost completed his doxycycline, the wound area is larger but the wound itself is less painful. 8/13-Left anterior leg wound looks smaller and overall making good progress the other wound has healed. We are using Hydrofera Blue 8/20; the patient has 2 open areas. The original inferior area and the new area superiorly. He has been using Hydrofera Blue ready but the wounds overall look dry. He has not managed to get a CT scan approved and hence has not seen Dr. Trula Ore at [facility] 8/27 most of the patient's wound areas on the anterior look epithelialized. There is still some eschar and vulnerability here. He has been wearing to press stockings which should give him 30/40 mmHg compression. He also has compression pumps that belonged to his father. I think it would be reasonable to try and get him external compression pumps. He was not using the compression pumps daily and these certainly need to be used daily. Finally he was approved for his CT scan on the right with follow-up with Dr. Trula Ore. We will asked Dr. Trula Ore to look at his left leg as well 9/3; the patient had 2 wound areas. Central tibial area of area is healed. Smaller wound superiorly still perhaps slightly open. I believe his CT scan on the right hip and pelvis areas on the 18th. That was  ordered by Dr. Trula Ore. We have ordered him 30/40 stockings and are going to attempt to get him compression pumps 9/10-Patient returns at 1 week for the 2 small areas on his left anterior leg, these areas are healed. Patient is now going to going to the 30/40 stockings. His CT scan of his right leg hip and pelvis areas is scheduled 9/22 the patient was discharged from the clinic on 9/10. He tells me that he started developing pain on the left anterior tibial area and swelling on Thursday of last week. By Saturday the swelling was so prominent that he was scared to take his stocking off because he would be able to get it back on. There was increased pain. He was seen in the emergency department at [facility] on 9/1. He had a duplex ultrasound that showed chronic thrombus  in the common femoral vein, saphenofemoral junction, femoral vein proximal mid and distal, popliteal and posterior tibial vein. He was also felt to have cellulitis. There was apparently not any acute clot. He I he was put on a 3 time a day medication/antibiotic which I am assuming is clindamycin. He has 2 reopened areas both on the anterior tibia 9/29; he completed the clindamycin even though it caused diarrhea. The diarrhea is resolved. The cellulitis in the left leg that was prominent last week has resolved. 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 I looked in [place] link I do not see the actual CT scan report. He is being apparently scheduled for an attempt at stenting retrograde and then if that does not work anterior grade by Dr. Trula Ore. We use silver alginate to the wound last week because of coexistent cellulitis 10/6; cellulitis on the left leg resolved. He is still waiting for insurance authorization for his pelvic vein stenting. Changed him to [facility] last  week 10/13; cellulitis remains resolved. Wound is smaller. Using Hydrofera Blue under compression. In 2 days time he is going for his venous stenting hopefully by Dr. Trula Ore at [facility] 10/20; the patient went for his procedure last Thursday by Dr. Trula Ore although he does not remember in the postop period what he was told. He is not certain whether Dr. Trula Ore managed to get the stent then successfully. His wound is measuring smaller looks healthy on the left mid tibia. He is also having some discomfort behind his left knee that he had me look at which is the site where Dr. Trula Ore had venous access. I looked in care everywhere. I could not see a specific note from Dr. Trula Ore [[facility]] above the actual procedure. Presumably it is still in transfer therefore I could not really answer his question about whether the stent was placed successful 10/27; wound not quite as good as last week. Using Hydrofera Blue. He thinks it might of stuck to the wound and was adherent when they took it off today. His procedure with Dr. Trula Ore was not successful. He thinks that Dr. Trula Ore will try to go at this from the other direction at some point. He sees him in 2- 1/2-week 11/3; quite an improvement in wound surface area this week. We are using Hydrofera Blue. No need to change the dressing. Follows up with Dr. Trula Ore in about a week and a half 11/10; wound surface not as good this week at all. No changes in dimensions. We have been using Hydrofera Blue. As well he had tells [place] he had discomfort in his foot all week although he admits he was up on this more than usual. He comes in today with 2 small punched out areas on the left lateral foot. He has severe venous hypertension. He sees Dr. Trula Ore again on 11/16 11/17; not much change in the area on the anterior tibia mid aspect. He also has 2 areas on the left lateral foot. We have been using Sorbact on the left anterior and Iodosorb ointment on the left foot. He is  under 4-layer compression. The patient went to see Dr. Trula Ore I do not yet have access to this note and care everywhere. However according to the patient there is an option to try and address his venous occlusion I  think via a transjugular approach. This is complicated by the fact that the IVC filter is there and would have to be traversed. According to the patient there is about a 33% chance of perforation may be even aortic perforation. However they would be prepared for this. He is thinking about this and discussing it with his wife Thanks to our case manager this week we are able to determine if for some reason the patient is not using his compression pumps. He also is not able to get stockings on the right leg which are 30/40 below-knee. I think we may need to order external compression garments 12/1; patient arrives with the area on the left anterior mid tibia expanded superiorly small rim of skin between the original area and the new area. He has painful small denuded areas on the left medial and left lateral heel. We have been using Iodoflex. His next appointment with the Dr. Trula Ore is January 4. after the holidays to discuss if he would like to pursue this endovascular option. Dr. Sherrin Daisy notes below from last visit Subjective Vikrant Pryce is a 44 y.o. 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 Unfortunately during the procedure a stent was not able to be placed d/t narrowing at the IVC. Per the patient the procedure provided 2-3 days of symptomatic relief (decrease in L LE pressure and pain) but after that his symptoms returned with increased pressure, pain in the hips and pain in the legs when standing. The patient does report his L LE wound is stable  especially when he is consistently attending his wound care clinic in [place]. The patient denies using compressive LE therapies unless done at his wound clinic due to not being able to reach his legs. He is interested in learning more and possibly pursing further interventional options for his L lower extremity via access through neck. *Objective Physical Exam: BP 171/86  Pulse 80  T emp 37 C (T emporal)  Wt (!) 147.4 kg (325 lb)  BMI 41.73 kg/m General: awake, alert and oriented x 3 Chest: Non labored breathing on room air CVS: Normal rate. Regular rhythm. ABD: soft, NT ND, Ext: bilateral leg edema w/ left leg in compressive dressing from wound clinic. Evidence of skin discoloration on both feet bilaterally. Data Review: Labs: None to review Imaging:Reviewed report of 10/15 L LE venogram. I saw and evaluated the patient, participating in the key portions of the service. I reviewed the residentoos note. I agree with the residentoos findings and plan. Electronically signed by Derry Skill, MD at 09/16/2019 10:53 AM EST 12/11 the areas around his ankles have healed. 2 areas in the center part of the mid tibia area are still open we have been using silver collagen. Procedure attempt by Dr. Trula Ore at [facility] on 11/03/2019 12/18; the wounds around his ankles remain closed. I think there has been some improvement in the 2 areas in the center part of the mid tibia. Especially superiorly. Debris on the center required debridement we have been using silver collagen 12/31; I saw the patient briefly last week when he was here for a nurse visit. He had irritation in which look believed to be localized cellulitis and folliculitis on the lateral left calf. I gave him a prescription for cephalexin although  he admits today he never filled it. The area still looked very angry. His original wounds we have been treating where a figure-of-eight shaped area on the anterior tibial area mid aspect.  We have been using silver collagen these do not look healthy. 11/07/2019. The patient has completed his antibiotics. The area of folliculitis on the left lateral calf looks somewhat better although there is still erythema there is no tenderness. I am going to put some more compression on this area and will have a look at this next week. No additional antibiotics. Since the patient was last here he went to see Dr. Trula Ore at [facility]. I am able to see his note from 11/03/2019 in care everywhere. The patient has a history of a right iliac vein stent and IVC filter placement. He is status post angioplasty of the left common iliac vein on 08/14/2019 for chronic obstruction and venous insufficiency. Unfortunately a stent could not be placed during the procedure due to narrowing at the IVC from fibrotic change of the right iliac vein stent. As I understand things they are now going to approach this from both sides i.e. through an area in the internal jugular vein as well as the more standard distal approach and see if this area can be stented. The thought was 5 to 6 weeks before this could be arranged as it will "require 2 surgeons, insurance approval etc. The original wound on his anterior tibia looks better. He has the area on the left lateral tibia with localized swelling. He has a new area on the left medial calcaneus which is small punched out and very painful. I think this looks similar to wounds he has had in this area before. He has severe venous hypertension 1/15; anterior tibia wound has come down from a figure-of-eight to 1 wound. Has some depth which looks clean. The area on the left lateral calf looks better. We put additional compression in here and the edema in this area looks better. His major complaint is on the medial calcaneus. He had mentioned this last week and he has had wounds like this before. Pain is severe 1/22; anterior tibial wound seems to be closing at with shallower and better looking  wound margin. He has an area anteriorly that I think was initially an area of folliculitis the area on the left lateral calf has closed over The small area on the medial heel that I cultured last week grew methicillin sensitive staph aureus but resistant to the doxycycline I gave him. I substituted cephalexin 500 every 6 and has only been on this for 2 days. Still says the pain is very significant 1/29; all the patient's wounds look quite good today including the anterior tibial which is closing in. The area above this also closing in finally the very painful area on the medial heel. He is completing antibiotics today this cultured methicillin sensitive staph aureus. Back to using silver alginate on the wounds 2/12; in general things are quite good here. He still does not have a date for attempting to stand his left common iliac vein. Still an insurance issue. I have been encouraged him to move on this by calling his insurance company. There is not a good option here. 2/26; continued nice improvement. He still has the open area in the mid tibia that is incompletely closed. Surprisingly today the difficult area on his left medial calcaneus that was so painful and inflamed has epithelialized over. 3/5; the patient has his stenting procedure attempt at  [facility] on 3/25. He continues to make nice progress the only remaining wound is on the left anterior tibial area. His heel medially remains epithelialized. We have been using Sorbac 3/19; his stenting procedure is actually on 4/15 per the patient today. Unfortunately he was not here last week and did not use his pumps. He has an area of localized edema erythema and marked tenderness above where the patient claims the original wound was. T be truthful we all had trouble determining what was o new wound today and what was original wound. Patient thought it was the more distal medial area where is the more central area is what my nurse and I thought it was. In  any case looking back on the pictures really did not clarify things. He has a small punched out area just medial to the tibia which the patient claims was his original wound superiorly he now has an open area which is superficial some satellite lesions very significant erythema and tenderness more centrally and superiorly to what is supposedly his original wound. A lot of tenderness here. We have been using Sorbact I changed to silver alginate today. He will need antibiotic 3/22; back early for review of probable cellulitis in the mid tibia area. This also could have been localized stasis dermatitis from not changing his wrap and not using compression. We put him on doxycycline empirically things look better he is in less pain no systemic illness 3/26; in for his usual appointment. The cellulitis/localized stasis dermatitis we are concerned about a week ago has resolved. He is completing the doxycycline. I really think this was localized poor edema control rather than cellulitis although these things are difficult to be certain. Will use silver alginate on the remaining wound which looks healthy on the left anterior mid tibia 4/9; the cellulitis stasis dermatitis week concerned about 2 weeks ago is still resolved. His original wound in the mid tibia area is remains healed the area we are dealing with current currently is above this area by about 3 or 4 cm. Clean looking wound. No need for debridement. We have been using silver alginate The patient's attempt to recanalize his left common iliac vein is next Thursday. This is to be done at [facility] by Dr. Trula Ore and colleagues my understanding is that they will approach this from both sides 4/30; the patient underwent an extensive venous revascularization by Dr. Trula Ore at [facility] on 02/12/2020 this included recanalization of the inferior vena cava, endovascular stenting of the inferior vena cava of the left common iliac vein the left iliac vein and the left  femoral vein and venoplasty of the inferior vena cava left iliac vein common femoral vein right iliac vein and right femoral vein. The patient did well. He is now on a months worth of Lovenox and lieu of his Xarelto. He is not on any other antiplatelet agents. We have been wrapping his leg with 4-layer compression. According to our nurses he had a nurse visit last week and was closed he remains closed but is concerned about some degree of swelling in the left leg. He is not in any pain but says he is hypersensitive 5/7; the patient is status post an extensive venous revascularization by Dr. Trula Ore at [facility]. This included recanalization of the inferior vena cava, endovascular stenting of the left common iliac vein left iliac vein and left femoral vein. The patient has done very well. His wounds are all closed. He has not been able to get into his  30/40 below-knee stockings however he has extremitease stockings at home and I have suggested these. He also has compression pumps and I have asked him to consider using these once a day and monitor the swelling in his lower extremity Readmission: 05/28/2020 on evaluation today patient presents for readmission here in the clinic concerning issues that he has been having currently with a reopening of his wound over the past 1-2 weeks [place] and tells me that he was not wearing his compression regularly like he is supposed to. He states that subsequently his leg began to swell and then subsequently following this he had wounds that open. Fortunately there is no signs of active infection at this time which is good news. He does tell me it appeared to be somewhat infected at one point but he thinks it was just fluid and drainage and not true active infection. Nonetheless he does have overall worsening with regard to the open wound at this time in the past he is done well with Hydrofera Blue than when he had less drainage we had switched to  Sorbact. 06/04/2020 upon evaluation today patient presents for follow-up concerning his leg ulcer. He has been tolerating the dressing changes without complication. Fortunately there is no signs of active infection at this time which is great news. No fevers, chills, nausea, vomiting, or diarrhea. 06/10/20-Returns at 1 week under 4 layer compression, Hydrofera blue, with 1 open area closed up and the remaining on left anterior leg smaller 8/19; I have not seen this patient since he returned. Is a patient with severe chronic venous insufficiency with central venous obstruction status post left common iliac vein stenting. This certainly helped and healed his wounds on the anterior leg and medial ankle the last time. He returned to clinic saying that he had been at the beach. He is compliant with his juxta lite stocking but was more active on his feet developed increasing edema and an opening on the left anterior mid tibia area. He is back in compression using Hydrofera Blue. There is a threatened area just above the wound laterally today. Other than that the wound itself does not look too bad Objective Constitutional Patient is hypertensive.. Pulse regular and within target range for patient.Marland Kitchen Respirations regular, non-labored and within target range.. Temperature is normal and within the target range for the patient.Marland Kitchen Appears in no distress. Vitals Time Taken: 7:56 AM, Height: 74 in, Weight: 335 lbs, BMI: 43, Temperature: 98.7 F, Pulse: 108 bpm, Respiratory Rate: 18 breaths/min, Blood Pressure: 168/97 mmHg. Cardiovascular His lower extremity swelling is well controlled. He says he has more edema in the upper thigh although I did not look at this.. General Notes: Wound exam; left anterior leg; quarter sized wound area on the mid anterior tibia. This does not look too bad. No need for debridement. Wound cleans up quite nicely. Slightly superiorly and laterally to this wound there is 2 areas that look  like blisters or developing blisters to me. We are going to have to keep an eye on these. Integumentary (Hair, Skin) Wound #18 status is Open. Original cause of wound was Gradually Appeared. The wound is located on the Left,Anterior Lower Leg. The wound measures 4.1cm length x 1.8cm width x 0.2cm depth; 5.796cm^2 area and 1.159cm^3 volume. There is Fat Layer (Subcutaneous Tissue) Exposed exposed. There is no tunneling or undermining noted. There is a medium amount of serosanguineous drainage noted. The wound margin is distinct with the outline attached to the  wound base. There is large (67-100%) red granulation within the wound bed. There is a small (1-33%) amount of necrotic tissue within the wound bed including Adherent Slough. Assessment Active Problems ICD-10 Lymphedema, not elsewhere classified Chronic venous hypertension (idiopathic) with inflammation of left lower extremity Non-pressure chronic ulcer of other part of left lower leg with fat layer exposed Other thrombophilia Cellulitis of left lower limb Procedures Wound #18 Pre-procedure diagnosis of Wound #18 is a Lymphedema located on the Left,Anterior Lower Leg . There was a Four Layer Compression Therapy Procedure by Zandra Abts, RN. Post procedure Diagnosis Wound #18: Same as Pre-Procedure Plan Follow-up Appointments: Return Appointment in 1 week. Dressing Change Frequency: Wound #18 Left,Anterior Lower Leg: Do not change entire dressing for one week. Skin Barriers/Peri-Wound Care: Moisturizing lotion TCA Cream or Ointment - mixed with lotion Wound Cleansing: Wound #18 Left,Anterior Lower Leg: May shower with protection. - use cast protector Primary Wound Dressing: Wound #18 Left,Anterior Lower Leg: Hydrofera Blue Other: - apply silver alginate to blistered areas proximal to wound Secondary Dressing: Wound #18 Left,Anterior Lower Leg: ABD pad Zetuvit or Kerramax Edema Control: 4 layer compression: Left lower  extremity Avoid standing for long periods of time Elevate legs to the level of the heart or above for 30 minutes daily and/or when sitting, a frequency of: - throughout the day Exercise regularly Segmental Compressive Device. - Lymphedema pumps twice a day for 1 hour each time #1 I continued with the Hydrofera Blue to the major wound under Kerramax/ABDs and 4-layer compression 2. I put some silver alginate and ABD over what I think are the blistering area superiorly and laterally. 3. I saw no evidence of infection, he does not have an arterial issue 4. We have no arterial issues. The patient has venous central obstruction as well as peripheral venous issues. Nevertheless as far as I can tell the leg looks stable. He states his last visit with Dr. Trula Ore at [facility] was last month I will see if I can look up this record 5. The patient tells me he has osteoarthritis of both his hips limiting his mobility and therefore his ability to exercise and help with fluid in his lower extremities. He is however compliant with his stockings and compression pumps. Optimistically this was caused by a trip to the beach and usual activities that people do when they are on vacation. Electronic Signature(s) Signed: 06/17/2020 5:57:44 PM By: Baltazar Najjar MD Entered By: Baltazar Najjar on 06/17/2020 08:48:08 -------------------------------------------------------------------------------- SuperBill Details Patient Name: Date of Service: [name] Hough, [name] 06/17/2020 Medical Record Number: [id number] Patient Account Number: [id number] Date of Birth/Sex: Treating RN: Nov 22, 1975 (44 y.o. [name] Primary Care Provider: [name], [name] Other Clinician: Referring Provider: Treating Provider/Extender: Carola Rhine, [name] in Treatment: 2 Diagnosis Coding ICD-10 Codes Code Description I89.0 Lymphedema, not elsewhere classified I87.322 Chronic venous hypertension (idiopathic) with  inflammation of left lower extremity L97.822 Non-pressure chronic ulcer of other part of left lower leg with fat layer exposed D68.69 Other thrombophilia L03.116 Cellulitis of left lower limb Facility Procedures CPT4 Code: [id number] Description: (Facility Use Only) 29581LT - APPLY MULTLAY COMPRS LWR LT LEG Modifier: Quantity: 1 Physician Procedures : CPT4 Code Description Modifier [id number] - WC PHYS LEVEL 4 - EST PT ICD-10 Diagnosis Description I87.322 Chronic venous hypertension (idiopathic) with inflammation of left lower extremity I89.0 Lymphedema, not elsewhere classified L97.822  Non-pressure chronic ulcer of other part of left lower leg with fat layer exposed Quantity: 1 Electronic Signature(s) Signed:  06/17/2020 5:36:57 PM By: Zandra Abts RN, BSN Signed: 06/17/2020 5:57:44 PM By: Baltazar Najjar MD Entered By: Zandra Abts on 06/17/2020 08:51:24

## 2020-06-18 ENCOUNTER — Encounter (HOSPITAL_BASED_OUTPATIENT_CLINIC_OR_DEPARTMENT_OTHER): Payer: 59 | Admitting: Internal Medicine

## 2020-06-24 ENCOUNTER — Other Ambulatory Visit: Payer: Self-pay

## 2020-06-24 ENCOUNTER — Encounter (HOSPITAL_BASED_OUTPATIENT_CLINIC_OR_DEPARTMENT_OTHER): Payer: 59 | Admitting: Internal Medicine

## 2020-06-24 DIAGNOSIS — L97822 Non-pressure chronic ulcer of other part of left lower leg with fat layer exposed: Secondary | ICD-10-CM | POA: Diagnosis not present

## 2020-06-24 NOTE — Progress Notes (Signed)
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 suspect this is an area of folliculitis however the amount of tenderness around this is somewhat more in diameter than I am used to seeing with this type of presentation. I am concerned enough to consider empiric oral antibiotics, there is nothing to culture here. The patient is on Xarelto he has no allergies 2/11; the patient completed a week's worth of antibiotics last time for folliculitis area on the left medial upper calf. This is expanded into a wound. More concerning than this he has eschar around his original wound on the left anterior tibia and several eschared areas around it which are small individually. He probably does not have great edema control. He asked to come back weekly to [name] rewrapped, he is concerned that his wife is not  able to do this consistently in terms of the amount  of compression. I agreed. Continuing with silver collagen to the wounds 2/18; patient tells me he was in the ER at [facility] last weekend. He has been experiencing increasing pain in his right upper thigh/groin area mostly when he changes position. He apparently had a duplex ultrasound that was negative for clot but is scheduled for a CT scan to look at the upper vasculature. His wound areas on the left leg which are the ones we have been dealing with are a lot better. We have been using silver collagen 2/25; the patient has a small area on the medial left tibia which is just about closed and a smaller area on the medial left calf. We have been using silver collagen 3/3; the area on the left anterior tibia is closed and a smaller area on the medial left calf superiorly is just about fully epithelialized. We have been using silver collagen 02/03/2019 Readmission The patient called after his appointment a month ago to say that he was healed over. He went back into his 30/40 below-knee compression stockings. He states about a week or 2 after this he developed an open area in the same part of the left anterior tibial area. His stockings are about a year old. He feels they may have punched around the area that broke down. He was not putting anything over this but nor had we really instructed him to. We use silver collagen to close this out the last time under 4-layer compression. He is definitely going to need new stockings 4/13; general things look better this is on his left anterior tibia. We have been using silver collagen 4/20; using silver collagen. Dimensions are smaller. Left anterior tibia 4/27; using silver collagen. Dimensions continue to [name] smaller on the left anterior tibia [facility] tells me that last week there was a small scab on the medial part of the left foot. None of [place] recorded this. He states that over the course of the week he developed increasing pain in this area. He took the  compression off last night expecting to see a large open wound however a small amount of tissue came out of this area to reveal a small wound which otherwise looks somewhat benign yet he is complaining of extreme pain in this area. 5/4; using silver collagen to the major area on the left anterior tibia that continues to get smaller [facility] is developed a additional wound on the left medial foot. Undoubtedly this was probably a break in his skin that became secondarily infected. Very painful I gave him doxycycline last week he is still saying that this is painful but not quite as bad as last time 5/11; using silver collagen to the major area in the left anterior tibia ooThe area on the left medial foot culture grew methicillin sensitive staph aureus I gave him 10 days in total of doxycycline which should have covered this. 5/18-.Returns at 1 week, has been in 4 layer compression on the left, using alginate for the left leg and Prisma on the left foot READMISSION 6/25 He returns to clinic today with a 3 week history of an opening on the left anterior mid tibia area. He has been applying silver collagen to this. When he left our clinic we ordered him 30-40 over the toe stockings. He states his edema is under as good control in this leg as he is ever seen it. Over the last 2 days he  has developed an area on the left medial ankle/foot. This is the area that we worked on last time they cultured staph I gave him antibiotics and this closed over. The patient has a history of pulmonary embolism with an IVC filter. 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 He is on chronic anticoagulation. 7/2; the area on the left mid tibia did not have a viable surface today somewhat surprising adherent black necrotic surface. Not even really eschared. No evidence of infection. We did silver alginate last week 7/9; left mid tibia somewhat improved surface and slightly  smaller. Using Iodoflex. 7/16 left mid tibia. Continues to [name] slightly smaller with improved surface using Iodoflex. He did not see Dr. Trula Ore with regards to the CT scan on the right leg because his insurance did not approve it 7/23; left mid tibia. No change in surface area however the wound surface looks better. He has another small area superiorly which is a small hyper granulated lesion. But this is of unclear etiology however it is defined is new this week. He also had significant bleeding reported by our intake nurse the exact source of this was unclear. 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 His original wound looks quite a bit better and how it every he is developed over the last 3 weeks a raised painful area just above the wound. I am not sure if this represents a primary cutaneous issue or a venous issue. He seems to have a palpable vein underneath this which is tender may represent superficial venous thrombosis. He is already on Xarelto. 8/6-Patient returns at 1 week for his left mid tibial wounds, the more proximal of the 2 wounds was raised painful area described last time, patient states this is less painful than before, he is almost completed his doxycycline, the wound area is larger but the wound itself is less painful. 8/13-Left anterior leg wound looks smaller and overall making good progress the other wound has healed. We are using Hydrofera Blue 8/20; the patient has 2 open areas. The original inferior area and the new area superiorly. He has been using Hydrofera Blue ready but the wounds overall look dry. He has not managed to get a CT scan approved and hence has not seen Dr. Trula Ore at [facility] 8/27 most of the patient's wound areas on the anterior look epithelialized. There is still some eschar and vulnerability here. He has been wearing to press stockings which should give him 30/40 mmHg compression.  He also has compression pumps that belonged to his father. I think it would [name] reasonable to try and get him external compression pumps. He was not using the compression pumps daily and these certainly need to [name] used daily. Finally he was approved for his CT scan on the right with follow-up with Dr. Trula Ore. We will asked Dr. Trula Ore to look at his left leg as well 9/3; the patient had 2 wound areas. Central tibial area of area is healed. Smaller wound superiorly still perhaps slightly open. I believe his CT scan on the right hip and pelvis areas on the 18th. That was ordered by Dr. Trula Ore. We have ordered him 30/40 stockings and are going to attempt to get him compression pumps 9/10-Patient returns at 1 week for the 2 small areas on his left anterior leg, these areas are healed. Patient is now going to going  to the 30/40 stockings. His CT scan of his right leg hip and pelvis areas is scheduled 9/22 the patient was discharged from the clinic on 9/10. He tells me that he started developing pain on the left anterior tibial area and swelling on Thursday of last week. By Saturday the swelling was so prominent that he was scared to take his stocking off because he would [name] able to get it back on. There was increased pain. He was seen in the emergency department at [facility] on 9/1. 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 He was also felt to have cellulitis. There was apparently not any acute clot. He I he was put on a 3 time a day medication/antibiotic which I am assuming is clindamycin. He has 2 reopened areas both on the anterior tibia 9/29; he completed the clindamycin even though it caused diarrhea. The diarrhea is resolved. The cellulitis in the left leg that was prominent last week has resolved. He also had a CT scan of the pelvis done which I think did  not show anything on the right but did show obstruction of theo Common iliac vein on the left [he says in close proximity to the IVC]. I looked in [facility] link I do not see the actual CT scan report. He is being apparently scheduled for an attempt at stenting retrograde and then if that does not work anterior grade by Dr. Trula Ore. We use silver alginate to the wound last week because of coexistent cellulitis 10/6; cellulitis on the left leg resolved. He is still waiting for insurance authorization for his pelvic vein stenting. Changed him to [facility] last week 10/13; cellulitis remains resolved. Wound is smaller. Using Hydrofera Blue under compression. In 2 days time he is going for his venous stenting hopefully by Dr. Trula Ore at [facility] 10/20; the patient went for his procedure last Thursday by Dr. Trula Ore although he does not remember in the postop period what he was told. He is not certain whether Dr. Trula Ore managed to get the stent then successfully. His wound is measuring smaller looks healthy on the left mid tibia. He is also having some discomfort behind his left knee that he had me look at which is the site where Dr. Trula Ore had venous access. I looked in care everywhere. I could not see a specific note from Dr. Trula Ore [[facility]] above the actual procedure. Presumably it is still in transfer therefore I could not really answer his question about whether the stent was placed successful 10/27; wound not quite as good as last week. Using Hydrofera Blue. He thinks it might of stuck to the wound and was adherent when they took it off today. His procedure with Dr. Trula Ore was not successful. He thinks that Dr. Trula Ore will try to go at this from the other direction at some point. He sees him in 2- 1/2-week 11/3; quite an improvement in wound surface area this week. We are using Hydrofera Blue. No need to change the dressing. Follows up with Dr. Trula Ore in about a week and a half 11/10; wound  surface not as good this week at all. No changes in dimensions. We have been using Hydrofera Blue. As well he had tells [place] he had discomfort in his foot all week although he admits he was up on this more than usual. He comes in today with 2 small punched  out areas on the left lateral foot. He has severe venous hypertension. He sees Dr. Trula Ore again on 11/16 11/17; not much change in the area on the anterior tibia mid aspect. He also has 2 areas on the left lateral foot. We have been using Sorbact on the left anterior and Iodosorb ointment on the left foot. He is under 4-layer compression. The patient went to see Dr. Trula Ore I do not yet have access to this note and care everywhere. However according to the patient there is an option to try and address his venous occlusion I think via a transjugular approach. This is complicated by the fact that the IVC filter is there and would have to [name] traversed. According to the patient there is about a 33% chance of perforation may [name] even aortic perforation. However they would [name] prepared for this. He is thinking about this and discussing it with his wife Thanks to our case manager this week we are able to determine if for some reason the patient is not using his compression pumps. He also is not able to get stockings on the right leg which are 30/40 below-knee. I think we may need to order external compression garments 12/1; patient arrives with the area on the left anterior mid tibia expanded superiorly small rim of skin between the original area and the new area. He has painful small denuded areas on the left medial and left lateral heel. We have been using Iodoflex. His next appointment with the Dr. Trula Ore is January 4. after the holidays to discuss if he would like to pursue this endovascular option. Dr. Sherrin Daisy notes below from last visit Subjective [name] is a 44 y.o. 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 Unfortunately during the procedure a stent was not able to [name] placed d/t narrowing at the IVC. Per the patient the procedure provided 2-3 days of symptomatic relief (decrease in L LE pressure and pain) but after that his symptoms returned with increased pressure, pain in the hips and pain in the legs when standing. The patient does report his L LE wound is stable especially when he is consistently attending his wound care clinic in [place]. The patient denies using compressive LE therapies unless done at his wound clinic due to not being able to reach his legs. He is interested in learning more and possibly pursing further interventional options for his L lower extremity via access through neck. *Objective Physical Exam: BP 171/86  Pulse 80  T emp 37 C (T emporal)  Wt (!) 147.4 kg (325 lb)  BMI 41.73 kg/m General: awake, alert and oriented x 3 Chest: Non labored breathing on room air CVS: Normal rate. Regular rhythm. ABD: soft, NT ND, Ext: bilateral leg edema w/ left leg in compressive dressing from wound clinic. Evidence of skin discoloration on both feet bilaterally. Data Review: Labs: None to review Imaging:Reviewed report of 10/15 L LE venogram. I saw and evaluated the patient, participating in the key portions of the service. I reviewed the residentoos note. I agree with the residentoos findings and plan. Electronically signed by Derry Skill, MD at 09/16/2019 10:53 AM EST 12/11 the areas around his ankles have healed. 2 areas in the center part of the mid tibia area are still  open we have been using silver collagen. Procedure attempt by Dr. Trula Ore at [facility] on 11/03/2019 12/18; the wounds around his ankles remain closed. I think there has been some improvement in the 2 areas in  the center part of the mid tibia. Especially superiorly. Debris on the center required debridement we have been using silver collagen 12/31; I saw the patient briefly last week when he was here for a nurse visit. He had irritation in which look believed to [name] localized cellulitis and folliculitis on the lateral left calf. I gave him a prescription for cephalexin although he admits today he never filled it. The area still looked very angry. His original wounds we have been treating where a figure-of-eight shaped area on the anterior tibial area mid aspect. We have been using silver collagen these do not look healthy. 11/07/2019. The patient has completed his antibiotics. The area of folliculitis on the left lateral calf looks somewhat better although there is still erythema there is no tenderness. I am going to put some more compression on this area and will have a look at this next week. No additional antibiotics. Since the patient was last here he went to see Dr. Trula Ore at [facility]. I am able to see his note from 11/03/2019 in care everywhere. The patient has a history of a right iliac vein stent and IVC filter [name]. He is status post angioplasty of the left common iliac vein on 08/14/2019 for chronic obstruction and venous insufficiency. Unfortunately a stent could not [name] placed during the procedure due to narrowing at the IVC from fibrotic change of the right iliac vein stent. As I understand things they are now going to approach this from both sides i.e. through an area in the internal jugular vein as well as the more standard distal approach and see if this area can [name] stented. The thought was 5 to 6 weeks before this could [name] arranged as it will "require 2 surgeons, insurance approval etc. The original wound on his anterior tibia looks better. He has the area on the left lateral tibia with localized swelling. He has a new area on the left medial calcaneus which is small punched out and very painful.  I think this looks similar to wounds he has had in this area before. He has severe venous hypertension 1/15; anterior tibia wound has come down from a figure-of-eight to 1 wound. Has some depth which looks clean. The area on the left lateral calf looks better. We put additional compression in here and the edema in this area looks better. His major complaint is on the medial calcaneus. He had mentioned this last week and he has had wounds like this before. Pain is severe 1/22; anterior tibial wound seems to [name] closing at with shallower and better looking wound margin. He has an area anteriorly that I think was initially an area of folliculitis the area on the left lateral calf has closed over The small area on the medial heel that I cultured last week grew methicillin sensitive staph aureus but resistant to the doxycycline I gave him. I substituted cephalexin 500 every 6 and has only been on this for 2 days. Still says the pain is very significant 1/29; all the patient's wounds look quite good today including the anterior tibial which is closing in. The area above this also closing in finally the very painful area on the medial heel. He is completing antibiotics today this cultured methicillin sensitive staph aureus. Back to using silver  alginate on the wounds 2/12; in general things are quite good here. He still does not have a date for attempting to stand his left common iliac vein. Still an insurance issue. I have been encouraged him to move on this by calling his insurance company. There is not a good option here. 2/26; continued nice improvement. He still has the open area in the mid tibia that is incompletely closed. Surprisingly today the difficult area on his left medial calcaneus that was so painful and inflamed has epithelialized over. 3/5; the patient has his stenting procedure attempt at [facility] on 3/25. He continues to make nice progress the only remaining wound is on the left anterior  tibial area. His heel medially remains epithelialized. We have been using Sorbac 3/19; his stenting procedure is actually on 4/15 per the patient today. Unfortunately he was not here last week and did not use his pumps. He has an area of localized edema erythema and marked tenderness above where the patient claims the original wound was. T [name] truthful we all had trouble determining what was o new wound today and what was original wound. Patient thought it was the more distal medial area where is the more central area is what my nurse and I thought it was. In any case looking back on the pictures really did not clarify things. He has a small punched out area just medial to the tibia which the patient claims was his original wound superiorly he now has an open area which is superficial some satellite lesions very significant erythema and tenderness more centrally and superiorly to what is supposedly his original wound. A lot of tenderness here. We have been using Sorbact I changed to silver alginate today. He will need antibiotic 3/22; back early for review of probable cellulitis in the mid tibia area. This also could have been localized stasis dermatitis from not changing his wrap and not using compression. We put him on doxycycline empirically things look better he is in less pain no systemic illness 3/26; in for his usual appointment. The cellulitis/localized stasis dermatitis we are concerned about a week ago has resolved. He is completing the doxycycline. I really think this was localized poor edema control rather than cellulitis although these things are difficult to [name] certain. Will use silver alginate on the remaining wound which looks healthy on the left anterior mid tibia 4/9; the cellulitis stasis dermatitis week concerned about 2 weeks ago is still resolved. His original wound in the mid tibia area is remains healed the area we are dealing with current currently is above this area by about  3 or 4 cm. Clean looking wound. No need for debridement. We have been using silver alginate The patient's attempt to recanalize his left common iliac vein is next Thursday. This is to [name] done at [facility] by Dr. Trula Ore and colleagues my understanding is that they will approach this from both sides 4/30; the patient underwent an extensive venous revascularization by Dr. Trula Ore at [facility] on 02/12/2020 this included recanalization of the inferior vena cava, endovascular stenting of the inferior vena cava of the left common iliac vein the left iliac vein and the left femoral vein and venoplasty of the inferior vena cava left iliac vein common femoral vein right iliac vein and right femoral vein. The patient did well. He is now on a months worth of Lovenox and lieu of his Xarelto. He is not on any other antiplatelet agents. We have been wrapping his leg with 4-layer  compression. According to our nurses he had a nurse visit last week and was closed he remains closed but is concerned about some degree of swelling in the left leg. He is not in any pain but says he is hypersensitive 5/7; the patient is status post an extensive venous revascularization by Dr. Trula Ore at [facility]. This included recanalization of the inferior vena cava, endovascular stenting of the left common iliac vein left iliac vein and left femoral vein. The patient has done very well. His wounds are all closed. He has not been able to get into his 30/40 below-knee stockings however he has extremitease stockings at home and I have suggested these. He also has compression pumps and I have asked him to consider using these once a day and monitor the swelling in his lower extremity Readmission: 05/28/2020 on evaluation today patient presents for readmission here in the clinic concerning issues that he has been having currently with a reopening of his wound over the past 1-2 [place] and tells me that he was not wearing his compression  regularly like he is supposed to. He states that subsequently his leg began to swell and then subsequently following this he had wounds that open. Fortunately there is no signs of active infection at this time which is good news. He does tell me it appeared to [name] somewhat infected at one point but he thinks it was just fluid and drainage and not true active infection. Nonetheless he does have overall worsening with regard to the open wound at this time in the past he is done well with Hydrofera Blue than when he had less drainage we had switched to Sorbact. 06/04/2020 upon evaluation today patient presents for follow-up concerning his leg ulcer. He has been tolerating the dressing changes without complication. Fortunately there is no signs of active infection at this time which is great news. No fevers, chills, nausea, vomiting, or diarrhea. 06/10/20-Returns at 1 week under 4 layer compression, Hydrofera blue, with 1 open area closed up and the remaining on left anterior leg smaller 8/19; I have not seen this patient since he returned. Is a patient with severe chronic venous insufficiency with central venous obstruction status post left common iliac vein stenting. This certainly helped and healed his wounds on the anterior leg and medial ankle the last time. He returned to clinic saying that [place] had [place] the beach. He is compliant with his juxta lite stocking but was more active on his feet developed increasing edema and an opening on the left anterior mid tibia area. He is back in compression using Hydrofera Blue. There is a threatened area just above the wound laterally today. Other than that the wound itself does not look too bad 8/26; the patient's major wound in the mid tibial area is better. He had a blister last week that we dressed with alginate just above this area that is open this week. Finally he has an area on the medial ankle which he says was a scab that was come off. Left a pinpoint  wound. He has I think very significant venous hypertension in the medial ankle area with very dilated small venules. This is been a very problematic area for him in the past Objective Constitutional Patient is hypertensive.. Pulse regular and within target range for patient.Marland Kitchen Respirations regular, non-labored and within target range.. Temperature is normal and within the target range for the patient.Marland Kitchen Appears in no distress. Vitals Time Taken: 8:00 AM,  Height: 74 in, Weight: 335 lbs, BMI: 43, Temperature: 98.4 F, Pulse: 97 bpm, Respiratory Rate: 17 breaths/min, Blood Pressure: 150/79 mmHg. Cardiovascular Pedal pulses are palpable. Generally good edema control for his severe venous hypertension. General Notes: Wound exam; left anterior leg. His original wound looks really very good come down nicely in size. The wound surface looks good to inspection. No debridement is necessary. ooHe comes in today with a small area superiorly that was a blister last week this is open but again the surface of this looks healthy. ooAlso problematically a small pinpoint wound in the medial part of his lower ankle on the left. This area has been very problematic in the past for him. I suspect he has severe venous hypertension in this area. Nevertheless the wound is very small. Integumentary (Hair, Skin) Wound #18 status is Open. Original cause of wound was Gradually Appeared. The wound is located on the Left,Anterior Lower Leg. The wound measures 1.1cm length x 0.6cm width x 0.1cm depth; 0.518cm^2 area and 0.052cm^3 volume. There is Fat Layer (Subcutaneous Tissue) exposed. There is no tunneling or undermining noted. There is a medium amount of serosanguineous drainage noted. The wound margin is distinct with the outline attached to the wound base. There is large (67-100%) red, pink granulation within the wound bed. There is no necrotic tissue within the wound bed. Wound #20 status is Open. Original cause of wound  was Blister. The wound is located on the Left,Proximal,Anterior Lower Leg. The wound measures 0.5cm length x 0.5cm width x 0.1cm depth; 0.196cm^2 area and 0.02cm^3 volume. There is Fat Layer (Subcutaneous Tissue) exposed. There is no tunneling or undermining noted. There is a small amount of serosanguineous drainage noted. The wound margin is distinct with the outline attached to the wound base. There is large (67-100%) red, pink granulation within the wound bed. There is no necrotic tissue within the wound bed. Wound #21 status is Open. Original cause of wound was Gradually Appeared. The wound is located on the Left,Medial Malleolus. The wound measures 0.2cm length x 0.2cm width x 0.1cm depth; 0.031cm^2 area and 0.003cm^3 volume. There is Fat Layer (Subcutaneous Tissue) exposed. There is no tunneling or undermining noted. There is a small amount of serosanguineous drainage noted. The wound margin is distinct with the outline attached to the wound base. There is large (67-100%) red, pink granulation within the wound bed. There is no necrotic tissue within the wound bed. Assessment Active Problems ICD-10 Lymphedema, not elsewhere classified Chronic venous hypertension (idiopathic) with inflammation of left lower extremity Non-pressure chronic ulcer of other part of left lower leg with fat layer exposed Other thrombophilia Cellulitis of left lower limb Procedures Wound #18 Pre-procedure diagnosis of Wound #18 is a Lymphedema located on the Left,Anterior Lower Leg . There was a Four Layer Compression Therapy Procedure by Zandra Abts, RN. Post procedure Diagnosis Wound #18: Same as Pre-Procedure Wound #20 Pre-procedure diagnosis of Wound #20 is a Lymphedema located on the Left,Proximal,Anterior Lower Leg . There was a Four Layer Compression Therapy Procedure by Zandra Abts, RN. Post procedure Diagnosis Wound #20: Same as Pre-Procedure Wound #21 Pre-procedure diagnosis of Wound #21 is a  Lymphedema located on the Left,Medial Malleolus . There was a Four Layer Compression Therapy Procedure by Zandra Abts, RN. Post procedure Diagnosis Wound #21: Same as Pre-Procedure Plan Follow-up Appointments: Return Appointment in 1 week. Dressing Change Frequency: Do not change entire dressing for one week. Skin Barriers/Peri-Wound Care: Moisturizing lotion TCA Cream or Ointment - mixed with lotion  Wound Cleansing: May shower with protection. - use cast protector Primary Wound Dressing: Wound #18 Left,Anterior Lower Leg: Hydrofera Blue Wound #20 Left,Proximal,Anterior Lower Leg: Hydrofera Blue Wound #21 Left,Medial Malleolus: Hydrofera Blue Secondary Dressing: ABD pad Zetuvit or Kerramax Edema Control: 4 layer compression: Left lower extremity Avoid standing for long periods of time Elevate legs to the level of the heart or above for 30 minutes daily and/or when sitting, a frequency of: - throughout the day Exercise regularly Segmental Compressive Device. - Lymphedema pumps twice a day for 1 hour each time 1. I put Hydrofera Blue on all wound areas 2. Careful attention next week to the medial ankle. This is been very problematic in the past I suspect he has severe venous hypertension in the small venules in this area. 3. We have good edema control no evidence of Electronic Signature(s) Signed: 06/24/2020 6:08:30 PM By: Baltazar Najjar MD Entered By: Baltazar Najjar on 06/24/2020 08:49:13 -------------------------------------------------------------------------------- SuperBill Details Patient Name: Date of Service: [name], [name] 06/24/2020 Medical Record Number: [id number] Patient Account Number: [id number] Date of Birth/Sex: Treating RN: Jul 07, 1976 (44 y.o. [name] Primary Care Provider: [name], [name] Other Clinician: Referring Provider: Treating Provider/Extender: Carola Rhine, [name] in Treatment: 3 Diagnosis Coding ICD-10  Codes Code Description I89.0 Lymphedema, not elsewhere classified I87.322 Chronic venous hypertension (idiopathic) with inflammation of left lower extremity L97.822 Non-pressure chronic ulcer of other part of left lower leg with fat layer exposed D68.69 Other thrombophilia L03.116 Cellulitis of left lower limb Facility Procedures CPT4 Code: [id number] Description: (Facility Use Only) 29581LT - APPLY MULTLAY COMPRS LWR LT LEG Modifier: Quantity: 1 Physician Procedures : CPT4 Code Description Modifier [id number] - WC PHYS LEVEL 3 - EST PT ICD-10 Diagnosis Description I87.322 Chronic venous hypertension (idiopathic) with inflammation of left lower extremity I89.0 Lymphedema, not elsewhere classified L97.822  Non-pressure chronic ulcer of other part of left lower leg with fat layer exposed D68.69 Other thrombophilia Quantity: 1 Electronic Signature(s) Signed: 06/24/2020 4:47:33 PM By: Zandra Abts RN, BSN Signed: 06/24/2020 6:08:30 PM By: Baltazar Najjar MD Entered By: Zandra Abts on 06/24/2020 09:22:25

## 2020-06-26 NOTE — Progress Notes (Signed)
[name] ([id number]) Visit Report for 06/24/2020 Arrival Information Details Patient Name: Date of Service: [name], [name] [name] 06/24/2020 8:00 A M Medical Record Number: [id number] Patient Account Number: [id number] Date of Birth/Sex: Treating RN: 10/22/1976 (44 y.o. [name] Primary Care [name]: [name], [name] Other Clinician: Referring [name]: Treating [name]/Extender: [name], [name] in Treatment: 3 Visit Information History Since Last Visit Added or deleted any medications: No Patient Arrived: Ambulatory Any new allergies or adverse reactions: No Arrival Time: 08:00 Had a fall or experienced change in No Accompanied By: self activities of daily living that may affect Transfer Assistance: None risk of falls: Patient Identification Verified: Yes Signs or symptoms of abuse/neglect since last visito No Secondary Verification Process Completed: Yes Hospitalized since last visit: No Patient Has Alerts: Yes Implantable device outside of the clinic excluding No Patient Alerts: Patient on Blood Thinner cellular tissue based products placed in the center since last visit: Has Dressing in Place as Prescribed: Yes Has Compression in Place as Prescribed: Yes Pain Present Now: No Electronic Signature(s) Signed: 06/25/2020 5:56:33 PM By: [name] Entered By: [name] on 06/24/2020 08:00:34 -------------------------------------------------------------------------------- Compression Therapy Details Patient Name: Date of Service: [name] Hough, MA [name] 06/24/2020 8:00 A M Medical Record Number: [id number] Patient Account Number: [id number] Date of Birth/Sex: Treating RN: [birth year] (44 y.o. [name] Primary Care [name]: [name], [name] Other Clinician: Referring [name]: Treating [name]/Extender: [name], [name] in Treatment: 3 Compression Therapy Performed for Wound Assessment: Wound #18 Left,Anterior  Lower Leg Performed By: Clinician [name] Abts, RN Compression Type: Four Layer Post Procedure Diagnosis Same as Pre-procedure Electronic Signature(s) Signed: 06/24/2020 4:47:33 PM By: [name] [name] Entered By: [name] on 06/24/2020 08:31:07 -------------------------------------------------------------------------------- Compression Therapy Details Patient Name: Date of Service: [name] Hough, MA [name] 06/24/2020 8:00 A M Medical Record Number: [id number] Patient Account Number: [id number] Date of Birth/Sex: Treating RN: 17-May-1976 (44 y.o. [name] Primary Care [name]: [name], [name] Other Clinician: Referring Dillyn Joaquin: Treating [name]/Extender: [name], [name] in Treatment: 3 Compression Therapy Performed for Wound Assessment: Wound #20 Left,Proximal,Anterior Lower Leg Performed By: Clinician [name] Abts, RN Compression Type: Four Layer Post Procedure Diagnosis Same as Pre-procedure Electronic Signature(s) Signed: 06/24/2020 4:47:33 PM By: [name] [name] Entered By: [name] on 06/24/2020 08:31:07 -------------------------------------------------------------------------------- Compression Therapy Details Patient Name: Date of Service: [name] Hough, MA [name] 06/24/2020 8:00 A M Medical Record Number: [id number] Patient Account Number: [id number] Date of Birth/Sex: Treating RN: 12-02-1975 (44 y.o. [name] Primary Care Citlali Gautney: [name], [name] Other Clinician: Referring [name]: Treating [name]/Extender: [name], [name] in Treatment: 3 Compression Therapy Performed for Wound Assessment: Wound #21 Left,Medial Malleolus Performed By: Clinician [name] Abts, RN Compression Type: Four Layer Post Procedure Diagnosis Same as Pre-procedure Electronic Signature(s) Signed: 06/24/2020 4:47:33 PM By: [name] [name] Entered By: [name] on 06/24/2020  08:31:07 -------------------------------------------------------------------------------- Encounter Discharge Information Details Patient Name: Date of Service: [name] Hough, MA [name] 06/24/2020 8:00 A M Medical Record Number: [id number] Patient Account Number: [id number] Date of Birth/Sex: Treating RN: 22-May-1976 (44 y.o. [name] Primary Care [name]: [name], [name] Other Clinician: Referring [name]: Treating [name]/Extender: [name], [name] in Treatment: 3 Encounter Discharge Information Items Discharge Condition: Stable Ambulatory Status: Ambulatory Discharge Destination: Home Transportation: Private Auto Accompanied By: self Schedule Follow-up Appointment: Yes Clinical Summary of Care: Patient Declined Electronic Signature(s) Signed: 06/25/2020 5:56:33 PM By: [name] Entered By: [name] on 06/24/2020 08:58:39 -------------------------------------------------------------------------------- Lower  Extremity Assessment Details Patient Name: Date of Service: [name] [place] [name] 06/24/2020 8:00 A M Medical Record Number: [id number] Patient Account Number: [id number] Date of Birth/Sex: Treating RN: 1976/09/03 (44 y.o. [name] RightM) [name] Primary Care Dyami Umbach: [name] ChuckKoirala, [name] Other Clinician: Referring Jony Ladnier: Treating [name]/Extender: [name] Rhineobson, [name], [name] in Treatment: 3 Edema Assessment Assessed: [Left: No] [[name]: No] E[Left: dema] [[name]: :] Calf Left: [name]: Point of Measurement: 45 cm From Medial Instep 44.5 cm cm Ankle Left: [name]: Point of Measurement: 10 cm From Medial Instep 24 cm cm Vascular Assessment Pulses: Dorsalis Pedis Palpable: [Left:Yes] Electronic Signature(s) Signed: 06/25/2020 5:56:33 PM By: [name] Mylarwiggins, [name] Entered By: [name] Mylarwiggins, [name] on 06/24/2020 08:08:38 -------------------------------------------------------------------------------- Multi Wound Chart Details Patient  Name: Date of Service: [name] HoughBO BE, [place] [name] 06/24/2020 8:00 A M Medical Record Number: [id number] Patient Account Number: [id number] Date of Birth/Sex: Treating RN: 1976/09/03 (44 y.o. [name] KollerM) Lynch, Shatara Primary Care Janavia Rottman: [name] ChuckKoirala, [name] Other Clinician: Referring Nozomi Mettler: Treating Pasquale Matters/Extender: [name] Rhineobson, [name], [name] in Treatment: 3 Vital Signs Height(in): 74 Pulse(bpm): 97 Weight(lbs): 335 Blood Pressure(mmHg): 150/79 Body Mass Index(BMI): 43 Temperature(F): 98.4 Respiratory Rate(breaths/min): 17 Photos: [18:No Photos Left, Anterior Lower Leg] [20:No Photos Left, Proximal Lower Leg] [21:No Photos Left, Medial Malleolus] Wound Location: [18:Gradually Appeared] [20:Blister] [21:Gradually Appeared] Wounding Event: [18:Lymphedema] [20:Lymphedema] [21:Lymphedema] Primary Etiology: [18:Asthma, Sleep Apnea, Deep Vein] [20:Asthma, Sleep Apnea, Deep Vein] [21:Asthma, Sleep Apnea, Deep Vein] Comorbid History: [18:Thrombosis, Phlebitis, Osteoarthritis, Thrombosis, Phlebitis, Osteoarthritis, Thrombosis, Phlebitis, Osteoarthritis, Neuropathy 05/12/2020] [20:Neuropathy 06/24/2020] [21:Neuropathy 06/24/2020] Date Acquired: [18:3] [20:0] [21:0] [name] of Treatment: [18:Open] [20:Open] [21:Open] Wound Status: [18:1.1x0.6x0.1] [20:0.5x0.5x0.1] [21:0.2x0.2x0.1] Measurements L x W x D (cm) [18:0.518] [20:0.196] [21:0.031] A (cm) : rea [18:0.052] [20:0.02] [21:0.003] Volume (cm) : [18:97.10%] [20:N/A] [21:N/A] % Reduction in Area: [18:97.10%] [20:N/A] [21:N/A] % Reduction in Volume: [18:Full Thickness Without Exposed] [20:Full Thickness Without Exposed] [21:Full Thickness Without Exposed] Classification: [18:Support Structures Medium] [20:Support Structures Small] [21:Support Structures Small] Exudate Amount: [18:Serosanguineous] [20:Serosanguineous] [21:Serosanguineous] Exudate Type: [18:red, brown] [20:red, brown] [21:red, brown] Exudate Color: [18:Distinct, outline attached]  [20:Distinct, outline attached] [21:Distinct, outline attached] Wound Margin: [18:Large (67-100%)] [20:Large (67-100%)] [21:Large (67-100%)] Granulation Amount: [18:Red, Pink] [20:Red, Pink] [21:Red, Pink] Granulation Quality: [18:None Present (0%)] [20:None Present (0%)] [21:None Present (0%)] Necrotic Amount: [18:Fat Layer (Subcutaneous Tissue): Yes Fat Layer (Subcutaneous Tissue): Yes Fat Layer (Subcutaneous Tissue): Yes] Exposed Structures: [18:Fascia: No Tendon: No Muscle: No Joint: No Bone: No Large (67-100%)] [20:Fascia: No Tendon: No Muscle: No Joint: No Bone: No None] [21:Fascia: No Tendon: No Muscle: No Joint: No Bone: No None] Epithelialization: [18:Compression Therapy] [20:Compression Therapy] [21:Compression Therapy] Treatment Notes Electronic Signature(s) Signed: 06/24/2020 4:47:33 PM By: [name] AbtsLynch, Shatara [name] Signed: 06/24/2020 6:08:30 PM By: Baltazar Najjarobson, Michael MD Entered By: Baltazar Najjarobson, [name] on 06/24/2020 08:43:48 -------------------------------------------------------------------------------- Multi-Disciplinary Care Plan Details Patient Name: Date of Service: [name] HoughBO BE, [place] [name] 06/24/2020 8:00 A M Medical Record Number: [id number] Patient Account Number: [id number] Date of Birth/Sex: Treating RN: 1976/09/03 (44 y.o. [name] KollerM) Lynch, Shatara Primary Care Itzabella Sorrels: [name] ChuckKoirala, [name] Other Clinician: Referring Daziyah Cogan: Treating [name] [name]/Extender: [name] Rhineobson, [name], [name] in Treatment: 3 Active Inactive Venous Leg Ulcer Nursing Diagnoses: Actual venous Insuffiency (use after diagnosis is confirmed) Goals: Patient will maintain optimal edema control Date Initiated: 05/28/2020 Target Resolution Date: 07/02/2020 Goal Status: Active Interventions: Compression as ordered Notes: Wound/Skin Impairment Nursing Diagnoses: Impaired tissue integrity Goals: Ulcer/skin breakdown will have a volume reduction of 30% by week 4 Date Initiated: 05/28/2020 Target Resolution Date:  07/02/2020 Goal Status: Active Interventions: Provide education on ulcer and skin  care Notes: Electronic Signature(s) Signed: 06/24/2020 4:47:33 PM By: [name] [name] Entered By: [name] on 06/24/2020 07:57:43 -------------------------------------------------------------------------------- Pain Assessment Details Patient Name: Date of Service: [name] BE, MA [name] 06/24/2020 8:00 A M Medical Record Number: [id number] Patient Account Number: [id number] Date of Birth/Sex: Treating RN: 05/22/1976 (44 y.o. [name] Primary Care Xavier Fournier: [name], [name] Other Clinician: Referring Gavino Fouch: Treating Chason Mciver/Extender: [name], [name] in Treatment: 3 Active Problems Location of Pain Severity and Description of Pain Patient Has Paino No Site Locations Pain Management and Medication Current Pain Management: Electronic Signature(s) Signed: 06/25/2020 5:56:33 PM By: [name] Entered By: [name] on 06/24/2020 08:01:05 -------------------------------------------------------------------------------- Patient/Caregiver Education Details Patient Name: Date of Service: [name] Hough, MA [name] 8/26/2021andnbsp8:00 A M Medical Record Number: [id number] Patient Account Number: [id number] Date of Birth/Gender: Treating RN: 01/12/1976 (44 y.o. [name] Primary Care Physician: [name], [name] Other Clinician: Referring Physician: Treating Physician/Extender: [name], [name] in Treatment: 3 Education Assessment Education Provided To: Patient Education Topics Provided Wound/Skin Impairment: Methods: Explain/Verbal Responses: State content correctly Electronic Signature(s) Signed: 06/24/2020 4:47:33 PM By: [name] [name] Entered By: [name] on 06/24/2020 07:57:51 -------------------------------------------------------------------------------- Wound Assessment Details Patient Name: Date of  Service: [name] Hough, MA [name] 06/24/2020 8:00 A M Medical Record Number: [id number] Patient Account Number: [id number] Date of Birth/Sex: Treating RN: 09/11/1976 (44 y.o. [name] Primary Care Zaccheus Edmister: [name], [name] Other Clinician: Referring Kila Godina: Treating Nicko Daher/Extender: [name], [name] in Treatment: 3 Wound Status Wound Number: 18 Primary Lymphedema Etiology: Wound Location: Left, Anterior Lower Leg Wound Open Wounding Event: Gradually Appeared Status: Date Acquired: 05/12/2020 Comorbid Asthma, Sleep Apnea, Deep Vein Thrombosis, Phlebitis, [name] Of Treatment: 3 History: Osteoarthritis, Neuropathy Clustered Wound: No Photos Photo Uploaded By: Benjaman Kindler on 06/25/2020 14:30:48 Wound Measurements Length: (cm) 1.1 Width: (cm) 0.6 Depth: (cm) 0.1 Area: (cm) 0.518 Volume: (cm) 0.052 % Reduction in Area: 97.1% % Reduction in Volume: 97.1% Epithelialization: Large (67-100%) Tunneling: No Undermining: No Wound Description Classification: Full Thickness Without Exposed Support Structures Wound Margin: Distinct, outline attached Exudate Amount: Medium Exudate Type: Serosanguineous Exudate Color: red, brown Foul Odor After Cleansing: No Slough/Fibrino No Wound Bed Granulation Amount: Large (67-100%) Exposed Structure Granulation Quality: Red, Pink Fascia Exposed: No Necrotic Amount: None Present (0%) Fat Layer (Subcutaneous Tissue) Exposed: Yes Tendon Exposed: No Muscle Exposed: No Joint Exposed: No Bone Exposed: No Treatment Notes Wound #18 (Left, Anterior Lower Leg) 1. Cleanse With Wound Cleanser Soap and water 2. Periwound Care Moisturizing lotion 3. Primary Dressing Applied Hydrofera Blue 4. Secondary Dressing ABD Pad Dry Gauze 6. Support Layer Applied 4 layer compression wrap Electronic Signature(s) Signed: 06/25/2020 5:56:33 PM By: [name] Entered By: [name] on 06/24/2020  08:11:08 -------------------------------------------------------------------------------- Wound Assessment Details Patient Name: Date of Service: [name] Hough, MA [name] 06/24/2020 8:00 A M Medical Record Number: [id number] Patient Account Number: [id number] Date of Birth/Sex: Treating RN: 1976/10/03 (44 y.o. [name] Primary Care Zeenat Jeanbaptiste: [name], [name] Other Clinician: Referring Keyontay Stolz: Treating Latosha Gaylord/Extender: [name], [name] in Treatment: 3 Wound Status Wound Number: 20 Primary Lymphedema Etiology: Wound Location: Left, Proximal Lower Leg Wound Open Wounding Event: Blister Status: Date Acquired: 06/24/2020 Comorbid Asthma, Sleep Apnea, Deep Vein Thrombosis, Phlebitis, [name] Of Treatment: 0 History: Osteoarthritis, Neuropathy Clustered Wound: No Photos Photo Uploaded By: Benjaman Kindler on 06/25/2020 14:30:49 Wound Measurements Length: (cm) 0.5 Width: (cm) 0.5 Depth: (cm) 0.1 Area: (cm) 0.196 Volume: (cm) 0.02 % Reduction in Area: % Reduction  in Volume: Epithelialization: None Tunneling: No Undermining: No Wound Description Classification: Full Thickness Without Exposed Support Structures Wound Margin: Distinct, outline attached Exudate Amount: Small Exudate Type: Serosanguineous Exudate Color: red, brown Foul Odor After Cleansing: No Slough/Fibrino No Wound Bed Granulation Amount: Large (67-100%) Exposed Structure Granulation Quality: Red, Pink Fascia Exposed: No Necrotic Amount: None Present (0%) Fat Layer (Subcutaneous Tissue) Exposed: Yes Tendon Exposed: No Muscle Exposed: No Joint Exposed: No Bone Exposed: No Electronic Signature(s) Signed: 06/25/2020 5:56:33 PM By: [name] Entered By: [name] on 06/24/2020 08:12:29 -------------------------------------------------------------------------------- Wound Assessment Details Patient Name: Date of Service: [name] Hough, MA [name] 06/24/2020 8:00 A M Medical  Record Number: [id number] Patient Account Number: [id number] Date of Birth/Sex: Treating RN: [birth year] (44 y.o. [name] Primary Care Liese Dizdarevic: [name], [name] Other Clinician: Referring Janith Nielson: Treating Lacara Dunsworth/Extender: [name], [name] in Treatment: 3 Wound Status Wound Number: 21 Primary Lymphedema Etiology: Wound Location: Left, Medial Malleolus Wound Open Wounding Event: Gradually Appeared Status: Date Acquired: 06/24/2020 Comorbid Asthma, Sleep Apnea, Deep Vein Thrombosis, Phlebitis, [name] Of Treatment: 0 History: Osteoarthritis, Neuropathy Clustered Wound: No Photos Photo Uploaded By: Benjaman Kindler on 06/25/2020 14:31:29 Wound Measurements Length: (cm) 0.2 Width: (cm) 0.2 Depth: (cm) 0.1 Area: (cm) 0.031 Volume: (cm) 0.003 % Reduction in Area: % Reduction in Volume: Epithelialization: None Tunneling: No Undermining: No Wound Description Classification: Full Thickness Without Exposed Support Structu Wound Margin: Distinct, outline attached Exudate Amount: Small Exudate Type: Serosanguineous Exudate Color: red, brown res Foul Odor After Cleansing: No Slough/Fibrino No Wound Bed Granulation Amount: Large (67-100%) Exposed Structure Granulation Quality: Red, Pink Fascia Exposed: No Necrotic Amount: None Present (0%) Fat Layer (Subcutaneous Tissue) Exposed: Yes Tendon Exposed: No Muscle Exposed: No Joint Exposed: No Bone Exposed: No Treatment Notes Wound #21 (Left, Medial Malleolus) 1. Cleanse With Wound Cleanser Soap and water 2. Periwound Care Moisturizing lotion 3. Primary Dressing Applied Hydrofera Blue 4. Secondary Dressing ABD Pad Dry Gauze 6. Support Layer Applied 4 layer compression wrap Electronic Signature(s) Signed: 06/25/2020 5:56:33 PM By: [name] Entered By: [name] on 06/24/2020 08:14:17 -------------------------------------------------------------------------------- Vitals  Details Patient Name: Date of Service: [name] Hough, MA [name] 06/24/2020 8:00 A M Medical Record Number: [id number] Patient Account Number: [id number] Date of Birth/Sex: Treating RN: 1976-10-26 (44 y.o. [name] Primary Care Shiree Altemus: [name], [name] Other Clinician: Referring Arohi Salvatierra: Treating Iness Pangilinan/Extender: [name], [name] in Treatment: 3 Vital Signs Time Taken: 08:00 Temperature (F): 98.4 Height (in): 74 Pulse (bpm): 97 Weight (lbs): 335 Respiratory Rate (breaths/min): 17 Body Mass Index (BMI): 43 Blood Pressure (mmHg): 150/79 Reference Range: 80 - 120 mg / dl Electronic Signature(s) Signed: 06/25/2020 5:56:33 PM By: [name] Entered By: [name] on 06/24/2020 08:00:55

## 2020-06-26 NOTE — Progress Notes (Signed)
[name] ([id number]) Visit Report for 06/17/2020 Arrival Information Details Patient Name: Date of Service: [name], [name] [name] 06/17/2020 7:30 A M Medical Record Number: [id number] Patient Account Number: [id number] Date of Birth/Sex: Treating RN: October 01, 1976 (44 y.o. [name] Primary Care Yong Wahlquist: [name], [name] Other Clinician: Referring Kaidon Kinker: Treating Crystalyn Delia/Extender: Carola Rhine, [name] in Treatment: 2 Visit Information History Since Last Visit Added or deleted any medications: No Patient Arrived: Ambulatory Any new allergies or adverse reactions: No Arrival Time: 07:46 Had a fall or experienced change in No Accompanied By: self activities of daily living that may affect Transfer Assistance: None risk of falls: Patient Identification Verified: Yes Signs or symptoms of abuse/neglect since last visito No Secondary Verification Process Completed: Yes Hospitalized since last visit: No Patient Has Alerts: Yes Implantable device outside of the clinic excluding No Patient Alerts: Patient on Blood Thinner cellular tissue based products placed in the center since last visit: Has Dressing in Place as Prescribed: Yes Has Compression in Place as Prescribed: Yes Pain Present Now: No Electronic Signature(s) Signed: 06/17/2020 5:22:44 PM By: [name] Entered By: [name] on 06/17/2020 07:49:00 -------------------------------------------------------------------------------- Compression Therapy Details Patient Name: Date of Service: [name] Hough, MA [name] 06/17/2020 7:30 A M Medical Record Number: [id number] Patient Account Number: [id number] Date of Birth/Sex: Treating RN: [birth year] (44 y.o. [name] Primary Care Dareth Andrew: [name], [name] Other Clinician: Referring Jo-Ann Johanning: Treating Mae Cianci/Extender: Carola Rhine, [name] in Treatment: 2 Compression Therapy Performed for Wound Assessment: Wound #18 Left,Anterior  Lower Leg Performed By: Clinician Zandra Abts, RN Compression Type: Four Layer Post Procedure Diagnosis Same as Pre-procedure Electronic Signature(s) Signed: 06/17/2020 5:36:57 PM By: Zandra Abts RN, BSN Entered By: Zandra Abts on 06/17/2020 08:38:27 -------------------------------------------------------------------------------- Encounter Discharge Information Details Patient Name: Date of Service: [name] Hough, MA [name] 06/17/2020 7:30 A M Medical Record Number: [id number] Patient Account Number: [id number] Date of Birth/Sex: Treating RN: 09/24/1976 (44 y.o. [name] Primary Care Rickiya Picariello: [name], [name] Other Clinician: Referring Tasmin Exantus: Treating Nilam Quakenbush/Extender: Carola Rhine, [name] in Treatment: 2 Encounter Discharge Information Items Discharge Condition: Stable Ambulatory Status: Ambulatory Discharge Destination: Home Transportation: Private Auto Accompanied By: self Schedule Follow-up Appointment: Yes Clinical Summary of Care: Patient Declined Electronic Signature(s) Signed: 06/25/2020 5:50:16 PM By: [name] RN Entered By: [name] on 06/17/2020 09:00:30 -------------------------------------------------------------------------------- Lower Extremity Assessment Details Patient Name: Date of Service: [name], [name] [name] 06/17/2020 7:30 A M Medical Record Number: [id number] Patient Account Number: [id number] Date of Birth/Sex: Treating RN: 05/20/1976 (44 y.o. [name] Primary Care Sesar Madewell: [name], [name] Other Clinician: Referring Saif Peter: Treating Jennie Bolar/Extender: Carola Rhine, [name] in Treatment: 2 Edema Assessment Assessed: [Left: No] [[name]: No] E[Left: dema] [[name]: :] Calf Left: [name]: Point of Measurement: 45 cm From Medial Instep 43 cm cm Ankle Left: [name]: Point of Measurement: 10 cm From Medial Instep 24.3 cm cm Vascular Assessment Pulses: Dorsalis Pedis Palpable:  [Left:Yes] Electronic Signature(s) Signed: 06/17/2020 5:22:44 PM By: [name] Entered By: [name] on 06/17/2020 07:57:26 -------------------------------------------------------------------------------- Multi Wound Chart Details Patient Name: Date of Service: [name] Hough, MA [name] 06/17/2020 7:30 A M Medical Record Number: [id number] Patient Account Number: [id number] Date of Birth/Sex: Treating RN: [birth year] (44 y.o. [name] Primary Care Kai Calico: [name], [name] Other Clinician: Referring Taliyah Watrous: Treating Tippi Mccrae/Extender: Carola Rhine, [name] in Treatment: 2 Vital Signs Height(in): 74 Pulse(bpm): 108 Weight(lbs): 335 Blood Pressure(mmHg): 168/97 Body Mass Index(BMI): 43 Temperature(F): 98.7 Respiratory Rate(breaths/min): 18 Photos: [18:No Photos Left, Anterior Lower  Leg] [N/A:N/A N/A] Wound Location: [18:Gradually Appeared] [N/A:N/A] Wounding Event: [18:Lymphedema] [N/A:N/A] Primary Etiology: [18:Asthma, Sleep Apnea, Deep Vein] [N/A:N/A] Comorbid History: [18:Thrombosis, Phlebitis, Osteoarthritis, Neuropathy 05/12/2020] [N/A:N/A] Date Acquired: [18:2] [N/A:N/A] Weeks of Treatment: [18:Open] [N/A:N/A] Wound Status: [18:4.1x1.8x0.2] [N/A:N/A] Measurements L x W x D (cm) [18:5.796] [N/A:N/A] A (cm) : rea [18:1.159] [N/A:N/A] Volume (cm) : [18:68.10%] [N/A:N/A] % Reduction in Area: [18:36.10%] [N/A:N/A] % Reduction in Volume: [18:Full Thickness Without Exposed] [N/A:N/A] Classification: [18:Support Structures Medium] [N/A:N/A] Exudate Amount: [18:Serosanguineous] [N/A:N/A] Exudate Type: [18:red, brown] [N/A:N/A] Exudate Color: [18:Distinct, outline attached] [N/A:N/A] Wound Margin: [18:Large (67-100%)] [N/A:N/A] Granulation Amount: [18:Red] [N/A:N/A] Granulation Quality: [18:Small (1-33%)] [N/A:N/A] Necrotic Amount: [18:Fat Layer (Subcutaneous Tissue): Yes N/A] Exposed Structures: [18:Fascia: No Tendon: No Muscle: No Joint: No  Bone: No None] [N/A:N/A] Epithelialization: [18:Compression Therapy] [N/A:N/A] Treatment Notes Electronic Signature(s) Signed: 06/17/2020 5:36:57 PM By: Zandra Abts RN, BSN Signed: 06/17/2020 5:57:44 PM By: Baltazar Najjar MD Entered By: Baltazar Najjar on 06/17/2020 08:39:50 -------------------------------------------------------------------------------- Multi-Disciplinary Care Plan Details Patient Name: Date of Service: [name] Hough, MA [name] 06/17/2020 7:30 A M Medical Record Number: [id number] Patient Account Number: [id number] Date of Birth/Sex: Treating RN: 1976/08/13 (44 y.o. [name] Primary Care Makenzy Krist: [name], [name] Other Clinician: Referring Eldwin Volkov: Treating Lewie Deman/Extender: Carola Rhine, [name] in Treatment: 2 Active Inactive Venous Leg Ulcer Nursing Diagnoses: Actual venous Insuffiency (use after diagnosis is confirmed) Goals: Patient will maintain optimal edema control Date Initiated: 05/28/2020 Target Resolution Date: 06/25/2020 Goal Status: Active Interventions: Compression as ordered Notes: Wound/Skin Impairment Nursing Diagnoses: Impaired tissue integrity Goals: Ulcer/skin breakdown will have a volume reduction of 30% by week 4 Date Initiated: 05/28/2020 Target Resolution Date: 06/25/2020 Goal Status: Active Interventions: Provide education on ulcer and skin care Notes: Electronic Signature(s) Signed: 06/17/2020 5:36:57 PM By: Zandra Abts RN, BSN Entered By: Zandra Abts on 06/17/2020 08:37:15 -------------------------------------------------------------------------------- Pain Assessment Details Patient Name: Date of Service: [name] Hough, MA [name] 06/17/2020 7:30 A M Medical Record Number: [id number] Patient Account Number: [id number] Date of Birth/Sex: Treating RN: [birth year] (44 y.o. [name] Primary Care Makeya Hilgert: [name], [name] Other Clinician: Referring Samer Dutton: Treating Maegen Wigle/Extender: Carola Rhine, [name] in Treatment: 2 Active Problems Location of Pain Severity and Description of Pain Patient Has Paino No Site Locations Pain Management and Medication Current Pain Management: Electronic Signature(s) Signed: 06/17/2020 5:22:44 PM By: [name] Entered By: [name] on 06/17/2020 07:57:12 -------------------------------------------------------------------------------- Patient/Caregiver Education Details Patient Name: Date of Service: [name] Hough, MA [name] 8/19/2021andnbsp7:30 A M Medical Record Number: [id number] Patient Account Number: [id number] Date of Birth/Gender: Treating RN: [birth year] (44 y.o. [name] Primary Care Physician: [name], [name] Other Clinician: Referring Physician: Treating Physician/Extender: Carola Rhine, [name] in Treatment: 2 Education Assessment Education Provided To: Patient Education Topics Provided Wound/Skin Impairment: Methods: Explain/Verbal Responses: State content correctly Electronic Signature(s) Signed: 06/17/2020 5:36:57 PM By: Zandra Abts RN, BSN Entered By: Zandra Abts on 06/17/2020 08:37:28 -------------------------------------------------------------------------------- Wound Assessment Details Patient Name: Date of Service: [name] Hough, MA [name] 06/17/2020 7:30 A M Medical Record Number: [id number] Patient Account Number: [id number] Date of Birth/Sex: Treating RN: Feb 12, 1976 (44 y.o. [name] Primary Care Varonica Siharath: [name], [name] Other Clinician: Referring Roxanne Orner: Treating Kodie Pick/Extender: Carola Rhine, [name] in Treatment: 2 Wound Status Wound Number: [id number] Primary Lymphedema Etiology: Wound Location: Left, Anterior Lower Leg Wound Open Wounding Event: Gradually Appeared Status: Date Acquired: 05/12/2020 Comorbid Asthma, Sleep Apnea, Deep Vein Thrombosis, Phlebitis, Weeks Of Treatment: 2 History: Osteoarthritis, Neuropathy Clustered  Wound: No Photos Photo Uploaded  ByBenjaman Kindler on 06/21/2020 12:03:11 Wound Measurements Length: (cm) 4.1 Width: (cm) 1.8 Depth: (cm) 0.2 Area: (cm) 5.796 Volume: (cm) 1.159 % Reduction in Area: 68.1% % Reduction in Volume: 36.1% Epithelialization: None Tunneling: No Undermining: No Wound Description Classification: Full Thickness Without Exposed Support Structures Wound Margin: Distinct, outline attached Exudate Amount: Medium Exudate Type: Serosanguineous Exudate Color: red, brown Foul Odor After Cleansing: No Slough/Fibrino Yes Wound Bed Granulation Amount: Large (67-100%) Exposed Structure Granulation Quality: Red Fascia Exposed: No Necrotic Amount: Small (1-33%) Fat Layer (Subcutaneous Tissue) Exposed: Yes Necrotic Quality: Adherent Slough Tendon Exposed: No Muscle Exposed: No Joint Exposed: No Bone Exposed: No Electronic Signature(s) Signed: 06/17/2020 5:22:44 PM By: [name] Entered By: [name] on 06/17/2020 07:57:52 -------------------------------------------------------------------------------- Vitals Details Patient Name: Date of Service: [name] Hough, MA [name] 06/17/2020 7:30 A M Medical Record Number: [id number] Patient Account Number: [id number] Date of Birth/Sex: Treating RN: 14-Jan-1976 (44 y.o. [name] Primary Care Mieczyslaw Stamas: [name], [name] Other Clinician: Referring Phyllis Abelson: Treating Chrisandra Wiemers/Extender: Carola Rhine, [name] in Treatment: 2 Vital Signs Time Taken: 07:56 Temperature (F): 98.7 Height (in): 74 Pulse (bpm): 108 Weight (lbs): 335 Respiratory Rate (breaths/min): 18 Body Mass Index (BMI): 43 Blood Pressure (mmHg): 168/97 Reference Range: 80 - 120 mg / dl Electronic Signature(s) Signed: 06/17/2020 5:22:44 PM By: [name] Entered By: [name] on 06/17/2020 07:57:05

## 2020-07-02 ENCOUNTER — Encounter (HOSPITAL_BASED_OUTPATIENT_CLINIC_OR_DEPARTMENT_OTHER): Payer: 59 | Attending: Internal Medicine | Admitting: Internal Medicine

## 2020-07-02 DIAGNOSIS — L97822 Non-pressure chronic ulcer of other part of left lower leg with fat layer exposed: Secondary | ICD-10-CM | POA: Insufficient documentation

## 2020-07-02 DIAGNOSIS — G629 Polyneuropathy, unspecified: Secondary | ICD-10-CM | POA: Diagnosis not present

## 2020-07-02 DIAGNOSIS — J45909 Unspecified asthma, uncomplicated: Secondary | ICD-10-CM | POA: Diagnosis not present

## 2020-07-02 DIAGNOSIS — D6869 Other thrombophilia: Secondary | ICD-10-CM | POA: Insufficient documentation

## 2020-07-02 DIAGNOSIS — L03116 Cellulitis of left lower limb: Secondary | ICD-10-CM | POA: Insufficient documentation

## 2020-07-02 DIAGNOSIS — G4730 Sleep apnea, unspecified: Secondary | ICD-10-CM | POA: Diagnosis not present

## 2020-07-02 DIAGNOSIS — M1990 Unspecified osteoarthritis, unspecified site: Secondary | ICD-10-CM | POA: Diagnosis not present

## 2020-07-02 DIAGNOSIS — Z8672 Personal history of thrombophlebitis: Secondary | ICD-10-CM | POA: Diagnosis not present

## 2020-07-02 DIAGNOSIS — Z86718 Personal history of other venous thrombosis and embolism: Secondary | ICD-10-CM | POA: Insufficient documentation

## 2020-07-02 DIAGNOSIS — I87322 Chronic venous hypertension (idiopathic) with inflammation of left lower extremity: Secondary | ICD-10-CM | POA: Diagnosis not present

## 2020-07-02 DIAGNOSIS — I890 Lymphedema, not elsewhere classified: Secondary | ICD-10-CM | POA: Insufficient documentation

## 2020-07-06 NOTE — Progress Notes (Signed)
[facility], [facility] ([id number]) Visit Report for 07/02/2020 Arrival Information Details Patient Name: Date of Service: [place], [place] [name] 07/02/2020 7:30 A M Medical Record Number: [id number] Patient Account Number: [id number] Date of Birth/Sex: Treating RN: June 25, 1976 (44 y.o. [name] Primary Care Rexann Lueras: [name], [name] Other Clinician: Referring Joaopedro Eschbach: Treating Tiffini Blacksher/Extender: Carola Rhine, [name] in Treatment: 5 Visit Information History Since Last Visit Added or deleted any medications: No Patient Arrived: Ambulatory Any new allergies or adverse reactions: No Arrival Time: 07:53 Had a fall or experienced change in No Accompanied By: self activities of daily living that may affect Transfer Assistance: None risk of falls: Patient Identification Verified: Yes Signs or symptoms of abuse/neglect since last visito No Secondary Verification Process Completed: Yes Hospitalized since last visit: No Patient Has Alerts: Yes Implantable device outside of the clinic excluding No Patient Alerts: Patient on Blood Thinner cellular tissue based products placed in the center since last visit: Has Dressing in Place as Prescribed: Yes Pain Present Now: No Electronic Signature(s) Signed: 07/02/2020 9:18:57 AM By: Karl Ito Entered By: Karl Ito on 07/02/2020 07:54:29 -------------------------------------------------------------------------------- Compression Therapy Details Patient Name: Date of Service: [name], [name] 07/02/2020 7:30 A M Medical Record Number: [id number] Patient Account Number: [id number] Date of Birth/Sex: Treating RN: [birth year] (44 y.o. [name] Primary Care Brannon Decaire: [name], [name] Other Clinician: Referring Frisco Cordts: Treating Stephaine Breshears/Extender: Carola Rhine, [name] in Treatment: 5 Compression Therapy Performed for Wound Assessment: Wound #18 Left,Anterior Lower Leg Performed By: Clinician [name], RN Compression Type: Four Layer Post Procedure Diagnosis Same as Pre-procedure Electronic Signature(s) Signed: 07/02/2020 3:13:35 PM By: [name] Entered By: [name] on 07/02/2020 08:21:32 -------------------------------------------------------------------------------- Encounter Discharge Information Details Patient Name: Date of Service: [name], [name] 07/02/2020 7:30 A M Medical Record Number: [id number] Patient Account Number: [id number] Date of Birth/Sex: Treating RN: 09-25-1976 (44 y.o. [name] Primary Care Larisa Lanius: [name], [name] Other Clinician: Referring Timofey Carandang: Treating Zaevion Parke/Extender: Carola Rhine, [name] in Treatment: 5 Encounter Discharge Information Items Discharge Condition: Stable Ambulatory Status: Ambulatory Discharge Destination: Home Transportation: Private Auto Accompanied By: self Schedule Follow-up Appointment: Yes Clinical Summary of Care: Patient Declined Electronic Signature(s) Signed: 07/02/2020 2:45:56 PM By: [name] RN, BSN Entered By: [name] on 07/02/2020 08:39:29 -------------------------------------------------------------------------------- Lower Extremity Assessment Details Patient Name: Date of Service: [name], [name] 07/02/2020 7:30 A M Medical Record Number: [id number] Patient Account Number: [id number] Date of Birth/Sex: Treating RN: 24-Jan-1976 (44 y.o. [name] Primary Care Minnie Legros: [name], [name] Other Clinician: Referring Maribeth Jiles: Treating Gershom Brobeck/Extender: Carola Rhine, [name] in Treatment: 5 Edema Assessment Assessed: [Left: No] [Right: No] Edema: [Left: Ye] [Right: s] Calf Left: Right: Point of Measurement: 45 cm From Medial Instep 43.5 cm cm Ankle Left: Right: Point of Measurement: 10 cm From Medial Instep 24.7 cm cm Vascular Assessment Pulses: Dorsalis Pedis Palpable: [Left:Yes] Electronic Signature(s) Signed:  07/02/2020 2:45:56 PM By: [name] RN, BSN Entered By: [name] on 07/02/2020 08:13:55 -------------------------------------------------------------------------------- Multi Wound Chart Details Patient Name: Date of Service: [name], [name] 07/02/2020 7:30 A M Medical Record Number: [id number] Patient Account Number: [id number] Date of Birth/Sex: Treating RN: 1975-11-28 (44 y.o. [name] Primary Care Daymon Hora: [name], [name] Other Clinician: Referring Pressley Tadesse: Treating Lizbet Cirrincione/Extender: Carola Rhine, [name] in Treatment: 5 Vital Signs Height(in): 74 Pulse(bpm): 84 Weight(lbs): 335 Blood Pressure(mmHg): 133/86 Body Mass Index(BMI): 43 Temperature(F): 98.2 Respiratory Rate(breaths/min): 17 Photos: [18:No Photos Left, Anterior Lower Leg] [20:No Photos Left, Proximal,  Anterior Lower Leg] [21:No Photos Left, Medial Malleolus] Wound Location: [18:Gradually Appeared] [20:Blister] [21:Gradually Appeared] Wounding Event: [18:Lymphedema] [20:Lymphedema] [21:Lymphedema] Primary Etiology: [18:Asthma, Sleep Apnea, Deep Vein] [20:Asthma, Sleep Apnea, Deep Vein] [21:Asthma, Sleep Apnea, Deep Vein] Comorbid History: [18:Thrombosis, Phlebitis, Osteoarthritis, Thrombosis, Phlebitis, Osteoarthritis, Neuropathy 05/12/2020] [20:Neuropathy 06/24/2020] [21:Thrombosis, Phlebitis, Osteoarthritis, Neuropathy 06/24/2020] Date Acquired: [18:5] [20:1] [21:1] Weeks of Treatment: [18:Open] [20:Healed - Epithelialized] [21:Healed - Epithelialized] Wound Status: [18:0.2x0.2x0.1] [20:0x0x0] [21:0x0x0] Measurements L x W x D (cm) [18:0.031] [20:0] [21:0] A (cm) : rea [18:0.003] [20:0] [21:0] Volume (cm) : [18:99.80%] [20:100.00%] [21:100.00%] % Reduction in Area: [18:99.80%] [20:100.00%] [21:100.00%] % Reduction in Volume: [18:Full Thickness Without Exposed] [20:Full Thickness Without Exposed] [21:Full Thickness Without Exposed] Classification: [18:Support Structures Small]  [20:Support Structures None Present] [21:Support Structures None Present] Exudate Amount: [18:Serosanguineous] [20:N/A] [21:N/A] Exudate Type: [18:red, brown] [20:N/A] [21:N/A] Exudate Color: [18:Flat and Intact] [20:Indistinct, nonvisible] [21:Indistinct, nonvisible] Wound Margin: [18:Large (67-100%)] [20:None Present (0%)] [21:None Present (0%)] Granulation Amount: [18:Red, Pink] [20:N/A] [21:N/A] Granulation Quality: [18:None Present (0%)] [20:None Present (0%)] [21:None Present (0%)] Necrotic Amount: [18:Fat Layer (Subcutaneous Tissue): Yes Fascia: No] [21:Fascia: No] Exposed Structures: [18:Fascia: No Tendon: No Muscle: No Joint: No Bone: No Large (67-100%)] [20:Fat Layer (Subcutaneous Tissue): No Tendon: No Muscle: No Joint: No Bone: No Large (67-100%)] [21:Fat Layer (Subcutaneous Tissue): No Tendon: No Muscle: No Joint: No Bone: No  Large (67-100%)] Epithelialization: [18:Compression Therapy] [20:N/A] [21:N/A] Treatment Notes Wound #18 (Left, Anterior Lower Leg) 2. Periwound Care Moisturizing lotion TCA Cream 3. Primary Dressing Applied Hydrofera Blue 4. Secondary Dressing Dry Gauze 6. Support Layer Applied 4 layer compression wrap Electronic Signature(s) Signed: 07/02/2020 3:13:35 PM By: [name] Signed: 07/06/2020 7:41:19 AM By: Baltazar Najjar MD Entered By: Baltazar Najjar on 07/02/2020 08:54:09 -------------------------------------------------------------------------------- Multi-Disciplinary Care Plan Details Patient Name: Date of Service: [name], [name] 07/02/2020 7:30 A M Medical Record Number: [id number] Patient Account Number: [id number] Date of Birth/Sex: Treating RN: Aug 28, 1976 (44 y.o. [name] Primary Care Trystyn Dolley: [name], [name] Other Clinician: Referring Takeyla Million: Treating Kemia Wendel/Extender: Carola Rhine, [name] in Treatment: 5 Active Inactive Venous Leg Ulcer Nursing Diagnoses: Actual venous Insuffiency (use after  diagnosis is confirmed) Goals: Patient will maintain optimal edema control Date Initiated: 05/28/2020 Target Resolution Date: 07/30/2020 Goal Status: Active Interventions: Compression as ordered Notes: Electronic Signature(s) Signed: 07/02/2020 3:13:35 PM By: [name] Entered By: [name] on 07/02/2020 07:49:42 -------------------------------------------------------------------------------- Pain Assessment Details Patient Name: Date of Service: [name], [name] 07/02/2020 7:30 A M Medical Record Number: [id number] Patient Account Number: [id number] Date of Birth/Sex: Treating RN: 1976-10-15 (44 y.o. [name] Primary Care Millena Callins: [name], [name] Other Clinician: Referring Felice Hope: Treating Lizbeth Feijoo/Extender: Carola Rhine, [name] in Treatment: 5 Active Problems Location of Pain Severity and Description of Pain Patient Has Paino No Site Locations Pain Management and Medication Current Pain Management: Electronic Signature(s) Signed: 07/02/2020 9:18:57 AM By: Karl Ito Signed: 07/02/2020 3:13:35 PM By: [name] Entered By: Karl Ito on 07/02/2020 07:54:56 -------------------------------------------------------------------------------- Patient/Caregiver Education Details Patient Name: Date of Service: [name], [name] 9/3/2021andnbsp7:30 A M Medical Record Number: [id number] Patient Account Number: [id number] Date of Birth/Gender: Treating RN: [birth year] (44 y.o. [name] Primary Care Physician: [name], [name] Other Clinician: Referring Physician: Treating Physician/Extender: Carola Rhine, [name] in Treatment: 5 Education Assessment Education Provided To: Patient Education Topics Provided Wound/Skin Impairment: Handouts: Caring for Your Ulcer Methods: Explain/Verbal Responses: State content correctly Electronic Signature(s) Signed: 07/02/2020 3:13:35 PM By: [name] Entered By: [name]  on 07/02/2020 07:50:09 -------------------------------------------------------------------------------- Wound Assessment Details Patient Name: Date of Service: [name], [place] [name] 07/02/2020 7:30 A M Medical Record Number: [id number] Patient Account Number: [id number] Date of Birth/Sex: Treating RN: [birth year] (44 y.o. [name] Primary Care Raghav Verrilli: [name], [name] Other Clinician: Referring Prim Morace: Treating Azarya Oconnell/Extender: Carola Rhine, [name] in Treatment: 5 Wound Status Wound Number: 18 Primary Lymphedema Etiology: Wound Location: Left, Anterior Lower Leg Wound Open Wounding Event: Gradually Appeared Status: Date Acquired: 05/12/2020 Comorbid Asthma, Sleep Apnea, Deep Vein Thrombosis, Phlebitis, Weeks Of Treatment: 5 History: Osteoarthritis, Neuropathy Clustered Wound: No Wound Measurements Length: (cm) 0.2 Width: (cm) 0.2 Depth: (cm) 0.1 Area: (cm) 0.031 Volume: (cm) 0.003 % Reduction in Area: 99.8% % Reduction in Volume: 99.8% Epithelialization: Large (67-100%) Tunneling: No Undermining: No Wound Description Classification: Full Thickness Without Exposed Support Structures Wound Margin: Flat and Intact Exudate Amount: Small Exudate Type: Serosanguineous Exudate Color: red, brown Foul Odor After Cleansing: No Slough/Fibrino No Wound Bed Granulation Amount: Large (67-100%) Exposed Structure Granulation Quality: Red, Pink Fascia Exposed: No Necrotic Amount: None Present (0%) Fat Layer (Subcutaneous Tissue) Exposed: Yes Tendon Exposed: No Muscle Exposed: No Joint Exposed: No Bone Exposed: No Treatment Notes Wound #18 (Left, Anterior Lower Leg) 2. Periwound Care Moisturizing lotion TCA Cream 3. Primary Dressing Applied Hydrofera Blue 4. Secondary Dressing Dry Gauze 6. Support Layer Applied 4 layer compression Cytogeneticist) Signed: 07/02/2020 2:45:56 PM By: [name]  RN, BSN Signed: 07/02/2020 3:13:35 PM By: [name] Entered By: [name] on 07/02/2020 08:18:15 -------------------------------------------------------------------------------- Wound Assessment Details Patient Name: Date of Service: [name], [name] 07/02/2020 7:30 A M Medical Record Number: [id number] Patient Account Number: [id number] Date of Birth/Sex: Treating RN: 07-21-1976 (44 y.o. [name] Primary Care Dafney Farler: [name], [name] Other Clinician: Referring Latrena Benegas: Treating Teva Bronkema/Extender: Carola Rhine, [name] in Treatment: 5 Wound Status Wound Number: 20 Primary Lymphedema Etiology: Wound Location: Left, Proximal, Anterior Lower Leg Wound Healed - Epithelialized Wounding Event: Blister Status: Date Acquired: 06/24/2020 Comorbid Asthma, Sleep Apnea, Deep Vein Thrombosis, Phlebitis, Weeks Of Treatment: 1 History: Osteoarthritis, Neuropathy Clustered Wound: No Wound Measurements Length: (cm) Width: (cm) Depth: (cm) Area: (cm) Volume: (cm) 0 % Reduction in Area: 100% 0 % Reduction in Volume: 100% 0 Epithelialization: Large (67-100%) 0 Tunneling: No 0 Undermining: No Wound Description Classification: Full Thickness Without Exposed Support Structures Wound Margin: Indistinct, nonvisible Exudate Amount: None Present Foul Odor After Cleansing: No Slough/Fibrino No Wound Bed Granulation Amount: None Present (0%) Exposed Structure Necrotic Amount: None Present (0%) Fascia Exposed: No Fat Layer (Subcutaneous Tissue) Exposed: No Tendon Exposed: No Muscle Exposed: No Joint Exposed: No Bone Exposed: No Electronic Signature(s) Signed: 07/02/2020 2:45:56 PM By: [name] RN, BSN Entered By: [name] on 07/02/2020 08:17:01 -------------------------------------------------------------------------------- Wound Assessment Details Patient Name: Date of Service: [name], [name] 07/02/2020 7:30 A M Medical Record Number:  [id number] Patient Account Number: [id number] Date of Birth/Sex: Treating RN: 1976-04-23 (44 y.o. [name] Primary Care Endi Lagman: [name], [name] Other Clinician: Referring Marzetta Lanza: Treating Ylonda Storr/Extender: Carola Rhine, [name] in Treatment: 5 Wound Status Wound Number: 21 Primary Lymphedema Etiology: Wound Location: Left, Medial Malleolus Wound Healed - Epithelialized Wounding Event: Gradually Appeared Status: Date Acquired: 06/24/2020 Comorbid Asthma, Sleep Apnea, Deep Vein Thrombosis, Phlebitis, Weeks Of Treatment: 1 History: Osteoarthritis, Neuropathy Clustered Wound: No Wound Measurements Length: (cm) Width: (cm) Depth: (cm) Area: (cm) Volume: (cm) 0 % Reduction in Area: 100% 0 % Reduction in Volume: 100% 0 Epithelialization: Large (67-100%)  0 Tunneling: No 0 Undermining: No Wound Description Classification: Full Thickness Without Exposed Support Structures Wound Margin: Indistinct, nonvisible Exudate Amount: None Present Foul Odor After Cleansing: No Slough/Fibrino No Wound Bed Granulation Amount: None Present (0%) Exposed Structure Necrotic Amount: None Present (0%) Fascia Exposed: No Fat Layer (Subcutaneous Tissue) Exposed: No Tendon Exposed: No Muscle Exposed: No Joint Exposed: No Bone Exposed: No Electronic Signature(s) Signed: 07/02/2020 2:45:56 PM By: Zenaida DeedBoehlein, Linda RN, BSN Signed: 07/02/2020 3:13:35 PM By: Cherylin Mylarwiggins, Shannon Entered By: Zenaida DeedBoehlein, Linda on 07/02/2020 08:17:45 -------------------------------------------------------------------------------- Vitals Details Patient Name: Date of Service: Shon [place]oughBO [place], [name] 07/02/2020 7:30 A M Medical Record Number: [id number] Patient Account Number: [id number] Date of Birth/Sex: Treating RN: 1976/06/10 (44 y.o. [name] RightM) Dwiggins, Shannon Primary Care Ronrico Dupin: Docia ChuckKoirala, [name] Other Clinician: Referring Savyon Loken: Treating Tylea Hise/Extender: Carola Rhineobson, Michael Koirala, [name] in  Treatment: 5 Vital Signs Time Taken: 07:54 Temperature (F): 98.2 Height (in): 74 Pulse (bpm): 84 Weight (lbs): 335 Respiratory Rate (breaths/min): 17 Body Mass Index (BMI): 43 Blood Pressure (mmHg): 133/86 Reference Range: 80 - 120 mg / dl Electronic Signature(s) Signed: 07/02/2020 9:18:57 AM By: Karl Itoawkins, Destiny Entered By: Karl Itoawkins, Destiny on 07/02/2020 07:54:45

## 2020-07-06 NOTE — Progress Notes (Signed)
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 for folliculitis area on the left medial upper calf. This is expanded into a wound. More concerning than this he has eschar around his original wound on the left anterior tibia and several eschared areas around it which are small individually. He probably does not have great edema control. He asked to come back weekly to be rewrapped, he is concerned that his wife is not able to do this consistently in terms of the amount of compression. I agreed. Continuing with silver  collagen to the wounds 2/18; patient tells me he was in the ER at [facility] last weekend. He has been experiencing increasing pain in his right upper thigh/groin area mostly when he changes position. He apparently had a duplex ultrasound that was negative for clot but is scheduled for a CT scan to look at the upper vasculature. His wound areas on the left leg which are the ones we have been dealing with are a lot better. We have been using silver collagen 2/25; the patient has a small area on the medial left tibia which is just about closed and a smaller area on the medial left calf. We have been using silver collagen 3/3; the area on the left anterior tibia is closed and a smaller area on the medial left calf superiorly is just about fully epithelialized. We have been using silver collagen 02/03/2019 Readmission The patient called after his appointment a month ago to say that he was healed over. He went back into his 30/40 below-knee compression stockings. He states about a week or 2 after this he developed an open area in the same part of the left anterior tibial area. His stockings are about a year old. He feels they may have punched around the area that broke down. He was not putting anything over this but nor had we really instructed him to. We use silver collagen to close this out the last time under 4-layer compression. He is definitely going to need new stockings 4/13; general things look better this is on his left anterior tibia. We have been using silver collagen 4/20; using silver collagen. Dimensions are smaller. Left anterior tibia 4/27; using silver collagen. Dimensions continue to be smaller on the left anterior tibia [facility] tells me that last week there was a small scab on the medial part of the left foot. None of [place] recorded this. He states that over the course of the week he developed increasing pain in this area. He took the compression off last night expecting to see a large open wound however a small amount of tissue came out of this area to reveal a  small wound which otherwise looks somewhat benign yet he is complaining of extreme pain in this area. 5/4; using silver collagen to the major area on the left anterior tibia that continues to get smaller [facility] is developed a additional wound on the left medial foot. Undoubtedly this was probably a break in his skin that became secondarily infected. Very painful I gave him doxycycline last week he is still saying that this is painful but not quite as bad as last time 5/11; using silver collagen to the major area in the left anterior tibia ooThe area on the left medial foot culture grew methicillin sensitive staph aureus I gave him 10 days in total of doxycycline which should have covered this. 5/18-.Returns at 1 week, has been in 4 layer compression on the left, using alginate for the left leg and Prisma on the left foot READMISSION 6/25 He returns to clinic today with a 3 week history of an opening on the left anterior mid tibia area. He has been applying silver collagen to this. When he left our clinic we ordered him 30-40 over the toe stockings. He states his edema is under as good control in this leg as he is ever seen it. Over the last 2 days he has developed an area on the left medial ankle/foot. This is the area that we worked on  last time they cultured staph I gave him antibiotics and this closed over. The patient has a history of pulmonary embolism with an IVC filter. 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 He is on chronic anticoagulation. 7/2; the area on the left mid tibia did not have a viable surface today somewhat surprising adherent black necrotic surface. Not even really eschared. No evidence of infection. We did silver alginate last week 7/9; left mid tibia somewhat improved surface and slightly smaller. Using Iodoflex. 7/16 left mid tibia. Continues to be slightly smaller with improved surface using Iodoflex. He did not see Dr.  Trula Ore with regards to the CT scan on the right leg because his insurance did not approve it 7/23; left mid tibia. No change in surface area however the wound surface looks better. He has another small area superiorly which is a small hyper granulated lesion. But this is of unclear etiology however it is defined is new this week. He also had significant bleeding reported by our intake nurse the exact source of this was unclear. 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 His original wound looks quite a bit better and how it every he is developed over the last 3 weeks a raised painful area just above the wound. I am not sure if this represents a primary cutaneous issue or a venous issue. He seems to have a palpable vein underneath this which is tender may represent superficial venous thrombosis. He is already on Xarelto. 8/6-Patient returns at 1 week for his left mid tibial wounds, the more proximal of the 2 wounds was raised painful area described last time, patient states this is less painful than before, he is almost completed his doxycycline, the wound area is larger but the wound itself is less painful. 8/13-Left anterior leg wound looks smaller and overall making good progress the other wound has healed. We are using Hydrofera Blue 8/20; the patient has 2 open areas. The original inferior area and the new area superiorly. He has been using Hydrofera Blue ready but the wounds overall look dry. He has not managed to get a CT scan approved and hence has not seen Dr. Trula Ore at [facility] 8/27 most of the patient's wound areas on the anterior look epithelialized. There is still some eschar and vulnerability here. He has been wearing to press stockings which should give him 30/40 mmHg compression. He also has compression pumps that belonged to his father. I think it would be reasonable to try and get him external compression  pumps. He was not using the compression pumps daily and these certainly need to be used daily. Finally he was approved for his CT scan on the right with follow-up with Dr. Trula Ore. We will asked Dr. Trula Ore to look at his left leg as well 9/3; the patient had 2 wound areas. Central tibial area of area is healed. Smaller wound superiorly still perhaps slightly open. I believe his CT scan on the right hip and pelvis areas on the 18th. That was ordered by Dr. Trula Ore. We have ordered him 30/40 stockings and are going to attempt to get him compression pumps 9/10-Patient returns at 1 week for the 2 small areas on his left anterior leg, these areas are healed. Patient is now going to going to the 30/40 stockings. His CT scan of his right leg hip and pelvis areas is scheduled  9/22 the patient was discharged from the clinic on 9/10. He tells me that he started developing pain on the left anterior tibial area and swelling on Thursday of last week. By Saturday the swelling was so prominent that he was scared to take his stocking off because he would be able to get it back on. There was increased pain. He was seen in the emergency department at [facility] on 9/1. 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 He was also felt to have cellulitis. There was apparently not any acute clot. He I he was put on a 3 time a day medication/antibiotic which I am assuming is clindamycin. He has 2 reopened areas both on the anterior tibia 9/29; he completed the clindamycin even though it caused diarrhea. The diarrhea is resolved. The cellulitis in the left leg that was prominent last week has resolved. He also had a CT scan of the pelvis done which I think did not show anything on the right but did show obstruction of theo Common iliac vein on the left [he says in close proximity to the  IVC]. I looked in [place] link I do not see the actual CT scan report. He is being apparently scheduled for an attempt at stenting retrograde and then if that does not work anterior grade by Dr. Trula Ore. We use silver alginate to the wound last week because of coexistent cellulitis 10/6; cellulitis on the left leg resolved. He is still waiting for insurance authorization for his pelvic vein stenting. Changed him to [facility] last week 10/13; cellulitis remains resolved. Wound is smaller. Using Hydrofera Blue under compression. In 2 days time he is going for his venous stenting hopefully by Dr. Trula Ore at [facility] 10/20; the patient went for his procedure last Thursday by Dr. Trula Ore although he does not remember in the postop period what he was told. He is not certain whether Dr. Trula Ore managed to get the stent then successfully. His wound is measuring smaller looks healthy on the left mid tibia. He is also having some discomfort behind his left knee that he had me look at which is the site where Dr. Trula Ore had venous access. I looked in care everywhere. I could not see a specific note from Dr. Trula Ore [[facility]] above the actual procedure. Presumably it is still in transfer therefore I could not really answer his question about whether the stent was placed successful 10/27; wound not quite as good as last week. Using Hydrofera Blue. He thinks it might of stuck to the wound and was adherent when they took it off today. His procedure with Dr. Trula Ore was not successful. He thinks that Dr. Trula Ore will try to go at this from the other direction at some point. He sees him in 2- 1/2-week 11/3; quite an improvement in wound surface area this week. We are using Hydrofera Blue. No need to change the dressing. Follows up with Dr. Trula Ore in about a week and a half 11/10; wound surface not as good this week at all. No changes in dimensions. We have been using Hydrofera Blue. As well he had tells [place] he had  discomfort in his foot all week although he admits he was up on this more than usual. He comes in today with 2 small punched out areas on the left lateral foot. He has severe venous hypertension. He sees Dr. Trula Ore again  on 11/16 11/17; not much change in the area on the anterior tibia mid aspect. He also has 2 areas on the left lateral foot. We have been using Sorbact on the left anterior and Iodosorb ointment on the left foot. He is under 4-layer compression. The patient went to see Dr. Trula Ore I do not yet have access to this note and care everywhere. However according to the patient there is an option to try and address his venous occlusion I think via a transjugular approach. This is complicated by the fact that the IVC filter is there and would have to be traversed. According to the patient there is about a 33% chance of perforation may be even aortic perforation. However they would be prepared for this. He is thinking about this and discussing it with his wife Thanks to our case manager this week we are able to determine if for some reason the patient is not using his compression pumps. He also is not able to get stockings on the right leg which are 30/40 below-knee. I think we may need to order external compression garments 12/1; patient arrives with the area on the left anterior mid tibia expanded superiorly small rim of skin between the original area and the new area. He has painful small denuded areas on the left medial and left lateral heel. We have been using Iodoflex. His next appointment with the Dr. Trula Ore is January 4. after the holidays to discuss if he would like to pursue this endovascular option. Dr. Sherrin Daisy notes below from last visit Subjective Macalister Arnaud is a 44 y.o. 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 Unfortunately during the procedure a stent was not able to be placed d/t narrowing at the IVC. Per the patient the procedure provided 2-3 days of symptomatic relief (decrease in L LE pressure and pain) but after that his symptoms returned with increased pressure, pain in the hips and pain in the legs when standing. The patient does report his L LE wound is stable especially when he is consistently attending his wound care clinic in [place]. The patient denies using compressive LE therapies unless done at his wound clinic due to not being able to reach his legs. He is interested in learning more and possibly pursing further interventional options for his L lower extremity via access through neck. *Objective Physical Exam: BP 171/86  Pulse 80  T emp 37 C (T emporal)  Wt (!) 147.4 kg (325 lb)  BMI 41.73 kg/m General: awake, alert and oriented x 3 Chest: Non labored breathing on room air CVS: Normal rate. Regular rhythm. ABD: soft, NT ND, Ext: bilateral leg edema w/ left leg in compressive dressing from wound clinic. Evidence of skin discoloration on both feet bilaterally. Data Review: Labs: None to review Imaging:Reviewed report of 10/15 L LE venogram. I saw and evaluated the patient, participating in the key portions of the service. I reviewed the residentoos note. I agree with the residentoos findings and plan. Electronically signed by Derry Skill, MD at 09/16/2019 10:53 AM EST 12/11 the areas around his ankles have healed. 2 areas in the center part of the mid tibia area are still open we have been using silver collagen. Procedure attempt by Dr. Trula Ore at [facility] on 11/03/2019 12/18;  the wounds around his ankles remain closed. I think there has been some improvement in the 2 areas in the center part of the mid tibia. Especially superiorly. Debris on the center required debridement we have been using silver  collagen 12/31; I saw the patient briefly last week when he was here for a nurse visit. He had irritation in which look believed to be localized cellulitis and folliculitis on the lateral left calf. I gave him a prescription for cephalexin although he admits today he never filled it. The area still looked very angry. His original wounds we have been treating where a figure-of-eight shaped area on the anterior tibial area mid aspect. We have been using silver collagen these do not look healthy. 11/07/2019. The patient has completed his antibiotics. The area of folliculitis on the left lateral calf looks somewhat better although there is still erythema there is no tenderness. I am going to put some more compression on this area and will have a look at this next week. No additional antibiotics. Since the patient was last here he went to see Dr. Trula Ore at [facility]. I am able to see his note from 11/03/2019 in care everywhere. The patient has a history of a right iliac vein stent and IVC filter placement. He is status post angioplasty of the left common iliac vein on 08/14/2019 for chronic obstruction and venous insufficiency. Unfortunately a stent could not be placed during the procedure due to narrowing at the IVC from fibrotic change of the right iliac vein stent. As I understand things they are now going to approach this from both sides i.e. through an area in the internal jugular vein as well as the more standard distal approach and see if this area can be stented. The thought was 5 to 6 weeks before this could be arranged as it will "require 2 surgeons, insurance approval etc. The original wound on his anterior tibia looks better. He has the area on the left lateral tibia with localized swelling. He has a new area on the left medial calcaneus which is small punched out and very painful. I think this looks similar to wounds he has had in this area before. He has severe venous hypertension 1/15; anterior tibia  wound has come down from a figure-of-eight to 1 wound. Has some depth which looks clean. The area on the left lateral calf looks better. We put additional compression in here and the edema in this area looks better. His major complaint is on the medial calcaneus. He had mentioned this last week and he has had wounds like this before. Pain is severe 1/22; anterior tibial wound seems to be closing at with shallower and better looking wound margin. He has an area anteriorly that I think was initially an area of folliculitis the area on the left lateral calf has closed over The small area on the medial heel that I cultured last week grew methicillin sensitive staph aureus but resistant to the doxycycline I gave him. I substituted cephalexin 500 every 6 and has only been on this for 2 days. Still says the pain is very significant 1/29; all the patient's wounds look quite good today including the anterior tibial which is closing in. The area above this also closing in finally the very painful area on the medial heel. He is completing antibiotics today this cultured methicillin sensitive staph aureus. Back to using silver alginate on the wounds 2/12; in general things are quite good here. He still does not have  a date for attempting to stand his left common iliac vein. Still an insurance issue. I have been encouraged him to move on this by calling his insurance company. There is not a good option here. 2/26; continued nice improvement. He still has the open area in the mid tibia that is incompletely closed. Surprisingly today the difficult area on his left medial calcaneus that was so painful and inflamed has epithelialized over. 3/5; the patient has his stenting procedure attempt at [facility] on 3/25. He continues to make nice progress the only remaining wound is on the left anterior tibial area. His heel medially remains epithelialized. We have been using Sorbac 3/19; his stenting procedure is actually on 4/15  per the patient today. Unfortunately he was not here last week and did not use his pumps. He has an area of localized edema erythema and marked tenderness above where the patient claims the original wound was. T be truthful we all had trouble determining what was o new wound today and what was original wound. Patient thought it was the more distal medial area where is the more central area is what my nurse and I thought it was. In any case looking back on the pictures really did not clarify things. He has a small punched out area just medial to the tibia which the patient claims was his original wound superiorly he now has an open area which is superficial some satellite lesions very significant erythema and tenderness more centrally and superiorly to what is supposedly his original wound. A lot of tenderness here. We have been using Sorbact I changed to silver alginate today. He will need antibiotic 3/22; back early for review of probable cellulitis in the mid tibia area. This also could have been localized stasis dermatitis from not changing his wrap and not using compression. We put him on doxycycline empirically things look better he is in less pain no systemic illness 3/26; in for his usual appointment. The cellulitis/localized stasis dermatitis we are concerned about a week ago has resolved. He is completing the doxycycline. I really think this was localized poor edema control rather than cellulitis although these things are difficult to be certain. Will use silver alginate on the remaining wound which looks healthy on the left anterior mid tibia 4/9; the cellulitis stasis dermatitis week concerned about 2 weeks ago is still resolved. His original wound in the mid tibia area is remains healed the area we are dealing with current currently is above this area by about 3 or 4 cm. Clean looking wound. No need for debridement. We have been using silver alginate The patient's attempt to recanalize his  left common iliac vein is next Thursday. This is to be done at [facility] by Dr. Trula Ore and colleagues my understanding is that they will approach this from both sides 4/30; the patient underwent an extensive venous revascularization by Dr. Trula Ore at [facility] on 02/12/2020 this included recanalization of the inferior vena cava, endovascular stenting of the inferior vena cava of the left common iliac vein the left iliac vein and the left femoral vein and venoplasty of the inferior vena cava left iliac vein common femoral vein right iliac vein and right femoral vein. The patient did well. He is now on a months worth of Lovenox and lieu of his Xarelto. He is not on any other antiplatelet agents. We have been wrapping his leg with 4-layer compression. According to our nurses he had a nurse visit last week and was closed he remains  closed but is concerned about some degree of swelling in the left leg. He is not in any pain but says he is hypersensitive 5/7; the patient is status post an extensive venous revascularization by Dr. Trula Ore at [facility]. This included recanalization of the inferior vena cava, endovascular stenting of the left common iliac vein left iliac vein and left femoral vein. The patient has done very well. His wounds are all closed. He has not been able to get into his 30/40 below-knee stockings however he has extremitease stockings at home and I have suggested these. He also has compression pumps and I have asked him to consider using these once a day and monitor the swelling in his lower extremity Readmission: 05/28/2020 on evaluation today patient presents for readmission here in the clinic concerning issues that he has been having currently with a reopening of his wound over the past 1-2 weeks [place] the [place] and tells me that he was not wearing his compression regularly like he is supposed to. He states that subsequently his leg began to swell and then subsequently following this he had  wounds that open. Fortunately there is no signs of active infection at this time which is good news. He does tell me it appeared to be somewhat infected at one point but he thinks it was just fluid and drainage and not true active infection. Nonetheless he does have overall worsening with regard to the open wound at this time in the past he is done well with Hydrofera Blue than when he had less drainage we had switched to Sorbact. 06/04/2020 upon evaluation today patient presents for follow-up concerning his leg ulcer. He has been tolerating the dressing changes without complication. Fortunately there is no signs of active infection at this time which is great news. No fevers, chills, nausea, vomiting, or diarrhea. 06/10/20-Returns at 1 week under 4 layer compression, Hydrofera blue, with 1 open area closed up and the remaining on left anterior leg smaller 8/19; I have not seen this patient since he returned. Is a patient with severe chronic venous insufficiency with central venous obstruction status post left common iliac vein stenting. This certainly helped and healed his wounds on the anterior leg and medial ankle the last time. He returned to clinic saying that he had been at the beach. He is compliant with his juxta lite stocking but was more active on his feet developed increasing edema and an opening on the left anterior mid tibia area. He is back in compression using Hydrofera Blue. There is a threatened area just above the wound laterally today. Other than that the wound itself does not look too bad 8/26; the patient's major wound in the mid tibial area is better. He had a blister last week that we dressed with alginate just above this area that is open this week. Finally he has an area on the medial ankle which he says was a scab that was come off. Left a pinpoint wound. He has I think very significant venous hypertension in the medial ankle area with very dilated small venules. This is been a  very problematic area for him in the past 9/3; the area in the left mid tibia is just about closed. 2 small open areas remain after a vigorous debridement. Fortunately has no additional problems in the left medial ankle and foot which have been problematic in the past. Objective Constitutional Sitting or standing Blood Pressure is within target range for patient.. Pulse regular and within  target range for patient.Marland Kitchen Respirations regular, non-labored and within target range.. Temperature is normal and within the target range for the patient.Marland Kitchen Appears in no distress. Vitals Time Taken: 7:54 AM, Height: 74 in, Weight: 335 lbs, BMI: 43, Temperature: 98.2 F, Pulse: 84 bpm, Respiratory Rate: 17 breaths/min, Blood Pressure: 133/86 mmHg. Cardiovascular Pedal pulses are palpable. General Notes: Wound exam; left anterior leg. He arrived with a smattering of eschared areas. Vigorous washing off revealed 2 superficial small open areas that are not closed. He has some degree of venous stasis around this area as well in the mid part of the tibia which I think will be problematic indefinitely/forever. His edema control is good Integumentary (Hair, Skin) Wound #18 status is Open. Original cause of wound was Gradually Appeared. The wound is located on the Left,Anterior Lower Leg. The wound measures 0.2cm length x 0.2cm width x 0.1cm depth; 0.031cm^2 area and 0.003cm^3 volume. There is Fat Layer (Subcutaneous Tissue) exposed. There is no tunneling or undermining noted. There is a small amount of serosanguineous drainage noted. The wound margin is flat and intact. There is large (67-100%) red, pink granulation within the wound bed. There is no necrotic tissue within the wound bed. Wound #20 status is Healed - Epithelialized. Original cause of wound was Blister. The wound is located on the Left,Proximal,Anterior Lower Leg. The wound measures 0cm length x 0cm width x 0cm depth; 0cm^2 area and 0cm^3 volume. There is  no tunneling or undermining noted. There is a none present amount of drainage noted. The wound margin is indistinct and nonvisible. There is no granulation within the wound bed. There is no necrotic tissue within the wound bed. Wound #21 status is Healed - Epithelialized. Original cause of wound was Gradually Appeared. The wound is located on the Left,Medial Malleolus. The wound measures 0cm length x 0cm width x 0cm depth; 0cm^2 area and 0cm^3 volume. There is no tunneling or undermining noted. There is a none present amount of drainage noted. The wound margin is indistinct and nonvisible. There is no granulation within the wound bed. There is no necrotic tissue within the wound bed. Assessment Active Problems ICD-10 Lymphedema, not elsewhere classified Chronic venous hypertension (idiopathic) with inflammation of left lower extremity Non-pressure chronic ulcer of other part of left lower leg with fat layer exposed Other thrombophilia Cellulitis of left lower limb Procedures Wound #18 Pre-procedure diagnosis of Wound #18 is a Lymphedema located on the Left,Anterior Lower Leg . There was a Four Layer Compression Therapy Procedure by [name], RN. Post procedure Diagnosis Wound #18: Same as Pre-Procedure Plan Follow-up Appointments: Return Appointment in 1 week. Dressing Change Frequency: Do not change entire dressing for one week. Skin Barriers/Peri-Wound Care: Moisturizing lotion TCA Cream or Ointment - mixed with lotion Wound Cleansing: May shower with protection. - use cast protector Primary Wound Dressing: Wound #18 Left,Anterior Lower Leg: Hydrofera Blue Secondary Dressing: ABD pad Zetuvit or Kerramax Edema Control: 4 layer compression: Left lower extremity Avoid standing for long periods of time Elevate legs to the level of the heart or above for 30 minutes daily and/or when sitting, a frequency of: - throughout the day Exercise regularly Segmental Compressive  Device. - Lymphedema pumps twice a day for 1 hour each time 1. Hydrofera Blue to continue 2. I expect him to be closed by next week 3. He has 30/40 below-knee stockings. [place] the time that this [place] the [place] he was not wearing stockings or using his compression pumps. There is  good reason to believe that this was the cause of his recurrence and not failure of existing treatment. I therefore will put him back in the 30s/40s and compression pumps. 4. The rest of his leg looks quite good including his upper thigh in terms of swelling. No reason to believe he has a stent problem Electronic Signature(s) Signed: 07/06/2020 7:41:19 AM By: Baltazar Najjar MD Entered By: Baltazar Najjar on 07/02/2020 08:57:18 -------------------------------------------------------------------------------- SuperBill Details Patient Name: Date of Service: [name] Hough, [name] 07/02/2020 Medical Record Number: [id number] Patient Account Number: [id number] Date of Birth/Sex: Treating RN: Feb 01, 1976 (44 y.o. [name] Primary Care Provider: [name], [name] Other Clinician: Referring Provider: Treating Provider/Extender: Carola Rhine, [name] in Treatment: 5 Diagnosis Coding ICD-10 Codes Code Description I89.0 Lymphedema, not elsewhere classified I87.322 Chronic venous hypertension (idiopathic) with inflammation of left lower extremity L97.822 Non-pressure chronic ulcer of other part of left lower leg with fat layer exposed D68.69 Other thrombophilia L03.116 Cellulitis of left lower limb Facility Procedures CPT4 Code: 40981191 Description: (Facility Use Only) 29581LT - APPLY MULTLAY COMPRS LWR LT LEG Modifier: Quantity: 1 Physician Procedures : CPT4 Code Description Modifier [id number] - WC PHYS LEVEL 3 - EST PT ICD-10 Diagnosis Description L97.822 Non-pressure chronic ulcer of other part of left lower leg with fat layer exposed I89.0 Lymphedema, not elsewhere classified I87.322  Chronic  venous hypertension (idiopathic) with inflammation of left lower extremity Quantity: 1 Electronic Signature(s) Signed: 07/06/2020 7:41:19 AM By: Baltazar Najjar MD Entered By: Baltazar Najjar on 07/02/2020 08:57:42

## 2020-07-09 ENCOUNTER — Encounter (HOSPITAL_BASED_OUTPATIENT_CLINIC_OR_DEPARTMENT_OTHER): Payer: 59 | Admitting: Internal Medicine

## 2020-07-09 DIAGNOSIS — I890 Lymphedema, not elsewhere classified: Secondary | ICD-10-CM | POA: Diagnosis not present

## 2020-07-12 NOTE — Progress Notes (Signed)
[name] ([id number]) Visit Report for 07/09/2020 Arrival Information Details Patient Name: Date of Service: [name], [place] [name] 07/09/2020 8:00 A M Medical Record Number: [id number] Patient Account Number: [id number] Date of Birth/Sex: Treating RN: 1976-05-10 (44 y.o. [name] Primary Care Pavan Bring: [name], [name] Other Clinician: Referring Nija Koopman: Treating Ayona Yniguez/Extender: Carola Rhine, [name] in Treatment: 6 Visit Information History Since Last Visit Added or deleted any medications: No Patient Arrived: Ambulatory Any new allergies or adverse reactions: No Arrival Time: 08:05 Had a fall or experienced change in No Accompanied By: self activities of daily living that may affect Transfer Assistance: None risk of falls: Patient Identification Verified: Yes Signs or symptoms of abuse/neglect since last visito No Secondary Verification Process Completed: Yes Hospitalized since last visit: No Patient Has Alerts: Yes Implantable device outside of the clinic excluding No Patient Alerts: Patient on Blood Thinner cellular tissue based products placed in the center since last visit: Has Dressing in Place as Prescribed: Yes Pain Present Now: No Electronic Signature(s) Signed: 07/09/2020 11:28:52 AM By: Karl Ito Entered By: Karl Ito on 07/09/2020 08:06:00 -------------------------------------------------------------------------------- Clinic Level of Care Assessment Details Patient Name: Date of Service: [name] 07/09/2020 8:00 A M Medical Record Number: [id number] Patient Account Number: [id number] Date of Birth/Sex: Treating RN: 15-Mar-1976 (44 y.o. [name] Primary Care Meilyn Heindl: [name], [name] Other Clinician: Referring Yeilin Zweber: Treating Melida Northington/Extender: Carola Rhine, [name] in Treatment: 6 Clinic Level of Care Assessment Items TOOL 4 Quantity Score X- 1 0 Use when only an EandM is performed on  FOLLOW-UP visit ASSESSMENTS - Nursing Assessment / Reassessment X- 1 10 Reassessment of Co-morbidities (includes updates in patient status) X- 1 5 Reassessment of Adherence to Treatment Plan ASSESSMENTS - Wound and Skin A ssessment / Reassessment X - Simple Wound Assessment / Reassessment - one wound 1 5 []  - 0 Complex Wound Assessment / Reassessment - multiple wounds []  - 0 Dermatologic / Skin Assessment (not related to wound area) ASSESSMENTS - Focused Assessment X- 1 5 Circumferential Edema Measurements - multi extremities []  - 0 Nutritional Assessment / Counseling / Intervention []  - 0 Lower Extremity Assessment (monofilament, tuning fork, pulses) []  - 0 Peripheral Arterial Disease Assessment (using hand held doppler) ASSESSMENTS - Ostomy and/or Continence Assessment and Care []  - 0 Incontinence Assessment and Management []  - 0 Ostomy Care Assessment and Management (repouching, etc.) PROCESS - Coordination of Care X - Simple Patient / Family Education for ongoing care 1 15 []  - 0 Complex (extensive) Patient / Family Education for ongoing care X- 1 10 Staff obtains , Records, T Results / Process Orders est []  - 0 Staff telephones HHA, Nursing Homes / Clarify orders / etc []  - 0 Routine Transfer to another Facility (non-emergent condition) []  - 0 Routine Hospital Admission (non-emergent condition) []  - 0 New Admissions / / Ordering NPWT Apligraf, etc. , []  - 0 Emergency Hospital Admission (emergent condition) X- 1 10 Simple Discharge Coordination []  - 0 Complex (extensive) Discharge Coordination PROCESS - Special Needs []  - 0 Pediatric / Minor Patient Management []  - 0 Isolation Patient Management []  - 0 Hearing / Language / Visual special needs []  - 0 Assessment of Community assistance (transportation, D/C planning, etc.) []  - 0 Additional assistance / Altered mentation []  - 0 Support Surface(s) Assessment (bed, cushion,  seat, etc.) INTERVENTIONS - Wound Cleansing / Measurement X - Simple Wound Cleansing - one wound 1 5 []  - 0 Complex Wound Cleansing - multiple wounds  X- 1 5 Wound Imaging (photographs - any number of wounds) []  - 0 Wound Tracing (instead of photographs) X- 1 5 Simple Wound Measurement - one wound []  - 0 Complex Wound Measurement - multiple wounds INTERVENTIONS - Wound Dressings []  - 0 Small Wound Dressing one or multiple wounds []  - 0 Medium Wound Dressing one or multiple wounds []  - 0 Large Wound Dressing one or multiple wounds []  - 0 Application of Medications - topical []  - 0 Application of Medications - injection INTERVENTIONS - Miscellaneous []  - 0 External ear exam []  - 0 Specimen Collection (cultures, biopsies, blood, body fluids, etc.) []  - 0 Specimen(s) / Culture(s) sent or taken to Lab for analysis []  - 0 Patient Transfer (multiple staff / / Similar devices) []  - 0 Simple Staple / Suture removal (25 or less) []  - 0 Complex Staple / Suture removal (26 or more) []  - 0 Hypo / Hyperglycemic Management (close monitor of Blood Glucose) []  - 0 Ankle / Brachial Index (ABI) - do not check if billed separately X- 1 5 Vital Signs Has the patient been seen at the hospital within the last three years: Yes Total Score: 80 Level Of Care: New/Established - Level 3 Electronic Signature(s) Signed: 07/09/2020 4:40:23 PM By: Entered By: on 07/09/2020 08:47:31 -------------------------------------------------------------------------------- Encounter Discharge Information Details Patient Name: Date of Service: , MA [name] 07/09/2020 8:00 A M Medical Record Number: Patient Account Number: Date of Birth/Sex: Treating RN: 1976/01/07 (44 y.o. Nurse, adult Primary Care Manisha Cancel: , [name] Other Clinician: Referring Norvel Wenker: Treating Jakelin Taussig/Extender: , [name] in  Treatment: 6 Encounter Discharge Information Items Discharge Condition: Stable Ambulatory Status: Ambulatory Discharge Destination: Home Transportation: Private Auto Accompanied By: self Schedule Follow-up Appointment: Yes Clinical Summary of Care: Patient Declined Electronic Signature(s) Signed: 07/09/2020 4:47:07 PM By: RN, BSN Entered By: 09/08/2020 on 07/09/2020 08:42:50 -------------------------------------------------------------------------------- Lower Extremity Assessment Details Patient Name: Date of Service: [name] 07/09/2020 8:00 A M Medical Record Number: 09/08/2020 Patient Account Number: [name] Date of Birth/Sex: Treating RN: 1976/09/09 (44 y.o. [id number] Primary Care Akaiya Touchette: [id number], [name] Other Clinician: Referring Makhai Fulco: Treating Kaleb Sek/Extender: 07/10/1976, [name] in Treatment: 6 Edema Assessment Assessed: [Left: No] [Right: No] Edema: [Left: Ye] [Right: s] Calf Left: Right: Point of Measurement: 45 cm From Medial Instep 43.5 cm cm Ankle Left: Right: Point of Measurement: 10 cm From Medial Instep 24.7 cm cm Vascular Assessment Pulses: Dorsalis Pedis Palpable: [Left:Yes] Electronic Signature(s) Signed: 07/09/2020 4:40:23 PM By: [name] Entered By: [name] on 07/09/2020 08:10:46 -------------------------------------------------------------------------------- Multi Wound Chart Details Patient Name: Date of Service: 09/08/2020, MA [name] 07/09/2020 8:00 A M Medical Record Number: [name] Patient Account Number: 09/08/2020 Date of Birth/Sex: Treating RN: 05-28-1976 (44 y.o. [id number] Primary Care Jaryiah Mehlman: [id number], [name] Other Clinician: Referring Aleicia Kenagy: Treating Payten Hobin/Extender: 07/10/1976, [name] in Treatment: 6 Vital Signs Height(in): 74 Pulse(bpm): 83 Weight(lbs): 335 Blood Pressure(mmHg): 153/88 Body Mass Index(BMI):  43 Temperature(F): 98.8 Respiratory Rate(breaths/min): 17 Photos: [18:No Photos Left, Anterior Lower Leg] [N/A:N/A N/A] Wound Location: [18:Gradually Appeared] [N/A:N/A] Wounding Event: [18:Lymphedema] [N/A:N/A] Primary Etiology: [18:Asthma, Sleep Apnea, Deep Vein] [N/A:N/A] Comorbid History: [18:Thrombosis, Phlebitis, Osteoarthritis, Neuropathy 05/12/2020] [N/A:N/A] Date Acquired: [18:6] [N/A:N/A] Weeks of Treatment: [18:Healed - Epithelialized] [N/A:N/A] Wound Status: [18:0x0x0] [N/A:N/A] Measurements L x W x D (cm) [18:0] [N/A:N/A] A (cm) : rea [18:0] [N/A:N/A] Volume (cm) : [18:100.00%] [N/A:N/A] % Reduction in Area: [  18:100.00%] [N/A:N/A] % Reduction in Volume: [18:Full Thickness Without Exposed] [N/A:N/A] Classification: [18:Support Structures None Present] [N/A:N/A] Exudate Amount: [18:Flat and Intact] [N/A:N/A] Wound Margin: [18:None Present (0%)] [N/A:N/A] Granulation Amount: [18:None Present (0%)] [N/A:N/A] Necrotic Amount: [18:Fascia: No] [N/A:N/A] Exposed Structures: [18:Fat Layer (Subcutaneous Tissue): No Tendon: No Muscle: No Joint: No Bone: No Large (67-100%)] [N/A:N/A] Treatment Notes Electronic Signature(s) Signed: 07/09/2020 4:40:23 PM By: [name] Signed: 07/12/2020 7:26:40 PM By: Baltazar Najjar MD Entered By: Baltazar Najjar on 07/09/2020 08:51:02 -------------------------------------------------------------------------------- Multi-Disciplinary Care Plan Details Patient Name: Date of Service: [name] Hough, MA [name] 07/09/2020 8:00 A M Medical Record Number: [id number] Patient Account Number: [id number] Date of Birth/Sex: Treating RN: 30-Oct-1976 (44 y.o. [name] Primary Care Adalena Abdulla: [name], [name] Other Clinician: Referring Willis Holquin: Treating Alayja Armas/Extender: Carola Rhine, [name] in Treatment: 6 Active Inactive Electronic Signature(s) Signed: 07/09/2020 4:40:23 PM By: [name] Entered By: [name]  on 07/09/2020 08:47:06 -------------------------------------------------------------------------------- Pain Assessment Details Patient Name: Date of Service: [name] Hough, MA [name] 07/09/2020 8:00 A M Medical Record Number: [id number] Patient Account Number: [id number] Date of Birth/Sex: Treating RN: 07-May-1976 (44 y.o. [name] Primary Care Akai Dollard: [name], [name] Other Clinician: Referring Trenita Hulme: Treating Odelle Kosier/Extender: Carola Rhine, [name] in Treatment: 6 Active Problems Location of Pain Severity and Description of Pain Patient Has Paino No Site Locations Pain Management and Medication Current Pain Management: Electronic Signature(s) Signed: 07/09/2020 11:28:52 AM By: Karl Ito Signed: 07/09/2020 4:40:23 PM By: [name] Entered By: Karl Ito on 07/09/2020 08:06:25 -------------------------------------------------------------------------------- Wound Assessment Details Patient Name: Date of Service: [name] 07/09/2020 8:00 A M Medical Record Number: [id number] Patient Account Number: [id number] Date of Birth/Sex: Treating RN: [birth year] (44 y.o. [name] Primary Care Versia Mignogna: [name], [name] Other Clinician: Referring Keziyah Kneale: Treating Deitrick Ferreri/Extender: Carola Rhine, [name] in Treatment: 6 Wound Status Wound Number: 18 Primary Lymphedema Etiology: Wound Location: Left, Anterior Lower Leg Wound Healed - Epithelialized Wounding Event: Gradually Appeared Status: Date Acquired: 05/12/2020 Comorbid Asthma, Sleep Apnea, Deep Vein Thrombosis, Phlebitis, Weeks Of Treatment: 6 History: Osteoarthritis, Neuropathy Clustered Wound: No Wound Measurements Length: (cm) Width: (cm) Depth: (cm) Area: (cm) Volume: (cm) 0 % Reduction in Area: 100% 0 % Reduction in Volume: 100% 0 Epithelialization: Large (67-100%) 0 Tunneling: No 0 Undermining: No Wound Description Classification: Full  Thickness Without Exposed Support Structures Wound Margin: Flat and Intact Exudate Amount: None Present Foul Odor After Cleansing: No Slough/Fibrino No Wound Bed Granulation Amount: None Present (0%) Exposed Structure Necrotic Amount: None Present (0%) Fascia Exposed: No Fat Layer (Subcutaneous Tissue) Exposed: No Tendon Exposed: No Muscle Exposed: No Joint Exposed: No Bone Exposed: No Electronic Signature(s) Signed: 07/09/2020 4:40:23 PM By: [name] Entered By: [name] on 07/09/2020 08:35:17 -------------------------------------------------------------------------------- Vitals Details Patient Name: Date of Service: [name] Hough, MA [name] 07/09/2020 8:00 A M Medical Record Number: [id number] Patient Account Number: [id number] Date of Birth/Sex: Treating RN: 1976-06-15 (44 y.o. [name] Primary Care Varsha Knock: [name], [name] Other Clinician: Referring Naida Escalante: Treating Tauren Delbuono/Extender: Carola Rhine, [name] in Treatment: 6 Vital Signs Time Taken: 08:06 Temperature (F): 98.8 Height (in): 74 Pulse (bpm): 83 Weight (lbs): 335 Respiratory Rate (breaths/min): 17 Body Mass Index (BMI): 43 Blood Pressure (mmHg): 153/88 Reference Range: 80 - 120 mg / dl Electronic Signature(s) Signed: 07/09/2020 11:28:52 AM By: Karl Ito Signed: 07/09/2020 11:28:52 AM By: Karl Ito Entered By: Karl Ito on 07/09/2020 08:06:18

## 2020-07-12 NOTE — Progress Notes (Signed)
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 individually. He probably does not have great edema control. He asked to come back weekly to be rewrapped, he is concerned that his wife is not able to do this consistently in terms of the amount of compression. I agreed. Continuing with silver collagen to the wounds 2/18; patient tells me he was in the ER at [facility] last weekend. He has been experiencing increasing pain in  his right upper thigh/groin area mostly when he changes position. He apparently had a  duplex ultrasound that was negative for clot but is scheduled for a CT scan to look at the upper vasculature. His wound areas on the left leg which are the ones we have been dealing with are a lot better. We have been using silver collagen 2/25; the patient has a small area on the medial left tibia which is just about closed and a smaller area on the medial left calf. We have been using silver collagen 3/3; the area on the left anterior tibia is closed and a smaller area on the medial left calf superiorly is just about fully epithelialized. We have been using silver collagen 02/03/2019 Readmission The patient called after his appointment a month ago to say that he was healed over. He went back into his 30/40 below-knee compression stockings. He states about a week or 2 after this he developed an open area in the same part of the left anterior tibial area. His stockings are about a year old. He feels they may have punched around the area that broke down. He was not putting anything over this but nor had we really instructed him to. We use silver collagen to close this out the last time under 4-layer compression. He is definitely going to need new stockings 4/13; general things look better this is on his left anterior tibia. We have been using silver collagen 4/20; using silver collagen. Dimensions are smaller. Left anterior tibia 4/27; using silver collagen. Dimensions continue to be smaller on the left anterior tibia [facility] tells me that last week there was a small scab on the medial part of the left foot. None of [place] recorded this. He states that over the course of the week he developed increasing pain in this area. He took the compression off last night expecting to see a large open wound however a small amount of tissue came out of this area to reveal a small wound which otherwise looks somewhat benign yet he is complaining of extreme pain in this area. 5/4; using silver collagen to the major area  on the left anterior tibia that continues to get smaller [facility] is developed a additional wound on the left medial foot. Undoubtedly this was probably a break in his skin that became secondarily infected. Very painful I gave him doxycycline last week he is still saying that this is painful but not quite as bad as last time 5/11; using silver collagen to the major area in the left anterior tibia ooThe area on the left medial foot culture grew methicillin sensitive staph aureus I gave him 10 days in total of doxycycline which should have covered this. 5/18-.Returns at 1 week, has been in 4 layer compression on the left, using alginate for the left leg and Prisma on the left foot READMISSION 6/25 He returns to clinic today with a 3 week history of an opening on the left anterior mid tibia area. He has been applying silver collagen to this. When he left our clinic we ordered him 30-40 over the toe stockings. He states his edema is under as good control in this leg as he is ever seen it. Over the last 2 days he has developed an area on the left medial ankle/foot. This is the area that we worked on last time they cultured staph I gave him antibiotics and this closed over. The patient has a history of pulmonary embolism with an IVC filter. I  think he has chronic clot in the deep system is thigh although I'll need to recheck this. He is on chronic anticoagulation. 7/2; the area on the left mid tibia did not have a viable surface today somewhat surprising adherent black necrotic surface. Not even really eschared. No evidence of infection. We did silver alginate last week 7/9; left mid tibia somewhat improved surface and slightly smaller. Using Iodoflex. 7/16 left mid tibia. Continues to be slightly smaller with improved surface using Iodoflex. He did not see Dr. Trula Ore with regards to the CT scan on the right leg because his insurance did not approve it 7/23; left mid tibia. No change in surface area  however the wound surface looks better. He has another small area superiorly which is a small hyper granulated lesion. But this is of unclear etiology however it is defined is new this week. He also had significant bleeding reported by our intake nurse the exact source of this was unclear. 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 His original wound looks quite a bit better and how it every he is developed over the last 3 weeks a raised painful area just above the wound. I am not sure if this represents a primary cutaneous issue or a venous issue. He seems to have a palpable vein underneath this which is tender may represent superficial venous thrombosis. He is already on Xarelto. 8/6-Patient returns at 1 week for his left mid tibial wounds, the more proximal of the 2 wounds was raised painful area described last time, patient states this is less painful than before, he is almost completed his doxycycline, the wound area is larger but the wound itself is less painful. 8/13-Left anterior leg wound looks smaller and overall making good progress the other wound has healed. We are using Hydrofera Blue 8/20; the patient has 2 open areas. The original inferior area and the new area superiorly. He has been using Hydrofera Blue ready but the wounds overall look dry. He has not managed to get a CT scan approved and hence has not seen Dr. Trula Ore at [facility] 8/27 most of the patient's wound areas on the anterior look epithelialized. There is still some eschar and vulnerability here. He has been wearing to press stockings which should give him 30/40 mmHg compression. He also has compression pumps that belonged to his father. I think it would be reasonable to try and get him external compression pumps. He was not using the compression pumps daily and these certainly need to be used daily. Finally he was approved for his CT scan on the right  with follow-up with Dr. Trula Ore. We will asked Dr. Trula Ore to look at his left leg as well 9/3; the patient had 2 wound areas. Central tibial area of area is healed. Smaller wound superiorly still perhaps slightly open. I believe his CT scan on the right hip and pelvis areas on the 18th. That was ordered by Dr. Trula Ore. We have ordered him 30/40 stockings and are going to attempt to get him compression pumps 9/10-Patient returns at 1 week for the 2 small areas on his left anterior leg, these areas are healed. Patient is now going to going to the 30/40 stockings. His CT scan of his right leg hip and pelvis areas is scheduled 9/22 the patient was discharged from the clinic on 9/10. He tells me that he started developing pain on the left anterior tibial area and swelling  on Thursday of last week. By Saturday the swelling was so prominent that he was scared to take his stocking off because he would be able to get it back on. There was increased pain. He was seen in the emergency department at [facility] on 9/1. 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 He was also felt to have cellulitis. There was apparently not any acute clot. He I he was put on a 3 time a day medication/antibiotic which I am assuming is clindamycin. He has 2 reopened areas both on the anterior tibia 9/29; he completed the clindamycin even though it caused diarrhea. The diarrhea is resolved. The cellulitis in the left leg that was prominent last week has resolved. 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 I looked in [place] link I do not see the actual CT scan report. He is being apparently scheduled for an attempt at stenting retrograde and  then if that does not work anterior grade by Dr. Trula Ore. We use silver alginate to the wound last week because of coexistent cellulitis 10/6; cellulitis on the left leg resolved. He is still waiting for insurance authorization for his pelvic vein stenting. Changed him to [facility] last week 10/13; cellulitis remains resolved. Wound is smaller. Using Hydrofera Blue under compression. In 2 days time he is going for his venous stenting hopefully by Dr. Trula Ore at [facility] 10/20; the patient went for his procedure last Thursday by Dr. Trula Ore although he does not remember in the postop period what he was told. He is not certain whether Dr. Trula Ore managed to get the stent then successfully. His wound is measuring smaller looks healthy on the left mid tibia. He is also having some discomfort behind his left knee that he had me look at which is the site where Dr. Trula Ore had venous access. I looked in care everywhere. I could not see a specific note from Dr. Trula Ore [[facility]] above the actual procedure. Presumably it is still in transfer therefore I could not really answer his question about whether the stent was placed successful 10/27; wound not quite as good as last week. Using Hydrofera Blue. He thinks it might of stuck to the wound and was adherent when they took it off today. His procedure with Dr. Trula Ore was not successful. He thinks that Dr. Trula Ore will try to go at this from the other direction at some point. He sees him in 2- 1/2-week 11/3; quite an improvement in wound surface area this week. We are using Hydrofera Blue. No need to change the dressing. Follows up with Dr. Trula Ore in about a week and a half 11/10; wound surface not as good this week at all. No changes in dimensions. We have been using Hydrofera Blue. As well he had tells [place] he had discomfort in his foot all week although he admits he was up on this more than usual. He comes in today with 2 small punched out areas on the left  lateral foot. He has severe venous hypertension. He sees Dr. Trula Ore again on 11/16 11/17; not much change in the area on the anterior tibia mid aspect. He also has 2 areas on the left lateral foot. We  have been using Sorbact on the left anterior and Iodosorb ointment on the left foot. He is under 4-layer compression. The patient went to see Dr. Trula Ore I do not yet have access to this note and care everywhere. However according to the patient there is an option to try and address his venous occlusion I think via a transjugular approach. This is complicated by the fact that the IVC filter is there and would have to be traversed. According to the patient there is about a 33% chance of perforation may be even aortic perforation. However they would be prepared for this. He is thinking about this and discussing it with his wife Thanks to our case manager this week we are able to determine if for some reason the patient is not using his compression pumps. He also is not able to get stockings on the right leg which are 30/40 below-knee. I think we may need to order external compression garments 12/1; patient arrives with the area on the left anterior mid tibia expanded superiorly small rim of skin between the original area and the new area. He has painful small denuded areas on the left medial and left lateral heel. We have been using Iodoflex. His next appointment with the Dr. Trula Ore is January 4. after the holidays to discuss if he would like to pursue this endovascular option. Dr. Sherrin Daisy notes below from last visit Subjective Rik Wadel is a 44 y.o. 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 Unfortunately during the procedure a stent was not able to be placed d/t  narrowing at the IVC. Per the patient the procedure provided 2-3 days of symptomatic relief (decrease in L LE pressure and pain) but after that his symptoms returned with increased pressure, pain in the hips and pain in the legs when standing. The patient does report his L LE wound is stable especially when he is consistently attending his wound care clinic in [place]. The patient denies using compressive LE therapies unless done at his wound clinic due to not being able to reach his legs. He is interested in learning more and possibly pursing further interventional options for his L lower extremity via access through neck. *Objective Physical Exam: BP 171/86  Pulse 80  T emp 37 C (T emporal)  Wt (!) 147.4 kg (325 lb)  BMI 41.73 kg/m General: awake, alert and oriented x 3 Chest: Non labored breathing on room air CVS: Normal rate. Regular rhythm. ABD: soft, NT ND, Ext: bilateral leg edema w/ left leg in compressive dressing from wound clinic. Evidence of skin discoloration on both feet bilaterally. Data Review: Labs: None to review Imaging:Reviewed report of 10/15 L LE venogram. I saw and evaluated the patient, participating in the key portions of the service. I reviewed the residentoos note. I agree with the residentoos findings and plan. Electronically signed by Derry Skill, MD at 09/16/2019 10:53 AM EST 12/11 the areas around his ankles have healed. 2 areas in the center part of the mid tibia area are still open we have been using silver collagen. Procedure attempt by Dr. Trula Ore at [facility] on 11/03/2019 12/18; the wounds around his ankles remain closed. I think there has been some improvement in the 2 areas in the center part of the mid tibia.  Especially superiorly. Debris on the center required debridement we have been using silver collagen 12/31; I saw the patient briefly last week when he was here for a nurse visit. He had irritation in which look believed to be  localized cellulitis and folliculitis on the lateral left calf. I gave him a prescription for cephalexin although he admits today he never filled it. The area still looked very angry. His original wounds we have been treating where a figure-of-eight shaped area on the anterior tibial area mid aspect. We have been using silver collagen these do not look healthy. 11/07/2019. The patient has completed his antibiotics. The area of folliculitis on the left lateral calf looks somewhat better although there is still erythema there is no tenderness. I am going to put some more compression on this area and will have a look at this next week. No additional antibiotics. Since the patient was last here he went to see Dr. Trula Ore at [facility]. I am able to see his note from 11/03/2019 in care everywhere. The patient has a history of a right iliac vein stent and IVC filter placement. He is status post angioplasty of the left common iliac vein on 08/14/2019 for chronic obstruction and venous insufficiency. Unfortunately a stent could not be placed during the procedure due to narrowing at the IVC from fibrotic change of the right iliac vein stent. As I understand things they are now going to approach this from both sides i.e. through an area in the internal jugular vein as well as the more standard distal approach and see if this area can be stented. The thought was 5 to 6 weeks before this could be arranged as it will "require 2 surgeons, insurance approval etc. The original wound on his anterior tibia looks better. He has the area on the left lateral tibia with localized swelling. He has a new area on the left medial calcaneus which is small punched out and very painful. I think this looks similar to wounds he has had in this area before. He has severe venous hypertension 1/15; anterior tibia wound has come down from a figure-of-eight to 1 wound. Has some depth which looks clean. The area on the left lateral calf looks  better. We put additional compression in here and the edema in this area looks better. His major complaint is on the medial calcaneus. He had mentioned this last week and he has had wounds like this before. Pain is severe 1/22; anterior tibial wound seems to be closing at with shallower and better looking wound margin. He has an area anteriorly that I think was initially an area of folliculitis the area on the left lateral calf has closed over The small area on the medial heel that I cultured last week grew methicillin sensitive staph aureus but resistant to the doxycycline I gave him. I substituted cephalexin 500 every 6 and has only been on this for 2 days. Still says the pain is very significant 1/29; all the patient's wounds look quite good today including the anterior tibial which is closing in. The area above this also closing in finally the very painful area on the medial heel. He is completing antibiotics today this cultured methicillin sensitive staph aureus. Back to using silver alginate on the wounds 2/12; in general things are quite good here. He still does not have a date for attempting to stand his left common iliac vein. Still an insurance issue. I have been encouraged him to move on this by calling  his insurance company. There is not a good option here. 2/26; continued nice improvement. He still has the open area in the mid tibia that is incompletely closed. Surprisingly today the difficult area on his left medial calcaneus that was so painful and inflamed has epithelialized over. 3/5; the patient has his stenting procedure attempt at [facility] on 3/25. He continues to make nice progress the only remaining wound is on the left anterior tibial area. His heel medially remains epithelialized. We have been using Sorbac 3/19; his stenting procedure is actually on 4/15 per the patient today. Unfortunately he was not here last week and did not use his pumps. He has an area of localized edema  erythema and marked tenderness above where the patient claims the original wound was. T be truthful we all had trouble determining what was o new wound today and what was original wound. Patient thought it was the more distal medial area where is the more central area is what my nurse and I thought it was. In any case looking back on the pictures really did not clarify things. He has a small punched out area just medial to the tibia which the patient claims was his original wound superiorly he now has an open area which is superficial some satellite lesions very significant erythema and tenderness more centrally and superiorly to what is supposedly his original wound. A lot of tenderness here. We have been using Sorbact I changed to silver alginate today. He will need antibiotic 3/22; back early for review of probable cellulitis in the mid tibia area. This also could have been localized stasis dermatitis from not changing his wrap and not using compression. We put him on doxycycline empirically things look better he is in less pain no systemic illness 3/26; in for his usual appointment. The cellulitis/localized stasis dermatitis we are concerned about a week ago has resolved. He is completing the doxycycline. I really think this was localized poor edema control rather than cellulitis although these things are difficult to be certain. Will use silver alginate on the remaining wound which looks healthy on the left anterior mid tibia 4/9; the cellulitis stasis dermatitis week concerned about 2 weeks ago is still resolved. His original wound in the mid tibia area is remains healed the area we are dealing with current currently is above this area by about 3 or 4 cm. Clean looking wound. No need for debridement. We have been using silver alginate The patient's attempt to recanalize his left common iliac vein is next Thursday. This is to be done at [facility] by Dr. Trula Ore and colleagues my understanding is that  they will approach this from both sides 4/30; the patient underwent an extensive venous revascularization by Dr. Trula Ore at [facility] on 02/12/2020 this included recanalization of the inferior vena cava, endovascular stenting of the inferior vena cava of the left common iliac vein the left iliac vein and the left femoral vein and venoplasty of the inferior vena cava left iliac vein common femoral vein right iliac vein and right femoral vein. The patient did well. He is now on a months worth of Lovenox and lieu of his Xarelto. He is not on any other antiplatelet agents. We have been wrapping his leg with 4-layer compression. According to our nurses he had a nurse visit last week and was closed he remains closed but is concerned about some degree of swelling in the left leg. He is not in any pain but says he is hypersensitive 5/7; the  patient is status post an extensive venous revascularization by Dr. Trula Ore at [facility]. This included recanalization of the inferior vena cava, endovascular stenting of the left common iliac vein left iliac vein and left femoral vein. The patient has done very well. His wounds are all closed. He has not been able to get into his 30/40 below-knee stockings however he has extremitease stockings at home and I have suggested these. He also has compression pumps and I have asked him to consider using these once a day and monitor the swelling in his lower extremity Readmission: 05/28/2020 on evaluation today patient presents for readmission here in the clinic concerning issues that he has been having currently with a reopening of his wound over the past 1-2 weeks [place] the [place] and tells me that he was not wearing his compression regularly like he is supposed to. He states that subsequently his leg began to swell and then subsequently following this he had wounds that open. Fortunately there is no signs of active infection at this time which is good news. He does tell me it  appeared to be somewhat infected at one point but he thinks it was just fluid and drainage and not true active infection. Nonetheless he does have overall worsening with regard to the open wound at this time in the past he is done well with Hydrofera Blue than when he had less drainage we had switched to Sorbact. 06/04/2020 upon evaluation today patient presents for follow-up concerning his leg ulcer. He has been tolerating the dressing changes without complication. Fortunately there is no signs of active infection at this time which is great news. No fevers, chills, nausea, vomiting, or diarrhea. 06/10/20-Returns at 1 week under 4 layer compression, Hydrofera blue, with 1 open area closed up and the remaining on left anterior leg smaller 8/19; I have not seen this patient since he returned. Is a patient with severe chronic venous insufficiency with central venous obstruction status post left common iliac vein stenting. This certainly helped and healed his wounds on the anterior leg and medial ankle the last time. He returned to clinic saying that he had been at the beach. He is compliant with his juxta lite stocking but was more active on his feet developed increasing edema and an opening on the left anterior mid tibia area. He is back in compression using Hydrofera Blue. There is a threatened area just above the wound laterally today. Other than that the wound itself does not look too bad 8/26; the patient's major wound in the mid tibial area is better. He had a blister last week that we dressed with alginate just above this area that is open this week. Finally he has an area on the medial ankle which he says was a scab that was come off. Left a pinpoint wound. He has I think very significant venous hypertension in the medial ankle area with very dilated small venules. This is been a very problematic area for him in the past 9/3; the area in the left mid tibia is just about closed. 2 small open areas  remain after a vigorous debridement. Fortunately has no additional problems in the left medial ankle and foot which have been problematic in the past. 9/9; the area on the left mid tibia is closed he does not have any open areas in this problematic area the other area is the left medial heel/ankle there is no open area here either. His edema is reasonably well controlled. Objective  Constitutional Patient is hypertensive.. Pulse regular and within target range for patient.Marland Kitchen. Respirations regular, non-labored and within target range.. Temperature is normal and within the target range for the patient.Marland Kitchen. Appears in no distress. Vitals Time Taken: 8:06 AM, Height: 74 in, Weight: 335 lbs, BMI: 43, Temperature: 98.8 F, Pulse: 83 bpm, Respiratory Rate: 17 breaths/min, Blood Pressure: 153/88 mmHg. Cardiovascular Pedal pulses are palpable. He has no undue edema in his thigh and the edema in the left leg appears to be stable to improved. General Notes: Wound exam; left anterior leg. No open areas here. The edema is well controlled. He has severe chronic hemosiderin deposition probably with some degree of ongoing stasis dermatitis however he has no open wounds. The problematic area on the left medial ankle and heel also is not open. The skin appears dry and flaking. Hemosiderin deposition otherwise no abnormalities Integumentary (Hair, Skin) Wound #18 status is Healed - Epithelialized. Original cause of wound was Gradually Appeared. The wound is located on the Left,Anterior Lower Leg. The wound measures 0cm length x 0cm width x 0cm depth; 0cm^2 area and 0cm^3 volume. There is no tunneling or undermining noted. There is a none present amount of drainage noted. The wound margin is flat and intact. There is no granulation within the wound bed. There is no necrotic tissue within the wound bed. Assessment Active Problems ICD-10 Lymphedema, not elsewhere classified Chronic venous hypertension (idiopathic) with  inflammation of left lower extremity Non-pressure chronic ulcer of other part of left lower leg with fat layer exposed Other thrombophilia Cellulitis of left lower limb [facility] Edema Control: Support Garment 30-40 mm/Hg pressure to: - wear stockings daily and pump at least twice a day if possible 1. Recurrent wounds in the setting of severe chronic venous insufficiency with central venous obstruction now stented 2. He has 30/40 below-knee stockings and compression pumps. I have recommended continuing this and using the compression pumps at least once a day perhaps twice 3. He will also need to lubricate his skin every day and keep his leg elevated he is aware of all this. 4. I have gone over all the basics with him again. This latest breakdown [place] not by his own admission keep up with all of the above Electronic Signature(s) Signed: 07/12/2020 7:26:40 PM By: Baltazar Najjarobson, Mimie Goering MD Entered By: Baltazar Najjarobson, Saralynn Langhorst on 07/09/2020 08:54:35 -------------------------------------------------------------------------------- SuperBill Details Patient Name: Date of Service: [name] HoughBO BE, [name] 07/09/2020 Medical Record Number: [id number] Patient Account Number: [id number] Date of Birth/Sex: Treating RN: [birth year] (44 y.o. [name] RightM) Dwiggins, Shannon Primary Care Provider: Docia ChuckKoirala, [name] Other Clinician: Referring Provider: Treating Provider/Extender: Carola Rhineobson, Abdi Husak Koirala, [name] in Treatment: 6 Diagnosis Coding ICD-10 Codes Code Description I89.0 Lymphedema, not elsewhere classified I87.322 Chronic venous hypertension (idiopathic) with inflammation of left lower extremity L97.822 Non-pressure chronic ulcer of other part of left lower leg with fat layer exposed D68.69 Other thrombophilia L03.116 Cellulitis of left lower limb Facility Procedures CPT4 Code: [id number] Description: 99213 - WOUND CARE VISIT-LEV 3 EST  PT Modifier: Quantity: 1 Physician Procedures : CPT4 Code Description Modifier [id number] 99213 - WC PHYS LEVEL 3 - EST PT ICD-10 Diagnosis Description I89.0 Lymphedema, not elsewhere classified I87.322 Chronic venous hypertension (idiopathic) with inflammation of left lower extremity L97.822  Non-pressure chronic ulcer of other part of left lower leg with fat layer exposed D68.69 Other thrombophilia Quantity: 1 Electronic Signature(s) Signed: 07/12/2020 7:26:40 PM By: Baltazar Najjarobson, Iracema Lanagan MD Entered By:  Baltazar Najjar on 07/09/2020 08:54:56

## 2020-07-19 ENCOUNTER — Other Ambulatory Visit: Payer: Self-pay | Admitting: Pain Medicine

## 2020-07-19 DIAGNOSIS — M25551 Pain in right hip: Secondary | ICD-10-CM

## 2020-07-19 DIAGNOSIS — G8929 Other chronic pain: Secondary | ICD-10-CM

## 2020-09-25 ENCOUNTER — Ambulatory Visit
Admission: RE | Admit: 2020-09-25 | Discharge: 2020-09-25 | Disposition: A | Discharge status: Home / Self Care | Payer: 59 | Source: Ambulatory Visit | Attending: Pain Medicine | Admitting: Pain Medicine

## 2020-09-25 ENCOUNTER — Other Ambulatory Visit: Payer: Self-pay

## 2020-09-25 DIAGNOSIS — M25551 Pain in right hip: Secondary | ICD-10-CM

## 2020-09-25 DIAGNOSIS — G8929 Other chronic pain: Secondary | ICD-10-CM
